# Patient Record
Sex: Female | Born: 1935 | Race: White | Hispanic: No | Smoking: Never smoker
Health system: Southern US, Community
[De-identification: ages and names within clinical notes are randomized; demographics above are authoritative.]

## PROBLEM LIST (undated history)

## (undated) DIAGNOSIS — R202 Paresthesia of skin: Secondary | ICD-10-CM

## (undated) DIAGNOSIS — I359 Nonrheumatic aortic valve disorder, unspecified: Secondary | ICD-10-CM

## (undated) DIAGNOSIS — S99929A Unspecified injury of unspecified foot, initial encounter: Secondary | ICD-10-CM

## (undated) DIAGNOSIS — Z7901 Long term (current) use of anticoagulants: Secondary | ICD-10-CM

## (undated) DIAGNOSIS — D721 Eosinophilia, unspecified: Secondary | ICD-10-CM

## (undated) DIAGNOSIS — R32 Unspecified urinary incontinence: Secondary | ICD-10-CM

## (undated) DIAGNOSIS — Z853 Personal history of malignant neoplasm of breast: Secondary | ICD-10-CM

## (undated) DIAGNOSIS — L03115 Cellulitis of right lower limb: Secondary | ICD-10-CM

## (undated) DIAGNOSIS — C50919 Malignant neoplasm of unspecified site of unspecified female breast: Secondary | ICD-10-CM

## (undated) DIAGNOSIS — M51369 Other intervertebral disc degeneration, lumbar region without mention of lumbar back pain or lower extremity pain: Secondary | ICD-10-CM

## (undated) DIAGNOSIS — K429 Umbilical hernia without obstruction or gangrene: Secondary | ICD-10-CM

## (undated) DIAGNOSIS — S72002A Fracture of unspecified part of neck of left femur, initial encounter for closed fracture: Secondary | ICD-10-CM

## (undated) DIAGNOSIS — I4891 Unspecified atrial fibrillation: Secondary | ICD-10-CM

## (undated) DIAGNOSIS — G8929 Other chronic pain: Secondary | ICD-10-CM

## (undated) DIAGNOSIS — M1710 Unilateral primary osteoarthritis, unspecified knee: Secondary | ICD-10-CM

## (undated) DIAGNOSIS — K219 Gastro-esophageal reflux disease without esophagitis: Secondary | ICD-10-CM

## (undated) DIAGNOSIS — M179 Osteoarthritis of knee, unspecified: Secondary | ICD-10-CM

## (undated) DIAGNOSIS — K8020 Calculus of gallbladder without cholecystitis without obstruction: Secondary | ICD-10-CM

## (undated) DIAGNOSIS — M5136 Other intervertebral disc degeneration, lumbar region: Secondary | ICD-10-CM

## (undated) DIAGNOSIS — E2740 Unspecified adrenocortical insufficiency: Secondary | ICD-10-CM

## (undated) DIAGNOSIS — M7120 Synovial cyst of popliteal space [Baker], unspecified knee: Secondary | ICD-10-CM

## (undated) DIAGNOSIS — I482 Chronic atrial fibrillation, unspecified: Secondary | ICD-10-CM

## (undated) DIAGNOSIS — I10 Essential (primary) hypertension: Secondary | ICD-10-CM

## (undated) DIAGNOSIS — M4850XA Collapsed vertebra, not elsewhere classified, site unspecified, initial encounter for fracture: Secondary | ICD-10-CM

## (undated) DIAGNOSIS — G4730 Sleep apnea, unspecified: Secondary | ICD-10-CM

## (undated) DIAGNOSIS — I639 Cerebral infarction, unspecified: Secondary | ICD-10-CM

## (undated) DIAGNOSIS — R55 Syncope and collapse: Secondary | ICD-10-CM

## (undated) DIAGNOSIS — K3184 Gastroparesis: Secondary | ICD-10-CM

## (undated) DIAGNOSIS — E785 Hyperlipidemia, unspecified: Secondary | ICD-10-CM

## (undated) DIAGNOSIS — K5790 Diverticulosis of intestine, part unspecified, without perforation or abscess without bleeding: Secondary | ICD-10-CM

## (undated) HISTORY — DX: Essential (primary) hypertension: I10

## (undated) HISTORY — DX: Malignant neoplasm of unspecified site of unspecified female breast: C50.919

## (undated) HISTORY — PX: BREAST SURGERY: SHX581

## (undated) HISTORY — DX: Unilateral primary osteoarthritis, unspecified knee: M17.10

## (undated) HISTORY — DX: Unspecified urinary incontinence: R32

## (undated) HISTORY — DX: Long term (current) use of anticoagulants: Z79.01

## (undated) HISTORY — PX: CARDIAC VALVE REPLACEMENT: SHX585

## (undated) HISTORY — PX: BREAST LUMPECTOMY: SHX2

## (undated) HISTORY — PX: EXPLORATION POST OPERATIVE OPEN HEART: SHX5061

## (undated) HISTORY — DX: Hyperlipidemia, unspecified: E78.5

## (undated) HISTORY — DX: Eosinophilia, unspecified: D72.10

## (undated) HISTORY — DX: Nonrheumatic aortic valve disorder, unspecified: I35.9

## (undated) HISTORY — PX: MASTECTOMY: SHX3

## (undated) HISTORY — PX: COLONOSCOPY: SHX174

## (undated) HISTORY — DX: Unspecified atrial fibrillation: I48.91

## (undated) HISTORY — DX: Sleep apnea, unspecified: G47.30

## (undated) HISTORY — DX: Gastro-esophageal reflux disease without esophagitis: K21.9

## (undated) HISTORY — DX: Osteoarthritis of knee, unspecified: M17.9

## (undated) HISTORY — DX: Eosinophilia: D72.1

## (undated) HISTORY — DX: Cerebral infarction, unspecified: I63.9

## (undated) SURGERY — EGD (ESOPHAGOGASTRODUODENOSCOPY)
Anesthesia: Moderate Sedation

---

## 1998-01-08 ENCOUNTER — Encounter: Admission: RE | Admit: 1998-01-08 | Discharge: 1998-01-08 | Payer: Self-pay | Admitting: Family Medicine

## 1998-01-11 ENCOUNTER — Encounter: Admission: RE | Admit: 1998-01-11 | Discharge: 1998-01-11 | Payer: Self-pay | Admitting: Family Medicine

## 1998-01-26 ENCOUNTER — Encounter: Admission: RE | Admit: 1998-01-26 | Discharge: 1998-01-26 | Payer: Self-pay | Admitting: Family Medicine

## 1998-01-29 ENCOUNTER — Encounter: Admission: RE | Admit: 1998-01-29 | Discharge: 1998-01-29 | Payer: Self-pay | Admitting: Family Medicine

## 1998-02-05 ENCOUNTER — Encounter: Admission: RE | Admit: 1998-02-05 | Discharge: 1998-02-05 | Payer: Self-pay | Admitting: Family Medicine

## 1998-03-01 ENCOUNTER — Encounter: Admission: RE | Admit: 1998-03-01 | Discharge: 1998-03-01 | Payer: Self-pay | Admitting: Family Medicine

## 1998-03-11 ENCOUNTER — Encounter: Admission: RE | Admit: 1998-03-11 | Discharge: 1998-03-11 | Payer: Self-pay | Admitting: Sports Medicine

## 1998-03-15 ENCOUNTER — Encounter: Admission: RE | Admit: 1998-03-15 | Discharge: 1998-03-15 | Payer: Self-pay | Admitting: Family Medicine

## 1998-04-01 ENCOUNTER — Encounter: Admission: RE | Admit: 1998-04-01 | Discharge: 1998-04-01 | Payer: Self-pay | Admitting: Family Medicine

## 1998-04-23 ENCOUNTER — Encounter: Admission: RE | Admit: 1998-04-23 | Discharge: 1998-04-23 | Payer: Self-pay | Admitting: Family Medicine

## 1998-05-07 ENCOUNTER — Encounter: Admission: RE | Admit: 1998-05-07 | Discharge: 1998-08-05 | Payer: Self-pay | Admitting: *Deleted

## 1998-05-14 ENCOUNTER — Encounter: Admission: RE | Admit: 1998-05-14 | Discharge: 1998-05-14 | Payer: Self-pay | Admitting: Family Medicine

## 1998-05-27 ENCOUNTER — Encounter: Admission: RE | Admit: 1998-05-27 | Discharge: 1998-05-27 | Payer: Self-pay | Admitting: Family Medicine

## 1998-05-28 ENCOUNTER — Encounter: Admission: RE | Admit: 1998-05-28 | Discharge: 1998-05-28 | Payer: Self-pay | Admitting: Family Medicine

## 1998-05-31 ENCOUNTER — Encounter: Admission: RE | Admit: 1998-05-31 | Discharge: 1998-05-31 | Payer: Self-pay | Admitting: Family Medicine

## 1998-06-04 ENCOUNTER — Encounter: Admission: RE | Admit: 1998-06-04 | Discharge: 1998-06-04 | Payer: Self-pay | Admitting: Family Medicine

## 1998-06-18 ENCOUNTER — Encounter: Admission: RE | Admit: 1998-06-18 | Discharge: 1998-06-18 | Payer: Self-pay | Admitting: Family Medicine

## 1998-06-29 ENCOUNTER — Encounter: Admission: RE | Admit: 1998-06-29 | Discharge: 1998-06-29 | Payer: Self-pay | Admitting: Family Medicine

## 1998-07-02 ENCOUNTER — Encounter: Admission: RE | Admit: 1998-07-02 | Discharge: 1998-07-02 | Payer: Self-pay | Admitting: Family Medicine

## 1998-07-09 ENCOUNTER — Encounter: Admission: RE | Admit: 1998-07-09 | Discharge: 1998-07-09 | Payer: Self-pay | Admitting: Family Medicine

## 1998-07-16 ENCOUNTER — Encounter: Admission: RE | Admit: 1998-07-16 | Discharge: 1998-07-16 | Payer: Self-pay | Admitting: Family Medicine

## 1998-07-19 ENCOUNTER — Encounter: Admission: RE | Admit: 1998-07-19 | Discharge: 1998-07-19 | Payer: Self-pay | Admitting: Family Medicine

## 1998-08-03 ENCOUNTER — Encounter: Admission: RE | Admit: 1998-08-03 | Discharge: 1998-08-03 | Payer: Self-pay | Admitting: Family Medicine

## 1998-08-06 ENCOUNTER — Encounter: Admission: RE | Admit: 1998-08-06 | Discharge: 1998-08-06 | Payer: Self-pay | Admitting: Family Medicine

## 1998-08-24 ENCOUNTER — Encounter: Admission: RE | Admit: 1998-08-24 | Discharge: 1998-08-24 | Payer: Self-pay | Admitting: Sports Medicine

## 1998-08-25 ENCOUNTER — Encounter: Admission: RE | Admit: 1998-08-25 | Discharge: 1998-08-25 | Payer: Self-pay | Admitting: Family Medicine

## 1998-09-27 ENCOUNTER — Encounter: Admission: RE | Admit: 1998-09-27 | Discharge: 1998-09-27 | Payer: Self-pay | Admitting: Specialist

## 1998-10-12 ENCOUNTER — Encounter: Admission: RE | Admit: 1998-10-12 | Discharge: 1998-10-12 | Payer: Self-pay | Admitting: Sports Medicine

## 1998-10-29 ENCOUNTER — Encounter: Admission: RE | Admit: 1998-10-29 | Discharge: 1998-10-29 | Payer: Self-pay | Admitting: Family Medicine

## 1998-11-15 ENCOUNTER — Encounter: Admission: RE | Admit: 1998-11-15 | Discharge: 1998-11-15 | Payer: Self-pay | Admitting: Sports Medicine

## 1998-11-29 ENCOUNTER — Encounter: Admission: RE | Admit: 1998-11-29 | Discharge: 1998-11-29 | Payer: Self-pay | Admitting: Family Medicine

## 1998-12-03 ENCOUNTER — Encounter: Admission: RE | Admit: 1998-12-03 | Discharge: 1998-12-03 | Payer: Self-pay | Admitting: Family Medicine

## 1998-12-29 ENCOUNTER — Encounter: Admission: RE | Admit: 1998-12-29 | Discharge: 1998-12-29 | Payer: Self-pay | Admitting: Family Medicine

## 1999-01-12 ENCOUNTER — Encounter: Admission: RE | Admit: 1999-01-12 | Discharge: 1999-01-12 | Payer: Self-pay | Admitting: Family Medicine

## 1999-01-17 ENCOUNTER — Encounter: Admission: RE | Admit: 1999-01-17 | Discharge: 1999-01-17 | Payer: Self-pay | Admitting: Sports Medicine

## 1999-01-24 ENCOUNTER — Encounter: Admission: RE | Admit: 1999-01-24 | Discharge: 1999-01-24 | Payer: Self-pay | Admitting: Sports Medicine

## 1999-02-02 ENCOUNTER — Encounter: Admission: RE | Admit: 1999-02-02 | Discharge: 1999-02-02 | Payer: Self-pay | Admitting: Family Medicine

## 1999-03-18 ENCOUNTER — Encounter: Admission: RE | Admit: 1999-03-18 | Discharge: 1999-03-18 | Payer: Self-pay | Admitting: Family Medicine

## 1999-04-01 ENCOUNTER — Encounter: Admission: RE | Admit: 1999-04-01 | Discharge: 1999-04-01 | Payer: Self-pay | Admitting: Family Medicine

## 1999-04-04 ENCOUNTER — Encounter: Admission: RE | Admit: 1999-04-04 | Discharge: 1999-04-04 | Payer: Self-pay | Admitting: Family Medicine

## 1999-04-11 ENCOUNTER — Encounter: Admission: RE | Admit: 1999-04-11 | Discharge: 1999-04-11 | Payer: Self-pay | Admitting: Family Medicine

## 1999-05-05 ENCOUNTER — Encounter: Admission: RE | Admit: 1999-05-05 | Discharge: 1999-05-05 | Payer: Self-pay | Admitting: Family Medicine

## 1999-05-30 ENCOUNTER — Encounter: Admission: RE | Admit: 1999-05-30 | Discharge: 1999-05-30 | Payer: Self-pay | Admitting: Family Medicine

## 1999-06-16 ENCOUNTER — Encounter: Admission: RE | Admit: 1999-06-16 | Discharge: 1999-06-16 | Payer: Self-pay | Admitting: Family Medicine

## 1999-07-01 ENCOUNTER — Encounter: Admission: RE | Admit: 1999-07-01 | Discharge: 1999-07-01 | Payer: Self-pay | Admitting: Family Medicine

## 1999-07-27 ENCOUNTER — Encounter: Admission: RE | Admit: 1999-07-27 | Discharge: 1999-07-27 | Payer: Self-pay | Admitting: Family Medicine

## 1999-08-01 ENCOUNTER — Encounter: Admission: RE | Admit: 1999-08-01 | Discharge: 1999-08-01 | Payer: Self-pay | Admitting: Family Medicine

## 2001-10-30 ENCOUNTER — Encounter: Admission: RE | Admit: 2001-10-30 | Discharge: 2001-10-30 | Payer: Self-pay | Admitting: Family Medicine

## 2001-11-13 ENCOUNTER — Encounter: Payer: Self-pay | Admitting: Sports Medicine

## 2001-11-13 ENCOUNTER — Encounter: Admission: RE | Admit: 2001-11-13 | Discharge: 2001-11-13 | Payer: Self-pay | Admitting: Family Medicine

## 2001-11-13 ENCOUNTER — Encounter: Admission: RE | Admit: 2001-11-13 | Discharge: 2001-11-13 | Payer: Self-pay | Admitting: Sports Medicine

## 2001-12-11 ENCOUNTER — Encounter: Admission: RE | Admit: 2001-12-11 | Discharge: 2001-12-11 | Payer: Self-pay | Admitting: Family Medicine

## 2001-12-17 ENCOUNTER — Encounter: Admission: RE | Admit: 2001-12-17 | Discharge: 2001-12-17 | Payer: Self-pay | Admitting: Family Medicine

## 2002-05-07 ENCOUNTER — Encounter: Admission: RE | Admit: 2002-05-07 | Discharge: 2002-05-07 | Payer: Self-pay | Admitting: Family Medicine

## 2002-11-12 ENCOUNTER — Encounter: Admission: RE | Admit: 2002-11-12 | Discharge: 2002-11-12 | Payer: Self-pay | Admitting: Family Medicine

## 2002-11-14 ENCOUNTER — Encounter: Admission: RE | Admit: 2002-11-14 | Discharge: 2002-11-14 | Payer: Self-pay | Admitting: Family Medicine

## 2002-11-21 ENCOUNTER — Encounter: Admission: RE | Admit: 2002-11-21 | Discharge: 2002-11-21 | Payer: Self-pay | Admitting: Family Medicine

## 2002-11-26 ENCOUNTER — Encounter: Admission: RE | Admit: 2002-11-26 | Discharge: 2002-11-26 | Payer: Self-pay | Admitting: Sports Medicine

## 2002-11-26 ENCOUNTER — Encounter: Admission: RE | Admit: 2002-11-26 | Discharge: 2002-11-26 | Payer: Self-pay | Admitting: Family Medicine

## 2002-11-26 ENCOUNTER — Encounter: Payer: Self-pay | Admitting: Sports Medicine

## 2002-12-03 ENCOUNTER — Encounter: Admission: RE | Admit: 2002-12-03 | Discharge: 2002-12-03 | Payer: Self-pay | Admitting: Family Medicine

## 2002-12-12 ENCOUNTER — Encounter: Admission: RE | Admit: 2002-12-12 | Discharge: 2002-12-12 | Payer: Self-pay | Admitting: Family Medicine

## 2003-01-01 ENCOUNTER — Encounter: Admission: RE | Admit: 2003-01-01 | Discharge: 2003-01-01 | Payer: Self-pay | Admitting: Family Medicine

## 2003-01-08 ENCOUNTER — Encounter: Payer: Self-pay | Admitting: Sports Medicine

## 2003-01-08 ENCOUNTER — Encounter: Admission: RE | Admit: 2003-01-08 | Discharge: 2003-01-08 | Payer: Self-pay | Admitting: *Deleted

## 2003-01-19 ENCOUNTER — Encounter: Admission: RE | Admit: 2003-01-19 | Discharge: 2003-01-19 | Payer: Self-pay | Admitting: Family Medicine

## 2003-02-13 ENCOUNTER — Encounter: Admission: RE | Admit: 2003-02-13 | Discharge: 2003-02-13 | Payer: Self-pay | Admitting: Family Medicine

## 2003-02-27 ENCOUNTER — Encounter: Admission: RE | Admit: 2003-02-27 | Discharge: 2003-02-27 | Payer: Self-pay | Admitting: Family Medicine

## 2003-03-11 ENCOUNTER — Encounter: Admission: RE | Admit: 2003-03-11 | Discharge: 2003-03-11 | Payer: Self-pay | Admitting: Family Medicine

## 2003-04-03 ENCOUNTER — Encounter: Admission: RE | Admit: 2003-04-03 | Discharge: 2003-04-03 | Payer: Self-pay | Admitting: Family Medicine

## 2003-05-19 ENCOUNTER — Encounter: Admission: RE | Admit: 2003-05-19 | Discharge: 2003-05-19 | Payer: Self-pay | Admitting: Sports Medicine

## 2003-06-02 ENCOUNTER — Encounter: Admission: RE | Admit: 2003-06-02 | Discharge: 2003-06-02 | Payer: Self-pay | Admitting: Family Medicine

## 2003-06-16 ENCOUNTER — Encounter: Admission: RE | Admit: 2003-06-16 | Discharge: 2003-06-16 | Payer: Self-pay | Admitting: Sports Medicine

## 2006-04-28 ENCOUNTER — Ambulatory Visit: Payer: Self-pay | Admitting: Family Medicine

## 2006-10-25 ENCOUNTER — Emergency Department (HOSPITAL_COMMUNITY): Admission: EM | Admit: 2006-10-25 | Discharge: 2006-10-25 | Payer: Self-pay | Admitting: Emergency Medicine

## 2006-11-19 ENCOUNTER — Ambulatory Visit (HOSPITAL_COMMUNITY): Admission: RE | Admit: 2006-11-19 | Discharge: 2006-11-19 | Payer: Self-pay | Admitting: Family Medicine

## 2006-12-03 ENCOUNTER — Encounter (INDEPENDENT_AMBULATORY_CARE_PROVIDER_SITE_OTHER): Payer: Self-pay | Admitting: *Deleted

## 2006-12-03 ENCOUNTER — Encounter: Admission: RE | Admit: 2006-12-03 | Discharge: 2006-12-03 | Payer: Self-pay | Admitting: Family Medicine

## 2006-12-03 ENCOUNTER — Encounter (INDEPENDENT_AMBULATORY_CARE_PROVIDER_SITE_OTHER): Payer: Self-pay | Admitting: Diagnostic Radiology

## 2006-12-11 ENCOUNTER — Encounter: Admission: RE | Admit: 2006-12-11 | Discharge: 2006-12-11 | Payer: Self-pay | Admitting: Family Medicine

## 2006-12-12 ENCOUNTER — Encounter (INDEPENDENT_AMBULATORY_CARE_PROVIDER_SITE_OTHER): Payer: Self-pay | Admitting: *Deleted

## 2006-12-13 ENCOUNTER — Inpatient Hospital Stay (HOSPITAL_COMMUNITY): Admission: RE | Admit: 2006-12-13 | Discharge: 2006-12-14 | Payer: Self-pay | Admitting: General Surgery

## 2007-01-09 ENCOUNTER — Ambulatory Visit (HOSPITAL_COMMUNITY): Payer: Self-pay | Admitting: Oncology

## 2007-01-10 ENCOUNTER — Ambulatory Visit: Payer: Self-pay | Admitting: Radiation Oncology

## 2007-01-23 ENCOUNTER — Ambulatory Visit: Payer: Self-pay | Admitting: Radiation Oncology

## 2007-01-29 ENCOUNTER — Ambulatory Visit: Admission: RE | Admit: 2007-01-29 | Discharge: 2007-03-11 | Payer: Self-pay | Admitting: Radiation Oncology

## 2007-01-30 ENCOUNTER — Ambulatory Visit: Payer: Self-pay | Admitting: Cardiology

## 2007-03-01 ENCOUNTER — Ambulatory Visit: Payer: Self-pay | Admitting: Cardiology

## 2007-03-05 ENCOUNTER — Emergency Department (HOSPITAL_COMMUNITY): Admission: EM | Admit: 2007-03-05 | Discharge: 2007-03-05 | Payer: Self-pay | Admitting: Emergency Medicine

## 2007-03-22 ENCOUNTER — Ambulatory Visit: Payer: Self-pay | Admitting: Cardiovascular Disease

## 2007-04-02 ENCOUNTER — Encounter (HOSPITAL_COMMUNITY): Admission: RE | Admit: 2007-04-02 | Discharge: 2007-05-02 | Payer: Self-pay | Admitting: Orthopaedic Surgery

## 2007-04-03 ENCOUNTER — Encounter (HOSPITAL_COMMUNITY): Admission: RE | Admit: 2007-04-03 | Discharge: 2007-05-03 | Payer: Self-pay | Admitting: Oncology

## 2007-04-03 ENCOUNTER — Ambulatory Visit (HOSPITAL_COMMUNITY): Payer: Self-pay | Admitting: Oncology

## 2007-04-03 LAB — CONVERTED CEMR LAB
ALT: 18 U/L
AST: 25 U/L
Albumin: 3.8 g/dL
Alkaline Phosphatase: 88 U/L
BUN: 7 mg/dL
Basophils Absolute: 0.1 K/uL
Basophils Relative: 1 %
CO2: 25 meq/L
Calcium: 9 mg/dL
Chloride: 92 meq/L
Creatinine, Ser: 0.53 mg/dL
Eosinophils Absolute: 1.4 K/uL
Eosinophils Relative: 11 %
Glucose, Bld: 117 mg/dL
HCT: 37.2 %
Hemoglobin: 12.9 g/dL
Lymphocytes Relative: 15 %
Lymphs Abs: 1.9 K/uL
MCHC: 34.7 g/dL
MCV: 83.4 fL
Monocytes Absolute: 0.7 K/uL
Monocytes Relative: 6 %
Neutro Abs: 8.7 K/uL
Neutrophils Relative %: 68 %
Platelets: 373 K/uL
Potassium: 3.4 meq/L
RBC: 4.46 M/uL
RDW: 13.5 %
Sodium: 128 meq/L
Total Bilirubin: 0.6 mg/dL
Total Protein: 6.9 g/dL
WBC: 12.7 10*3/microliter

## 2007-04-17 ENCOUNTER — Ambulatory Visit (HOSPITAL_COMMUNITY): Admission: RE | Admit: 2007-04-17 | Discharge: 2007-04-17 | Payer: Self-pay | Admitting: Oncology

## 2007-04-24 ENCOUNTER — Ambulatory Visit: Payer: Self-pay | Admitting: Cardiovascular Disease

## 2007-05-10 ENCOUNTER — Ambulatory Visit: Payer: Self-pay | Admitting: Cardiology

## 2007-05-13 ENCOUNTER — Ambulatory Visit: Payer: Self-pay | Admitting: Cardiology

## 2007-05-20 ENCOUNTER — Ambulatory Visit: Payer: Self-pay | Admitting: Cardiology

## 2007-06-05 ENCOUNTER — Ambulatory Visit: Payer: Self-pay | Admitting: Cardiology

## 2007-06-26 ENCOUNTER — Ambulatory Visit: Payer: Self-pay | Admitting: Cardiology

## 2007-07-17 ENCOUNTER — Other Ambulatory Visit: Admission: RE | Admit: 2007-07-17 | Discharge: 2007-07-17 | Payer: Self-pay | Admitting: Obstetrics & Gynecology

## 2007-08-15 ENCOUNTER — Ambulatory Visit: Payer: Self-pay | Admitting: Cardiology

## 2007-09-09 ENCOUNTER — Ambulatory Visit: Payer: Self-pay | Admitting: Cardiology

## 2007-10-09 ENCOUNTER — Ambulatory Visit: Payer: Self-pay | Admitting: Cardiology

## 2007-10-30 ENCOUNTER — Ambulatory Visit (HOSPITAL_COMMUNITY): Admission: RE | Admit: 2007-10-30 | Discharge: 2007-10-30 | Payer: Self-pay | Admitting: Cardiology

## 2007-10-30 ENCOUNTER — Encounter (INDEPENDENT_AMBULATORY_CARE_PROVIDER_SITE_OTHER): Payer: Self-pay | Admitting: Internal Medicine

## 2007-10-30 ENCOUNTER — Ambulatory Visit: Payer: Self-pay | Admitting: Cardiology

## 2007-10-31 ENCOUNTER — Ambulatory Visit: Payer: Self-pay | Admitting: Cardiology

## 2007-11-27 ENCOUNTER — Ambulatory Visit (HOSPITAL_COMMUNITY): Admission: RE | Admit: 2007-11-27 | Discharge: 2007-11-27 | Payer: Self-pay | Admitting: Family Medicine

## 2007-12-11 ENCOUNTER — Ambulatory Visit: Payer: Self-pay | Admitting: Cardiology

## 2007-12-12 ENCOUNTER — Encounter (INDEPENDENT_AMBULATORY_CARE_PROVIDER_SITE_OTHER): Payer: Self-pay | Admitting: Internal Medicine

## 2008-01-09 ENCOUNTER — Ambulatory Visit: Payer: Self-pay | Admitting: Cardiology

## 2008-01-22 ENCOUNTER — Ambulatory Visit: Payer: Self-pay | Admitting: Cardiovascular Disease

## 2008-01-29 ENCOUNTER — Ambulatory Visit: Payer: Self-pay | Admitting: Internal Medicine

## 2008-01-29 DIAGNOSIS — K219 Gastro-esophageal reflux disease without esophagitis: Secondary | ICD-10-CM | POA: Insufficient documentation

## 2008-01-29 DIAGNOSIS — E119 Type 2 diabetes mellitus without complications: Secondary | ICD-10-CM

## 2008-01-29 DIAGNOSIS — Z853 Personal history of malignant neoplasm of breast: Secondary | ICD-10-CM | POA: Insufficient documentation

## 2008-01-29 DIAGNOSIS — K279 Peptic ulcer, site unspecified, unspecified as acute or chronic, without hemorrhage or perforation: Secondary | ICD-10-CM | POA: Insufficient documentation

## 2008-01-29 DIAGNOSIS — H6120 Impacted cerumen, unspecified ear: Secondary | ICD-10-CM | POA: Insufficient documentation

## 2008-01-29 DIAGNOSIS — I10 Essential (primary) hypertension: Secondary | ICD-10-CM

## 2008-01-29 DIAGNOSIS — K5900 Constipation, unspecified: Secondary | ICD-10-CM | POA: Insufficient documentation

## 2008-01-29 DIAGNOSIS — L538 Other specified erythematous conditions: Secondary | ICD-10-CM | POA: Insufficient documentation

## 2008-01-29 DIAGNOSIS — H269 Unspecified cataract: Secondary | ICD-10-CM

## 2008-01-29 DIAGNOSIS — M179 Osteoarthritis of knee, unspecified: Secondary | ICD-10-CM

## 2008-01-29 DIAGNOSIS — I482 Chronic atrial fibrillation, unspecified: Secondary | ICD-10-CM | POA: Insufficient documentation

## 2008-01-29 DIAGNOSIS — M1710 Unilateral primary osteoarthritis, unspecified knee: Secondary | ICD-10-CM

## 2008-01-29 HISTORY — DX: Osteoarthritis of knee, unspecified: M17.9

## 2008-01-29 HISTORY — DX: Personal history of malignant neoplasm of breast: Z85.3

## 2008-01-29 HISTORY — DX: Unilateral primary osteoarthritis, unspecified knee: M17.10

## 2008-01-30 ENCOUNTER — Telehealth (INDEPENDENT_AMBULATORY_CARE_PROVIDER_SITE_OTHER): Payer: Self-pay | Admitting: *Deleted

## 2008-02-03 ENCOUNTER — Encounter (INDEPENDENT_AMBULATORY_CARE_PROVIDER_SITE_OTHER): Payer: Self-pay | Admitting: Internal Medicine

## 2008-02-19 ENCOUNTER — Ambulatory Visit: Payer: Self-pay | Admitting: Internal Medicine

## 2008-02-19 LAB — CONVERTED CEMR LAB: Prothrombin Time: 21.7 s

## 2008-02-26 ENCOUNTER — Encounter (INDEPENDENT_AMBULATORY_CARE_PROVIDER_SITE_OTHER): Payer: Self-pay | Admitting: *Deleted

## 2008-02-26 ENCOUNTER — Ambulatory Visit: Payer: Self-pay | Admitting: Internal Medicine

## 2008-02-26 DIAGNOSIS — E785 Hyperlipidemia, unspecified: Secondary | ICD-10-CM

## 2008-02-26 LAB — CONVERTED CEMR LAB
ALT: 15 units/L
ALT: 15 units/L (ref 0–35)
AST: 18 units/L
AST: 18 units/L (ref 0–37)
Albumin: 4.5 g/dL
Alkaline Phosphatase: 69 units/L
BUN: 10 mg/dL
BUN: 10 mg/dL (ref 6–23)
Basophils Relative: 0 % (ref 0–1)
Calcium: 9 mg/dL (ref 8.4–10.5)
Chloride: 96 meq/L
Cholesterol: 116 mg/dL (ref 0–200)
Creatinine, Ser: 0.59 mg/dL (ref 0.40–1.20)
Eosinophils Absolute: 1.8 10*3/uL — ABNORMAL HIGH (ref 0.0–0.7)
Glucose, Bld: 97 mg/dL (ref 70–99)
HCT: 37 % (ref 36.0–46.0)
HDL: 38 mg/dL
HDL: 38 mg/dL — ABNORMAL LOW (ref >39)
Hemoglobin: 12.5 g/dL
LDL Cholesterol: 40 mg/dL (ref 0–99)
Lymphocytes Relative: 23 % (ref 12–46)
MCHC: 33.8 g/dL
MCHC: 33.8 g/dL (ref 30.0–36.0)
Monocytes Absolute: 0.9 10*3/uL
Monocytes Relative: 8 %
Neutro Abs: 5.8 10*3/uL (ref 1.7–7.7)
Neutrophils Relative %: 52 % (ref 43–77)
Platelets: 390 10*3/uL (ref 150–400)
Potassium: 4.2 meq/L
RBC: 4.26 M/uL
RBC: 4.26 M/uL (ref 3.87–5.11)
RDW: 13.539 %
Sodium: 134 meq/L
Sodium: 134 meq/L — ABNORMAL LOW (ref 135–145)
Total Bilirubin: 0.3 mg/dL (ref 0.3–1.2)
Total Protein: 7.6 g/dL (ref 6.0–8.3)
Triglycerides: 192 mg/dL
Triglycerides: 192 mg/dL — ABNORMAL HIGH (ref <150)
VLDL: 38 mg/dL (ref 0–40)
WBC: 11.2 10*3/uL

## 2008-02-27 ENCOUNTER — Encounter (INDEPENDENT_AMBULATORY_CARE_PROVIDER_SITE_OTHER): Payer: Self-pay | Admitting: Internal Medicine

## 2008-02-27 ENCOUNTER — Telehealth (INDEPENDENT_AMBULATORY_CARE_PROVIDER_SITE_OTHER): Payer: Self-pay | Admitting: *Deleted

## 2008-03-03 DIAGNOSIS — D721 Eosinophilia: Secondary | ICD-10-CM

## 2008-03-04 ENCOUNTER — Ambulatory Visit (HOSPITAL_COMMUNITY): Admission: RE | Admit: 2008-03-04 | Discharge: 2008-03-04 | Payer: Self-pay | Admitting: Internal Medicine

## 2008-03-06 ENCOUNTER — Encounter (INDEPENDENT_AMBULATORY_CARE_PROVIDER_SITE_OTHER): Payer: Self-pay | Admitting: Internal Medicine

## 2008-03-18 ENCOUNTER — Ambulatory Visit: Payer: Self-pay | Admitting: Internal Medicine

## 2008-04-15 ENCOUNTER — Ambulatory Visit: Payer: Self-pay | Admitting: Family Medicine

## 2008-04-29 ENCOUNTER — Ambulatory Visit: Payer: Self-pay | Admitting: Internal Medicine

## 2008-04-29 ENCOUNTER — Telehealth (INDEPENDENT_AMBULATORY_CARE_PROVIDER_SITE_OTHER): Payer: Self-pay | Admitting: *Deleted

## 2008-04-30 ENCOUNTER — Encounter (INDEPENDENT_AMBULATORY_CARE_PROVIDER_SITE_OTHER): Payer: Self-pay | Admitting: Internal Medicine

## 2008-05-07 ENCOUNTER — Ambulatory Visit (HOSPITAL_COMMUNITY): Payer: Self-pay | Admitting: Oncology

## 2008-05-18 ENCOUNTER — Encounter (INDEPENDENT_AMBULATORY_CARE_PROVIDER_SITE_OTHER): Payer: Self-pay | Admitting: Internal Medicine

## 2008-05-27 ENCOUNTER — Ambulatory Visit: Payer: Self-pay | Admitting: Internal Medicine

## 2008-05-27 ENCOUNTER — Ambulatory Visit (HOSPITAL_COMMUNITY): Admission: RE | Admit: 2008-05-27 | Discharge: 2008-05-27 | Payer: Self-pay | Admitting: Internal Medicine

## 2008-05-27 DIAGNOSIS — M545 Low back pain, unspecified: Secondary | ICD-10-CM | POA: Insufficient documentation

## 2008-05-27 LAB — CONVERTED CEMR LAB: INR: 2.8

## 2008-06-01 ENCOUNTER — Emergency Department: Payer: Self-pay

## 2008-06-01 ENCOUNTER — Telehealth (INDEPENDENT_AMBULATORY_CARE_PROVIDER_SITE_OTHER): Payer: Self-pay | Admitting: *Deleted

## 2008-06-01 ENCOUNTER — Encounter (INDEPENDENT_AMBULATORY_CARE_PROVIDER_SITE_OTHER): Payer: Self-pay | Admitting: Internal Medicine

## 2008-06-02 ENCOUNTER — Telehealth (INDEPENDENT_AMBULATORY_CARE_PROVIDER_SITE_OTHER): Payer: Self-pay | Admitting: Internal Medicine

## 2008-06-03 ENCOUNTER — Ambulatory Visit: Payer: Self-pay | Admitting: Internal Medicine

## 2008-06-16 ENCOUNTER — Telehealth (INDEPENDENT_AMBULATORY_CARE_PROVIDER_SITE_OTHER): Payer: Self-pay | Admitting: *Deleted

## 2008-06-19 ENCOUNTER — Telehealth (INDEPENDENT_AMBULATORY_CARE_PROVIDER_SITE_OTHER): Payer: Self-pay | Admitting: *Deleted

## 2008-06-24 ENCOUNTER — Encounter (INDEPENDENT_AMBULATORY_CARE_PROVIDER_SITE_OTHER): Payer: Self-pay | Admitting: Internal Medicine

## 2008-07-02 ENCOUNTER — Encounter (INDEPENDENT_AMBULATORY_CARE_PROVIDER_SITE_OTHER): Payer: Self-pay | Admitting: Internal Medicine

## 2008-07-15 ENCOUNTER — Ambulatory Visit: Payer: Self-pay | Admitting: Internal Medicine

## 2008-07-15 LAB — CONVERTED CEMR LAB: Blood Glucose, Fingerstick: 84

## 2008-07-27 ENCOUNTER — Ambulatory Visit: Payer: Self-pay | Admitting: Internal Medicine

## 2008-07-27 DIAGNOSIS — J209 Acute bronchitis, unspecified: Secondary | ICD-10-CM

## 2008-07-31 ENCOUNTER — Ambulatory Visit: Payer: Self-pay | Admitting: Internal Medicine

## 2008-07-31 LAB — CONVERTED CEMR LAB: INR: 1.7

## 2008-08-08 ENCOUNTER — Inpatient Hospital Stay: Payer: Self-pay | Admitting: Internal Medicine

## 2008-08-19 ENCOUNTER — Encounter (INDEPENDENT_AMBULATORY_CARE_PROVIDER_SITE_OTHER): Payer: Self-pay | Admitting: Internal Medicine

## 2008-09-11 ENCOUNTER — Ambulatory Visit: Payer: Self-pay | Admitting: Internal Medicine

## 2008-10-29 ENCOUNTER — Encounter (INDEPENDENT_AMBULATORY_CARE_PROVIDER_SITE_OTHER): Payer: Self-pay | Admitting: Internal Medicine

## 2008-10-30 ENCOUNTER — Encounter (INDEPENDENT_AMBULATORY_CARE_PROVIDER_SITE_OTHER): Payer: Self-pay | Admitting: Internal Medicine

## 2008-11-19 ENCOUNTER — Inpatient Hospital Stay: Payer: Self-pay | Admitting: Internal Medicine

## 2008-11-20 ENCOUNTER — Ambulatory Visit: Payer: Self-pay | Admitting: Cardiology

## 2008-12-06 ENCOUNTER — Emergency Department: Payer: Self-pay | Admitting: Emergency Medicine

## 2008-12-29 ENCOUNTER — Emergency Department: Payer: Self-pay | Admitting: Emergency Medicine

## 2008-12-31 ENCOUNTER — Emergency Department: Payer: Self-pay

## 2009-01-08 ENCOUNTER — Emergency Department: Payer: Self-pay | Admitting: Emergency Medicine

## 2009-02-12 ENCOUNTER — Encounter (INDEPENDENT_AMBULATORY_CARE_PROVIDER_SITE_OTHER): Payer: Self-pay | Admitting: Internal Medicine

## 2009-05-12 ENCOUNTER — Ambulatory Visit: Payer: Self-pay | Admitting: Internal Medicine

## 2009-05-31 ENCOUNTER — Ambulatory Visit: Payer: Self-pay | Admitting: Internal Medicine

## 2009-12-22 ENCOUNTER — Ambulatory Visit (HOSPITAL_COMMUNITY): Admission: RE | Admit: 2009-12-22 | Discharge: 2009-12-22 | Payer: Self-pay | Admitting: Family Medicine

## 2010-04-05 ENCOUNTER — Ambulatory Visit (HOSPITAL_COMMUNITY): Admission: RE | Admit: 2010-04-05 | Discharge: 2010-04-05 | Payer: Self-pay | Admitting: Family Medicine

## 2010-04-06 ENCOUNTER — Emergency Department (HOSPITAL_COMMUNITY): Admission: EM | Admit: 2010-04-06 | Discharge: 2010-04-06 | Payer: Self-pay | Admitting: Emergency Medicine

## 2010-04-12 ENCOUNTER — Emergency Department (HOSPITAL_COMMUNITY): Admission: EM | Admit: 2010-04-12 | Discharge: 2010-04-12 | Payer: Self-pay | Admitting: Emergency Medicine

## 2010-04-12 ENCOUNTER — Encounter: Payer: Self-pay | Admitting: Orthopedic Surgery

## 2010-04-18 ENCOUNTER — Ambulatory Visit: Payer: Self-pay | Admitting: Orthopedic Surgery

## 2010-04-18 DIAGNOSIS — S93529A Sprain of metatarsophalangeal joint of unspecified toe(s), initial encounter: Secondary | ICD-10-CM | POA: Insufficient documentation

## 2010-04-20 ENCOUNTER — Encounter (INDEPENDENT_AMBULATORY_CARE_PROVIDER_SITE_OTHER): Payer: Self-pay | Admitting: *Deleted

## 2010-04-20 ENCOUNTER — Encounter: Payer: Self-pay | Admitting: Orthopedic Surgery

## 2010-04-20 LAB — CONVERTED CEMR LAB
Alkaline Phosphatase: 78 units/L
BUN: 7 mg/dL
Basophils Absolute: 0 10*3/uL
Basophils Relative: 1 %
CO2: 27 meq/L
Chloride: 102 meq/L
Creatinine, Ser: 0.54 mg/dL
Eosinophils Relative: 8 %
HCT: 36.6 %
HDL: 32 mg/dL
Hemoglobin: 11.2 g/dL
Hgb A1c MFr Bld: 5.7 %
Lymphocytes Relative: 25 %
MCV: 82.8 fL
Monocytes Absolute: 0.7 10*3/uL
Potassium: 4.2 meq/L
Sodium: 141 meq/L
TSH: 2.352 microintl units/mL
Triglycerides: 156 mg/dL
WBC: 7.7 10*3/uL

## 2010-07-14 ENCOUNTER — Encounter (INDEPENDENT_AMBULATORY_CARE_PROVIDER_SITE_OTHER): Payer: Self-pay | Admitting: *Deleted

## 2010-07-22 ENCOUNTER — Encounter (INDEPENDENT_AMBULATORY_CARE_PROVIDER_SITE_OTHER): Payer: Self-pay | Admitting: *Deleted

## 2010-09-05 ENCOUNTER — Inpatient Hospital Stay: Payer: Self-pay | Admitting: Internal Medicine

## 2010-10-23 ENCOUNTER — Encounter: Payer: Self-pay | Admitting: Cardiology

## 2010-10-23 ENCOUNTER — Encounter: Payer: Self-pay | Admitting: Internal Medicine

## 2010-10-23 ENCOUNTER — Encounter: Payer: Self-pay | Admitting: Orthopaedic Surgery

## 2010-11-01 NOTE — Letter (Signed)
Summary: History form  History form   Imported By: Jacklynn Ganong 04/21/2010 10:40:57  _____________________________________________________________________  External Attachment:    Type:   Image     Comment:   External Document

## 2010-11-01 NOTE — Letter (Signed)
Summary: Appointment - Missed  [facility]  [address] [place] [address]   Phone: [phone]  Fax: [phone]     July 22, 2010 MRN: [id number]   Kristin Logan [address] [place], [place]  [address]   Dear [name],  Our records indicate you missed your appointment on       07/22/10                 with Dr. Dietrich Pates      It is very important that we reach you to reschedule this appointment. We look forward to participating in your health care needs. Please contact us at the number listed above at your earliest convenience to reschedule this appointment.     Sincerely,    Glass blower/designer

## 2010-11-01 NOTE — Letter (Signed)
Summary: Office notes fax Dr Hilda Lias per patient request  Office notes fax Dr Hilda Lias per patient request   Imported By: Cammie Sickle 04/21/2010 08:44:44  _____________________________________________________________________  External Attachment:    Type:   Image     Comment:   External Document

## 2010-11-01 NOTE — Miscellaneous (Signed)
Summary: chest xray,04/06/2010,echo 10/31/2007  Clinical Lists Changes  Observations: Added new observation of CXR RESULTS:  Clinical Data: Bilateral feet swelling; history of heart disease.    PORTABLE CHEST - 1 VIEW    Comparison: Chest radiograph performed 03/04/2008    Findings: The lungs are well-aerated.  Mildly increased   interstitial markings are noted, with vascular congestion, possibly   reflecting mild interstitial edema.  There is no evidence of   pleural effusion or pneumothorax.    There is globular enlargement of the cardiomediastinal silhouette,   significantly increased in size from the prior study; sternotomy   wires are noted just to the right of midline, at the level of the   superior mediastinum.  No acute osseous abnormalities are seen.    IMPRESSION:    1.  Globular enlargement of the heart, new from the prior study.   2.  Mildly increased interstitial markings, with vascular   congestion, possibly reflecting mild interstitial edema.    Read By:  Tonia Ghent,  M.D.   Released By:  Tonia Ghent,  M.D.  (04/06/2010 9:45) Added new observation of ECHOINTERP:   ECHOCARDIOGRAM      CLINICAL DATA:  A 77 year old woman with aortic valve disease.      M-mode aorta 3.3, left atrium 5.9, septum 1.6, posterior wall 1.7, LV   diastole 5.1, LV systole 4.0.   1. Technically suboptimal but adequate echocardiographic study.   2. Moderate left atrial enlargement; mild to moderate right atrial       enlargement.   3. Normal right ventricular size and function; mild RVH.   4. Moderate to marked sclerosis of the aortic valve leaflets with       moderate to marked impairment in leaflet separation.  Doppler       suggests only mild stenosis with peak gradient of 30 - 35 mmHg.       There is also mild insufficiency.   5. Normal proximal ascending aorta.   6. Normal mitral valve; mild to moderate annular calcification.   7. Normal tricuspid and pulmonic valve; normal  proximal pulmonary       artery.   8. Left ventricular size at the upper limit of normal; mild to       moderate hypertrophy; no segmental wall motion abnormality       appreciated; overall LV systolic function is low normal.   9. Normal IVC.               Gerrit Friends. Dietrich Pates, MD, [facility]   Electronically Signed  (10/31/2007 9:45)      CXR  Procedure date:  04/06/2010  Findings:       Clinical Data: Bilateral feet swelling; history of heart disease.    PORTABLE CHEST - 1 VIEW    Comparison: Chest radiograph performed 03/04/2008    Findings: The lungs are well-aerated.  Mildly increased   interstitial markings are noted, with vascular congestion, possibly   reflecting mild interstitial edema.  There is no evidence of   pleural effusion or pneumothorax.    There is globular enlargement of the cardiomediastinal silhouette,   significantly increased in size from the prior study; sternotomy   wires are noted just to the right of midline, at the level of the   superior mediastinum.  No acute osseous abnormalities are seen.    IMPRESSION:    1.  Globular enlargement of the heart, new from the prior study.   2.  Mildly increased interstitial markings,  with vascular   congestion, possibly reflecting mild interstitial edema.    Read By:  Tonia Ghent,  M.D.   Released By:  Tonia Ghent,  M.D.   Echocardiogram  Procedure date:  10/31/2007  Findings:        ECHOCARDIOGRAM      CLINICAL DATA:  A 77 year old woman with aortic valve disease.      M-mode aorta 3.3, left atrium 5.9, septum 1.6, posterior wall 1.7, LV   diastole 5.1, LV systole 4.0.   1. Technically suboptimal but adequate echocardiographic study.   2. Moderate left atrial enlargement; mild to moderate right atrial       enlargement.   3. Normal right ventricular size and function; mild RVH.   4. Moderate to marked sclerosis of the aortic valve leaflets with       moderate to marked impairment in leaflet  separation.  Doppler       suggests only mild stenosis with peak gradient of 30 - 35 mmHg.       There is also mild insufficiency.   5. Normal proximal ascending aorta.   6. Normal mitral valve; mild to moderate annular calcification.   7. Normal tricuspid and pulmonic valve; normal proximal pulmonary       artery.   8. Left ventricular size at the upper limit of normal; mild to       moderate hypertrophy; no segmental wall motion abnormality       appreciated; overall LV systolic function is low normal.   9. Normal IVC.               Gerrit Friends. Dietrich Pates, MD, [facility]   Electronically Signed

## 2010-11-01 NOTE — Miscellaneous (Signed)
Summary: labs cbcd,cmp,lipids,tsh,t4,04/20/2010  Clinical Lists Changes  Observations: Added new observation of CALCIUM: 9.0 mg/dL (10/04/7251 66:44) Added new observation of ALBUMIN: 4.0 g/dL (03/47/4259 56:38) Added new observation of PROTEIN, TOT: 7.0 g/dL (75/64/3329 51:88) Added new observation of SGPT (ALT): 9 units/L (04/20/2010 10:38) Added new observation of SGOT (AST): 16 units/L (04/20/2010 10:38) Added new observation of ALK PHOS: 78 units/L (04/20/2010 10:38) Added new observation of CREATININE: 0.54 mg/dL (41/66/0630 16:01) Added new observation of BUN: 7 mg/dL (09/32/3557 32:20) Added new observation of BG RANDOM: 111 mg/dL (25/42/7062 37:62) Added new observation of CO2 PLSM/SER: 27 meq/L (04/20/2010 10:38) Added new observation of CL SERUM: 102 meq/L (04/20/2010 10:38) Added new observation of K SERUM: 4.2 meq/L (04/20/2010 10:38) Added new observation of NA: 141 meq/L (04/20/2010 10:38) Added new observation of LDL: 44 mg/dL (83/15/1761 60:73) Added new observation of HDL: 32 mg/dL (71/03/2693 85:46) Added new observation of TRIGLYC TOT: 156 mg/dL (27/12/5007 38:18) Added new observation of CHOLESTEROL: 107 mg/dL (29/93/7169 67:89) Added new observation of TSH: 2.352 microintl units/mL (04/20/2010 10:38) Added new observation of T4, FREE: 9.7 ng/dL (38/07/1750 02:58) Added new observation of HGBA1C: 5.7 % (04/20/2010 10:38) Added new observation of ABSOLUTE BAS: 0.0 K/uL (04/20/2010 10:38) Added new observation of BASOPHIL %: 1 % (04/20/2010 10:38) Added new observation of EOS ABSLT: 0.6 K/uL (04/20/2010 10:38) Added new observation of % EOS AUTO: 8 % (04/20/2010 10:38) Added new observation of ABSOLUTE MON: 0.7 K/uL (04/20/2010 10:38) Added new observation of MONOCYTE %: 9 % (04/20/2010 10:38) Added new observation of ABS LYMPHOCY: 1.9 K/uL (04/20/2010 10:38) Added new observation of LYMPHS %: 25 % (04/20/2010 10:38) Added new observation of PLATELETK/UL: 335 K/uL  (04/20/2010 10:38) Added new observation of RDW: 14.8 % (04/20/2010 10:38) Added new observation of MCHC RBC: 30.6 g/dL (52/77/8242 35:36) Added new observation of MCV: 82.8 fL (04/20/2010 10:38) Added new observation of HCT: 36.6 % (04/20/2010 10:38) Added new observation of HGB: 11.2 g/dL (14/43/1540 08:67) Added new observation of RBC M/UL: 4.42 M/uL (04/20/2010 10:38) Added new observation of WBC COUNT: 7.7 10*3/microliter (04/20/2010 10:38)

## 2010-11-01 NOTE — Miscellaneous (Signed)
Summary: labs cbcd,cmp,lipids,02/26/2008  Clinical Lists Changes  Observations: Added new observation of CALCIUM: 9.0 mg/dL (16/07/9603 5:40) Added new observation of ALBUMIN: 4.5 g/dL (98/08/9146 8:29) Added new observation of PROTEIN, TOT: 7.6 g/dL (56/21/3086 5:78) Added new observation of SGPT (ALT): 15 units/L (02/26/2008 9:39) Added new observation of SGOT (AST): 18 units/L (02/26/2008 9:39) Added new observation of ALK PHOS: 69 units/L (02/26/2008 9:39) Added new observation of CREATININE: 0.59 mg/dL (46/96/2952 8:41) Added new observation of BUN: 10 mg/dL (32/44/0102 7:25) Added new observation of BG RANDOM: 97 mg/dL (36/64/4034 7:42) Added new observation of CO2 PLSM/SER: 24 meq/L (02/26/2008 9:39) Added new observation of CL SERUM: 96 meq/L (02/26/2008 9:39) Added new observation of K SERUM: 4.2 meq/L (02/26/2008 9:39) Added new observation of NA: 134 meq/L (02/26/2008 9:39) Added new observation of LDL: 40 mg/dL (59/56/3875 6:43) Added new observation of HDL: 38 mg/dL (32/95/1884 1:66) Added new observation of TRIGLYC TOT: 192 mg/dL (03/31/1600 0:93) Added new observation of CHOLESTEROL: 116 mg/dL (23/55/7322 0:25) Added new observation of ABSOLUTE BAS: 0.1 K/uL (02/26/2008 9:39) Added new observation of BASOPHIL %: 0 % (02/26/2008 9:39) Added new observation of EOS ABSLT: 1.8 K/uL (02/26/2008 9:39) Added new observation of % EOS AUTO: 17 % (02/26/2008 9:39) Added new observation of ABSOLUTE MON: 0.9 K/uL (02/26/2008 9:39) Added new observation of MONOCYTE %: 8 % (02/26/2008 9:39) Added new observation of ABS LYMPHOCY: 2.6 K/uL (02/26/2008 9:39) Added new observation of LYMPHS %: 23 % (02/26/2008 9:39) Added new observation of RDW: 13.5390 % (02/26/2008 9:39) Added new observation of MCHC RBC: 33.8 g/dL (42/70/6237 6:28) Added new observation of MCV: 86.9 fL (02/26/2008 9:39) Added new observation of HCT: 37.0 % (02/26/2008 9:39) Added new observation of HGB: 12.5 g/dL  (31/51/7616 0:73) Added new observation of RBC M/UL: 4.26 M/uL (02/26/2008 9:39) Added new observation of WBC COUNT: 11.2 10*3/microliter (02/26/2008 9:39)

## 2010-11-01 NOTE — Assessment & Plan Note (Signed)
Summary: [facility] FOL/UP/FX LT FOOT/XR [facility] 04/13/10/MEDICARE,MCD/CAF   Visit Type:  new patient Referring Provider:  [facility]   History of Present Illness: I saw Kristin Logan in the office today for an initial visit.  She is a 77 years old woman with the complaint of:  left foot fracture.  [facility] ER on 04-12-10. Xrays taken. She was given Oxycodone.  DOI 04-12-10.  Patient states that she stumped her toe.  She complains of pain in the metatarsophalangeal joints of the second third and fourth digits of LEFT foot with throbbing and burning an 8/10 pain which is constant since the injury on July 12  She has tingling and swelling has a history of diabetic feet with bilateral foot pain.  She says her doctors denied her diabetic shoes    Allergies (verified): 1)  ! Codeine  Past History:  Past Surgical History: right breast lumpectomy and axillary dissection-12/12/06 Open heart  Review of Systems Constitutional:  Denies weight loss, weight gain, fever, chills, and fatigue. Cardiovascular:  Denies chest pain, palpitations, fainting, and murmurs. Respiratory:  Complains of short of breath and wheezing; denies couch, tightness, pain on inspiration, and snoring . Gastrointestinal:  Denies heartburn, nausea, vomiting, diarrhea, constipation, and blood in your stools. Genitourinary:  Denies frequency, urgency, difficulty urinating, painful urination, flank pain, and bleeding in urine. Neurologic:  Complains of tingling; denies numbness, unsteady gait, dizziness, tremors, and seizure. Musculoskeletal:  Complains of swelling; denies joint pain, instability, stiffness, redness, heat, and muscle pain. Endocrine:  Denies excessive thirst, exessive urination, and heat or cold intolerance. Psychiatric:  Denies nervousness, depression, anxiety, and hallucinations. Skin:  Denies changes in the skin, poor healing, rash, itching, and redness. HEENT:  Complains of watering; denies blurred or double vision, eye  pain, and redness. Immunology:  Denies seasonal allergies, sinus problems, and allergic to bee stings. Hemoatologic:  Complains of brusing; denies easy bleeding.  Physical Exam  Msk:  The patient is well developed and nourished, with normal grooming and hygiene. The body habitus is  Medium large Pulses:  normal perfusion in the LEFT foot Extremities:  no deformity in the LEFT foot ankle range of motion is normal she is tender in the metatarsophalangeal joints nontender at the presumed fracture sites noted on x-ray he seemed to be chronic changes to me and I do not see a fracture line  Ankle joint itself is stable Neurologic:  normal pain and position sense in the LEFT foot Skin:  slightly reddened skin from the proximal tibia down into the foot seems to be chronic stasis type disease there no ulcerations there is some ecchymosis in the second and third digits of the LEFT foot Psych:  alert and cooperative; normal mood and affect; normal attention span and concentration   Impression & Recommendations:  Problem # 1:  FOOT SPRAIN, METATARSOPHALANGEAL (ZOX-096.04) Assessment New  after reviewing the x-rays and the report I think she sprained her metatarsophalangeal joints recommend postop shoe 4 weeks follow up as needed  Orders: New Patient Level II (54098)  Patient Instructions: 1)  wear post op shoe x 4 weeks  2)  f/u as needed

## 2010-12-13 ENCOUNTER — Emergency Department (HOSPITAL_COMMUNITY): Payer: Medicare Other

## 2010-12-13 ENCOUNTER — Emergency Department (HOSPITAL_COMMUNITY)
Admission: EM | Admit: 2010-12-13 | Discharge: 2010-12-14 | Disposition: A | Discharge status: Home / Self Care | Payer: Medicare Other | Attending: Emergency Medicine | Admitting: Emergency Medicine

## 2010-12-13 DIAGNOSIS — Z853 Personal history of malignant neoplasm of breast: Secondary | ICD-10-CM | POA: Insufficient documentation

## 2010-12-13 DIAGNOSIS — I10 Essential (primary) hypertension: Secondary | ICD-10-CM | POA: Insufficient documentation

## 2010-12-13 DIAGNOSIS — R062 Wheezing: Secondary | ICD-10-CM | POA: Insufficient documentation

## 2010-12-13 DIAGNOSIS — E119 Type 2 diabetes mellitus without complications: Secondary | ICD-10-CM | POA: Insufficient documentation

## 2010-12-13 DIAGNOSIS — R079 Chest pain, unspecified: Secondary | ICD-10-CM | POA: Insufficient documentation

## 2010-12-13 DIAGNOSIS — I2510 Atherosclerotic heart disease of native coronary artery without angina pectoris: Secondary | ICD-10-CM | POA: Insufficient documentation

## 2010-12-13 DIAGNOSIS — I4891 Unspecified atrial fibrillation: Secondary | ICD-10-CM | POA: Insufficient documentation

## 2010-12-13 LAB — CBC
HCT: 34.1 % — ABNORMAL LOW (ref 36.0–46.0)
MCHC: 33.1 g/dL (ref 30.0–36.0)
MCV: 83.4 fL (ref 78.0–100.0)
Platelets: 285 10*3/uL (ref 150–400)
RDW: 14.3 % (ref 11.5–15.5)

## 2010-12-13 LAB — BASIC METABOLIC PANEL
BUN: 12 mg/dL (ref 6–23)
CO2: 26 mEq/L (ref 19–32)
Calcium: 8.9 mg/dL (ref 8.4–10.5)
Chloride: 102 mEq/L (ref 96–112)
Creatinine, Ser: 0.67 mg/dL (ref 0.4–1.2)

## 2010-12-13 LAB — DIFFERENTIAL
Lymphocytes Relative: 38 % (ref 12–46)
Lymphs Abs: 4.9 10*3/uL — ABNORMAL HIGH (ref 0.7–4.0)
Monocytes Relative: 9 % (ref 3–12)
Neutro Abs: 5.7 10*3/uL (ref 1.7–7.7)
Neutrophils Relative %: 45 % (ref 43–77)

## 2010-12-13 LAB — POCT CARDIAC MARKERS: Troponin i, poc: <0.05 ng/mL (ref 0.00–0.09)

## 2010-12-14 LAB — URINALYSIS, ROUTINE W REFLEX MICROSCOPIC
Bilirubin Urine: NEGATIVE
Glucose, UA: NEGATIVE mg/dL
Hgb urine dipstick: NEGATIVE
Nitrite: NEGATIVE
Specific Gravity, Urine: <1.005 — ABNORMAL LOW (ref 1.005–1.030)
pH: 7 (ref 5.0–8.0)

## 2010-12-14 LAB — URINE MICROSCOPIC-ADD ON

## 2010-12-18 LAB — POCT CARDIAC MARKERS
CKMB, poc: <1 ng/mL — ABNORMAL LOW (ref 1.0–8.0)
Myoglobin, poc: 53.1 ng/mL (ref 12–200)

## 2010-12-18 LAB — BASIC METABOLIC PANEL
BUN: 9 mg/dL (ref 6–23)
Creatinine, Ser: 0.51 mg/dL (ref 0.4–1.2)
GFR calc non Af Amer: >60 mL/min (ref >60)
Glucose, Bld: 156 mg/dL — ABNORMAL HIGH (ref 70–99)
Potassium: 3.5 mEq/L (ref 3.5–5.1)

## 2011-01-23 ENCOUNTER — Other Ambulatory Visit: Payer: Self-pay | Admitting: Oncology

## 2011-01-23 DIAGNOSIS — Z9889 Other specified postprocedural states: Secondary | ICD-10-CM

## 2011-02-01 ENCOUNTER — Ambulatory Visit
Admission: RE | Admit: 2011-02-01 | Discharge: 2011-02-01 | Disposition: A | Discharge status: Home / Self Care | Payer: Medicare Other | Source: Ambulatory Visit | Attending: Oncology | Admitting: Oncology

## 2011-02-01 DIAGNOSIS — Z9889 Other specified postprocedural states: Secondary | ICD-10-CM

## 2011-02-14 NOTE — Letter (Signed)
October 30, 2007    Kristin Logan. Sudie Bailey, M.D.  [address]  [place], [place] [address]   RE:  CHARLOT, GOUIN  MRN:  [id number]  /  DOB:  03-12-1932   Dear [name],   [name] returns to the office after multiple cancelled and missed  visits.  Fortunately, she has been doing quite well.  She reports no  lightheadedness, no syncope, no chest pain, and no dyspnea.  She has had  bilateral knee pain that is improved after injections performed by Dr.  Hilda Lias.  Control of hypertension, anticoagulation, and diabetes has  been good.  A recent lipid profile was good.  She appears quite  disorganized and forgetful, but says that she takes her medicine fairly  reliably.  Control of anticoagulation has been mildly suboptimal, but  would tend to bear her out.   Her other medications include:  1. Nexium 40 mg daily.  2. Lisinopril 20 mg b.i.d.  3. Simvastatin 20 mg daily.  4. Prozac 20 mg daily.  5. Diltiazem 240 mg daily.  6. Arimidex 1 mg daily.  7. Metformin 1000 mg b.i.d.  8. Metanx 1 daily.  9. HCTZ 12.5 mg daily.  10.Calcium 1 daily.  11.Multivitamin.   She never returned for her echocardiography and carotid ultrasound  appointments.  Follow-up chemistry profile was obtained in June 2008,  but was not reviewed until today.  Her serum sodium at that time was  127.  Other electrolytes and renal function were normal.  Her most  recent lipid profile shows a total cholesterol of 127, triglycerides of  220, HDL 36, and LDL 47.   IMPRESSION:  [name] is doing well, with good control of  cardiovascular risk factors and reasonable anticoagulation.  We will  attempt to obtain her echocardiogram and carotid ultrasound as  previously scheduled.  We will repeat a chemistry profile.  If she  continues to have significant hyponatremia, it will probably be  necessary to discontinue HCTZ.  [name] will continue to be  followed in anticoagulation clinic.  I will  see her again in 9 months.  Influenza vaccine was obtained this year.  Stool for Hemoccult testing  was negative.    Sincerely,      Gerrit Friends. Dietrich Pates, MD, [facility]  Electronically Signed    RMR/MedQ  DD: 10/30/2007  DT: 10/31/2007  Job #: [id number]

## 2011-02-17 NOTE — Group Therapy Note (Signed)
NAMEALIZON, Kristin Logan NO.:  [id number]   MEDICAL RECORD NO.:  [id number]          PATIENT TYPE:  INP   LOCATION:  A319                          FACILITY:  [facility]   PHYSICIAN:  Mila Homer. Sudie Bailey, M.D.DATE OF BIRTH:  [birth year]   DATE OF PROCEDURE:  DATE OF DISCHARGE:                                 PROGRESS NOTE   She says she is feeling a lot better than yesterday.  Her daughter is  with her and has great concerns the patient will not be able to ambulate  given her right axillary node dissection and her severe DJD of knees,  which she says is bone on bone.  Apparently the patient has been able  to ambulate at home using both arms for support.   OBJECTIVE:  Temperature is 98.2, pulse 79, respiratory rate  22, blood  pressure 112/60.  She is sitting up in bed, appears to be happy. She is eating some.  Color is good.  She is well-developed and obese.  She is no acute  distress and is oriented and alert.  The lungs are clear throughout.  The heart has an irregular irregularity rate about 70.  There is no edema of the ankles.  She had severe tenderness in the base of the right thumb on palpation.   Her white cell count today is 13,500, H&H 11.5/34.1,  eosinophil count  17%, up from 13% yesterday.  Her sodium is 130, chloride 95, with  yesterday's 129 and 92.  An INR 1.1  Glucose's today were 113, 128 and 127.   ASSESSMENT:  1. Malignant tumor of the right breast.  2. Chronic atrial fibrillation.  3. Anticoagulation.  4. Severe degenerative joint disease of the knees.  5. Type 2 diabetes.  6. Benign essential hypertension.  7. 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   SUBJECTIVE:  The patient complains of a lot of pain in the base of the  right thumb.  In fact, this is bothering her more than anything.   PLAN:  Add Tylenol 1000 mg p.o. q.i.d. for pain or fever.  Start on  Lovenox today, 40 mg sub Q. She is to have another dose of __________  10 mg  now.  We will ask PT to evaluate her for ambulation and social  service to arrange for short-term nursing home placement, given all of  the above.      Mila Homer. Sudie Bailey, M.D.  Electronically Signed     SDK/MEDQ  D:  12/13/2006  T:  12/14/2006  Job:  [id number]

## 2011-02-17 NOTE — Consult Note (Signed)
Kristin Logan, Kristin Logan             ACCOUNT NO.:  [id number]   MEDICAL RECORD NO.:  [id number]          PATIENT TYPE:  INP   LOCATION:  A319                          FACILITY:  [facility]   PHYSICIAN:  J. Darreld Mclean, M.D. DATE OF BIRTH:  [birth year]   DATE OF CONSULTATION:  DATE OF DISCHARGE:                                 CONSULTATION   CONSULTATION   Consultation is requested by Dr. Malvin Johns.   The patient has a history of DJD in both knees.  I have seen her in the  office and injected her knees.  It has been about a year since I have  seen her at least.  She has recently had breast surgery by Dr. Malvin Johns.  The patient and the family has requested I see the patient while she is  here in the hospital to inject her knees for her significant  degenerative joint disease which has been longstanding.  She had very  good results from the last time I injected the knees.   Both knees have slight effusion and crepitus.  She is in bed and prefers  not to stand or walk.  The knees appear to be stable.   IMPRESSION:  DJD both knees.   OBJECTIVE:  Both knees bilateral approach using 1% Xylocaine and 1 cc of  Depo-Medrol 40 to each knee.  She tolerated the procedure well.   Follow up with her in the office as needed.           ______________________________  [name]. Darreld Mclean, M.D.     JWK/MEDQ  D:  12/13/2006  T:  12/14/2006  Job:  [id number]

## 2011-02-17 NOTE — Discharge Summary (Signed)
NAMELYNDSEE, CASA NO.:  [id number]   MEDICAL RECORD NO.:  [id number]          PATIENT TYPE:  INP   LOCATION:  A319                          FACILITY:  [facility]   PHYSICIAN:  Mila Homer. Sudie Bailey, M.D.DATE OF BIRTH:  03-Jul-1932   DATE OF ADMISSION:  12/12/2006  DATE OF DISCHARGE:  03/14/2008LH                               DISCHARGE SUMMARY   A 77 year old who was admitted to hospital for surgical removal of a  right breast cancer.  She had a benign 3-day hospitalization extending  from December 12, 2006, to December 14, 2006.  Vital signs remained stable.   She was admitted to the hospital in the morning of March 12 and had a  lumpectomy performed of the right breast.  She had sentinel node biopsy  and then exploration of the right axilla, all done by Dr. Malvin Johns, her  surgeon.  In the hospital she was continued on Lexapro 10 mg daily,  Starlix 120 mg t.i.d. daily, lisinopril 40 mg b.i.d.,  hydrochlorothiazide 25 mg daily, diltiazem ER 240 mg daily, Nexium  (Protonix) 40 mg daily, metformin 1000 mg q.h.s., Ativan 0.5 mg q.h.s.  She was given Dilaudid 1 mg IV q.4h. p.r.n. pain and Zofran 4 mg IV  q.8h. p.r.n.  She was on a regular diet, initially on observation.   It was noted the second day because of the axillary dissection that she  had a lot of trouble moving her right arm and since she has severe DJD  of the knees and has to ambulate home with a wheeled walker with a  chair, that she really could not be discharged then.  However, Dr.  Malvin Johns arranged for to see her orthopedist, Dr. Hilda Lias, who injected  both knees so that by the third day the knees were much improved and she  was able to actually ambulate without a walker.  She was ready for  discharge home at that time with pretty good range of motion of the  right arm despite her axillary dissection and fairly good ambulation.   Prior to hospitalization she had a white cell count 11,300; H&H  13.4/39.3.   Her platelet count had been 449,000.  Recheck white cell  count was 13,000; H&H 11.5/34.1; platelet count 378,000.  Her BMET  showed a sodium 130, chloride 92, glucose 118.  Recheck sodium was 129,  chloride 92, glucose 124.  Third recheck showed sodium 130, chloride 95.  It was felt that these electrolyte abnormalities were secondary to her  hydrochlorothiazide.  INR 2 days prior to surgery was 1.1 and the day of  surgery was 1.0.  Her second hospital day it was 1.1 and third was 1.3.   The patient also received Lovenox 40 mg subcu daily starting the day  after her surgery, which had been March 13 and March 14.  She was also  given Coumadin 10 mg the day of surgery, 10 mg the following day, and  7.5 her third day.   FINAL DISCHARGE DIAGNOSES:  1. Malignancy of the right breast status post lumpectomy and right      axillary dissection.  2. Chronic atrial fibrillation.  3. Anticoagulation.  4. Severe incapacitating degenerative joint disease of both knees.  5. Depression.  6. Electrolyte abnormalities secondary to hydrochlorothiazide.  7. Benign essential hypertension.   She is being discharged home today.  She will be on Coumadin as directed  to her daughter, so I can tell her daughter directly about this in the  office over the weekend, and she will be rechecked in 3 days the office  for PT/INR.  Meanwhile, she will be on Lovenox 40 mg subcu which she  already has at home.  She will resume all of her other preoperative  medications which were dictated into the initial consult note but  include diltiazem ER 240 mg daily, Nexium 40 mg daily, metformin 1000 mg  b.i.d., hydrochlorothiazide 25 mg daily, lisinopril 20 mg b.i.d.,  Lexapro 10 mg daily, and clonazepam 0.5 mg q.h.s.  She is to bring all  medications with her to the office in 3 days.      Mila Homer. Sudie Bailey, M.D.  Electronically Signed     SDK/MEDQ  D:  12/14/2006  T:  12/15/2006  Job:  [id number]   cc:   Barbaraann Barthel, M.D.  Fax: [phone]

## 2011-02-17 NOTE — Op Note (Signed)
NAMELIZZETE, GOUGH NO.:  [id number]   MEDICAL RECORD NO.:  [id number]          PATIENT TYPE:  INP   LOCATION:  A319                          FACILITY:  [facility]   PHYSICIAN:  Barbaraann Barthel, M.D. DATE OF BIRTH:  14-Oct-1931   DATE OF PROCEDURE:  12/12/2006  DATE OF DISCHARGE:  12/14/2006                               OPERATIVE REPORT   The dictation has been delayed because there were problems with the  transcription service and the machine dictating.   Diagnosis: Adeno carcinoma of right breast  Procedure:  Wide excision and right axillary lymphadenectomy   NOTE:  This is a 77 year old white female who was sent to me by Dr.  Sudie Bailey who was noted to have, by needle biopsy, an invasive carcinoma  of the right breast.  We discussed surgery with this patient in detail  discussing what type of surgery she would like to have and she opted for  a sentinel node biopsy and sentinel node biopsy.  We also discussed the  need for possible lymphadenectomy if the sentinel node were positive.  All questions were answered and the patient was taken to surgery.  We  prepped her right hemithorax with Betadine solution and draped in the  usual manner, allowing draping and prepping, as well, the right upper  extremity.  We used the [employer] device, as the patient had been  injected with radioactive material two hours prior to this, and we  injected the area around where the mass was noted and we found counts  elevated in the 700 to 1200 just above the nipple, whereas going towards  the axilla, these counts diminished into the low 100s.  The area that  was marked as the tumor was, I believe, incorrectly marked and the tumor  was actually more lateral to this. I had biopsied this and sent this as  a specimen.  This, in fact, was the tumor and there was a close margin  on the lateral aspect and we did a wider margin on this to make sure  that the margins were free.  These  were reported as free margins by the  pathologist, I had marked on the second wide excision the superior  medial aspect of this lateral margin that was questionable for closeness  to the tumor. This was removed. This was fairly close to the axilla.  I  was not happy with the findings of the sentinel node area and this was  not anywhere close enough to the axilla. So, I made an incision in the  axilla extending the lateral excision up further vertically on the  lateral aspect in order to do a lymphadenectomy in this area.  We did  find a blue lymph node amongst the axillary lymph tissue, these were all  sent as a separate specimen in formalin.  We discussed this with the  pathologist intraoperatively.  We then closed the first sentinel node  biopsy site subcuticularly above the nipple with subcuticular 4-0  Polysorb suture.  I irrigated the axilla with sterile water and closed  the subcutaneous layer with 3-0 Polysorb.  I sutured a Jackson-Pratt  drain that had been placed in the  axilla and exited through a separate stab wound with 3-0 nylon suture  and closed this incision with a stapling device.  Prior to closure, all  sponge, needle and instrument counts were found to be correct.  Estimated blood loss was minimal.  The patient was replaced with  crystalloids.  There were no complications.      Barbaraann Barthel, M.D.  Electronically Signed     WB/MEDQ  D:  12/13/2006  T:  12/14/2006  Job:  [id number]   cc:   Mila Homer. Sudie Bailey, M.D.  Fax: [phone]

## 2011-02-17 NOTE — Procedures (Signed)
NAMEJASMYNE, LODATO NO.:  [id number]   MEDICAL RECORD NO.:  [id number]          PATIENT TYPE:  OUT   LOCATION:  RAD                           FACILITY:  [facility]   PHYSICIAN:  Gerrit Friends. Dietrich Pates, MD, FACCDATE OF BIRTH:  06/11/1932   DATE OF PROCEDURE:  DATE OF DISCHARGE:                                ECHOCARDIOGRAM   CLINICAL DATA:  A 77 year old woman with aortic valve disease.   M-mode aorta 3.3, left atrium 5.9, septum 1.6, posterior wall 1.7, LV  diastole 5.1, LV systole 4.0.  1. Technically suboptimal but adequate echocardiographic study.  2. Moderate left atrial enlargement; mild to moderate right atrial      enlargement.  3. Normal right ventricular size and function; mild RVH.  4. Moderate to marked sclerosis of the aortic valve leaflets with      moderate to marked impairment in leaflet separation.  Doppler      suggests only mild stenosis with peak gradient of 30 - 35 mmHg.      There is also mild insufficiency.  5. Normal proximal ascending aorta.  6. Normal mitral valve; mild to moderate annular calcification.  7. Normal tricuspid and pulmonic valve; normal proximal pulmonary      artery.  8. Left ventricular size at the upper limit of normal; mild to      moderate hypertrophy; no segmental wall motion abnormality      appreciated; overall LV systolic function is low normal.  9. Normal IVC.      Gerrit Friends. Dietrich Pates, MD, [facility]  Electronically Signed     RMR/MEDQ  D:  10/31/2007  T:  10/31/2007  Job:  [id number]

## 2011-02-17 NOTE — H&P (Signed)
[name], Logan NO.:  [id number]   MEDICAL RECORD NO.:  [id number]          PATIENT TYPE:  INP   LOCATION:  A319                          FACILITY:  [facility]   PHYSICIAN:  Mila Homer. Sudie Bailey, M.D.DATE OF BIRTH:  1931/10/23   DATE OF ADMISSION:  12/12/2006  DATE OF DISCHARGE:  LH                              HISTORY & PHYSICAL   REFERRING PHYSICIAN:  Barbaraann Barthel, M.D.   HISTORY OF PRESENT ILLNESS:  This 77 year old woman seen in the first  time 1 month ago in the office, November 13, 2006, for review of medical  problems and medications.  Medical problems include atrial fibrillation,  Coumadin anticoagulation, essential hypertension, diabetes, reflux  esophagitis, morbid obesity, and depression.  We continued her on her  medications of Lexapro 10 mg daily, lisinopril 40 mg half a tablet  b.i.d., hydrochlorothiazide 25 mg daily, diltiazem ER 240 mg daily,  calcium 600 b.i.d., multivitamins daily, Nexium 40 mg daily, metformin  500 mg b.i.d., clonazepam 0.5 mg nightly and Coumadin 5 mg daily with  Coumadin 1 mg two daily for total 7 mg daily.  Of note, she did receive  1 dose of Lovenox 40 mg subcu 2 days prior to her surgery, but none the  day prior to her surgery.  As part of her workup, a mammogram was  arranged, which showed a breast cancer of the right breast, later  confirmed on manual examination in the right lateral breast.  Today,  this was removed by a Dr. Aggie Moats, general surgeon in town; at the  same time, due to the size of the tumor (maximum length was 3 cm) he did  a sentinel node biopsy, but also a limited right axillary dissection.   The patient's Coumadin was stopped 6 days ago.  Her INR was confirmed as  being down to 1.1 two days ago and 1 today.   Today, she is recovering up on the floor.   PHYSICAL EXAMINATION:  VITAL SIGNS:  Temperature is 99.2, pulse 75,  respiratory rate 20, blood pressure 135/65.  HEART:  Irregular  irregularity, rate of about 70.  LUNGS:  Clear throughout.  NEUROLOGIC:  The patient herself appears to be oriented and alert, but a  little less alert than usual after having some IV pain medications.  Sentence structure is intact and her words are not slurred.  HEENT:  Mucous membranes appear normal.  NECK:  There is no anterior cervical adenopathy.  There is no  supraclavicular adenopathy.  ABDOMEN:  Soft without hepatosplenomegaly or mass, or tenderness.  EXTREMITIES:  There is no edema of the distal legs, but there is a  brilliant discoloration of the distal legs.   LABORATORY AND ACCESSORY CLINICAL DATA:  Blood work today showed a white  cell count of 11,300, H&H 13.4 and 39.3 and the sodium was 129, chloride  92, glucose 124.   The biopsy done in [place] last week showed invasive mammary  carcinoma.  The size of the lesion measured by mammogram was 2.5 x 1.8 x  1.5 cm.   ADMISSION DIAGNOSES:  1. Malignancy of the right  breast.  2. Benign essential hypertension.  3. Chronic atrial fibrillation.  4. Warfarin anticoagulation.  5. Diabetes mellitus, controlled on oral medications.  6. Reflux esophagitis.  7. Morbid obesity.  8. Depression.   PLAN:  In the hospital, we will continue her on her antihypertensives,  namely lisinopril 20 mg b.i.d., hydrochlorothiazide 25 mg daily, and  diltiazem ER 240 mg daily.  We will also use Lexapro 10 mg daily,  clonazepam 0.5 mg nightly and start her on Lovenox 40 mg subcu daily in  the morning.  She will receive Coumadin 10 mg now and have daily PT/INRs  and she will be on was sensitive sliding-scale insulin based on p.c. and  h.s. Accu-Cheks.   Before the surgery, I discussed everything in detail with the patient  and her daughter.  There is a small window when she is not  anticoagulated, but it was felt by the general surgeon and myself that  it would be safer to wait at least a day after surgery before starting  back on  anticoagulation, which we have planned.  She may be able to go  home tomorrow, depending on how she fells, and then we will be treating  her with Lovenox daily at home and following her INR and discontinuing  her Lovenox when her INR is between 2 and 3.      Mila Homer. Sudie Bailey, M.D.  Electronically Signed     SDK/MEDQ  D:  12/12/2006  T:  12/14/2006  Job:  [id number]

## 2011-02-17 NOTE — Letter (Signed)
January 30, 2007    Kristin Logan. Kristin Logan, M.D.  [address]  [place], [place] [address]   RE:  Kristin Logan, Kristin Logan  MRN:  [id number]  /  DOB:  [birth year]   Dear Kristin Logan:   It was my pleasure evaluating Kristin Logan in consultation in the office  today at your request.  As you know, this woman has had longstanding  atrial fibrillation, previously managed by Dr. Myrtis Ser.  She subsequently  left this area for [place], but has now returned, and requires  continuing Cardiology care.  She has been maintained on warfarin therapy  with dosage adjustment recently from your office.  She has had  experience with fingerstick INRs, and asked whether we perform that  test, and whether we can take over the management of Coumadin dosing.   She recently was hospitalized for excision of a right breast tumor that  proved to be carcinoma.  Radiation therapy is planned, but has not yet  been initiated.  She is to receive hormonal therapy, but not  chemotherapy.   The patient has done well from a cardiovascular standpoint.  She is  chronic class II-III dyspnea on exertion.  She notes occasional atypical  chest discomfort.  She had a negative stress test quite a few years ago.  She has never undergone coronary angiography.   She has been told of a murmur in the past.  Her last echocardiogram was  performed in 2003.  It did show some mild aortic sclerosis and mild  aortic insufficiency.  There was no LVH with normal left ventricular  systolic function.   PAST MEDICAL HISTORY:  Notable for:  1. Hypertension.  2. Diabetes.  3. Depression.  4. DJD of the knees.  5. GERD.   RECENT MEDICATIONS:  Include:  1. Hydrochlorothiazide 25 mg daily.  2. Starlix 120 mg t.i.d.  3. Lexapro 10 mg b.i.d.  4. Metformin 1000 mg b.i.d.  5. Lisinopril 20 mg daily.  6. Calcium supplement.  7. Multivitamin.  8. She uses clonazepam p.r.n.   THERE ARE NO KNOWN DRUG ALLERGIES.   SOCIAL HISTORY:  Retired.  Sedentary  lifestyle.  Divorced with 6  children.   FAMILY HISTORY:  Markedly positive for multiple siblings with myocardial  infarction.  Mother also suffered MI at an early age.  Father had  hypertension and cerebral vascular disease.   REVIEW OF SYSTEMS:  Notable for occasional headaches, rare episodes of  syncope, dyspnea mild to moderate exertion, some longstanding heart rate  irregularity.  She had 6 pregnancies with normal vaginal deliveries.  She is postmenopausal.  She has had some questionable rhinitis and  asthma.  All other systems are reviewed, and are negative.   EXAMINATION:  Pleasant, overweight woman, in no acute distress.  The weight is 258.  Blood pressure 140/75.  Heart rate 75 and regular.  Respirations 16.  HEENT:  Anicteric sclerae.  Normal lids and conjunctivae.  Normal oral  mucosa.  NECK:  No jugular venous distention.  Normal carotid upstrokes with soft  bilateral bruits versus transmitted murmur.  THORAX:  Lateral incision over the right breast is well healed.  There  is erythema over the most of the anterior surface with mild warmth.  No  fluctuance.  No masses.  No tenderness, and no discharge.  LUNGS:  Clear.  CARDIAC:  Normal 1st heart sound.  Slightly decreased aortic component  of the 2nd heart sound.  Grade 2/6 to 3/6 basilar systolic ejection  murmur.  Normal  PMI.  ABDOMEN:  Soft and non-tender.  Normal bowel sounds.  No masses.  No  organomegaly.  EXTREMITIES:  One half plus ankle edema.  Distal pulses intact.  NEUROMUSCULAR:  Symmetric strength and tone.  Normal cranial nerves.  MUSCULOSKELETAL:  No joint deformities.  ENDOCRINE:  No thyromegaly.  Hematopoietic.  No adenopathy.   EKG:  Atrial fibrillation with controlled ventricular response.  Prominent Q wave.  Left axis.  Lateral ST segment depression consistent  with ischemia or LVH.  Delayed R wave progression, cannot exclude  previous anterior myocardial infarction.   IMPRESSION:  Kristin Logan is  doing generally well.  Due to the presence  of a significant murmur, her echocardiogram will be repeated.  She  probably has very mild aortic stenosis at present.  She has bilateral  carotid bruits versus transmitted murmurs.  Carotid ultrasound will be  performed.  She has diabetes with significant cardiovascular risk.  Simvastatin will be added to her medical regime.  At the request of the  patient and her family, we will assume responsibility for warfarin  dosing with your permission.  Stool for Hemoccult testing has been  requested, as well as a metabolic profile in 2 months.  If results of  testing are acceptable, I will plan to see this nice woman again in 6  months.  Thanks for sending her to me.    Sincerely,      Gerrit Friends. Dietrich Pates, MD, [facility]  Electronically Signed    RMR/MedQ  DD: 01/30/2007  DT: 01/30/2007  Job #: [id number]

## 2011-05-10 ENCOUNTER — Encounter: Payer: Self-pay | Admitting: Family Medicine

## 2011-05-11 ENCOUNTER — Ambulatory Visit (INDEPENDENT_AMBULATORY_CARE_PROVIDER_SITE_OTHER): Payer: Medicare Other | Admitting: Family Medicine

## 2011-05-11 ENCOUNTER — Encounter: Payer: Self-pay | Admitting: Family Medicine

## 2011-05-11 VITALS — BP 126/60 | HR 74 | Ht 65.5 in | Wt 262.1 lb

## 2011-05-11 DIAGNOSIS — I10 Essential (primary) hypertension: Secondary | ICD-10-CM

## 2011-05-11 DIAGNOSIS — E119 Type 2 diabetes mellitus without complications: Secondary | ICD-10-CM

## 2011-05-11 DIAGNOSIS — K5900 Constipation, unspecified: Secondary | ICD-10-CM

## 2011-05-11 DIAGNOSIS — I4891 Unspecified atrial fibrillation: Secondary | ICD-10-CM

## 2011-05-11 LAB — PROTIME-INR: INR: 2.02 — ABNORMAL HIGH (ref <1.50)

## 2011-05-11 MED ORDER — METOPROLOL SUCCINATE ER 50 MG PO TB24: ORAL_TABLET | ORAL | Status: DC

## 2011-05-11 MED ORDER — ANASTROZOLE 1 MG PO TABS: 1.0000 mg | ORAL_TABLET | Freq: Every day | ORAL | Status: DC

## 2011-05-11 MED ORDER — FUROSEMIDE 40 MG PO TABS: 40.0000 mg | ORAL_TABLET | Freq: Every day | ORAL | Status: DC

## 2011-05-11 MED ORDER — LISINOPRIL 40 MG PO TABS: 40.0000 mg | ORAL_TABLET | Freq: Every day | ORAL | Status: DC

## 2011-05-11 MED ORDER — OMEPRAZOLE 20 MG PO CPDR: 20.0000 mg | DELAYED_RELEASE_CAPSULE | Freq: Every day | ORAL | Status: DC

## 2011-05-11 MED ORDER — POTASSIUM CHLORIDE CRYS ER 20 MEQ PO TBCR: 20.0000 meq | EXTENDED_RELEASE_TABLET | Freq: Every day | ORAL | Status: DC

## 2011-05-11 MED ORDER — GLIMEPIRIDE 1 MG PO TABS: 1.0000 mg | ORAL_TABLET | Freq: Every day | ORAL | Status: DC

## 2011-05-11 MED ORDER — WARFARIN SODIUM 5 MG PO TABS: 5.0000 mg | ORAL_TABLET | Freq: Every day | ORAL | Status: DC

## 2011-05-11 MED ORDER — AMLODIPINE BESYLATE 5 MG PO TABS: ORAL_TABLET | ORAL | Status: DC

## 2011-05-11 MED ORDER — SIMVASTATIN 40 MG PO TABS: 40.0000 mg | ORAL_TABLET | Freq: Every day | ORAL | Status: DC

## 2011-05-11 MED ORDER — POLYETHYLENE GLYCOL 3350 17 GM/SCOOP PO POWD: ORAL | Status: DC

## 2011-05-11 MED ORDER — CITALOPRAM HYDROBROMIDE 40 MG PO TABS: 40.0000 mg | ORAL_TABLET | Freq: Every day | ORAL | Status: DC

## 2011-05-11 NOTE — Patient Instructions (Signed)
I will get records from your previous doctor as well as labs Use the Mirlax for your constipation Continue your current medications Check your blood sugar- Fasting twice a week [facility] [place]- Aug 15th at 10am  I will call with your lab results  Next appt in four weeks

## 2011-05-11 NOTE — Progress Notes (Signed)
  Subjective:    Patient ID: Kristin Logan, female    DOB: 04/28/1932, 77 y.o.   MRN: [id number]  HPI patient here to establish care. Note the history and medications were reviewed. Patient concern today are her INR level, tingling in feet headaches, and her constipation. Needs medications refilled today Dr. Valente David ([facility] -[place], [place]) -- Internist- previous PCP Previously had a cardiologist -none recently  DM- history of diabetes for greater than 15 years. Patient's blood sugars run from low as 59-110's. She is maintained on oral medications.  A fib - diagnosed a few years ago, coumadin level has not been checked. She takes 5 mg Monday through Saturday and 10 mg on Sunday.  DJD, needs knee replacements- gets steroid injections, Dr. Hilda Lias   Constipation- chronic constipation. She was previously on MiraLAX but was unable to afford this. She would like something for her bowels today if possible MiraLAX again now she has insurance. She will return to address her other issues.  Review of Systems     Objective:   Physical Exam GEN- NAD, alert and oriented x3, Rolling Walker with chair, obese HEENT- PERRL, EOMI, non injected sclera, pink conjunctiva, MMM, oropharynx clear Neck- Supple, no thryomegaly, no carotid bruit CVS- RRR, 2/6 SEM RESP-CTAB EXT- Trace edema bilat Pulses- Radial, DP- 2+        Assessment & Plan:

## 2011-05-12 ENCOUNTER — Encounter: Payer: Self-pay | Admitting: Family Medicine

## 2011-05-12 NOTE — Assessment & Plan Note (Signed)
Patient has good control no changes to medications.

## 2011-05-12 NOTE — Assessment & Plan Note (Signed)
-   MiraLAX - daily

## 2011-05-12 NOTE — Assessment & Plan Note (Signed)
Patient will continue current medications. I will obtain her records from her previous PCP she states that blood work has been done recently.

## 2011-05-12 NOTE — Assessment & Plan Note (Signed)
She is currently in sinus rhythm. We'll obtain protime.

## 2011-05-17 ENCOUNTER — Encounter: Payer: Medicare Other | Admitting: *Deleted

## 2011-05-17 ENCOUNTER — Ambulatory Visit (INDEPENDENT_AMBULATORY_CARE_PROVIDER_SITE_OTHER): Payer: Medicare Other | Admitting: *Deleted

## 2011-05-17 DIAGNOSIS — I4891 Unspecified atrial fibrillation: Secondary | ICD-10-CM

## 2011-05-17 DIAGNOSIS — Z7901 Long term (current) use of anticoagulants: Secondary | ICD-10-CM

## 2011-06-01 ENCOUNTER — Telehealth: Payer: Self-pay | Admitting: Family Medicine

## 2011-06-01 MED ORDER — WARFARIN SODIUM 5 MG PO TABS: 5.0000 mg | ORAL_TABLET | Freq: Every day | ORAL | Status: DC

## 2011-06-01 NOTE — Telephone Encounter (Signed)
Refilled for 30 days to [facility] in [place], [place] Please let pt know

## 2011-06-01 NOTE — Telephone Encounter (Signed)
Returned call, patient unavailable.

## 2011-06-06 ENCOUNTER — Encounter: Payer: Self-pay | Admitting: Cardiology

## 2011-06-07 ENCOUNTER — Ambulatory Visit: Payer: Medicare Other | Admitting: Cardiology

## 2011-06-07 ENCOUNTER — Encounter: Payer: Medicare Other | Admitting: *Deleted

## 2011-06-08 ENCOUNTER — Encounter: Payer: Self-pay | Admitting: Family Medicine

## 2011-06-08 ENCOUNTER — Ambulatory Visit (INDEPENDENT_AMBULATORY_CARE_PROVIDER_SITE_OTHER): Payer: Medicare Other | Admitting: Family Medicine

## 2011-06-08 VITALS — BP 130/60 | HR 86 | Resp 16 | Ht 65.5 in | Wt 258.1 lb

## 2011-06-08 DIAGNOSIS — J069 Acute upper respiratory infection, unspecified: Secondary | ICD-10-CM

## 2011-06-08 DIAGNOSIS — Z7901 Long term (current) use of anticoagulants: Secondary | ICD-10-CM

## 2011-06-08 DIAGNOSIS — E119 Type 2 diabetes mellitus without complications: Secondary | ICD-10-CM

## 2011-06-08 DIAGNOSIS — I10 Essential (primary) hypertension: Secondary | ICD-10-CM

## 2011-06-08 DIAGNOSIS — E785 Hyperlipidemia, unspecified: Secondary | ICD-10-CM

## 2011-06-08 MED ORDER — BENZONATATE 100 MG PO CAPS: 100.0000 mg | ORAL_CAPSULE | Freq: Three times a day (TID) | ORAL | Status: DC | PRN

## 2011-06-08 NOTE — Assessment & Plan Note (Signed)
Check Protime, continue current dose of Coumadin

## 2011-06-08 NOTE — Assessment & Plan Note (Signed)
Obtain A1C, continue meds, pt to take CBG more regulary

## 2011-06-08 NOTE — Patient Instructions (Addendum)
I will draw your Coumadin level Call the heart doctor about your next appt will be Continue your current coumadin dosing Take the cough medication three times a day as needed Plenty of rest I will check your diabetes A1C, bad cholesterol, and electrolytes.  You can schedule the with the doctor to have the skin tag removed, if you need a referral let me know Follow-up in  4 weeks

## 2011-06-08 NOTE — Assessment & Plan Note (Signed)
Good control, goal < 130/80 as long as she tolerates it

## 2011-06-08 NOTE — Progress Notes (Signed)
  Subjective:    Patient ID: Kristin Logan, female    DOB: [birth year], 77 y.o.   MRN: [id number]  HPI  Productive cough x 2 weeks, not feeling well while away for sons funeral  ROS- +sick contacts, used cough drops, +ear pain, +sore throat , no fever, +chills- denies CP, feels better now, has cough but improved. No history of lung disease   Son passed from Lung cancer- she is using family and religion as a support system, does not feel very depressed.   A fib- missed coumadin appt secondary to funeral, needs Protime checked   DM- has not taken CBG recently, taking diabetes medication as prescribed  Arthritis - sees Dr.Keeling, will make a f/u to have knees injected Skin tag- has lesion growing on eyelid- she knows a Careers adviser in town that will remove it, she wants to call and schedule an appt   Review of Systems per above        Objective:   Physical Exam  GEN- NAD, alert and oriented x3, Rolling Walker with chair, obese HEENT- PERRL, EOMI, non injected sclera, pink conjunctiva, MMM, oropharynx - mild erythema, TM clear bilat Neck- Supple, no thryomegaly,  CVS- RRR, 2/6 SEM RESP-CTAB, no wheeze EXT- 1+ edema bilat Skin-- skin tag over the left eyelid       Assessment & Plan:

## 2011-06-08 NOTE — Assessment & Plan Note (Signed)
No fever, no lung disease, she is already improving, will give Tessalon, if she deteriorates, treat with antibiotics

## 2011-06-09 LAB — HEMOGLOBIN A1C
Hgb A1c MFr Bld: 5.9 % — ABNORMAL HIGH (ref <5.7)
Mean Plasma Glucose: 123 mg/dL — ABNORMAL HIGH (ref <117)

## 2011-06-09 LAB — LDL CHOLESTEROL, DIRECT: Direct LDL: 58 mg/dL

## 2011-06-09 LAB — COMPREHENSIVE METABOLIC PANEL
ALT: 12 U/L (ref 0–35)
AST: 23 U/L (ref 0–37)
Alkaline Phosphatase: 79 U/L (ref 39–117)
Calcium: 9.4 mg/dL (ref 8.4–10.5)
Chloride: 99 mEq/L (ref 96–112)
Creat: 0.65 mg/dL (ref 0.50–1.10)
Total Bilirubin: 0.3 mg/dL (ref 0.3–1.2)

## 2011-06-09 LAB — CBC WITH DIFFERENTIAL/PLATELET
Basophils Absolute: 0.1 10*3/uL (ref 0.0–0.1)
Basophils Relative: 1 % (ref 0–1)
Eosinophils Absolute: 1.7 10*3/uL — ABNORMAL HIGH (ref 0.0–0.7)
Eosinophils Relative: 15 % — ABNORMAL HIGH (ref 0–5)
HCT: 38.7 % (ref 36.0–46.0)
MCH: 28.5 pg (ref 26.0–34.0)
MCHC: 33.3 g/dL (ref 30.0–36.0)
MCV: 85.6 fL (ref 78.0–100.0)
Monocytes Absolute: 1 10*3/uL (ref 0.1–1.0)
Monocytes Relative: 8 % (ref 3–12)
RDW: 14 % (ref 11.5–15.5)

## 2011-06-26 ENCOUNTER — Ambulatory Visit (INDEPENDENT_AMBULATORY_CARE_PROVIDER_SITE_OTHER): Payer: Medicare Other | Admitting: *Deleted

## 2011-06-26 ENCOUNTER — Ambulatory Visit (INDEPENDENT_AMBULATORY_CARE_PROVIDER_SITE_OTHER): Payer: Medicare Other | Admitting: Cardiology

## 2011-06-26 ENCOUNTER — Encounter: Payer: Self-pay | Admitting: Cardiology

## 2011-06-26 DIAGNOSIS — Z853 Personal history of malignant neoplasm of breast: Secondary | ICD-10-CM

## 2011-06-26 DIAGNOSIS — Z7901 Long term (current) use of anticoagulants: Secondary | ICD-10-CM

## 2011-06-26 DIAGNOSIS — K219 Gastro-esophageal reflux disease without esophagitis: Secondary | ICD-10-CM

## 2011-06-26 DIAGNOSIS — D721 Eosinophilia, unspecified: Secondary | ICD-10-CM

## 2011-06-26 DIAGNOSIS — I4891 Unspecified atrial fibrillation: Secondary | ICD-10-CM

## 2011-06-26 DIAGNOSIS — E119 Type 2 diabetes mellitus without complications: Secondary | ICD-10-CM

## 2011-06-26 DIAGNOSIS — E785 Hyperlipidemia, unspecified: Secondary | ICD-10-CM

## 2011-06-26 DIAGNOSIS — I10 Essential (primary) hypertension: Secondary | ICD-10-CM

## 2011-06-26 DIAGNOSIS — G4730 Sleep apnea, unspecified: Secondary | ICD-10-CM | POA: Insufficient documentation

## 2011-06-26 DIAGNOSIS — I359 Nonrheumatic aortic valve disorder, unspecified: Secondary | ICD-10-CM

## 2011-06-26 NOTE — Assessment & Plan Note (Signed)
The patient reports CBGs frequently above 200 in recent months whereas values were typically 150 in the past.  Despite this, a recent hemoglobin A1c level was extremely low.  [name] is prescribing appropriate diabetic therapy and monitoring results.

## 2011-06-26 NOTE — Assessment & Plan Note (Signed)
Simvastatin is currently being administered at a dose considered too high by the FDA to be coadministered with amlodipine.  In light of her very low cholesterol values and absence of known vascular disease, dose will be decreased to 20 mg per day.

## 2011-06-26 NOTE — Assessment & Plan Note (Signed)
CPAP utilized with excellent relief of symptoms.

## 2011-06-26 NOTE — Patient Instructions (Addendum)
Your physician recommends that you return for lab work in: 6 months  Your physician has recommended you make the following change in your medication: decrease Simvastatin to 20 mg at bedtime  Your physician recommends that you complete 3 hemoccult cards, please follow instruction included in envelope  Your physician recommends that you schedule a follow-up appointment in: 9 months

## 2011-06-26 NOTE — Assessment & Plan Note (Signed)
Heart rate control is excellent with current medication, which will be continued.  Anticoagulation will likely be permanent unless patient develops a contraindication.  Consideration can be given to switching to a thrombin or factor X antagonist

## 2011-06-26 NOTE — Assessment & Plan Note (Signed)
INR has been stable and therapeutic.  CBC and stool Hemoccult testing will be monitored to exclude occult GI blood loss.

## 2011-06-26 NOTE — Progress Notes (Signed)
HPI: Ms. Bernardini was evaluated in consultation in the office today at the kind request of [name] for a history of aortic valve disease requiring aortic valve replacement surgery and chronic atrial fibrillation requiring anticoagulation.  I previously followed this nice woman, but have not seen her for the past 3-1/2 years since she moved to [place].  She is now returned to this area and is reestablishing contact with appropriate medical professionals.  An echocardiogram performed 3 years ago indicated the presence of mild aortic stenosis, but she subsequently required aortic valve replacement with a bioprosthetic valve performed at [facility] approximately one year ago.  Her course was apparently uncomplicated, and she has done well since.  Unfortunately, she is disabled related to orthopaedic problems with her back and knees, and lifestyle is sedentary.  She apparently did not have coronary disease and required no bypass grafts.  Medical records have been requested.  She has had known atrial fibrillation for a number of years for which she has been anticoagulated.  She has some bruising, but no significant bleeding complications.  Risk factors for thromboembolism include her gender, age and history of hypertension and diabetes.  Current Outpatient Prescriptions on File Prior to Visit  Medication Sig Dispense Refill  . Acetaminophen (TYLENOL ARTHRITIS PAIN PO) PRN       . amLODipine (NORVASC) 5 MG tablet One tab by mouth daily  30 tablet  3  . anastrozole (ARIMIDEX) 1 MG tablet Take 1 tablet (1 mg total) by mouth daily.  30 tablet  3  . benzonatate (TESSALON PERLES) 100 MG capsule Take 1 capsule (100 mg total) by mouth 3 (three) times daily as needed for cough.  20 capsule  0  . citalopram (CELEXA) 40 MG tablet Take 1 tablet (40 mg total) by mouth daily. Between 5 and 7 pm  30 tablet  3  . furosemide (LASIX) 40 MG tablet Take 1 tablet (40 mg total) by mouth daily.  30 tablet  3   . glimepiride (AMARYL) 1 MG tablet Take 1 tablet (1 mg total) by mouth daily before breakfast.  30 tablet  3  . lisinopril (PRINIVIL,ZESTRIL) 40 MG tablet Take 1 tablet (40 mg total) by mouth daily.  30 tablet  3  . metoprolol (TOPROL-XL) 50 MG 24 hr tablet One half tab by mouth daily  30 tablet  3  . omeprazole (PRILOSEC) 20 MG capsule Take 1 capsule (20 mg total) by mouth daily.  30 capsule  3  . polyethylene glycol powder (MIRALAX) powder 1 cap full in clear liquid daily, hold for loose stools  255 g  6  . potassium chloride SA (K-DUR,KLOR-CON) 20 MEQ tablet Take 1 tablet (20 mEq total) by mouth daily.  30 tablet  3  . simvastatin (ZOCOR) 40 MG tablet Take 1 tablet (40 mg total) by mouth at bedtime.  30 tablet  3  . warfarin (COUMADIN) 5 MG tablet Take 1 tablet (5 mg total) by mouth daily. At 5pm as directed  40 tablet  0    Allergies  Allergen Reactions  . Codeine       Past Medical History  Diagnosis Date  . Diabetes mellitus   . Hyperlipidemia   . Eosinophilia   . Cataract   . Hypertension   . Atrial fibrillation prior to 2008    moderate biatrial enlargement in 2009; mild to moderate LVH with a low-normal EF  . GERD (gastroesophageal reflux disease)     Hemorrhoids; history of  peptic ulcer disease  . DJD (degenerative joint disease) of knee   . Carcinoma of breast     Right mastectomy; radiation and hormonal therapy  . Sleep apnea     CPAP  . Chronic anticoagulation   . Aortic valve disease     mild stenosis and regurgitation; peak gradient of 30-35 mmHg in 2009     Past Surgical History  Procedure Date  . Mastectomy partial / lumpectomy w/ axillary lymphadenectomy 2008    Right for carcinoma;  . Exploration post operative open heart   . Cardiac valve replacement     [facility]     Family History  Problem Relation Age of Onset  . Heart disease Mother   . Stroke Father   . Cancer Sister   . Heart disease Sister   . Stroke Sister   . Stroke Brother      History    Social History  . Marital Status: Divorced    Spouse Name: N/A    Number of Children: N/A  . Years of Education: N/A   Occupational History  . Not on file.   Social History Main Topics  . Smoking status: Never Smoker   . Smokeless tobacco: Not on file  . Alcohol Use: No  . Drug Use: No  . Sexually Active: Not on file   Other Topics Concern  . Not on file   Social History Narrative  . No narrative on file     ROS: Intermittent pedal edema; upper and lower dentures; corrective lenses for near vision; class 1-2 dyspnea on exertion.   All other systems reviewed and are negative.  PHYSICAL EXAM: BP 134/76  Pulse 73  Resp 18  Ht 5\' 6"  (1.676 m)  SpO2 97%  General-Well-developed; no acute distress; seated on a rolling walker, which she utilizes for all ambulation Body Habitus-overweight HEENT-[place]/AT; PERRL; EOM intact; conjunctiva and lids nl Neck-No JVD; minimal systolic sounds over the carotids likely represent transmitted murmur Endocrine-No thyromegaly Lungs-Clear lung fields; resonant percussion; normal I-to-E ratio Cardiovascular- normal PMI; normal S1 and S2; grade 1-2/6 buzzing systolic ejection murmur at the cardiac base with minimal radiation to the carotids Abdomen-BS normal; soft and non-tender without masses or organomegaly Musculoskeletal-No deformities, cyanosis or clubbing Neurologic-Nl cranial nerves; symmetric strength and tone Skin- Warm, no significant lesions Extremities-Nl distal pulses; no edema  EKG- Atrial fibrillation with a controlled ventricular response and a heart rate of 71 bpm; PVCs; nonspecific ST segment abnormality; no previous tracing.  ASSESSMENT AND PLAN:

## 2011-06-26 NOTE — Assessment & Plan Note (Signed)
Blood pressure control has been good; current medications will be continued. 

## 2011-06-27 MED ORDER — SIMVASTATIN 20 MG PO TABS: 20.0000 mg | ORAL_TABLET | Freq: Every day | ORAL | Status: DC

## 2011-07-03 ENCOUNTER — Encounter (INDEPENDENT_AMBULATORY_CARE_PROVIDER_SITE_OTHER): Payer: Medicare Other

## 2011-07-03 DIAGNOSIS — Z7901 Long term (current) use of anticoagulants: Secondary | ICD-10-CM

## 2011-07-18 LAB — DIFFERENTIAL
Basophils Absolute: 0.1
Eosinophils Relative: 11 — ABNORMAL HIGH
Lymphocytes Relative: 15
Monocytes Absolute: 0.7
Monocytes Relative: 6
Neutro Abs: 8.7 — ABNORMAL HIGH

## 2011-07-18 LAB — CBC
MCV: 83.4
Platelets: 373
RDW: 13.5
WBC: 12.7 — ABNORMAL HIGH

## 2011-07-18 LAB — COMPREHENSIVE METABOLIC PANEL
AST: 25
Albumin: 3.8
Chloride: 92 — ABNORMAL LOW
Creatinine, Ser: 0.53
GFR calc Af Amer: >60
Potassium: 3.4 — ABNORMAL LOW
Total Bilirubin: 0.6
Total Protein: 6.9

## 2011-07-21 ENCOUNTER — Encounter: Payer: Self-pay | Admitting: Family Medicine

## 2011-07-21 ENCOUNTER — Ambulatory Visit (INDEPENDENT_AMBULATORY_CARE_PROVIDER_SITE_OTHER): Payer: Medicare Other | Admitting: Family Medicine

## 2011-07-21 VITALS — BP 140/72 | HR 77 | Resp 16 | Ht 65.5 in | Wt 255.4 lb

## 2011-07-21 DIAGNOSIS — K219 Gastro-esophageal reflux disease without esophagitis: Secondary | ICD-10-CM

## 2011-07-21 DIAGNOSIS — I739 Peripheral vascular disease, unspecified: Secondary | ICD-10-CM

## 2011-07-21 DIAGNOSIS — M62838 Other muscle spasm: Secondary | ICD-10-CM

## 2011-07-21 DIAGNOSIS — M1710 Unilateral primary osteoarthritis, unspecified knee: Secondary | ICD-10-CM

## 2011-07-21 DIAGNOSIS — I10 Essential (primary) hypertension: Secondary | ICD-10-CM

## 2011-07-21 DIAGNOSIS — Z23 Encounter for immunization: Secondary | ICD-10-CM

## 2011-07-21 DIAGNOSIS — IMO0002 Reserved for concepts with insufficient information to code with codable children: Secondary | ICD-10-CM

## 2011-07-21 MED ORDER — ANASTROZOLE 1 MG PO TABS: 1.0000 mg | ORAL_TABLET | Freq: Every day | ORAL | Status: DC

## 2011-07-21 MED ORDER — OMEPRAZOLE 20 MG PO CPDR: 20.0000 mg | DELAYED_RELEASE_CAPSULE | Freq: Two times a day (BID) | ORAL | Status: DC

## 2011-07-21 MED ORDER — GLUCOSE BLOOD VI STRP: ORAL_STRIP | Status: DC

## 2011-07-21 MED ORDER — CITALOPRAM HYDROBROMIDE 40 MG PO TABS: 40.0000 mg | ORAL_TABLET | Freq: Every day | ORAL | Status: DC

## 2011-07-21 MED ORDER — ACCU-CHEK FASTCLIX LANCETS MISC: 1.0000 | Freq: Every day | Status: DC

## 2011-07-21 MED ORDER — DICLOFENAC SODIUM 1 % TD GEL: 1.0000 "application " | TRANSDERMAL | Status: DC | PRN

## 2011-07-21 MED ORDER — FUROSEMIDE 40 MG PO TABS: 40.0000 mg | ORAL_TABLET | Freq: Every day | ORAL | Status: DC

## 2011-07-21 MED ORDER — NYSTATIN 100000 UNIT/GM EX CREA: 1.0000 "application " | TOPICAL_CREAM | CUTANEOUS | Status: DC | PRN

## 2011-07-21 MED ORDER — LISINOPRIL 40 MG PO TABS: 40.0000 mg | ORAL_TABLET | Freq: Every day | ORAL | Status: DC

## 2011-07-21 MED ORDER — GLIMEPIRIDE 1 MG PO TABS: 1.0000 mg | ORAL_TABLET | Freq: Every day | ORAL | Status: DC

## 2011-07-21 MED ORDER — POTASSIUM CHLORIDE CRYS ER 20 MEQ PO TBCR: 20.0000 meq | EXTENDED_RELEASE_TABLET | Freq: Every day | ORAL | Status: DC

## 2011-07-21 NOTE — Patient Instructions (Addendum)
I will set you up to get a test for the blood flow in your legs I will check your potassium levels If your cramps get worse please cause  Continue your current medications I will call with lab results F/U in 3 months

## 2011-07-21 NOTE — Progress Notes (Signed)
  Subjective:    Patient ID: Kristin Logan, female    DOB: 28-Jul-1932, 77 y.o.   MRN: [id number]  HPI  Knee Pain-  both knees have been hurting her.s/p injections by ortho. Would like voltaern gel refilled which helps with pain, also takes tylenol arthritis which helps a lot  Spasms has been getting bad cramps in her legs and recently she started having them in her stomach for the past week. Does not feel like stomach pain, no diarrhea, no large evaucation of stools, +nausea, feels like her acid reflux has worsened has a burning sensation into throat with bitter taste. Denies dysuria, vaginal discharge or bleeding, appetite good. Spasms in stomach occur at bedtime mostly, she feels like they move from her legs to her stomach.   Also describes- pain in legs with minimal walking, feet feel ice cold even though she has socks on.  Review of Systems - per above  CVS- no CP, no SOB     Objective:   Physical Exam GEN- NAD, obese, in rolling walker, alert and oriented x 3  CVS- RRR, 2/6 SEM RESP-CTAB, no wheeze ABD- NABS, soft, NT, ND, no suprapubic tenderness, large pannus Skin- no rash near areas where pain felt, small echcymosis at site of previous right knee injection Ext- no edema      Assessment & Plan:

## 2011-07-23 DIAGNOSIS — M62838 Other muscle spasm: Secondary | ICD-10-CM | POA: Insufficient documentation

## 2011-07-23 NOTE — Assessment & Plan Note (Signed)
Her abdominal spasm do not appear to be GI in etiology, she describes more musculoskeletal pain. Will check BMET

## 2011-07-23 NOTE — Assessment & Plan Note (Signed)
Increase PPI, to BID dosing

## 2011-07-23 NOTE — Assessment & Plan Note (Signed)
S/p injections- will refill voltaren

## 2011-07-23 NOTE — Assessment & Plan Note (Signed)
Pt describes some claudication, she has risk factors for disease. Will discuss with her cardiology office if the work-up could be done there- ABI/Arterial dopplers

## 2011-07-24 ENCOUNTER — Encounter: Payer: Medicare Other | Admitting: *Deleted

## 2011-07-26 ENCOUNTER — Other Ambulatory Visit: Payer: Self-pay | Admitting: Family Medicine

## 2011-07-26 DIAGNOSIS — I739 Peripheral vascular disease, unspecified: Secondary | ICD-10-CM

## 2011-07-28 NOTE — Progress Notes (Signed)
Pt has appt with dr. Dietrich Pates for 08/01/2011 2:00. Pt was called and notified

## 2011-08-01 ENCOUNTER — Ambulatory Visit (INDEPENDENT_AMBULATORY_CARE_PROVIDER_SITE_OTHER): Payer: Medicare Other | Admitting: Adult Health

## 2011-08-01 ENCOUNTER — Encounter: Payer: Self-pay | Admitting: Adult Health

## 2011-08-01 DIAGNOSIS — M62838 Other muscle spasm: Secondary | ICD-10-CM

## 2011-08-01 DIAGNOSIS — I739 Peripheral vascular disease, unspecified: Secondary | ICD-10-CM

## 2011-08-01 NOTE — Progress Notes (Signed)
HPI:  [name] is a 77 y/o patient of Dr. Dietrich Pates we are following for ongoing assessment and treatment of aortic valve disease, s/p aortic valve replacement (bioprosthetic at [facility] in 2011). She did not have CAD.  She remains on coumadin for atrial fibrillation.  She was seen by Dr. Lodema Hong in her office last month for complaints of leg cramps.  Dr. Lodema Hong was concerned that she may have symptoms of intermittent claudication and asked for our recommendations. She denies any further pain since being seen by Dr. Lodema Hong.  She describes pain as cramping going up her leg, relieved with walking, worse with rest. No shortness of breath, no dizziness. No chest pain or LEE.  Allergies  Allergen Reactions  . Codeine     Current Outpatient Prescriptions  Medication Sig Dispense Refill  . ACCU-CHEK FASTCLIX LANCETS MISC 1 each by Does not apply route daily. Use as directed with Accuchek meter once daily  102 each  6  . Acetaminophen (TYLENOL ARTHRITIS PAIN PO) PRN       . amLODipine (NORVASC) 5 MG tablet One tab by mouth daily  30 tablet  3  . anastrozole (ARIMIDEX) 1 MG tablet Take 1 tablet (1 mg total) by mouth daily. For breast cancer  30 tablet  6  . benzonatate (TESSALON PERLES) 100 MG capsule Take 1 capsule (100 mg total) by mouth 3 (three) times daily as needed for cough.  20 capsule  0  . diclofenac sodium (VOLTAREN) 1 % GEL Apply 1 application topically as needed (for knee and joint pain).  100 g  3  . furosemide (LASIX) 40 MG tablet Take 1 tablet (40 mg total) by mouth daily. For fluid  30 tablet  6  . glimepiride (AMARYL) 1 MG tablet Take 1 tablet (1 mg total) by mouth daily before breakfast. For diabetes  30 tablet  6  . glucose blood (ACCU-CHEK AVIVA) test strip Use as instructed x once daily  Dx 250.00  50 each  6  . lisinopril (PRINIVIL,ZESTRIL) 40 MG tablet Take 1 tablet (40 mg total) by mouth daily. For blood pressure  30 tablet  6  . metoprolol (TOPROL-XL) 50 MG 24 hr tablet One  half tab by mouth daily  30 tablet  3  . nystatin (MYCOSTATIN) cream Apply 1 application topically as needed for dry skin. For yeast rash  30 g  3  . omeprazole (PRILOSEC) 20 MG capsule Take 1 capsule (20 mg total) by mouth 2 (two) times daily. For stomach acid  60 capsule  6  . polyethylene glycol powder (MIRALAX) powder 1 cap full in clear liquid daily, hold for loose stools  255 g  6  . potassium chloride SA (K-DUR,KLOR-CON) 20 MEQ tablet Take 1 tablet (20 mEq total) by mouth daily. For potassium and fluid pill  30 tablet  6  . simvastatin (ZOCOR) 20 MG tablet Take 1 tablet (20 mg total) by mouth at bedtime.  30 tablet  3  . warfarin (COUMADIN) 5 MG tablet Take 1 tablet (5 mg total) by mouth daily. At 5pm as directed  40 tablet  0  . citalopram (CELEXA) 40 MG tablet Take 1 tablet (40 mg total) by mouth daily. Between 5 and 7 pm, for Depression  30 tablet  6    Past Medical History  Diagnosis Date  . Diabetes mellitus   . Hyperlipidemia   . Eosinophilia   . Cataract   . Hypertension   . Atrial fibrillation prior to 2008  moderate biatrial enlargement in 2009; mild to moderate LVH with a low-normal EF  . GERD (gastroesophageal reflux disease)     Hemorrhoids; history of peptic ulcer disease  . DJD (degenerative joint disease) of knee   . Carcinoma of breast     Right mastectomy; radiation and hormonal therapy  . Sleep apnea     CPAP  . Chronic anticoagulation   . Aortic valve disease     mild stenosis and regurgitation; peak gradient of 30-35 mmHg in 2009    Past Surgical History  Procedure Date  . Mastectomy partial / lumpectomy w/ axillary lymphadenectomy 2008    Right for carcinoma;  . Exploration post operative open heart   . Cardiac valve replacement     [facility]    AVW:UJWJXB of systems complete and found to be negative unless listed above PHYSICAL EXAM BP 122/76  Pulse 68  Ht 5\' 6"  (1.676 m)  Wt 257 lb (116.574 kg)  BMI 41.48 kg/m2  SpO2 97%  General: Well  developed, well nourished, in no acute distress Head: Eyes PERRLA, No xanthomas.   Normal cephalic and atramatic  Lungs: Clear bilaterally to auscultation and percussion. Heart: HRRR S1 S2, with prosthetic valve murmur in the LSB.  Pulses are 2+ & equal.            No carotid bruit. No JVD.  No abdominal bruits. No femoral bruits. Abdomen: Bowel sounds are positive, abdomen soft and non-tender without masses or  Hernia's noted. Msk:  Back normal, normal gait. Diminished strength and tone for age in lower extremities. Extremities: No clubbing, cyanosis or edema.  DP +1. Portable ultrasound of DP is completed in exam room, and found to be well auscultated. Neuro: Alert and oriented X 3. Psych:  Good affect, responds appropriately EKG:  ASSESSMENT AND PLAN

## 2011-08-01 NOTE — Patient Instructions (Signed)
Your physician recommends that you schedule a follow-up appointment in: Keep appointment with Dr Dietrich Pates next year

## 2011-08-01 NOTE — Assessment & Plan Note (Signed)
I do not think she is experiencing intermittent claudication symptoms with pain improving with walking and worse with rest. The pain travels to her thighs and is cramping. Doppler ultrasound has normal sounds without bruit. No further testing is necessary at this time. Continue current medications.

## 2011-08-01 NOTE — Assessment & Plan Note (Signed)
I do not think she is experiencing intermittent claudication symptoms with pain improving with walking and worse with rest. The pain travels to her thighs and is cramping. Doppler ultrasound has normal sounds without bruit.

## 2011-08-02 ENCOUNTER — Ambulatory Visit (INDEPENDENT_AMBULATORY_CARE_PROVIDER_SITE_OTHER): Payer: Medicare Other | Admitting: *Deleted

## 2011-08-02 ENCOUNTER — Telehealth: Payer: Self-pay | Admitting: Family Medicine

## 2011-08-02 DIAGNOSIS — Z7901 Long term (current) use of anticoagulants: Secondary | ICD-10-CM

## 2011-08-02 DIAGNOSIS — I4891 Unspecified atrial fibrillation: Secondary | ICD-10-CM

## 2011-08-02 LAB — POCT INR: INR: 2.1

## 2011-08-02 NOTE — Telephone Encounter (Signed)
I received a call at home about this pt and needed a signature for her CPAP machine supplies I spoke with the company, they had my residency information and old practice fax info. The machine was initially ordered by her previous PCP. I will have them fax me the paperwork

## 2011-08-05 ENCOUNTER — Other Ambulatory Visit: Payer: Self-pay | Admitting: Family Medicine

## 2011-08-16 ENCOUNTER — Telehealth: Payer: Self-pay | Admitting: Family Medicine

## 2011-08-16 NOTE — Telephone Encounter (Signed)
pls contact pt and let her know request is here for equipment for CPAP, she needs to ask the supplier to send the request to the pulmonologist or neurologist who is treating her for this

## 2011-08-22 ENCOUNTER — Other Ambulatory Visit: Payer: Self-pay

## 2011-08-22 ENCOUNTER — Telehealth: Payer: Self-pay | Admitting: Family Medicine

## 2011-08-22 MED ORDER — POLYETHYLENE GLYCOL 3350 17 GM/SCOOP PO POWD: ORAL | Status: DC

## 2011-08-23 NOTE — Telephone Encounter (Signed)
Does DR Jeanice Lim do cpap supplies? Since she is not here what do I tell them?

## 2011-08-23 NOTE — Telephone Encounter (Signed)
She needs to have the respiratory doc /pulmonolgist/sleep doc who is treating the sleep apnea, knows the pressure her machine is set at etc, send in supplies

## 2011-08-28 DIAGNOSIS — I4891 Unspecified atrial fibrillation: Secondary | ICD-10-CM

## 2011-08-28 DIAGNOSIS — E119 Type 2 diabetes mellitus without complications: Secondary | ICD-10-CM

## 2011-08-28 DIAGNOSIS — F329 Major depressive disorder, single episode, unspecified: Secondary | ICD-10-CM

## 2011-08-28 NOTE — Telephone Encounter (Signed)
The company called back and told her what she needed to do

## 2011-08-30 ENCOUNTER — Encounter: Payer: Medicare Other | Admitting: *Deleted

## 2011-09-13 DIAGNOSIS — E119 Type 2 diabetes mellitus without complications: Secondary | ICD-10-CM

## 2011-09-13 DIAGNOSIS — I4891 Unspecified atrial fibrillation: Secondary | ICD-10-CM

## 2011-09-13 DIAGNOSIS — F3289 Other specified depressive episodes: Secondary | ICD-10-CM

## 2011-09-13 DIAGNOSIS — F329 Major depressive disorder, single episode, unspecified: Secondary | ICD-10-CM

## 2011-09-15 ENCOUNTER — Other Ambulatory Visit: Payer: Self-pay

## 2011-09-15 MED ORDER — AMLODIPINE BESYLATE 5 MG PO TABS: ORAL_TABLET | ORAL | Status: DC

## 2011-10-17 ENCOUNTER — Other Ambulatory Visit: Payer: Self-pay

## 2011-10-17 MED ORDER — METOPROLOL SUCCINATE ER 50 MG PO TB24: ORAL_TABLET | ORAL | Status: DC

## 2011-10-24 ENCOUNTER — Other Ambulatory Visit: Payer: Self-pay | Admitting: Family Medicine

## 2011-10-26 ENCOUNTER — Encounter: Payer: Self-pay | Admitting: Family Medicine

## 2011-10-26 ENCOUNTER — Ambulatory Visit (INDEPENDENT_AMBULATORY_CARE_PROVIDER_SITE_OTHER): Payer: Medicare Other | Admitting: Family Medicine

## 2011-10-26 VITALS — BP 150/70 | HR 81 | Resp 16 | Ht 65.5 in | Wt 262.4 lb

## 2011-10-26 DIAGNOSIS — I4891 Unspecified atrial fibrillation: Secondary | ICD-10-CM

## 2011-10-26 DIAGNOSIS — E669 Obesity, unspecified: Secondary | ICD-10-CM

## 2011-10-26 DIAGNOSIS — E785 Hyperlipidemia, unspecified: Secondary | ICD-10-CM

## 2011-10-26 DIAGNOSIS — E119 Type 2 diabetes mellitus without complications: Secondary | ICD-10-CM

## 2011-10-26 DIAGNOSIS — G4730 Sleep apnea, unspecified: Secondary | ICD-10-CM

## 2011-10-26 DIAGNOSIS — I10 Essential (primary) hypertension: Secondary | ICD-10-CM

## 2011-10-26 NOTE — Patient Instructions (Signed)
Pick up some Calcium and Vitamin D We will call with the lab results Do not eat after midnight for your labs We will set you up for the nutritionist We will mail your [facility] form for your CAP services F/U  In 3 months

## 2011-10-26 NOTE — Progress Notes (Signed)
  Subjective:    Patient ID: Kristin Logan, female    DOB: [birth year], 78 y.o.   MRN: [id number]  HPI  Weight gain- cut out bread and snacks , concerned she keeps gaining weight, unable to exercise secondary to OA, DJD and endurance   Diabetes Mellitus- CBG fasting 130-140, denies hypoglycemic episodes, denies polyuria, polydipsia   A fib-pt seen by coumadin clinic, INR have been stable,has f/u with cardiology in the next few months  Constipation- needs larger bottle of Miralax, which is helping  HTN- took BP meds this AM, has no difficulty with medication          OSA- using CPAP at night, which helps her breathing and fatigue  Review of Systems   GEN- denies fatigue, fever, weight loss,weakness, recent illness HEENT- denies eye drainage, change in vision, nasal discharge, CVS- denies chest pain, palpitations RESP- denies SOB, cough, wheeze ABD- denies N/V, change in stools, abd pain GU- denies dysuria, hematuria, dribbling,+ incontinence MSK- + joint pain, muscle aches, injury Neuro- denies headache, dizziness, syncope, seizure activity       Objective:   Physical Exam GEN- NAD, obese, in rolling walker, alert and oriented x 3  HEENT- MMM, oropharynx clear Neck- no carotid bruit CVS- RRR, 2/6 SEM RESP-CTAB, no wheeze ABD- NABS, soft, NT, ND, no suprapubic tenderness, large pannus Ext- no edema       Assessment & Plan:

## 2011-10-27 ENCOUNTER — Encounter: Payer: Self-pay | Admitting: Family Medicine

## 2011-10-27 DIAGNOSIS — E669 Obesity, unspecified: Secondary | ICD-10-CM | POA: Insufficient documentation

## 2011-10-27 MED ORDER — BENZONATATE 100 MG PO CAPS: 100.0000 mg | ORAL_CAPSULE | Freq: Three times a day (TID) | ORAL | Status: DC | PRN

## 2011-10-27 MED ORDER — AMLODIPINE BESYLATE 5 MG PO TABS: ORAL_TABLET | ORAL | Status: DC

## 2011-10-27 MED ORDER — NYSTATIN 100000 UNIT/GM EX CREA: 1.0000 "application " | TOPICAL_CREAM | CUTANEOUS | Status: DC | PRN

## 2011-10-27 MED ORDER — DICLOFENAC SODIUM 1 % TD GEL: 1.0000 "application " | TRANSDERMAL | Status: DC | PRN

## 2011-10-27 MED ORDER — POLYETHYLENE GLYCOL 3350 17 GM/SCOOP PO POWD: ORAL | Status: DC

## 2011-10-27 MED ORDER — SIMVASTATIN 20 MG PO TABS: 20.0000 mg | ORAL_TABLET | Freq: Every day | ORAL | Status: DC

## 2011-10-27 MED ORDER — GLIMEPIRIDE 1 MG PO TABS: 1.0000 mg | ORAL_TABLET | Freq: Every day | ORAL | Status: DC

## 2011-10-27 NOTE — Assessment & Plan Note (Signed)
Elevated BP today, previous BP have been good,no change to meds

## 2011-10-27 NOTE — Assessment & Plan Note (Signed)
CPAP used on regular basis, her symptoms have improved a lot

## 2011-10-27 NOTE — Assessment & Plan Note (Signed)
Last A1C optimal, obtain labs, CBG have been higher than what I would prefer in the AM, send to nutritionist

## 2011-10-27 NOTE — Assessment & Plan Note (Signed)
Chronic anti-coagulation, f/u with cardiology as directed

## 2011-10-27 NOTE — Assessment & Plan Note (Signed)
Refer to nutrition, encourage any activity she can manage

## 2011-10-27 NOTE — Assessment & Plan Note (Signed)
Check FLP, continue Zocor, dose change upon results

## 2011-10-30 ENCOUNTER — Telehealth (HOSPITAL_COMMUNITY): Payer: Self-pay | Admitting: Dietician

## 2011-10-30 ENCOUNTER — Other Ambulatory Visit: Payer: Self-pay | Admitting: Family Medicine

## 2011-10-30 NOTE — Telephone Encounter (Signed)
Received referral from Dr. Deirdre Peer office for dx: diabetes, obesity. Appointment scheduled fr 11/02/11 at 2:00 PM.

## 2011-10-31 LAB — CBC
HCT: 40.3 % (ref 36.0–46.0)
Hemoglobin: 12.6 g/dL (ref 12.0–15.0)
MCHC: 31.3 g/dL (ref 30.0–36.0)
WBC: 8.7 10*3/uL (ref 4.0–10.5)

## 2011-10-31 LAB — LIPID PANEL
LDL Cholesterol: 65 mg/dL (ref 0–99)
Triglycerides: 140 mg/dL (ref <150)
VLDL: 28 mg/dL (ref 0–40)

## 2011-10-31 LAB — COMPREHENSIVE METABOLIC PANEL
ALT: 15 U/L (ref 0–35)
AST: 20 U/L (ref 0–37)
Albumin: 4.4 g/dL (ref 3.5–5.2)
Alkaline Phosphatase: 70 U/L (ref 39–117)
Glucose, Bld: 115 mg/dL — ABNORMAL HIGH (ref 70–99)
Potassium: 3.9 mEq/L (ref 3.5–5.3)
Sodium: 137 mEq/L (ref 135–145)
Total Bilirubin: 0.4 mg/dL (ref 0.3–1.2)
Total Protein: 6.6 g/dL (ref 6.0–8.3)

## 2011-10-31 LAB — HEMOGLOBIN A1C
Hgb A1c MFr Bld: 6.4 % — ABNORMAL HIGH (ref <5.7)
Mean Plasma Glucose: 137 mg/dL — ABNORMAL HIGH (ref <117)

## 2011-10-31 LAB — TSH: TSH: 5.231 u[IU]/mL — ABNORMAL HIGH (ref 0.350–4.500)

## 2011-11-01 LAB — T4, FREE: Free T4: 0.76 ng/dL — ABNORMAL LOW (ref 0.80–1.80)

## 2011-11-02 ENCOUNTER — Telehealth (HOSPITAL_COMMUNITY): Payer: Self-pay | Admitting: Dietician

## 2011-11-02 NOTE — Telephone Encounter (Signed)
Pt was a now-show for appointment scheduled for 11/02/2011 at 2:00 PM. Sent letter to pt home via [place] [employer] informing pt of no-show and requesting rescheduling appointment.

## 2011-11-06 ENCOUNTER — Telehealth: Payer: Self-pay | Admitting: Family Medicine

## 2011-11-06 DIAGNOSIS — K219 Gastro-esophageal reflux disease without esophagitis: Secondary | ICD-10-CM

## 2011-11-06 MED ORDER — LEVOTHYROXINE SODIUM 50 MCG PO TABS: 50.0000 ug | ORAL_TABLET | Freq: Every day | ORAL | Status: DC

## 2011-11-06 NOTE — Telephone Encounter (Signed)
I spoke with patient's daughter. Lab results were given. She will start low dose Synthroid. This may be contributing to her weight gain even with her change in diet. Her daughter also noted that mother had been complaining about a lot of acid reflux and pain after eating. She has history of ulcers greater than 30 years ago per report. She's not had any recent workup. She's currently on a PPI. Refer her to GI for endoscopy. I completed FL2 form last week, this was mailed to pt, if they do not receive then we will have to fill out another one

## 2011-11-20 ENCOUNTER — Encounter: Payer: Medicare Other | Admitting: *Deleted

## 2011-11-22 ENCOUNTER — Other Ambulatory Visit: Payer: Self-pay | Admitting: *Deleted

## 2011-11-22 DIAGNOSIS — D649 Anemia, unspecified: Secondary | ICD-10-CM

## 2011-12-01 ENCOUNTER — Telehealth: Payer: Self-pay | Admitting: Family Medicine

## 2011-12-01 NOTE — Telephone Encounter (Signed)
Spoke with pt and informed her that she should have her daughter to call Dr. Luvenia Starch office and reschedule her appt also provided pt with number.

## 2011-12-01 NOTE — Telephone Encounter (Signed)
Please call pt or daughter and have them reschedule with Dr. Jeanella Flattery office, see message below

## 2011-12-04 ENCOUNTER — Ambulatory Visit (INDEPENDENT_AMBULATORY_CARE_PROVIDER_SITE_OTHER): Payer: Medicare Other | Admitting: Family Medicine

## 2011-12-04 ENCOUNTER — Telehealth: Payer: Self-pay | Admitting: Family Medicine

## 2011-12-04 ENCOUNTER — Encounter: Payer: Self-pay | Admitting: Family Medicine

## 2011-12-04 VITALS — BP 120/62 | HR 77 | Resp 16 | Ht 65.5 in | Wt 265.0 lb

## 2011-12-04 DIAGNOSIS — F32A Depression, unspecified: Secondary | ICD-10-CM

## 2011-12-04 DIAGNOSIS — F3289 Other specified depressive episodes: Secondary | ICD-10-CM

## 2011-12-04 DIAGNOSIS — L600 Ingrowing nail: Secondary | ICD-10-CM

## 2011-12-04 DIAGNOSIS — E039 Hypothyroidism, unspecified: Secondary | ICD-10-CM

## 2011-12-04 DIAGNOSIS — IMO0002 Reserved for concepts with insufficient information to code with codable children: Secondary | ICD-10-CM

## 2011-12-04 DIAGNOSIS — F329 Major depressive disorder, single episode, unspecified: Secondary | ICD-10-CM

## 2011-12-04 DIAGNOSIS — J069 Acute upper respiratory infection, unspecified: Secondary | ICD-10-CM

## 2011-12-04 DIAGNOSIS — E669 Obesity, unspecified: Secondary | ICD-10-CM

## 2011-12-04 MED ORDER — DICLOFENAC SODIUM 1 % TD GEL: 1.0000 "application " | TRANSDERMAL | Status: DC | PRN

## 2011-12-04 MED ORDER — LORAZEPAM 0.5 MG PO TABS: 0.5000 mg | ORAL_TABLET | Freq: Two times a day (BID) | ORAL | Status: AC

## 2011-12-04 MED ORDER — NYSTATIN 100000 UNIT/GM EX CREA: 1.0000 "application " | TOPICAL_CREAM | CUTANEOUS | Status: DC | PRN

## 2011-12-04 MED ORDER — CEPHALEXIN 500 MG PO CAPS: 500.0000 mg | ORAL_CAPSULE | Freq: Two times a day (BID) | ORAL | Status: AC

## 2011-12-04 NOTE — Progress Notes (Signed)
  Subjective:    Patient ID: Kristin Logan, female    DOB: 09/23/1932, 78 y.o.   MRN: [id number]  HPI Ingrown toenail- patient had 2 ingrown toenails on bilateral great toes. She had pus coming out of both nails. Her daughter has been soaking her feet in expressing the pus. The swelling has gone down and she has minimal pus drainage at this time. She is now able to walk without severe pain. She has not seen a podiatrist recently  Sore throat- for the past 2 weeks she's had a sore throat associated with mild cough which has been productive with the use of Mucinex. She's been using her Tessalon cough pills which helped  Hypothyroidism- started on Synthroid she would need repeat thyroid studies in the next 4-6 weeks  Depression/anxiety- her daughter informed me that she has been suffering with sadness and anxiety. She has a history of both and is on Celexa. She's had recent deaths in the family and this occasionally causes her to get a stress and upset. They're asking for something to calm her down during these times. She is mostly alone with the exception of her nurse Aide. She does plan to reestablish a relationship with her husband. She denies SI. Sleep is okay, appetite is good.  Weight gain- she is concerned she continues to gain weight even though she is been changing her diet. She has not been exercising secondary to difficulty with ambulation  Carlyle Basques- her doctor who is present today is asking for a note so that she may stay with her mother and her residential complex when needed  Reflux- patient states her acid reflux as well as the abdominal pain and leg cramping improved after she started her Synthroid. They no longer want to go see GI  Review of Systems    GEN- denies fatigue, fever, weight loss,weakness, recent illness HEENT- denies eye drainage, change in vision, nasal discharge, CVS- denies chest pain, palpitations RESP- denies SOB, +cough, wheeze ABD- denies N/V, change  in stools, abd pain GU- denies dysuria, hematuria, dribbling,+ incontinence MSK- + joint pain, muscle aches, injury Neuro- denies headache, dizziness, syncope, seizure activity    Objective:   Physical Exam GEN- NAD, obese, in rolling walker, alert and oriented x 3  HEENT- MMM, oropharynx mild erythema, non icteric, EOMI, PERRL Neck- no carotid bruit CVS- RRR, 2/6 SEM RESP-CTAB, no wheeze ABD- NABS, soft, NT, ND, no suprapubic tenderness, large pannus Ext- no edema Feet- swelling around both great toes, erythema noted, no pus expressed from nailbed, thickened nail +ingrown nails bilat great toes Psych- not depressed or overly anxious appearing, normal mentation        Assessment & Plan:

## 2011-12-04 NOTE — Patient Instructions (Addendum)
Take the antibiotics  I will refer you to podiatry Take the ativan as needed for your nerves  I have refilled your creams Get the thyroid studies 1-2 days before your next appt Keep the April appt

## 2011-12-04 NOTE — Telephone Encounter (Signed)
Pt seen today

## 2011-12-05 DIAGNOSIS — F32A Depression, unspecified: Secondary | ICD-10-CM | POA: Insufficient documentation

## 2011-12-05 DIAGNOSIS — L600 Ingrowing nail: Secondary | ICD-10-CM | POA: Insufficient documentation

## 2011-12-05 DIAGNOSIS — E039 Hypothyroidism, unspecified: Secondary | ICD-10-CM | POA: Insufficient documentation

## 2011-12-05 DIAGNOSIS — IMO0002 Reserved for concepts with insufficient information to code with codable children: Secondary | ICD-10-CM | POA: Insufficient documentation

## 2011-12-05 DIAGNOSIS — F329 Major depressive disorder, single episode, unspecified: Secondary | ICD-10-CM | POA: Insufficient documentation

## 2011-12-05 MED ORDER — METOPROLOL SUCCINATE ER 25 MG PO TB24: ORAL_TABLET | ORAL | Status: DC

## 2011-12-05 NOTE — Assessment & Plan Note (Signed)
Podiatry referral

## 2011-12-05 NOTE — Assessment & Plan Note (Signed)
Pt has now declined nutrition referral, we discussed in detail foods to avoid, and any upper body exercise she can tolerate

## 2011-12-05 NOTE — Assessment & Plan Note (Signed)
Unable to express any pus at the bedside. Was started on Keflex and send to podiatry for of ingrown toenail

## 2011-12-05 NOTE — Assessment & Plan Note (Signed)
Supportive care, continue tessalon, keflex for foot infection

## 2011-12-05 NOTE — Assessment & Plan Note (Signed)
Continue celexa, will add prn lorazepam

## 2011-12-05 NOTE — Assessment & Plan Note (Signed)
Recheck labs in the next 4-6 weeks

## 2011-12-13 ENCOUNTER — Other Ambulatory Visit: Payer: Self-pay | Admitting: Family Medicine

## 2012-01-18 ENCOUNTER — Other Ambulatory Visit: Payer: Self-pay | Admitting: Family Medicine

## 2012-01-18 DIAGNOSIS — F3289 Other specified depressive episodes: Secondary | ICD-10-CM

## 2012-01-18 DIAGNOSIS — F329 Major depressive disorder, single episode, unspecified: Secondary | ICD-10-CM

## 2012-01-18 DIAGNOSIS — E119 Type 2 diabetes mellitus without complications: Secondary | ICD-10-CM

## 2012-01-18 DIAGNOSIS — I4891 Unspecified atrial fibrillation: Secondary | ICD-10-CM

## 2012-01-18 MED ORDER — ALBUTEROL SULFATE HFA 108 (90 BASE) MCG/ACT IN AERS: 2.0000 | INHALATION_SPRAY | RESPIRATORY_TRACT | Status: DC | PRN

## 2012-01-25 ENCOUNTER — Ambulatory Visit: Payer: Medicare Other | Admitting: Family Medicine

## 2012-02-05 ENCOUNTER — Ambulatory Visit (INDEPENDENT_AMBULATORY_CARE_PROVIDER_SITE_OTHER): Payer: Medicare Other | Admitting: Family Medicine

## 2012-02-05 ENCOUNTER — Encounter: Payer: Self-pay | Admitting: Family Medicine

## 2012-02-05 VITALS — BP 142/78 | HR 103 | Resp 18 | Ht 65.5 in | Wt 263.1 lb

## 2012-02-05 DIAGNOSIS — I10 Essential (primary) hypertension: Secondary | ICD-10-CM

## 2012-02-05 DIAGNOSIS — IMO0002 Reserved for concepts with insufficient information to code with codable children: Secondary | ICD-10-CM

## 2012-02-05 DIAGNOSIS — Z853 Personal history of malignant neoplasm of breast: Secondary | ICD-10-CM

## 2012-02-05 DIAGNOSIS — R269 Unspecified abnormalities of gait and mobility: Secondary | ICD-10-CM

## 2012-02-05 DIAGNOSIS — F329 Major depressive disorder, single episode, unspecified: Secondary | ICD-10-CM

## 2012-02-05 DIAGNOSIS — E119 Type 2 diabetes mellitus without complications: Secondary | ICD-10-CM

## 2012-02-05 DIAGNOSIS — R2681 Unsteadiness on feet: Secondary | ICD-10-CM

## 2012-02-05 DIAGNOSIS — I4891 Unspecified atrial fibrillation: Secondary | ICD-10-CM

## 2012-02-05 DIAGNOSIS — F3289 Other specified depressive episodes: Secondary | ICD-10-CM

## 2012-02-05 DIAGNOSIS — F32A Depression, unspecified: Secondary | ICD-10-CM

## 2012-02-05 DIAGNOSIS — J40 Bronchitis, not specified as acute or chronic: Secondary | ICD-10-CM

## 2012-02-05 DIAGNOSIS — E039 Hypothyroidism, unspecified: Secondary | ICD-10-CM

## 2012-02-05 DIAGNOSIS — M1710 Unilateral primary osteoarthritis, unspecified knee: Secondary | ICD-10-CM

## 2012-02-05 DIAGNOSIS — H9190 Unspecified hearing loss, unspecified ear: Secondary | ICD-10-CM

## 2012-02-05 MED ORDER — POLYETHYLENE GLYCOL 3350 17 GM/SCOOP PO POWD: ORAL | Status: DC

## 2012-02-05 MED ORDER — DICLOFENAC SODIUM 1 % TD GEL: 1.0000 "application " | TRANSDERMAL | Status: DC | PRN

## 2012-02-05 MED ORDER — AZITHROMYCIN 250 MG PO TABS: ORAL_TABLET | ORAL | Status: AC

## 2012-02-05 MED ORDER — NYSTATIN 100000 UNIT/GM EX CREA: 1.0000 "application " | TOPICAL_CREAM | CUTANEOUS | Status: DC | PRN

## 2012-02-05 MED ORDER — LEVOTHYROXINE SODIUM 50 MCG PO TABS: 50.0000 ug | ORAL_TABLET | Freq: Every day | ORAL | Status: DC

## 2012-02-05 MED ORDER — FLUOXETINE HCL 10 MG PO CAPS: 10.0000 mg | ORAL_CAPSULE | Freq: Every day | ORAL | Status: DC

## 2012-02-05 MED ORDER — ALBUTEROL SULFATE HFA 108 (90 BASE) MCG/ACT IN AERS: 2.0000 | INHALATION_SPRAY | RESPIRATORY_TRACT | Status: DC | PRN

## 2012-02-05 NOTE — Patient Instructions (Addendum)
Take the zpak for your bronchitis Schedule Mammogram Continue all other medications Follow-up with podiatry I will refer to audiologist Make a follow-up appt with cancer center Chair lift to be ordered F/U 6 weeks for recheck

## 2012-02-06 ENCOUNTER — Encounter: Payer: Self-pay | Admitting: Family Medicine

## 2012-02-06 DIAGNOSIS — J40 Bronchitis, not specified as acute or chronic: Secondary | ICD-10-CM | POA: Insufficient documentation

## 2012-02-06 DIAGNOSIS — R2681 Unsteadiness on feet: Secondary | ICD-10-CM | POA: Insufficient documentation

## 2012-02-06 DIAGNOSIS — H9190 Unspecified hearing loss, unspecified ear: Secondary | ICD-10-CM | POA: Insufficient documentation

## 2012-02-06 LAB — TSH: TSH: 1.218 u[IU]/mL (ref 0.350–4.500)

## 2012-02-06 LAB — T3, FREE: T3, Free: 2.9 pg/mL (ref 2.3–4.2)

## 2012-02-06 NOTE — Assessment & Plan Note (Signed)
Will be sent to audiology, pt willing to wear aides

## 2012-02-06 NOTE — Assessment & Plan Note (Addendum)
No change to meds, above goal, if this persist consider increasing to 10mg  norvasc

## 2012-02-06 NOTE — Assessment & Plan Note (Signed)
Improved some with addition of ativan, she is lonely as she lives along with exception of aide during the day, her daughter has now decided to come back and stay with her on and off.  No change to meds

## 2012-02-06 NOTE — Assessment & Plan Note (Signed)
Zpak, robitussin OTC

## 2012-02-06 NOTE — Assessment & Plan Note (Signed)
Well controlled no change to meds 

## 2012-02-06 NOTE — Assessment & Plan Note (Signed)
Unchanged, tylenol arthritis to be continued

## 2012-02-06 NOTE — Assessment & Plan Note (Signed)
Pt was contacted for her follow-up at cancer center, will set up Mammogram.  They will need to discuss stopping the arimidex

## 2012-02-06 NOTE — Progress Notes (Signed)
  Subjective:    Patient ID: Kristin Logan, female    DOB: 1932/02/22, 78 y.o.   MRN: [id number]  HPI Patient here to follow chronic medical problems. She is here with her daughter Darel Hong. Diabetes mellitus- she does not bring her blood sugar log today but her fastings have been good. Denies hypoglycemia Cough-she has had productive cough for the past 3 weeks associated with chills but no fever. Occasional wheeze. She feels a little bit better today OSA-she has been using CPAP machine when states this helps a lot. Hypothyroidism-she had labs drawn this morning started on 50 mcg at last visit Medications need to be refilled She is asking for a lift chair to help her in the home get up from a seated position a little bit easy and also to help elevate her legs while seated She would like referral to audiology for hearing  Review of Systems  GEN- denies fatigue, fever, weight loss,weakness, recent illness HEENT- denies eye drainage, change in vision, nasal discharge, CVS- denies chest pain, palpitations RESP- denies SOB, +cough, wheeze ABD- denies N/V, change in stools, abd pain GU- denies dysuria, hematuria, dribbling,+ incontinence MSK- + joint pain, muscle aches, injury Neuro- denies headache, dizziness, syncope, seizure activity       Objective:   Physical Exam GEN- NAD, obese, in rolling walker, alert and oriented x 3 ,decreased hearing HEENT- MMM, oropharynx mild erythema, non icteric, EOMI, PERRL, Right TM with impaction, Left TM clear CVS- RRR, 2/6 SEM RESP-CTAB, no wheeze, upper airway congestion ABD- NABS, soft, NT, ND, no suprapubic tenderness, large pannus Ext- no edema Psych- not depressed or overly anxious appearing, normal mentation, normal affect          Assessment & Plan:

## 2012-02-06 NOTE — Assessment & Plan Note (Signed)
Followed by coumadin clinic, sinus rhythm today

## 2012-02-06 NOTE — Assessment & Plan Note (Signed)
TSH now within normal limits, continue synthroid

## 2012-02-16 ENCOUNTER — Ambulatory Visit: Payer: Self-pay | Admitting: *Deleted

## 2012-02-16 ENCOUNTER — Telehealth: Payer: Self-pay | Admitting: Family Medicine

## 2012-02-16 DIAGNOSIS — Z7901 Long term (current) use of anticoagulants: Secondary | ICD-10-CM

## 2012-02-16 DIAGNOSIS — I4891 Unspecified atrial fibrillation: Secondary | ICD-10-CM

## 2012-02-16 NOTE — Telephone Encounter (Signed)
Returned phone call no answer.  Left message to return call.

## 2012-02-20 ENCOUNTER — Ambulatory Visit (INDEPENDENT_AMBULATORY_CARE_PROVIDER_SITE_OTHER): Payer: Medicare Other | Admitting: Family Medicine

## 2012-02-20 ENCOUNTER — Encounter: Payer: Self-pay | Admitting: Family Medicine

## 2012-02-20 VITALS — BP 134/72 | HR 86 | Resp 18 | Ht 65.5 in | Wt 265.0 lb

## 2012-02-20 DIAGNOSIS — N950 Postmenopausal bleeding: Secondary | ICD-10-CM

## 2012-02-20 DIAGNOSIS — N898 Other specified noninflammatory disorders of vagina: Secondary | ICD-10-CM

## 2012-02-20 DIAGNOSIS — B3731 Acute candidiasis of vulva and vagina: Secondary | ICD-10-CM

## 2012-02-20 DIAGNOSIS — Z7901 Long term (current) use of anticoagulants: Secondary | ICD-10-CM

## 2012-02-20 DIAGNOSIS — J209 Acute bronchitis, unspecified: Secondary | ICD-10-CM

## 2012-02-20 DIAGNOSIS — J40 Bronchitis, not specified as acute or chronic: Secondary | ICD-10-CM

## 2012-02-20 DIAGNOSIS — N939 Abnormal uterine and vaginal bleeding, unspecified: Secondary | ICD-10-CM

## 2012-02-20 DIAGNOSIS — B373 Candidiasis of vulva and vagina: Secondary | ICD-10-CM

## 2012-02-20 LAB — POC HEMOCCULT BLD/STL (OFFICE/1-CARD/DIAGNOSTIC): Fecal Occult Blood, POC: NEGATIVE

## 2012-02-20 MED ORDER — CLOTRIMAZOLE-BETAMETHASONE 1-0.05 % EX CREA: TOPICAL_CREAM | CUTANEOUS | Status: DC

## 2012-02-20 MED ORDER — ALBUTEROL SULFATE (5 MG/ML) 0.5% IN NEBU
2.5000 mg | INHALATION_SOLUTION | Freq: Once | RESPIRATORY_TRACT | Status: AC
  Administered 2012-02-20: 2.5 mg via RESPIRATORY_TRACT

## 2012-02-20 MED ORDER — ALBUTEROL SULFATE HFA 108 (90 BASE) MCG/ACT IN AERS: 2.0000 | INHALATION_SPRAY | RESPIRATORY_TRACT | Status: DC | PRN

## 2012-02-20 MED ORDER — FLUCONAZOLE 150 MG PO TABS: ORAL_TABLET | ORAL | Status: DC

## 2012-02-20 MED ORDER — PREDNISONE 10 MG PO TABS: ORAL_TABLET | ORAL | Status: DC

## 2012-02-20 MED ORDER — AMOXICILLIN-POT CLAVULANATE 200-28.5 MG PO CHEW: 1.0000 | CHEWABLE_TABLET | Freq: Two times a day (BID) | ORAL | Status: AC

## 2012-02-20 NOTE — Progress Notes (Signed)
  Subjective:    Patient ID: Kristin Logan, female    DOB: 03/15/1932, 78 y.o.   MRN: [id number]  HPI Cough continues to be productive, worse at night, has had some SOB and occasional wheezing, felt better a little bit but then it returned, no fever. Was seen by [facility], INR was drawn and found to be elevated at 4. She has then noticed some vaginal bleeding, small amounts for the past few days. She also has some irritation in the vaginal area that she has been using nystatin cream on.    Review of Systems - per above   GEN- + fatigue, fever, weight loss,weakness, recent illness HEENT- denies eye drainage, change in vision, nasal discharge, CVS- denies chest pain, palpitations RESP-+ SOB, +cough, +wheeze ABD- denies N/V, change in stools, abd pain GU- denies dysuria, hematuria, dribbling,+ incontinence MSK- + joint pain, muscle aches, injury Neuro- denies headache, dizziness, syncope, seizure activity     Objective:   Physical Exam GEN- NAD, obese, in rolling walker, alert and oriented x 3 ,decreased hearing CVS- RRR, 2/6 SEM RESP-normal WOB, scattered wheeze, no rhonchi, harsh cough ABD- NABS, soft, NT, ND, no suprapubic tenderness, large pannus GU- external genitalia- maceration extending to anal region with hypopigmentation to skin, thinned vaginal mucosa, white discharge, very difficult to see cervix, pt extremely uncomfortable with exam FOBT neg, no large external hemorroids, friable tissue in vaginal canal, no blood seen in vault Ext- no edema      Assessment & Plan:

## 2012-02-20 NOTE — Patient Instructions (Addendum)
Start the prednisone for your breathing Use your inhaler every 4 hours as needed  Take the new antibiotics Try the mucinex  Take the yeast pill as directed I will call to see what is going on with your coumadin level New cream for your bottom Get the chest x-ray done  I will call about the lift chair- [facility]  F/U Friday

## 2012-02-20 NOTE — Assessment & Plan Note (Addendum)
Pt to try mucinex, will given another course of antibiotics and add prednisone for wheeze, use albuterol  Improved with neb in office CXR to be done

## 2012-02-21 ENCOUNTER — Encounter: Payer: Self-pay | Admitting: Family Medicine

## 2012-02-21 ENCOUNTER — Ambulatory Visit (HOSPITAL_COMMUNITY)
Admission: RE | Admit: 2012-02-21 | Discharge: 2012-02-21 | Disposition: A | Discharge status: Home / Self Care | Payer: Medicare Other | Source: Ambulatory Visit | Attending: Family Medicine | Admitting: Family Medicine

## 2012-02-21 ENCOUNTER — Telehealth: Payer: Self-pay | Admitting: Family Medicine

## 2012-02-21 DIAGNOSIS — B373 Candidiasis of vulva and vagina: Secondary | ICD-10-CM | POA: Insufficient documentation

## 2012-02-21 DIAGNOSIS — J40 Bronchitis, not specified as acute or chronic: Secondary | ICD-10-CM | POA: Insufficient documentation

## 2012-02-21 DIAGNOSIS — B3731 Acute candidiasis of vulva and vagina: Secondary | ICD-10-CM | POA: Insufficient documentation

## 2012-02-21 DIAGNOSIS — N939 Abnormal uterine and vaginal bleeding, unspecified: Secondary | ICD-10-CM | POA: Insufficient documentation

## 2012-02-21 MED ORDER — ALBUTEROL SULFATE HFA 108 (90 BASE) MCG/ACT IN AERS: 2.0000 | INHALATION_SPRAY | RESPIRATORY_TRACT | Status: DC | PRN

## 2012-02-21 NOTE — Assessment & Plan Note (Signed)
She has chronic irritation with maceration of skin she states it has been that way for many years, will treat with diflucan oral and change to lotrisone cream

## 2012-02-21 NOTE — Assessment & Plan Note (Signed)
I discussed in detail with pt, we were unable to do a complete exam because of her discomfort, it appears the spotting is probably from her inflammed vaginal tissue, however I can not visualize her cervix. Discussed without U/S I am unable to evaluate for uterine cancer, fibroids, she wants to hold off and not proceed at this time

## 2012-02-21 NOTE — Assessment & Plan Note (Signed)
Her INR had been stable, though I failed to see who was monitoring her coumadin levels, supratherapuetic at recent check, This is to be rechecked in a few days, will call [facility] to see if I or cardiology will be following, previously cardiology following but she had not been to coumadin clinic since the end of 2012.

## 2012-02-21 NOTE — Telephone Encounter (Signed)
Sent in

## 2012-02-22 ENCOUNTER — Telehealth: Payer: Self-pay | Admitting: Cardiology

## 2012-02-22 ENCOUNTER — Ambulatory Visit: Payer: Self-pay | Admitting: *Deleted

## 2012-02-22 DIAGNOSIS — I4891 Unspecified atrial fibrillation: Secondary | ICD-10-CM

## 2012-02-22 DIAGNOSIS — Z7901 Long term (current) use of anticoagulants: Secondary | ICD-10-CM

## 2012-02-22 NOTE — Telephone Encounter (Signed)
TODAY INR 3.2 PT 38.1 PT ON 1MG  DAILY

## 2012-02-22 NOTE — Telephone Encounter (Signed)
See coumadin note. 

## 2012-02-23 ENCOUNTER — Encounter: Payer: Self-pay | Admitting: Family Medicine

## 2012-02-23 ENCOUNTER — Ambulatory Visit (INDEPENDENT_AMBULATORY_CARE_PROVIDER_SITE_OTHER): Payer: Medicare Other | Admitting: Family Medicine

## 2012-02-23 VITALS — BP 130/74 | HR 97 | Resp 18 | Ht 65.5 in | Wt 258.1 lb

## 2012-02-23 DIAGNOSIS — J40 Bronchitis, not specified as acute or chronic: Secondary | ICD-10-CM

## 2012-02-23 DIAGNOSIS — N898 Other specified noninflammatory disorders of vagina: Secondary | ICD-10-CM

## 2012-02-23 DIAGNOSIS — N939 Abnormal uterine and vaginal bleeding, unspecified: Secondary | ICD-10-CM

## 2012-02-23 NOTE — Assessment & Plan Note (Signed)
Doing very well, complete course of steroids and antibiotics

## 2012-02-23 NOTE — Patient Instructions (Signed)
No pneumonia on the chest xray or fluid Complete all of the prednisone/ and use the inhaler  Complete antibiotics F/U 3 months for next visit

## 2012-02-23 NOTE — Progress Notes (Signed)
  Subjective:    Patient ID: Kristin Logan, female    DOB: May 09, 1932, 78 y.o.   MRN: [id number]  HPI Patient presents to followup bronchitis. She was seen 2 days ago. She was started on prednisone, albuterol and antibiotics. She states that she slept better well last night and had minimal cough. She feels a lot better today.  Note she does have her Coumadin being managed by cardiology she has another followup appointment in the office next week for her Coumadin therapy .No further vaginal bleeding CXR-neg for PNA  Review of Systems - per above   GEN-  fatigue, fever, weight loss,weakness, recent illness HEENT- denies eye drainage, change in vision, nasal discharge, CVS- denies chest pain, palpitations RESP-denies SOB, +cough, wheeze ABD- denies N/V, change in stools, abd pain     Objective:   Physical Exam GEN- NAD, obese, in rolling walker, alert and oriented x 3 ,decreased hearing CVS- RRR, 2/6 SEM RESP-normal WOB, no wheeze, no rhonchi, harsh cough, good air movement  EXT- no edema       Assessment & Plan:

## 2012-02-23 NOTE — Assessment & Plan Note (Signed)
No further episodes

## 2012-02-28 ENCOUNTER — Ambulatory Visit (INDEPENDENT_AMBULATORY_CARE_PROVIDER_SITE_OTHER): Payer: Medicare Other | Admitting: *Deleted

## 2012-02-28 DIAGNOSIS — I4891 Unspecified atrial fibrillation: Secondary | ICD-10-CM

## 2012-02-28 DIAGNOSIS — Z7901 Long term (current) use of anticoagulants: Secondary | ICD-10-CM

## 2012-03-13 ENCOUNTER — Ambulatory Visit (INDEPENDENT_AMBULATORY_CARE_PROVIDER_SITE_OTHER): Payer: Medicare Other | Admitting: *Deleted

## 2012-03-13 DIAGNOSIS — E119 Type 2 diabetes mellitus without complications: Secondary | ICD-10-CM

## 2012-03-13 DIAGNOSIS — Z7901 Long term (current) use of anticoagulants: Secondary | ICD-10-CM

## 2012-03-13 DIAGNOSIS — F329 Major depressive disorder, single episode, unspecified: Secondary | ICD-10-CM

## 2012-03-13 DIAGNOSIS — I4891 Unspecified atrial fibrillation: Secondary | ICD-10-CM

## 2012-03-13 DIAGNOSIS — F3289 Other specified depressive episodes: Secondary | ICD-10-CM

## 2012-03-13 LAB — POCT INR: INR: 2.8

## 2012-03-14 ENCOUNTER — Ambulatory Visit (INDEPENDENT_AMBULATORY_CARE_PROVIDER_SITE_OTHER): Payer: Medicare Other | Admitting: Otolaryngology

## 2012-03-15 ENCOUNTER — Other Ambulatory Visit: Payer: Self-pay

## 2012-03-15 MED ORDER — CITALOPRAM HYDROBROMIDE 40 MG PO TABS: 40.0000 mg | ORAL_TABLET | Freq: Every day | ORAL | Status: DC

## 2012-03-15 MED ORDER — ANASTROZOLE 1 MG PO TABS: 1.0000 mg | ORAL_TABLET | Freq: Every day | ORAL | Status: DC

## 2012-03-15 MED ORDER — LISINOPRIL 40 MG PO TABS: 40.0000 mg | ORAL_TABLET | Freq: Every day | ORAL | Status: DC

## 2012-03-18 ENCOUNTER — Ambulatory Visit: Payer: Medicare Other | Admitting: Family Medicine

## 2012-03-21 ENCOUNTER — Ambulatory Visit: Payer: Medicare Other | Admitting: Family Medicine

## 2012-03-26 ENCOUNTER — Encounter: Payer: Self-pay | Admitting: Family Medicine

## 2012-03-26 ENCOUNTER — Ambulatory Visit (INDEPENDENT_AMBULATORY_CARE_PROVIDER_SITE_OTHER): Payer: Medicare Other | Admitting: Family Medicine

## 2012-03-26 VITALS — BP 132/60 | HR 98 | Resp 16 | Ht 65.5 in | Wt 258.0 lb

## 2012-03-26 DIAGNOSIS — N390 Urinary tract infection, site not specified: Secondary | ICD-10-CM

## 2012-03-26 LAB — POCT URINALYSIS DIPSTICK
Bilirubin, UA: NEGATIVE
Ketones, UA: NEGATIVE
Protein, UA: NEGATIVE
Spec Grav, UA: 1.01
pH, UA: 7

## 2012-03-26 MED ORDER — FLUCONAZOLE 150 MG PO TABS: ORAL_TABLET | ORAL | Status: DC

## 2012-03-26 MED ORDER — CEPHALEXIN 500 MG PO CAPS: 500.0000 mg | ORAL_CAPSULE | Freq: Two times a day (BID) | ORAL | Status: AC

## 2012-03-26 NOTE — Patient Instructions (Addendum)
Take the antibiotics as prescribed Continue current meds I will call about the cancer medication F/U 3 months

## 2012-03-26 NOTE — Progress Notes (Signed)
  Subjective:    Patient ID: Kristin Logan, female    DOB: 04-04-1932, 78 y.o.   MRN: [id number]  HPI  Urinary frequency over the weekend, now improving, had pain in lower stomach and felt it in her hands .     She denies pressure currently. She denies change in her bowels. Denies fever has very little irritation or burning when she urinates currently    Review of Systems - per above    GEN- denies fatigue, fever, weight loss,weakness, recent illness CVS- denies chest pain, palpitations RESP- denies SOB, cough, wheeze ABD- denies N/V, change in stools, abd pain GU- denies dysuria, hematuria, dribbling, +incontinence, +frequency       Objective:   Physical Exam GEN- NAD, obese, in rolling walker, alert and oriented x 3 ,decreased hearing CVS- RRR, 2/6 SEM ABD-NABS,soft,NT,ND, no cva tenderness  RESP-normal WOB, no wheeze, no rhonchi, harsh cough, good air movement  EXT- no edema , no swelling in hands Pulse - radial 2+       Assessment & Plan:    Acute cystitis- will start Keflex and culture urine, pt now back at her baseline

## 2012-03-28 LAB — URINE CULTURE

## 2012-04-02 ENCOUNTER — Ambulatory Visit (HOSPITAL_COMMUNITY)
Admission: RE | Admit: 2012-04-02 | Discharge: 2012-04-02 | Disposition: A | Discharge status: Home / Self Care | Payer: Medicare Other | Source: Ambulatory Visit | Attending: Family Medicine | Admitting: Family Medicine

## 2012-04-02 ENCOUNTER — Encounter: Payer: Self-pay | Admitting: Family Medicine

## 2012-04-02 ENCOUNTER — Ambulatory Visit (INDEPENDENT_AMBULATORY_CARE_PROVIDER_SITE_OTHER): Payer: Medicare Other | Admitting: Family Medicine

## 2012-04-02 VITALS — BP 118/62 | HR 64 | Resp 16 | Ht 65.5 in | Wt 256.1 lb

## 2012-04-02 DIAGNOSIS — I671 Cerebral aneurysm, nonruptured: Secondary | ICD-10-CM

## 2012-04-02 DIAGNOSIS — S32010A Wedge compression fracture of first lumbar vertebra, initial encounter for closed fracture: Secondary | ICD-10-CM | POA: Insufficient documentation

## 2012-04-02 DIAGNOSIS — M25529 Pain in unspecified elbow: Secondary | ICD-10-CM

## 2012-04-02 DIAGNOSIS — M549 Dorsalgia, unspecified: Secondary | ICD-10-CM

## 2012-04-02 DIAGNOSIS — W1809XA Striking against other object with subsequent fall, initial encounter: Secondary | ICD-10-CM | POA: Insufficient documentation

## 2012-04-02 DIAGNOSIS — R42 Dizziness and giddiness: Secondary | ICD-10-CM | POA: Insufficient documentation

## 2012-04-02 DIAGNOSIS — M25559 Pain in unspecified hip: Secondary | ICD-10-CM

## 2012-04-02 DIAGNOSIS — S060XAA Concussion with loss of consciousness status unknown, initial encounter: Secondary | ICD-10-CM

## 2012-04-02 DIAGNOSIS — S060X9A Concussion with loss of consciousness of unspecified duration, initial encounter: Secondary | ICD-10-CM

## 2012-04-02 DIAGNOSIS — M545 Low back pain, unspecified: Secondary | ICD-10-CM | POA: Insufficient documentation

## 2012-04-02 DIAGNOSIS — S32009A Unspecified fracture of unspecified lumbar vertebra, initial encounter for closed fracture: Secondary | ICD-10-CM

## 2012-04-02 DIAGNOSIS — R51 Headache: Secondary | ICD-10-CM | POA: Insufficient documentation

## 2012-04-02 DIAGNOSIS — Z7901 Long term (current) use of anticoagulants: Secondary | ICD-10-CM | POA: Insufficient documentation

## 2012-04-02 MED ORDER — OXYCODONE-ACETAMINOPHEN 7.5-325 MG PO TABS: 1.0000 | ORAL_TABLET | Freq: Four times a day (QID) | ORAL | Status: DC | PRN

## 2012-04-02 NOTE — Progress Notes (Signed)
  Subjective:    Patient ID: Kristin Logan, female    DOB: Feb 24, 1932, 78 y.o.   MRN: [id number]  HPI Pt fell backward and hit the back of her head and tailbone in the    Review of Systems     Objective:   Physical Exam        Assessment & Plan:

## 2012-04-02 NOTE — Patient Instructions (Addendum)
Get the x-rays done Percocet 7.5mg  take 1 tablet every 4-6 hours as needed Stop Arimedex  F/U Next Monday for recheck   Concussion and Brain Injury A blow to the head can stop the brain from working normally (concussion). It is usually not life-threatening. However, the results of the injury can be serious. Problems caused by the injury might show up right away or days or weeks later. Getting better might take some time. HOME CARE  Rest your body. Ways to rest your body include:   Getting plenty of sleep at night.   Going to sleep early.   Taking naps during the day when you feel tired.   Limit activities that require a lot of thought. This includes:   Time spent with homework.   Time spent with work related to a job.   TV watching.   Computer use.   Return to normal activities (driving, work, school) only when your doctor says it is okay.   Avoid high impact activity and sports until your doctor says it is okay.   Take medicines only as told by your doctor.   Do not drink alcohol until your doctor says it is okay.   Do not make important decisions without help until you feel better.   Follow up with your doctor as told.  GET HELP RIGHT AWAY IF:   You, your family, or your friends notice that:  You have bad headaches, or they get worse.   You have weakness, loss of feeling (numbness), or you feel off balance.   You keep throwing up (vomiting).   You feel tired or pass out (faint).   One black center of your eye (pupil) is larger than the other.   You twitch or shake (seize).   Your speech is not clear (slurred).   You are confused, restless, easily angered (agitated), or annoyed (irritable).   You cannot recognize or respond to people or activities.   You have neck pain.   You have trouble being woken up.   Your behavior changes.  MAKE SURE YOU:    Understand these instructions.   Will watch your condition.   Will get help right away if you are  not doing well or get worse.  Document Released: 09/06/2009 Document Revised: 09/07/2011 Document Reviewed: 09/06/2009 [facility] Patient Information 2012 [place], [place].

## 2012-04-02 NOTE — Assessment & Plan Note (Signed)
She is not at her baseline , discussed concussive symptoms with family, repeat CT of head done today which shows no bleed but concern for Anuersym. MRA to be done

## 2012-04-02 NOTE — Assessment & Plan Note (Signed)
Xray negative, likely soft tissue bruising

## 2012-04-02 NOTE — Assessment & Plan Note (Signed)
Compression fracture seen on lumbar spine new at L1 and progressive at L2, will send for treatments with Dr.Bethea Refill on percocet

## 2012-04-02 NOTE — Progress Notes (Signed)
  Subjective:    Patient ID: Kristin Logan, female    DOB: 06-11-1932, 78 y.o.   MRN: [id number]  HPI Pt presents for ER f/u s/p fall in [facility] while visiting her daughter, her rolling walker/chair caught on a rug and she fell backwards hitting her head and her back. Evaluated in ER, CT head neg, diagnosed with concussion, back x-ray negative per family, given short supply of percocet. She continues to have mild HA but much improved, she is still confused at times, but much improved, no hallucinations, normal speech, eating well now. She complains of severe pain in lower back and tailbone region.Left elbow pain INR was also checked and was normal at hospital.   Review of Systems   GEN- denies fatigue, fever, weight loss,weakness, recent illness HEENT- denies eye drainage, change in vision, nasal discharge, CVS- denies chest pain, palpitations RESP- denies SOB, cough, wheeze ABD- denies N/V, change in stools, abd pain MSK- + joint pain, +muscle aches, injury Neuro- + headache, dizziness, syncope, seizure activity      Objective:   Physical Exam GEN- NAD, obese, in rolling walker, alert and oriented x 3 ,decreased hearing HEENT-PEERL, EOMI, fundus small and hard to visualize, oropharynx clear, TM clear bilat CVS- RRR, 2/6 SEM RESP-normal WOB, CTAB ABD- NABS, soft, NT, ND,  Back- TTP lumbar spine and over sacrum, small amount of swelling and yellow bruising Elbow- mild TTP left side, no bruising, no swelling, good extension and flexion Neuro-CNII-XII in tact, slow to respond to questions from baseline Ext- no edema       Assessment & Plan:

## 2012-04-03 ENCOUNTER — Telehealth: Payer: Self-pay | Admitting: Family Medicine

## 2012-04-05 ENCOUNTER — Ambulatory Visit (HOSPITAL_COMMUNITY)
Admission: RE | Admit: 2012-04-05 | Discharge: 2012-04-05 | Disposition: A | Discharge status: Home / Self Care | Payer: Medicare Other | Source: Ambulatory Visit | Attending: Family Medicine | Admitting: Family Medicine

## 2012-04-05 DIAGNOSIS — G9389 Other specified disorders of brain: Secondary | ICD-10-CM | POA: Insufficient documentation

## 2012-04-05 DIAGNOSIS — I671 Cerebral aneurysm, nonruptured: Secondary | ICD-10-CM | POA: Insufficient documentation

## 2012-04-05 NOTE — Telephone Encounter (Signed)
patient is aware

## 2012-04-08 ENCOUNTER — Encounter: Payer: Self-pay | Admitting: Family Medicine

## 2012-04-08 ENCOUNTER — Telehealth: Payer: Self-pay | Admitting: Family Medicine

## 2012-04-08 ENCOUNTER — Ambulatory Visit (INDEPENDENT_AMBULATORY_CARE_PROVIDER_SITE_OTHER): Payer: Medicare Other | Admitting: *Deleted

## 2012-04-08 ENCOUNTER — Ambulatory Visit (INDEPENDENT_AMBULATORY_CARE_PROVIDER_SITE_OTHER): Payer: Medicare Other | Admitting: Family Medicine

## 2012-04-08 VITALS — BP 130/70 | HR 74 | Resp 16 | Ht 65.5 in | Wt 257.4 lb

## 2012-04-08 DIAGNOSIS — S060XAA Concussion with loss of consciousness status unknown, initial encounter: Secondary | ICD-10-CM

## 2012-04-08 DIAGNOSIS — Z5181 Encounter for therapeutic drug level monitoring: Secondary | ICD-10-CM

## 2012-04-08 DIAGNOSIS — I4891 Unspecified atrial fibrillation: Secondary | ICD-10-CM

## 2012-04-08 DIAGNOSIS — Z7901 Long term (current) use of anticoagulants: Secondary | ICD-10-CM

## 2012-04-08 DIAGNOSIS — S32010A Wedge compression fracture of first lumbar vertebra, initial encounter for closed fracture: Secondary | ICD-10-CM

## 2012-04-08 DIAGNOSIS — I671 Cerebral aneurysm, nonruptured: Secondary | ICD-10-CM

## 2012-04-08 DIAGNOSIS — S060X9A Concussion with loss of consciousness of unspecified duration, initial encounter: Secondary | ICD-10-CM

## 2012-04-08 DIAGNOSIS — S32009A Unspecified fracture of unspecified lumbar vertebra, initial encounter for closed fracture: Secondary | ICD-10-CM

## 2012-04-08 LAB — POCT INR: INR: 3.4

## 2012-04-08 NOTE — Assessment & Plan Note (Signed)
Mentation improving.

## 2012-04-08 NOTE — Assessment & Plan Note (Signed)
Pt to see Dr.Bethea tomorrow for evaluation and treatment , acetaminophen and narcotic medications given

## 2012-04-08 NOTE — Patient Instructions (Addendum)
You can take 1 pain medicine then 2-3 hours later you can take tylenol ( 1 tablet)  You can take 4 total pain pills a day  I will refer you to neurosurgeon in [facility]  Call Dr. Sharin Grave your back  Get the coumadin level checked  Keep appt for end of July

## 2012-04-08 NOTE — Progress Notes (Signed)
  Subjective:    Patient ID: Kristin Logan, female    DOB: 05/18/1932, 78 y.o.   MRN: [id number]  HPI Pt here to f/u concussion and fall, CT head concerning for aneurysm, MRI confirmed anterior aneurysm, she continues to have occasional headache but much improved, her confusion has cleared, eating well no emesis. Taking pain med as prescribed which help, also noted to have L1 compression fracture.  Still has tender spot on head , no change in bowel or bladder, able to ambulate, more pain with sitting long periods  Review of Systems   GEN- denies fatigue, fever, weight loss,weakness, recent illness HEENT- denies eye drainage, change in vision, nasal discharge, CVS- denies chest pain, palpitations RESP- denies SOB, cough, wheeze ABD- denies N/V, change in stools, abd pain GU- denies dysuria, hematuria, dribbling, incontinence MSK- + joint pain, +muscle aches, injury Neuro- denies headache, dizziness, syncope, seizure activity      Objective:   Physical Exam GEN- NAD, obese, in rolling walker, alert and oriented x 3 , HEENT-PEERL, EOMI , oropharynx clear, TM clear bilat CVS- RRR, 2/6 SEM RESP-normal WOB, CTAB Neuro-CNII-XII in tact, responds appropriately, moving all 4 ext  Ext- no edema Scalp- no open lesions, no bruising noted       Assessment & Plan:

## 2012-04-08 NOTE — Assessment & Plan Note (Signed)
No evidence of acute bleed, headaches resolving s/p concussion. Will send to neurosurgery for evaluation, I think this was probably an incidental finding

## 2012-04-10 ENCOUNTER — Ambulatory Visit (HOSPITAL_COMMUNITY)
Admission: RE | Admit: 2012-04-10 | Discharge: 2012-04-10 | Disposition: A | Discharge status: Home / Self Care | Payer: Medicare Other | Source: Ambulatory Visit | Attending: Cardiology | Admitting: Cardiology

## 2012-04-10 ENCOUNTER — Telehealth: Payer: Self-pay | Admitting: Family Medicine

## 2012-04-10 ENCOUNTER — Ambulatory Visit (INDEPENDENT_AMBULATORY_CARE_PROVIDER_SITE_OTHER): Payer: Medicare Other | Admitting: Cardiology

## 2012-04-10 ENCOUNTER — Encounter: Payer: Self-pay | Admitting: Cardiology

## 2012-04-10 VITALS — BP 143/86 | HR 71 | Ht 67.0 in | Wt 250.0 lb

## 2012-04-10 DIAGNOSIS — M1710 Unilateral primary osteoarthritis, unspecified knee: Secondary | ICD-10-CM

## 2012-04-10 DIAGNOSIS — R0989 Other specified symptoms and signs involving the circulatory and respiratory systems: Secondary | ICD-10-CM

## 2012-04-10 DIAGNOSIS — I10 Essential (primary) hypertension: Secondary | ICD-10-CM | POA: Insufficient documentation

## 2012-04-10 DIAGNOSIS — E119 Type 2 diabetes mellitus without complications: Secondary | ICD-10-CM | POA: Insufficient documentation

## 2012-04-10 DIAGNOSIS — Z7901 Long term (current) use of anticoagulants: Secondary | ICD-10-CM

## 2012-04-10 DIAGNOSIS — I482 Chronic atrial fibrillation, unspecified: Secondary | ICD-10-CM

## 2012-04-10 DIAGNOSIS — R0602 Shortness of breath: Secondary | ICD-10-CM

## 2012-04-10 DIAGNOSIS — I671 Cerebral aneurysm, nonruptured: Secondary | ICD-10-CM

## 2012-04-10 DIAGNOSIS — I359 Nonrheumatic aortic valve disorder, unspecified: Secondary | ICD-10-CM

## 2012-04-10 DIAGNOSIS — I4891 Unspecified atrial fibrillation: Secondary | ICD-10-CM

## 2012-04-10 NOTE — Patient Instructions (Addendum)
Stop taking Coumadin (Warfarin) 5 days before injection and resume the the evening of procedure.  Your physician recommends that you continue on your current medications as directed. Please refer to the Current Medication list given to you today.  Your physician recommends that you complete 3 hemoccult cards and return  them to office in envelope   A chest x-ray takes a picture of the organs and structures inside the chest, including the heart, lungs, and blood vessels. This test can show several things, including, whether the heart is enlarges; whether fluid is building up in the lungs; and whether pacemaker / defibrillator leads are still in place.  Your physician recommends that you return for lab work in: today  Your physician recommends that you schedule a follow-up appointment in: 1 year

## 2012-04-10 NOTE — Progress Notes (Signed)
Patient ID: Kristin Logan, female   DOB: 06-09-1932, 78 y.o.   MRN: [id number]  HPI: Scheduled return visit for this very nice woman with prior aortic valve replacement for aortic stenosis and chronic atrial fibrillation requiring anticoagulation.  Since she was last seen, she has done very well from a cardiac standpoint.  She is inactive with chronic class II dyspnea on exertion.   She suffered a fall a few weeks ago resulting in a spinal fracture that is being treated with analgesics with plans for epidural injections.  She has noted some chest congestion and excess sputum production, but no signs or symptoms of congestive heart failure.  Prior to Admission medications   Medication Sig Start Date End Date Taking? Authorizing Provider  ACCU-CHEK FASTCLIX LANCETS MISC 1 each by Does not apply route daily. Use as directed with Accuchek meter once daily 07/21/11  Yes Kerri Perches, MD  Acetaminophen (TYLENOL ARTHRITIS PAIN PO) PRN    Yes Historical Provider, MD  albuterol (PROVENTIL HFA;VENTOLIN HFA) 108 (90 BASE) MCG/ACT inhaler Inhale 2 puffs into the lungs every 4 (four) hours as needed for wheezing. 02/21/12 02/20/13 Yes Salley Scarlet, MD  amLODipine (NORVASC) 5 MG tablet TAKE ONE TABLET DAILY FOR BLOOD PRESSURE. 10/27/11  Yes Salley Scarlet, MD  anastrozole (ARIMIDEX) 1 MG tablet Take 1 tablet (1 mg total) by mouth daily. For breast cancer 03/15/12  Yes Salley Scarlet, MD  calcium carbonate (OS-CAL) 600 MG TABS Take 600 mg by mouth daily.   Yes Historical Provider, MD  citalopram (CELEXA) 40 MG tablet Take 1 tablet (40 mg total) by mouth daily. Between 5 and 7 pm, for Depression 03/15/12  Yes Salley Scarlet, MD  clotrimazole-betamethasone (LOTRISONE) cream Apply to affected area 2 times daily 02/20/12 02/19/13 Yes Salley Scarlet, MD  COUMADIN 5 MG tablet TAKE 1 TABLET BY MOUTH DAILY AT 5PM AS DIRECTED. 12/13/11  Yes Salley Scarlet, MD  diclofenac sodium (VOLTAREN) 1 % GEL Apply 1  application topically as needed (for knee and joint pain). 02/05/12  Yes Salley Scarlet, MD  fluconazole (DIFLUCAN) 150 MG tablet Take 1 dose then repeat in 3 days (after antibiotics 03/26/12  Yes Salley Scarlet, MD  furosemide (LASIX) 40 MG tablet Take 1 tablet (40 mg total) by mouth daily. For fluid 07/21/11  Yes Salley Scarlet, MD  glimepiride (AMARYL) 1 MG tablet Take 1 tablet (1 mg total) by mouth daily before breakfast. For diabetes 10/27/11  Yes Salley Scarlet, MD  Krill Oil 500 MG CAPS Take 1 capsule by mouth daily.   Yes Historical Provider, MD  levothyroxine (SYNTHROID) 50 MCG tablet Take 1 tablet (50 mcg total) by mouth daily. For thyroid. Take 30 minutes before other AM medications 02/05/12 02/04/13 Yes Salley Scarlet, MD  lisinopril (PRINIVIL,ZESTRIL) 40 MG tablet Take 1 tablet (40 mg total) by mouth daily. For blood pressure 03/15/12  Yes Salley Scarlet, MD  LORazepam (ATIVAN) 0.5 MG tablet Take 1 tablet (0.5 mg total) by mouth 2 (two) times daily. 12/04/11 12/03/12 Yes Salley Scarlet, MD  metoprolol succinate (TOPROL-XL) 25 MG 24 hr tablet One tab by mouth daily for blood pressure 12/05/11  Yes Salley Scarlet, MD  Multiple Vitamin (MULTIVITAMIN) tablet Take 1 tablet by mouth daily.   Yes Historical Provider, MD  omeprazole (PRILOSEC) 20 MG capsule Take 1 capsule (20 mg total) by mouth 2 (two) times daily. For stomach acid 07/21/11  Yes Velna Hatchet [place],  MD  oxyCODONE-acetaminophen (PERCOCET) 7.5-325 MG per tablet Take 1 tablet by mouth every 6 (six) hours as needed for pain. 04/02/12 04/02/13 Yes Salley Scarlet, MD  polyethylene glycol powder [facility]) powder 1 cap full in clear liquid daily, hold for loose stools 02/05/12  Yes Salley Scarlet, MD  potassium chloride SA (K-DUR,KLOR-CON) 20 MEQ tablet Take 1 tablet (20 mEq total) by mouth daily. For potassium and fluid pill 07/21/11  Yes Salley Scarlet, MD  simvastatin (ZOCOR) 20 MG tablet Take 1 tablet (20 mg total) by mouth at bedtime.  10/27/11  Yes Salley Scarlet, MD  glucose blood (ACCU-CHEK AVIVA) test strip Use as instructed x once daily  Dx 250.00 07/21/11   Kerri Perches, MD   Allergies  Allergen Reactions  . Nsaids     Bleeding ulcer  . Codeine      Past medical history, social history, and family history reviewed and updated.  ROS: She has experienced no lightheadedness, syncope, chest discomfort, orthopnea nor PND.   All other systems reviewed and are negative.  PHYSICAL EXAM: BP 143/86  Pulse 71  Ht 5\' 7"  (1.702 m)  Wt 113.399 kg (250 lb)  BMI 39.16 kg/m2  General-Well developed; no acute distress Body habitus-moderately overweight Neck-No JVD; Bilateral carotid bruits vs transmitted murmur, more prominent on the right Lungs-clear lung fields; resonant to percussion Cardiovascular-normal PMI; normal S1 and S2; grade 1-2/6 systolic ejection murmur at the cardiac base; mildly irregular Abdomen-normal bowel sounds; soft and non-tender without masses or organomegaly Musculoskeletal-No deformities, no cyanosis or clubbing Neurologic-Normal cranial nerves; symmetric strength and tone Skin-Warm, no significant lesions Extremities-distal pulses intact; 1/2+ edema  MRI/MRA of cerebral circulation performed 04/2012: Mild atherosclerosis at the carotid siphon w/o any significant extracranial or intracranial obstruction.  ASSESSMENT AND PLAN:  Shoreham Bing, MD 04/10/2012 3:32 PM

## 2012-04-10 NOTE — Assessment & Plan Note (Addendum)
Blood pressure control appears adequate.  Current medications will be continued.  Appropriate laboratory studies have been ordered.  A chest x-ray and BNP level will also be obtained to evaluate questionable symptoms of chest congestion.

## 2012-04-10 NOTE — Assessment & Plan Note (Signed)
Expected lifetime of valve is at least 10-20 years.  In the absence of infection, I do not anticipate patient experiencing problems related to valvular disease in the foreseeable future.

## 2012-04-10 NOTE — Assessment & Plan Note (Addendum)
Small ACA aneurysm.

## 2012-04-10 NOTE — Progress Notes (Deleted)
Name: Kristin Logan    DOB: 1932-03-19  Age: 78 y.o.  MR#: [id number]       PCP:  [name], MD      Insurance: @PAYORNAME @   CC:    Chief Complaint  Patient presents with  . 9 mt follow up    + med list    VS BP 143/86  Pulse 71  Ht 5\' 7"  (1.702 m)  Wt 250 lb (113.399 kg)  BMI 39.16 kg/m2  Weights Current Weight  04/10/12 250 lb (113.399 kg)  04/08/12 257 lb 6.4 oz (116.756 kg)  04/02/12 256 lb 1.9 oz (116.175 kg)    Blood Pressure  BP Readings from Last 3 Encounters:  04/10/12 143/86  04/08/12 130/70  04/02/12 118/62     Admit date:  (Not on file) Last encounter with RMR:  02/22/2012   Allergy Allergies  Allergen Reactions  . Nsaids     Bleeding ulcer  . Codeine     Current Outpatient Prescriptions  Medication Sig Dispense Refill  . ACCU-CHEK FASTCLIX LANCETS MISC 1 each by Does not apply route daily. Use as directed with Accuchek meter once daily  102 each  6  . Acetaminophen (TYLENOL ARTHRITIS PAIN PO) PRN       . albuterol (PROVENTIL HFA;VENTOLIN HFA) 108 (90 BASE) MCG/ACT inhaler Inhale 2 puffs into the lungs every 4 (four) hours as needed for wheezing.  1 Inhaler  2  . amLODipine (NORVASC) 5 MG tablet TAKE ONE TABLET DAILY FOR BLOOD PRESSURE.  30 tablet  6  . anastrozole (ARIMIDEX) 1 MG tablet Take 1 tablet (1 mg total) by mouth daily. For breast cancer  30 tablet  3  . calcium carbonate (OS-CAL) 600 MG TABS Take 600 mg by mouth daily.      . citalopram (CELEXA) 40 MG tablet Take 1 tablet (40 mg total) by mouth daily. Between 5 and 7 pm, for Depression  30 tablet  3  . clotrimazole-betamethasone (LOTRISONE) cream Apply to affected area 2 times daily  15 g  1  . COUMADIN 5 MG tablet TAKE 1 TABLET BY MOUTH DAILY AT 5PM AS DIRECTED.  40 each  6  . diclofenac sodium (VOLTAREN) 1 % GEL Apply 1 application topically as needed (for knee and joint pain).  100 g  3  . fluconazole (DIFLUCAN) 150 MG tablet Take 1 dose then repeat in 3 days (after antibiotics  2  tablet  1  . furosemide (LASIX) 40 MG tablet Take 1 tablet (40 mg total) by mouth daily. For fluid  30 tablet  6  . glimepiride (AMARYL) 1 MG tablet Take 1 tablet (1 mg total) by mouth daily before breakfast. For diabetes  30 tablet  6  . Krill Oil 500 MG CAPS Take 1 capsule by mouth daily.      Marland Kitchen levothyroxine (SYNTHROID) 50 MCG tablet Take 1 tablet (50 mcg total) by mouth daily. For thyroid. Take 30 minutes before other AM medications  30 tablet  2  . lisinopril (PRINIVIL,ZESTRIL) 40 MG tablet Take 1 tablet (40 mg total) by mouth daily. For blood pressure  30 tablet  3  . LORazepam (ATIVAN) 0.5 MG tablet Take 1 tablet (0.5 mg total) by mouth 2 (two) times daily.  60 tablet  1  . metoprolol succinate (TOPROL-XL) 25 MG 24 hr tablet One tab by mouth daily for blood pressure  30 tablet  3  . Multiple Vitamin (MULTIVITAMIN) tablet Take 1 tablet by mouth daily.      Marland Kitchen  omeprazole (PRILOSEC) 20 MG capsule Take 1 capsule (20 mg total) by mouth 2 (two) times daily. For stomach acid  60 capsule  6  . oxyCODONE-acetaminophen (PERCOCET) 7.5-325 MG per tablet Take 1 tablet by mouth every 6 (six) hours as needed for pain.  45 tablet  0  . polyethylene glycol powder (MIRALAX) powder 1 cap full in clear liquid daily, hold for loose stools  850 g  6  . potassium chloride SA (K-DUR,KLOR-CON) 20 MEQ tablet Take 1 tablet (20 mEq total) by mouth daily. For potassium and fluid pill  30 tablet  6  . simvastatin (ZOCOR) 20 MG tablet Take 1 tablet (20 mg total) by mouth at bedtime.  30 tablet  6  . glucose blood (ACCU-CHEK AVIVA) test strip Use as instructed x once daily  Dx 250.00  50 each  6    Discontinued Meds:   There are no discontinued medications.  Patient Active Problem List  Diagnosis  . DIABETES MELLITUS, TYPE II  . HYPERLIPIDEMIA  . Eosinophilia  . HYPERTENSION  . Atrial fibrillation  . GERD  . DEGENERATIVE JOINT DISEASE, KNEES, BILATERAL  . BREAST CANCER, HX OF  . Chronic anticoagulation  .  Sleep apnea  . Aortic valve disease  . Claudication  . Muscle spasms of lower extremity  . Obesity  . Depression  . Hypothyroidism  . Bronchitis  . Gait instability  . Difficulty hearing  . Post-menopausal bleeding  . Concussion  . Compression fracture of L1 lumbar vertebra  . Elbow pain  . Brain aneurysm    LABS Anti-coag visit on 04/08/2012  Component Date Value  . INR 04/08/2012 3.4   Office Visit on 03/26/2012  Component Date Value  . Culture 03/26/2012 KLEBSIELLA PNEUMONIAE   . Colony Count 03/26/2012 >=100,000 COLONIES/ML   . Organism ID, Bacteria 03/26/2012 KLEBSIELLA PNEUMONIAE   . Color, UA 03/26/2012 yellow   . Clarity, UA 03/26/2012 clear   . Glucose, UA 03/26/2012 neg   . Bilirubin, UA 03/26/2012 neg   . Ketones, UA 03/26/2012 neg   . Spec Grav, UA 03/26/2012 1.010   . Blood, UA 03/26/2012 small   . pH, UA 03/26/2012 7.0   . Protein, UA 03/26/2012 neg   . Urobilinogen, UA 03/26/2012 0.2   . Nitrite, UA 03/26/2012 neg   . Leukocytes, UA 03/26/2012 Trace   Anti-coag visit on 03/13/2012  Component Date Value  . INR 03/13/2012 2.8   Anti-coag visit on 02/28/2012  Component Date Value  . INR 02/28/2012 1.7   Anti-coag visit on 02/22/2012  Component Date Value  . INR 02/22/2012 3.2   Office Visit on 02/20/2012  Component Date Value  . Fecal Occult Blood, POC 02/20/2012 Negative   Anti-coag visit on 02/16/2012  Component Date Value  . INR 02/16/2012 4.3      Results for this Opt Visit:     Results for orders placed in visit on 04/08/12  POCT INR      Component Value Range   INR 3.4      EKG Orders placed in visit on 06/26/11  . EKG 12-LEAD     Prior Assessment and Plan Problem List as of 04/10/2012            Cardiology Problems   HYPERLIPIDEMIA   Last Assessment & Plan Note   10/26/2011 Office Visit Signed 10/27/2011  8:17 AM by Salley Scarlet, MD    Check FLP, continue Zocor, dose change upon results    HYPERTENSION  Last  Assessment & Plan Note   02/05/2012 Office Visit Addendum 02/06/2012  8:55 AM by Salley Scarlet, MD    No change to meds, above goal, if this persist consider increasing to 10mg  norvasc    Atrial fibrillation   Last Assessment & Plan Note   02/05/2012 Office Visit Signed 02/06/2012  8:55 AM by Salley Scarlet, MD    Followed by coumadin clinic, sinus rhythm today    Aortic valve disease   Brain aneurysm   Last Assessment & Plan Note   04/08/2012 Office Visit Signed 04/08/2012 11:05 AM by Salley Scarlet, MD    No evidence of acute bleed, headaches resolving s/p concussion. Will send to neurosurgery for evaluation, I think this was probably an incidental finding      Other   DIABETES MELLITUS, TYPE II   Last Assessment & Plan Note   02/05/2012 Office Visit Signed 02/06/2012  8:52 AM by Salley Scarlet, MD    Well controlled no change to meds    Eosinophilia   GERD   Last Assessment & Plan Note   07/21/2011 Office Visit Signed 07/23/2011 12:17 PM by Salley Scarlet, MD    Increase PPI, to BID dosing    DEGENERATIVE JOINT DISEASE, KNEES, BILATERAL   Last Assessment & Plan Note   02/05/2012 Office Visit Signed 02/06/2012  8:56 AM by Salley Scarlet, MD    Unchanged, tylenol arthritis to be continued     BREAST CANCER, HX OF   Last Assessment & Plan Note   02/05/2012 Office Visit Signed 02/06/2012  9:01 AM by Salley Scarlet, MD    Pt was contacted for her follow-up at cancer center, will set up Mammogram.  They will need to discuss stopping the arimidex    Chronic anticoagulation   Last Assessment & Plan Note   02/20/2012 Office Visit Signed 02/21/2012  6:43 PM by Salley Scarlet, MD    Her INR had been stable, though I failed to see who was monitoring her coumadin levels, supratherapuetic at recent check, This is to be rechecked in a few days, will call [facility] to see if I or cardiology will be following, previously cardiology following but she had not been to coumadin clinic since the end of 2012.      Sleep apnea   Last Assessment & Plan Note   10/26/2011 Office Visit Signed 10/27/2011  8:19 AM by Salley Scarlet, MD    CPAP used on regular basis, her symptoms have improved a lot    Claudication   Last Assessment & Plan Note   08/01/2011 Office Visit Signed 08/01/2011  5:34 PM by Jodelle Gross, NP    I do not think she is experiencing intermittent claudication symptoms with pain improving with walking and worse with rest. The pain travels to her thighs and is cramping. Doppler ultrasound has normal sounds without bruit. No further testing is necessary at this time. Continue current medications.     Muscle spasms of lower extremity   Last Assessment & Plan Note   08/01/2011 Office Visit Signed 08/01/2011  5:33 PM by Jodelle Gross, NP    I do not think she is experiencing intermittent claudication symptoms with pain improving with walking and worse with rest. The pain travels to her thighs and is cramping. Doppler ultrasound has normal sounds without bruit.    Obesity   Last Assessment & Plan Note   12/04/2011 Office Visit Signed 12/05/2011  5:19 PM  by Salley Scarlet, MD    Pt has now declined nutrition referral, we discussed in detail foods to avoid, and any upper body exercise she can tolerate    Depression   Last Assessment & Plan Note   02/05/2012 Office Visit Signed 02/06/2012  8:57 AM by Salley Scarlet, MD    Improved some with addition of ativan, she is lonely as she lives along with exception of aide during the day, her daughter has now decided to come back and stay with her on and off.  No change to meds    Hypothyroidism   Last Assessment & Plan Note   02/05/2012 Office Visit Signed 02/06/2012  8:57 AM by Salley Scarlet, MD    TSH now within normal limits, continue synthroid    Bronchitis   Last Assessment & Plan Note   02/23/2012 Office Visit Signed 02/23/2012  3:40 PM by Salley Scarlet, MD    Doing very well, complete course of steroids and antibiotics     Gait instability   Difficulty hearing   Last Assessment & Plan Note   02/05/2012 Office Visit Signed 02/06/2012  8:59 AM by Salley Scarlet, MD    Will be sent to audiology, pt willing to wear aides    Post-menopausal bleeding   Concussion   Last Assessment & Plan Note   04/08/2012 Office Visit Signed 04/08/2012 11:01 AM by Salley Scarlet, MD    Mentation improving     Compression fracture of L1 lumbar vertebra   Last Assessment & Plan Note   04/08/2012 Office Visit Signed 04/08/2012 11:02 AM by Salley Scarlet, MD    Pt to see Dr.Bethea tomorrow for evaluation and treatment , acetaminophen and narcotic medications given     Elbow pain   Last Assessment & Plan Note   04/02/2012 Office Visit Signed 04/02/2012 10:27 PM by Salley Scarlet, MD    Xray negative, likely soft tissue bruising        Imaging: Dg Lumbar Spine 2-3 Views  04/02/2012  *RADIOLOGY REPORT*  Clinical Data:  Low back pain, tail bone pain, fall 5 days ago  LUMBAR SPINE - 2-3 VIEW  Comparison: 05/27/2008  Findings: Diffuse osseous demineralization. Five non-rib bearing lumbar vertebrae. New superior endplate compression fracture of L1 with mild anterior height loss. Progressive anterior compression fracture of L2 increased since previous exam. Subtle old superior endplate compression deformity anteriorly at T12. Disc space narrowing with vacuum phenomenon endplate spur formation L5-S1. Scattered end plate spurs throughout remaining lumbar spine. Question minimal retrolisthesis at L2-L3. Facet degenerative changes lower lumbar spine. Atherosclerotic calcifications aorta and iliac arteries. SI joints symmetric.  IMPRESSION: New L1 and progressive L2 compression fractures since prior exam. Osseous demineralization. Mild old superior endplate compression deformity T12. Multilevel degenerative disc and facet disease changes.  Original Report Authenticated By: Lollie Marrow, M.D.   Dg Elbow Complete Left  04/02/2012  *RADIOLOGY REPORT*   Clinical Data: Left elbow pain post fall  LEFT ELBOW - COMPLETE 3+ VIEW  Comparison: None  Findings: Osseous demineralization. Joint space narrowing particularly at radiocarpal joint. Significant spur formation at radial head. Multiple anterior spurs at elbow joint. Small elbow joint effusion present. No definite acute fracture, dislocation, or bone destruction. Arterial calcifications in the forearm.  IMPRESSION: Advanced elbow joint degenerative changes with elbow joint effusion. No definite acute bony abnormalities.  Original Report Authenticated By: Lollie Marrow, M.D.   Dg Hip Bilateral Vito Berger  04/02/2012  *RADIOLOGY REPORT*  Clinical Data: Hip pain post fall  BILATERAL HIP WITH PELVIS - 4+ VIEW  Comparison: None  Findings: Osseous demineralization. Symmetric hip and SI joints. Scattered vascular calcifications. Mild degenerative facet disease changes lower lumbar spine. No acute fracture, dislocation, or bone destruction.  IMPRESSION: Osseous demineralization. No acute abnormalities.  Original Report Authenticated By: Lollie Marrow, M.D.   Ct Head Wo Contrast  04/02/2012  *RADIOLOGY REPORT*  Clinical Data: Larey Seat 5 days ago, headache, dizziness, on Coumadin  CT HEAD WITHOUT CONTRAST  Technique:  Contiguous axial images were obtained from the base of the skull through the vertex without contrast.  Comparison: None  Findings: Generalized atrophy. Normal ventricular morphology. No midline shift or mass effect. Small vessel chronic ischemic changes of deep cerebral white matter. Minimal basal ganglia calcification bilaterally. No intracranial hemorrhage or evidence of acute infarction. A small rounded intermediate attenuation focus 4.5 mm diameter is seen at the base of the interhemispheric fissure anteriorly at the expected position of the anterior cerebral arteries. Small aneurysm is questioned. Atherosclerotic calcification of internal carotid arteries at skull base. Bones appear demineralized. Visualized  paranasal sinuses and mastoid air cells clear.  IMPRESSION: Atrophy with small vessel chronic ischemic changes of deep cerebral white matter. No acute intracranial abnormalities. 4.5 mm diameter hyperdense focus at base of interhemispheric fissure at/near the anterior cerebral arteries question aneurysm. Recommend MRA head without contrast for further evaluation.  Findings called to [name] on 04/02/2012 at 1243 hours.  Original Report Authenticated By: Lollie Marrow, M.D.   Mr Angiogram Head Wo Contrast  04/05/2012  *RADIOLOGY REPORT*  Clinical Data: Abnormal head CT.  Possible anterior communicating aneurysm.  Acute but ill-defined vascular disease.  MRA HEAD WITHOUT CONTRAST  Technique: Angiographic images of the Circle of Willis were obtained using MRA technique without intravenous contrast.  Comparison: Head CT 04/02/2012  Findings:  Both internal carotid arteries are widely patent into the brain.  There is mild atherosclerotic irregularity in the siphon regions.  The anterior and middle cerebral vessels are patent proximally without stenosis or occlusion.  There is an anterior communicating artery aneurysm measuring 6 mm in diameter. No other intracranial aneurysm.  Both vertebral arteries are patent to the basilar.  No basilar stenosis.  Posterior cerebral arteries are patent bilaterally with mild atherosclerotic change distally.  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  Original Report Authenticated By: Thomasenia Sales, M.D.     [facility] Calculation: Score not calculated. Missing: Total Cholesterol

## 2012-04-10 NOTE — Assessment & Plan Note (Signed)
Stable and therapeutic anticoagulation.  We will continue to monitor INRs and to exclude GI blood loss with serial CBCs and stool for Hemoccult testing.  Primary care physician may wish to verify the date of previous colonoscopy and determine whether another screening study would be appropriate.

## 2012-04-10 NOTE — Assessment & Plan Note (Signed)
Atrial fibrillation is chronic and does not appear to be causing any symptoms or other problems at present.  We will persist in our strategy of control of heart rate and anticoagulation.

## 2012-04-12 LAB — COMPREHENSIVE METABOLIC PANEL

## 2012-04-15 ENCOUNTER — Ambulatory Visit (INDEPENDENT_AMBULATORY_CARE_PROVIDER_SITE_OTHER): Payer: Medicare Other | Admitting: *Deleted

## 2012-04-15 ENCOUNTER — Other Ambulatory Visit: Payer: Self-pay | Admitting: *Deleted

## 2012-04-15 ENCOUNTER — Other Ambulatory Visit: Payer: Self-pay

## 2012-04-15 DIAGNOSIS — I10 Essential (primary) hypertension: Secondary | ICD-10-CM

## 2012-04-15 DIAGNOSIS — I482 Chronic atrial fibrillation, unspecified: Secondary | ICD-10-CM

## 2012-04-15 DIAGNOSIS — Z7901 Long term (current) use of anticoagulants: Secondary | ICD-10-CM

## 2012-04-15 DIAGNOSIS — I4891 Unspecified atrial fibrillation: Secondary | ICD-10-CM

## 2012-04-15 MED ORDER — CLOTRIMAZOLE-BETAMETHASONE 1-0.05 % EX CREA: TOPICAL_CREAM | CUTANEOUS | Status: DC

## 2012-04-16 ENCOUNTER — Telehealth: Payer: Self-pay | Admitting: *Deleted

## 2012-04-16 NOTE — Telephone Encounter (Signed)
Pts daughter was notified on 7/15 that CMET was not drawn by the lab and she needed to have drawn asap.  Lab slip faxed to [facility].  Verbalized understanding.

## 2012-04-17 ENCOUNTER — Telehealth: Payer: Self-pay

## 2012-04-17 MED ORDER — AZITHROMYCIN 250 MG PO TABS: ORAL_TABLET | ORAL | Status: AC

## 2012-04-17 NOTE — Telephone Encounter (Signed)
Her chest xray showed mild bronchitis, use the mucinex, Z pak sent

## 2012-04-17 NOTE — Telephone Encounter (Signed)
Daughter aware.

## 2012-04-23 ENCOUNTER — Other Ambulatory Visit: Payer: Self-pay

## 2012-04-23 MED ORDER — METOPROLOL SUCCINATE ER 25 MG PO TB24: ORAL_TABLET | ORAL | Status: DC

## 2012-04-23 NOTE — Telephone Encounter (Signed)
Patient is aware 

## 2012-04-25 ENCOUNTER — Encounter: Payer: Self-pay | Admitting: Family Medicine

## 2012-04-25 ENCOUNTER — Ambulatory Visit (INDEPENDENT_AMBULATORY_CARE_PROVIDER_SITE_OTHER): Payer: Medicare Other | Admitting: Otolaryngology

## 2012-04-25 ENCOUNTER — Ambulatory Visit (INDEPENDENT_AMBULATORY_CARE_PROVIDER_SITE_OTHER): Payer: Medicare Other | Admitting: Family Medicine

## 2012-04-25 ENCOUNTER — Ambulatory Visit (INDEPENDENT_AMBULATORY_CARE_PROVIDER_SITE_OTHER): Payer: Medicare Other | Admitting: *Deleted

## 2012-04-25 VITALS — BP 130/64 | HR 71 | Resp 16 | Ht 65.5 in | Wt 250.0 lb

## 2012-04-25 DIAGNOSIS — I10 Essential (primary) hypertension: Secondary | ICD-10-CM

## 2012-04-25 DIAGNOSIS — E119 Type 2 diabetes mellitus without complications: Secondary | ICD-10-CM

## 2012-04-25 DIAGNOSIS — T148XXA Other injury of unspecified body region, initial encounter: Secondary | ICD-10-CM

## 2012-04-25 DIAGNOSIS — I671 Cerebral aneurysm, nonruptured: Secondary | ICD-10-CM

## 2012-04-25 DIAGNOSIS — Z853 Personal history of malignant neoplasm of breast: Secondary | ICD-10-CM

## 2012-04-25 DIAGNOSIS — I482 Chronic atrial fibrillation, unspecified: Secondary | ICD-10-CM

## 2012-04-25 DIAGNOSIS — IMO0002 Reserved for concepts with insufficient information to code with codable children: Secondary | ICD-10-CM

## 2012-04-25 DIAGNOSIS — I4891 Unspecified atrial fibrillation: Secondary | ICD-10-CM

## 2012-04-25 DIAGNOSIS — H903 Sensorineural hearing loss, bilateral: Secondary | ICD-10-CM

## 2012-04-25 DIAGNOSIS — Z7901 Long term (current) use of anticoagulants: Secondary | ICD-10-CM

## 2012-04-25 LAB — POCT INR: INR: 1.4

## 2012-04-25 MED ORDER — OXYCODONE-ACETAMINOPHEN 7.5-325 MG PO TABS: 1.0000 | ORAL_TABLET | Freq: Four times a day (QID) | ORAL | Status: DC | PRN

## 2012-04-25 NOTE — Assessment & Plan Note (Signed)
Seen by Dr. Eduard Clos, no intervention. Pain control, given refill on pain meds today

## 2012-04-25 NOTE — Assessment & Plan Note (Signed)
Well controlled 

## 2012-04-25 NOTE — Progress Notes (Signed)
  Subjective:    Patient ID: Kristin Logan, female    DOB: 1931-10-12, 78 y.o.   MRN: [id number]  HPI Pt here to f/u concussion, anuserym and compression fracture Patient seen by neurosurgery for a brain aneurysm was advised that he would not surgically intervene secondary to her age and comorbidities. She's had some mild headaches but they're much improved since her initial concussion. She takes 2 Tylenol and it is relieved. She's not taking both the Tylenol and Percocet the same time Compression fracture she was seen by Dr. Eduard Clos  No kyphoplasty done, decision was made to   use epidural injections for her chronic back pain. DM- has not been taking CBG as previous, taking glimepiride daily, would like diabetic shoes She has hearing exam today with Dr. Suszanne Conners  Review of Systems    GEN- denies fatigue, fever, weight loss,weakness, recent illness HEENT- denies eye drainage, change in vision, nasal discharge, CVS- denies chest pain, palpitations RESP- denies SOB, cough, wheeze ABD- denies N/V, change in stools, abd pain GU- denies dysuria, hematuria, dribbling, incontinence MSK- + joint pain, +muscle aches, injury Neuro- denies headache, dizziness, syncope, seizure activity     Objective:   Physical Exam GEN- NAD, obese, in rolling walker, alert and oriented x 3 , HEENT-PEERL, EOMI , oropharynx clear, fundoscopic exam benign CVS- RRR, 2/6 SEM RESP-normal WOB, CTAB ABD-NABS,soft,NT,ND Neuro-CNII-XII in tact, responds appropriately, moving all 4 ext , no focal deficits Ext- no edema Diabetic foot exam- done- decreased monofilament,no open lesions, thick nails        Assessment & Plan:

## 2012-04-25 NOTE — Patient Instructions (Signed)
Continue pain medication as needed Get A1C drawn with your coumadin check Diabetic shoes to be sent to [employer]  F/U 2 months

## 2012-04-25 NOTE — Assessment & Plan Note (Signed)
No intervention, seen by neurosurgery, note will be obtained

## 2012-04-25 NOTE — Assessment & Plan Note (Signed)
She has some neuropathy in feet Check A1C and titrate medicaitons Diabetic shoe request sent

## 2012-04-30 ENCOUNTER — Telehealth: Payer: Self-pay | Admitting: Family Medicine

## 2012-04-30 NOTE — Telephone Encounter (Signed)
Is her pain meds due? Looks like she was just given 45 on the 25th of July

## 2012-04-30 NOTE — Telephone Encounter (Signed)
I just gave her a prescription on the 25th, they were suppose to get that filled.

## 2012-05-01 ENCOUNTER — Telehealth: Payer: Self-pay | Admitting: Family Medicine

## 2012-05-01 MED ORDER — HYDROCODONE-ACETAMINOPHEN 5-500 MG PO TABS: 1.0000 | ORAL_TABLET | Freq: Four times a day (QID) | ORAL | Status: DC | PRN

## 2012-05-01 NOTE — Telephone Encounter (Signed)
Called in vicodin 5-500mg  , please let pt know

## 2012-05-01 NOTE — Telephone Encounter (Signed)
Her daughter states they do not have the rx. She was reading off the papers that they received at the office and she states they did not get the rx. She is going to come by in the morning with exactly what they were given but Jarae has been in so much pain the past couple days and she doesn't know how she is going to rest tonight. Wants to know if something can be called in for her for tonight so she can get some relief. Please advise and send me back a message if you get this.

## 2012-05-01 NOTE — Telephone Encounter (Signed)
Called and left message for daughter notifying her of med.

## 2012-05-02 NOTE — Telephone Encounter (Signed)
Have you ordered diabetic shoes for her?

## 2012-05-02 NOTE — Telephone Encounter (Signed)
Her shoes were sent to apothecary, this takes time for them to review.  Vicodin was sent yesterday

## 2012-05-06 ENCOUNTER — Encounter (INDEPENDENT_AMBULATORY_CARE_PROVIDER_SITE_OTHER): Payer: Medicare Other

## 2012-05-06 ENCOUNTER — Other Ambulatory Visit (INDEPENDENT_AMBULATORY_CARE_PROVIDER_SITE_OTHER): Payer: Medicare Other | Admitting: *Deleted

## 2012-05-06 DIAGNOSIS — R0989 Other specified symptoms and signs involving the circulatory and respiratory systems: Secondary | ICD-10-CM

## 2012-05-06 DIAGNOSIS — Z7901 Long term (current) use of anticoagulants: Secondary | ICD-10-CM

## 2012-05-06 LAB — POC HEMOCCULT BLD/STL (HOME/3-CARD/SCREEN): Card #2 Fecal Occult Blod, POC: NEGATIVE

## 2012-05-15 ENCOUNTER — Ambulatory Visit (INDEPENDENT_AMBULATORY_CARE_PROVIDER_SITE_OTHER): Payer: Medicare Other | Admitting: *Deleted

## 2012-05-15 DIAGNOSIS — I482 Chronic atrial fibrillation, unspecified: Secondary | ICD-10-CM

## 2012-05-15 DIAGNOSIS — I4891 Unspecified atrial fibrillation: Secondary | ICD-10-CM

## 2012-05-15 DIAGNOSIS — Z7901 Long term (current) use of anticoagulants: Secondary | ICD-10-CM

## 2012-05-15 LAB — POCT INR: INR: 1.8

## 2012-05-21 ENCOUNTER — Other Ambulatory Visit: Payer: Self-pay

## 2012-05-21 MED ORDER — FUROSEMIDE 40 MG PO TABS: 40.0000 mg | ORAL_TABLET | Freq: Every day | ORAL | Status: DC

## 2012-05-21 MED ORDER — LEVOTHYROXINE SODIUM 50 MCG PO TABS: 50.0000 ug | ORAL_TABLET | Freq: Every day | ORAL | Status: DC

## 2012-05-21 MED ORDER — OMEPRAZOLE 20 MG PO CPDR: 20.0000 mg | DELAYED_RELEASE_CAPSULE | Freq: Two times a day (BID) | ORAL | Status: DC

## 2012-05-21 MED ORDER — POTASSIUM CHLORIDE CRYS ER 20 MEQ PO TBCR: 20.0000 meq | EXTENDED_RELEASE_TABLET | Freq: Every day | ORAL | Status: DC

## 2012-05-23 ENCOUNTER — Ambulatory Visit: Payer: Medicare Other | Admitting: Family Medicine

## 2012-05-30 ENCOUNTER — Ambulatory Visit (INDEPENDENT_AMBULATORY_CARE_PROVIDER_SITE_OTHER): Payer: Medicare Other | Admitting: *Deleted

## 2012-05-30 DIAGNOSIS — Z7901 Long term (current) use of anticoagulants: Secondary | ICD-10-CM

## 2012-05-30 DIAGNOSIS — I482 Chronic atrial fibrillation, unspecified: Secondary | ICD-10-CM

## 2012-05-30 DIAGNOSIS — I4891 Unspecified atrial fibrillation: Secondary | ICD-10-CM

## 2012-05-30 MED ORDER — WARFARIN SODIUM 5 MG PO TABS: 5.0000 mg | ORAL_TABLET | ORAL | Status: DC

## 2012-06-14 ENCOUNTER — Other Ambulatory Visit: Payer: Self-pay | Admitting: Family Medicine

## 2012-06-17 ENCOUNTER — Encounter (HOSPITAL_COMMUNITY): Payer: Self-pay | Admitting: *Deleted

## 2012-06-17 ENCOUNTER — Emergency Department (HOSPITAL_COMMUNITY)
Admission: EM | Admit: 2012-06-17 | Discharge: 2012-06-18 | Disposition: A | Discharge status: Home / Self Care | Payer: Medicare Other | Attending: Emergency Medicine | Admitting: Emergency Medicine

## 2012-06-17 ENCOUNTER — Emergency Department (HOSPITAL_COMMUNITY): Payer: Medicare Other

## 2012-06-17 DIAGNOSIS — W208XXA Other cause of strike by thrown, projected or falling object, initial encounter: Secondary | ICD-10-CM | POA: Insufficient documentation

## 2012-06-17 DIAGNOSIS — Z853 Personal history of malignant neoplasm of breast: Secondary | ICD-10-CM | POA: Insufficient documentation

## 2012-06-17 DIAGNOSIS — K219 Gastro-esophageal reflux disease without esophagitis: Secondary | ICD-10-CM | POA: Insufficient documentation

## 2012-06-17 DIAGNOSIS — S9030XA Contusion of unspecified foot, initial encounter: Secondary | ICD-10-CM | POA: Insufficient documentation

## 2012-06-17 DIAGNOSIS — G4730 Sleep apnea, unspecified: Secondary | ICD-10-CM | POA: Insufficient documentation

## 2012-06-17 DIAGNOSIS — E785 Hyperlipidemia, unspecified: Secondary | ICD-10-CM | POA: Insufficient documentation

## 2012-06-17 DIAGNOSIS — S9031XA Contusion of right foot, initial encounter: Secondary | ICD-10-CM

## 2012-06-17 DIAGNOSIS — I4891 Unspecified atrial fibrillation: Secondary | ICD-10-CM | POA: Insufficient documentation

## 2012-06-17 DIAGNOSIS — Z7901 Long term (current) use of anticoagulants: Secondary | ICD-10-CM | POA: Insufficient documentation

## 2012-06-17 DIAGNOSIS — Z79899 Other long term (current) drug therapy: Secondary | ICD-10-CM | POA: Insufficient documentation

## 2012-06-17 DIAGNOSIS — I10 Essential (primary) hypertension: Secondary | ICD-10-CM | POA: Insufficient documentation

## 2012-06-17 DIAGNOSIS — E119 Type 2 diabetes mellitus without complications: Secondary | ICD-10-CM | POA: Insufficient documentation

## 2012-06-17 NOTE — ED Notes (Signed)
Dropped a bottle of water on rt foot .  Pain and swelling

## 2012-06-18 MED ORDER — HYDROCODONE-ACETAMINOPHEN 5-325 MG PO TABS
1.0000 | ORAL_TABLET | Freq: Once | ORAL | Status: AC
  Administered 2012-06-18: 1 via ORAL
  Filled 2012-06-18: qty 1

## 2012-06-18 MED ORDER — HYDROCODONE-ACETAMINOPHEN 5-325 MG PO TABS: 1.0000 | ORAL_TABLET | Freq: Four times a day (QID) | ORAL | Status: DC | PRN

## 2012-06-18 NOTE — ED Provider Notes (Signed)
Medical screening examination/treatment/procedure(s) were performed by non-physician practitioner and as supervising physician I was immediately available for consultation/collaboration.    Vida Roller, MD 06/18/12 [phone]

## 2012-06-18 NOTE — ED Provider Notes (Signed)
History     CSN: [id number]  Arrival date & time 06/17/12  2140   First MD Initiated Contact with Patient 06/17/12 2303      Chief Complaint  Patient presents with  . Foot Pain    (Consider location/radiation/quality/duration/timing/severity/associated sxs/prior treatment) HPI Comments: Pt dropped a large plastic water bottle on her R foot today.  It has since swollen and become painful.  She is taking coumadin and has an appt in the mornig for an INR check.  She took a narcotic pain pill earlier with good relief.  Patient is a 78 y.o. female presenting with lower extremity pain. The history is provided by the patient. No language interpreter was used.  Foot Pain This is a new problem. The problem occurs constantly. The problem has been unchanged. Pertinent negatives include no chills, fever, numbness or weakness. The symptoms are aggravated by walking and standing. Treatments tried: narcotic pain med. The treatment provided significant relief.    Past Medical History  Diagnosis Date  . Diabetes mellitus   . Hyperlipidemia   . Eosinophilia   . Cataract   . Hypertension   . Atrial fibrillation prior to 2008    moderate biatrial enlargement in 2009; mild to moderate LVH with a low-normal EF  . GERD (gastroesophageal reflux disease)     Hemorrhoids; history of peptic ulcer disease  . DJD (degenerative joint disease) of knee   . Sleep apnea     CPAP  . Chronic anticoagulation   . Aortic valve disease     2009: mild stenosis and regurgitation; peak gradient of 30-35 mmHg , subsequent AVR at [facility]  . Incontinence     Mild  . Carcinoma of breast     Right mastectomy; radiation and hormonal therapy    Past Surgical History  Procedure Date  . Mastectomy partial / lumpectomy w/ axillary lymphadenectomy 2008    Right for carcinoma;  . Exploration post operative open heart   . Colonoscopy     Approximately 2000  . Cardiac valve replacement     [facility]    Family History    Problem Relation Age of Onset  . Heart disease Mother   . Stroke Father   . Cancer Sister   . Heart disease Sister   . Stroke Sister   . Stroke Brother     History  Substance Use Topics  . Smoking status: Never Smoker   . Smokeless tobacco: Not on file  . Alcohol Use: No    OB History    Grav Para Term Preterm Abortions TAB SAB Ect Mult Living                  Review of Systems  Constitutional: Negative for fever and chills.  Musculoskeletal: Negative for back pain.  Skin: Negative for wound.  Neurological: Negative for weakness and numbness.  All other systems reviewed and are negative.    Allergies  Nsaids and Codeine  Home Medications   Current Outpatient Rx  Name Route Sig Dispense Refill  . ACCU-CHEK FASTCLIX LANCETS MISC Does not apply 1 each by Does not apply route daily. Use as directed with Accuchek meter once daily 102 each 6  . TYLENOL ARTHRITIS PAIN PO  PRN     . ALBUTEROL SULFATE HFA 108 (90 BASE) MCG/ACT IN AERS Inhalation Inhale 2 puffs into the lungs every 4 (four) hours as needed for wheezing. 1 Inhaler 2  . AMLODIPINE BESYLATE 5 MG PO TABS  TAKE ONE TABLET  DAILY FOR BLOOD PRESSURE. 30 tablet 6  . CALCIUM CARBONATE 600 MG PO TABS Oral Take 600 mg by mouth daily.    Marland Kitchen CITALOPRAM HYDROBROMIDE 40 MG PO TABS Oral Take 1 tablet (40 mg total) by mouth daily. Between 5 and 7 pm, for Depression 30 tablet 3  . FUROSEMIDE 40 MG PO TABS Oral Take 1 tablet (40 mg total) by mouth daily. For fluid 30 tablet 6  . GLIMEPIRIDE 1 MG PO TABS Oral Take 1 tablet (1 mg total) by mouth daily before breakfast. For diabetes 30 tablet 6  . GLUCOSE BLOOD VI STRP  Use as instructed x once daily  Dx 250.00 50 each 6  . HYDROCODONE-ACETAMINOPHEN 5-325 MG PO TABS Oral Take 1 tablet by mouth every 6 (six) hours as needed for pain. 20 tablet 0  . HYDROCODONE-ACETAMINOPHEN 5-500 MG PO TABS Oral Take 1 tablet by mouth every 6 (six) hours as needed for pain. 45 tablet 0  . KRILL OIL  500 MG PO CAPS Oral Take 1 capsule by mouth daily.    Marland Kitchen LEVOTHYROXINE SODIUM 50 MCG PO TABS Oral Take 1 tablet (50 mcg total) by mouth daily. For thyroid. Take 30 minutes before other AM medications 30 tablet 2  . LISINOPRIL 40 MG PO TABS Oral Take 1 tablet (40 mg total) by mouth daily. For blood pressure 30 tablet 3  . LORAZEPAM 0.5 MG PO TABS Oral Take 1 tablet (0.5 mg total) by mouth 2 (two) times daily. 60 tablet 1  . LOTRISONE 1-0.05 % EX CREA  APPLY TO THE AFEFCTED AREAS TWICE DAILY. 15 g 3  . METOPROLOL SUCCINATE ER 25 MG PO TB24  One tab by mouth daily for blood pressure 30 tablet 3  . ONE-DAILY MULTI VITAMINS PO TABS Oral Take 1 tablet by mouth daily.    Marland Kitchen OMEPRAZOLE 20 MG PO CPDR Oral Take 1 capsule (20 mg total) by mouth 2 (two) times daily. For stomach acid 60 capsule 6  . OXYCODONE-ACETAMINOPHEN 7.5-325 MG PO TABS Oral Take 1 tablet by mouth every 6 (six) hours as needed for pain. 45 tablet 0  . POLYETHYLENE GLYCOL 3350 PO POWD  1 cap full in clear liquid daily, hold for loose stools 850 g 6  . POTASSIUM CHLORIDE CRYS ER 20 MEQ PO TBCR Oral Take 1 tablet (20 mEq total) by mouth daily. For potassium and fluid pill 30 tablet 6  . SIMVASTATIN 20 MG PO TABS Oral Take 1 tablet (20 mg total) by mouth at bedtime. 30 tablet 6  . VOLTAREN 1 % TD GEL  USE 1 APPLICATION TOPICALLY AS NEEDED FOR KNEE AND JOINT PAIN 100 g 3  . WARFARIN SODIUM 5 MG PO TABS Oral Take 1 tablet (5 mg total) by mouth as directed. 50 tablet 3    BP 146/64  Pulse 66  Temp 97.6 F (36.4 C) (Oral)  Resp 18  Ht 5\' 6"  (1.676 m)  Wt 240 lb (108.863 kg)  BMI 38.74 kg/m2  SpO2 97%  Physical Exam  Nursing note and vitals reviewed. Constitutional: She is oriented to person, place, and time. She appears well-developed and well-nourished. No distress.  HENT:  Head: Normocephalic and atraumatic.  Eyes: EOM are normal.  Neck: Normal range of motion.  Cardiovascular: Normal rate, regular rhythm and normal heart sounds.     Pulmonary/Chest: Effort normal and breath sounds normal.  Abdominal: Soft. She exhibits no distension. There is no tenderness.  Musculoskeletal: She exhibits tenderness.  Feet:  Neurological: She is alert and oriented to person, place, and time.  Skin: Skin is warm and dry.  Psychiatric: She has a normal mood and affect. Judgment normal.    ED Course  Procedures (including critical care time)  Labs Reviewed - No data to display Dg Foot Complete Right  06/17/2012  *RADIOLOGY REPORT*  Clinical Data: Blunt injury, pain  RIGHT FOOT COMPLETE - 3+ VIEW  Comparison: None.  Findings: The bones are demineralized.  There is no visible fracture or dislocation however.  IMPRESSION: As above.   Original Report Authenticated By: Elsie Stain, M.D.      1. Contusion of right foot       MDM  No fx Ice, elevation.  Use your walker and bear weight as tolerated.  F/u with dr. Hilda Lias. rx-hydrocodone, 20        Evalina Field, [place] 06/18/12 0021

## 2012-06-19 ENCOUNTER — Ambulatory Visit (INDEPENDENT_AMBULATORY_CARE_PROVIDER_SITE_OTHER): Payer: Medicare Other | Admitting: *Deleted

## 2012-06-19 DIAGNOSIS — Z7901 Long term (current) use of anticoagulants: Secondary | ICD-10-CM

## 2012-06-19 DIAGNOSIS — I4891 Unspecified atrial fibrillation: Secondary | ICD-10-CM

## 2012-06-19 DIAGNOSIS — I482 Chronic atrial fibrillation, unspecified: Secondary | ICD-10-CM

## 2012-06-19 LAB — POCT INR: INR: 2.2

## 2012-06-25 ENCOUNTER — Encounter: Payer: Self-pay | Admitting: Family Medicine

## 2012-06-25 ENCOUNTER — Ambulatory Visit (INDEPENDENT_AMBULATORY_CARE_PROVIDER_SITE_OTHER): Payer: Medicare Other | Admitting: Family Medicine

## 2012-06-25 ENCOUNTER — Ambulatory Visit (HOSPITAL_COMMUNITY)
Admission: RE | Admit: 2012-06-25 | Discharge: 2012-06-25 | Disposition: A | Discharge status: Home / Self Care | Payer: Medicare Other | Source: Ambulatory Visit | Attending: Family Medicine | Admitting: Family Medicine

## 2012-06-25 ENCOUNTER — Ambulatory Visit: Payer: Medicare Other | Admitting: Family Medicine

## 2012-06-25 VITALS — BP 122/70 | HR 72 | Resp 18 | Ht 65.5 in | Wt 241.1 lb

## 2012-06-25 DIAGNOSIS — F3289 Other specified depressive episodes: Secondary | ICD-10-CM

## 2012-06-25 DIAGNOSIS — S8990XA Unspecified injury of unspecified lower leg, initial encounter: Secondary | ICD-10-CM

## 2012-06-25 DIAGNOSIS — F32A Depression, unspecified: Secondary | ICD-10-CM

## 2012-06-25 DIAGNOSIS — S9030XA Contusion of unspecified foot, initial encounter: Secondary | ICD-10-CM

## 2012-06-25 DIAGNOSIS — E119 Type 2 diabetes mellitus without complications: Secondary | ICD-10-CM

## 2012-06-25 DIAGNOSIS — S99929A Unspecified injury of unspecified foot, initial encounter: Secondary | ICD-10-CM

## 2012-06-25 DIAGNOSIS — F329 Major depressive disorder, single episode, unspecified: Secondary | ICD-10-CM

## 2012-06-25 DIAGNOSIS — J40 Bronchitis, not specified as acute or chronic: Secondary | ICD-10-CM

## 2012-06-25 DIAGNOSIS — M25579 Pain in unspecified ankle and joints of unspecified foot: Secondary | ICD-10-CM | POA: Insufficient documentation

## 2012-06-25 DIAGNOSIS — I10 Essential (primary) hypertension: Secondary | ICD-10-CM

## 2012-06-25 DIAGNOSIS — E039 Hypothyroidism, unspecified: Secondary | ICD-10-CM

## 2012-06-25 HISTORY — DX: Unspecified injury of unspecified foot, initial encounter: S99.929A

## 2012-06-25 LAB — CBC
HCT: 38.6 % (ref 36.0–46.0)
Hemoglobin: 12.8 g/dL (ref 12.0–15.0)
MCH: 26.8 pg (ref 26.0–34.0)
MCHC: 33.2 g/dL (ref 30.0–36.0)
MCV: 80.9 fL (ref 78.0–100.0)
RBC: 4.77 MIL/uL (ref 3.87–5.11)

## 2012-06-25 MED ORDER — HYDROCODONE-ACETAMINOPHEN 5-325 MG PO TABS: 1.0000 | ORAL_TABLET | Freq: Four times a day (QID) | ORAL | Status: AC | PRN

## 2012-06-25 MED ORDER — ALBUTEROL SULFATE HFA 108 (90 BASE) MCG/ACT IN AERS: 2.0000 | INHALATION_SPRAY | RESPIRATORY_TRACT | Status: DC | PRN

## 2012-06-25 MED ORDER — DOXYCYCLINE HYCLATE 100 MG PO TABS: 100.0000 mg | ORAL_TABLET | Freq: Two times a day (BID) | ORAL | Status: DC

## 2012-06-25 MED ORDER — BENZONATATE 100 MG PO CAPS: 100.0000 mg | ORAL_CAPSULE | Freq: Three times a day (TID) | ORAL | Status: DC | PRN

## 2012-06-25 NOTE — Progress Notes (Signed)
  Subjective:    Patient ID: Kristin Logan, female    DOB: 1932/05/16, 78 y.o.   MRN: [id number]  HPI Pt here with recurrent cough with production, soreness in ribs after coughing, has been sick x 1 week, initially started with sore throat. Right foot injury, dropped water bottle on foot 1 week ago, seen in ED diagnosed with contusion, Xray negative but difficult to see because of signigicant swelling, continues to have pain with ambulation.   Review of Systems    GEN- denies fatigue, fever, weight loss,weakness, recent illness HEENT- denies eye drainage, change in vision, nasal discharge, CVS- denies chest pain, palpitations RESP- + SOB, +cough, +wheeze ABD- denies N/V, change in stools, abd pain GU- denies dysuria, hematuria, dribbling, incontinence MSK- + joint pain, +muscle aches, injury Neuro- denies headache, dizziness, syncope, seizure activity    Objective:   Physical Exam EN- NAD, obese, in rolling walker, alert and oriented x 3 , HEENT-PEERL, EOMI , oropharynx clear, fundoscopic exam benign CVS- RRR, 2/6 SEM RESP-scattered expiratory wheeze, course rhonchi, some clear with cough, normal WOB , no retractions ABD-NABS,soft,NT,ND Ext- Right foot- swelling over dorsum right foot, significant bruising noted extending onto lateral surface of foot, TTP over area of swelling, good ROM at ankle, +movement of all 5 digits        Assessment & Plan:

## 2012-06-25 NOTE — Patient Instructions (Addendum)
Get the xray done of your foot Boot to be sent  Get labs before next visit F/U 2 weeks for labs, breathing

## 2012-06-26 DIAGNOSIS — J40 Bronchitis, not specified as acute or chronic: Secondary | ICD-10-CM | POA: Insufficient documentation

## 2012-06-26 LAB — COMPREHENSIVE METABOLIC PANEL
ALT: 13 U/L (ref 0–35)
AST: 21 U/L (ref 0–37)
Alkaline Phosphatase: 65 U/L (ref 39–117)
Calcium: 9.4 mg/dL (ref 8.4–10.5)
Chloride: 95 mEq/L — ABNORMAL LOW (ref 96–112)
Creat: 0.6 mg/dL (ref 0.50–1.10)
Total Bilirubin: 0.5 mg/dL (ref 0.3–1.2)

## 2012-06-26 NOTE — Assessment & Plan Note (Signed)
Well controlled 

## 2012-06-26 NOTE — Assessment & Plan Note (Signed)
She still has significant swelling and bruising but her ROM is okay. She is able to bear weight. Will repeat xray since swelling has improved, will place in post-op shoe for support and comfort, return in 2 weeks,  Xray negative

## 2012-06-26 NOTE — Assessment & Plan Note (Signed)
Currently stable.

## 2012-06-26 NOTE — Assessment & Plan Note (Addendum)
Will give doxy as she has had Zpak last 2 infections, albuterol inhaler for wheeze, tessalon perrles

## 2012-07-10 ENCOUNTER — Other Ambulatory Visit: Payer: Self-pay

## 2012-07-10 MED ORDER — AMLODIPINE BESYLATE 5 MG PO TABS: ORAL_TABLET | ORAL | Status: DC

## 2012-07-12 ENCOUNTER — Other Ambulatory Visit: Payer: Self-pay | Admitting: Family Medicine

## 2012-07-16 ENCOUNTER — Encounter: Payer: Self-pay | Admitting: Family Medicine

## 2012-07-16 ENCOUNTER — Ambulatory Visit (INDEPENDENT_AMBULATORY_CARE_PROVIDER_SITE_OTHER): Payer: Medicare Other | Admitting: Family Medicine

## 2012-07-16 VITALS — BP 130/70 | HR 80 | Resp 15 | Ht 65.5 in | Wt 240.0 lb

## 2012-07-16 DIAGNOSIS — I10 Essential (primary) hypertension: Secondary | ICD-10-CM

## 2012-07-16 DIAGNOSIS — E039 Hypothyroidism, unspecified: Secondary | ICD-10-CM

## 2012-07-16 DIAGNOSIS — J40 Bronchitis, not specified as acute or chronic: Secondary | ICD-10-CM

## 2012-07-16 DIAGNOSIS — E119 Type 2 diabetes mellitus without complications: Secondary | ICD-10-CM

## 2012-07-16 NOTE — Patient Instructions (Signed)
Flu shot given  Continue your other medications Labs look good Coumadin check needs to be rescheduled F/U 3 months

## 2012-07-17 ENCOUNTER — Ambulatory Visit (INDEPENDENT_AMBULATORY_CARE_PROVIDER_SITE_OTHER): Payer: Medicare Other | Admitting: *Deleted

## 2012-07-17 DIAGNOSIS — I482 Chronic atrial fibrillation, unspecified: Secondary | ICD-10-CM

## 2012-07-17 DIAGNOSIS — Z7901 Long term (current) use of anticoagulants: Secondary | ICD-10-CM

## 2012-07-17 DIAGNOSIS — I4891 Unspecified atrial fibrillation: Secondary | ICD-10-CM

## 2012-07-17 NOTE — Assessment & Plan Note (Signed)
Now resolved.  

## 2012-07-17 NOTE — Progress Notes (Signed)
  Subjective:    Patient ID: Kristin Logan, female    DOB: Nov 27, 1931, 78 y.o.   MRN: [id number]  HPI Pt here to f/u labs and breathing, treated for bronchitis 2 weeks ago, doing well, no concerns Breathing is much better. No new concerns Pain meds are helping knees, foot pain now resolved    Review of Systems - per above  GEN- denies fatigue, fever, weight loss,weakness, recent illness HEENT- denies eye drainage, change in vision, nasal discharge, CVS- denies chest pain, palpitations RESP- denies SOB, cough, wheeze MSK- +joint pain, muscle aches, injury        Objective:   Physical Exam GEN- NAD, obese, in rolling walker, alert and oriented x 3 , HEENT-PEERL, EOMI , oropharynx clear,  CVS- irregular rhythem, normal rate  2/6 SEM RESP-CTAB ABD-NABS,soft,NT,ND Ext- no swelling, good ROM bilat ankles      Assessment & Plan:

## 2012-07-17 NOTE — Assessment & Plan Note (Signed)
Well controlled 

## 2012-07-17 NOTE — Assessment & Plan Note (Signed)
Well controlled, needs A1C at next lab draw

## 2012-07-17 NOTE — Assessment & Plan Note (Signed)
Labs look good, no change to meds

## 2012-07-26 ENCOUNTER — Other Ambulatory Visit: Payer: Self-pay | Admitting: Family Medicine

## 2012-07-31 ENCOUNTER — Other Ambulatory Visit: Payer: Self-pay | Admitting: Family Medicine

## 2012-08-05 ENCOUNTER — Telehealth: Payer: Self-pay | Admitting: Family Medicine

## 2012-08-05 MED ORDER — BENZONATATE 100 MG PO CAPS: 100.0000 mg | ORAL_CAPSULE | Freq: Three times a day (TID) | ORAL | Status: DC | PRN

## 2012-08-05 NOTE — Telephone Encounter (Signed)
Please advise 

## 2012-08-05 NOTE — Telephone Encounter (Signed)
Tessalon refilled °

## 2012-08-12 ENCOUNTER — Other Ambulatory Visit: Payer: Self-pay | Admitting: Family Medicine

## 2012-08-20 DIAGNOSIS — F329 Major depressive disorder, single episode, unspecified: Secondary | ICD-10-CM

## 2012-08-20 DIAGNOSIS — E119 Type 2 diabetes mellitus without complications: Secondary | ICD-10-CM

## 2012-08-20 DIAGNOSIS — F3289 Other specified depressive episodes: Secondary | ICD-10-CM

## 2012-08-20 DIAGNOSIS — I4891 Unspecified atrial fibrillation: Secondary | ICD-10-CM

## 2012-08-28 ENCOUNTER — Encounter (INDEPENDENT_AMBULATORY_CARE_PROVIDER_SITE_OTHER): Payer: Medicare Other

## 2012-08-28 DIAGNOSIS — I4891 Unspecified atrial fibrillation: Secondary | ICD-10-CM

## 2012-08-28 DIAGNOSIS — Z7901 Long term (current) use of anticoagulants: Secondary | ICD-10-CM

## 2012-09-12 ENCOUNTER — Ambulatory Visit (INDEPENDENT_AMBULATORY_CARE_PROVIDER_SITE_OTHER): Payer: Medicare Other | Admitting: *Deleted

## 2012-09-12 DIAGNOSIS — I482 Chronic atrial fibrillation, unspecified: Secondary | ICD-10-CM

## 2012-09-12 DIAGNOSIS — Z7901 Long term (current) use of anticoagulants: Secondary | ICD-10-CM

## 2012-09-12 DIAGNOSIS — I4891 Unspecified atrial fibrillation: Secondary | ICD-10-CM

## 2012-10-07 ENCOUNTER — Other Ambulatory Visit: Payer: Self-pay | Admitting: Family Medicine

## 2012-10-11 ENCOUNTER — Telehealth: Payer: Self-pay | Admitting: Cardiology

## 2012-10-11 NOTE — Telephone Encounter (Signed)
Patient no showed appointment.  Left message for patient to call office to reschedule.  Letter mailed. / tgs

## 2012-10-15 ENCOUNTER — Ambulatory Visit (INDEPENDENT_AMBULATORY_CARE_PROVIDER_SITE_OTHER): Payer: Medicare Other | Admitting: Family Medicine

## 2012-10-15 ENCOUNTER — Encounter: Payer: Self-pay | Admitting: Family Medicine

## 2012-10-15 ENCOUNTER — Ambulatory Visit (HOSPITAL_COMMUNITY)
Admission: RE | Admit: 2012-10-15 | Discharge: 2012-10-15 | Disposition: A | Discharge status: Home / Self Care | Payer: Medicare Other | Source: Ambulatory Visit | Attending: Family Medicine | Admitting: Family Medicine

## 2012-10-15 VITALS — BP 130/72 | HR 73 | Resp 18 | Ht 65.5 in | Wt 227.1 lb

## 2012-10-15 DIAGNOSIS — Z951 Presence of aortocoronary bypass graft: Secondary | ICD-10-CM | POA: Insufficient documentation

## 2012-10-15 DIAGNOSIS — J40 Bronchitis, not specified as acute or chronic: Secondary | ICD-10-CM

## 2012-10-15 DIAGNOSIS — Z7901 Long term (current) use of anticoagulants: Secondary | ICD-10-CM

## 2012-10-15 DIAGNOSIS — E119 Type 2 diabetes mellitus without complications: Secondary | ICD-10-CM

## 2012-10-15 DIAGNOSIS — I10 Essential (primary) hypertension: Secondary | ICD-10-CM

## 2012-10-15 DIAGNOSIS — E785 Hyperlipidemia, unspecified: Secondary | ICD-10-CM

## 2012-10-15 DIAGNOSIS — E669 Obesity, unspecified: Secondary | ICD-10-CM

## 2012-10-15 DIAGNOSIS — R062 Wheezing: Secondary | ICD-10-CM

## 2012-10-15 DIAGNOSIS — G4730 Sleep apnea, unspecified: Secondary | ICD-10-CM

## 2012-10-15 DIAGNOSIS — E039 Hypothyroidism, unspecified: Secondary | ICD-10-CM

## 2012-10-15 DIAGNOSIS — R05 Cough: Secondary | ICD-10-CM

## 2012-10-15 DIAGNOSIS — R059 Cough, unspecified: Secondary | ICD-10-CM

## 2012-10-15 MED ORDER — BENZONATATE 100 MG PO CAPS: 100.0000 mg | ORAL_CAPSULE | Freq: Three times a day (TID) | ORAL | Status: DC | PRN

## 2012-10-15 MED ORDER — ALBUTEROL SULFATE HFA 108 (90 BASE) MCG/ACT IN AERS: 2.0000 | INHALATION_SPRAY | RESPIRATORY_TRACT | Status: DC | PRN

## 2012-10-15 MED ORDER — LEVOFLOXACIN 750 MG PO TABS: 750.0000 mg | ORAL_TABLET | Freq: Every day | ORAL | Status: AC

## 2012-10-15 NOTE — Progress Notes (Signed)
  Subjective:    Patient ID: Kristin Logan, female    DOB: 1932/01/15, 79 y.o.   MRN: [id number]  HPI  Patient here to follow chronic medical problems. She's had a cough for the past 3 weeks she does not feel like she completely got over the last bout of bronchitis. She's also been wheezing worse at nighttime. She ran out of her inhaler. Denies any recent fever. She did have a bout of the flu she states about a month ago where she had fatigue nausea diarrhea abdominal pain and a dry cough.  She needs help getting her sleep apnea machine set back up since she moved. She missed her Coumadin clinic appointment today appear Diabetes mellitus she does not take her blood sugars on a regular basis she is taking Amaryl as prescribed.  Review of Systems   GEN- denies fatigue, fever, weight loss,weakness, recent illness HEENT- denies eye drainage, change in vision, nasal discharge, CVS- denies chest pain, palpitations RESP- denies SOB, cough, wheeze ABD- denies N/V, change in stools, abd pain GU- denies dysuria, hematuria, dribbling, incontinence MSK- denies joint pain, muscle aches, injury Neuro- denies headache, dizziness, syncope, seizure activity      Objective:   Physical Exam GEN- NAD, obese, in rolling walker, alert and oriented x 3 , HEENT-PEERL, EOMI , oropharynx clear,  CVS- irregular rhythem, normal rate  2/6 SEM RESP-No wheeze, + rhonchi right base, normal WOB  ABD-NABS,soft,NT,ND Ext- no swelling, good ROM bilat ankles Diabetic Foot exam- decreased monofilament, no open lesions, thickened nails       Assessment & Plan:

## 2012-10-15 NOTE — Patient Instructions (Addendum)
Get the labs done today Get Chest xray  Levaquin antibiotic once a day for 10 days Tessalon for cough  ALbuterol PFT lung test to be done  Continue current medications  Get Coumadin appointment for next week  Call me with the information about your CPAP Machine F/U 3 months

## 2012-10-16 LAB — CBC
HCT: 38.9 % (ref 36.0–46.0)
Hemoglobin: 13.3 g/dL (ref 12.0–15.0)
MCH: 28.7 pg (ref 26.0–34.0)
MCHC: 34.2 g/dL (ref 30.0–36.0)
RBC: 4.63 MIL/uL (ref 3.87–5.11)

## 2012-10-16 LAB — LIPID PANEL
HDL: 34 mg/dL — ABNORMAL LOW (ref >39)
LDL Cholesterol: 50 mg/dL (ref 0–99)
Total CHOL/HDL Ratio: 3.6 Ratio
Triglycerides: 189 mg/dL — ABNORMAL HIGH (ref <150)

## 2012-10-16 LAB — T4: T4, Total: 7 ug/dL (ref 5.0–12.5)

## 2012-10-16 LAB — BASIC METABOLIC PANEL
CO2: 25 mEq/L (ref 19–32)
Chloride: 95 mEq/L — ABNORMAL LOW (ref 96–112)
Potassium: 4.2 mEq/L (ref 3.5–5.3)
Sodium: 132 mEq/L — ABNORMAL LOW (ref 135–145)

## 2012-10-16 LAB — T3, FREE: T3, Free: 1.8 pg/mL — ABNORMAL LOW (ref 2.3–4.2)

## 2012-10-16 LAB — HEMOGLOBIN A1C: Hgb A1c MFr Bld: 5.8 % — ABNORMAL HIGH (ref <5.7)

## 2012-10-16 NOTE — Assessment & Plan Note (Signed)
Congratulated on intentional weight loss

## 2012-10-16 NOTE — Assessment & Plan Note (Signed)
She will call me with provider for her CPAP we need to get this back working she benefits greatly from its use

## 2012-10-16 NOTE — Assessment & Plan Note (Signed)
Check FLP on statin drug 

## 2012-10-16 NOTE — Assessment & Plan Note (Signed)
Well controlled 

## 2012-10-16 NOTE — Assessment & Plan Note (Signed)
Missed coumadin appt, I will obtain INR for this month

## 2012-10-16 NOTE — Assessment & Plan Note (Signed)
Check A1C today, continue amaryl

## 2012-10-16 NOTE — Assessment & Plan Note (Signed)
Recurrent bronchitis, treat with antibiotics, cxr done today which shows no infiltrate, will obtain a PFT on  her

## 2012-10-18 ENCOUNTER — Telehealth: Payer: Self-pay | Admitting: Family Medicine

## 2012-10-21 ENCOUNTER — Telehealth: Payer: Self-pay | Admitting: Family Medicine

## 2012-10-21 MED ORDER — HYDROCODONE-ACETAMINOPHEN 5-325 MG PO TABS: 1.0000 | ORAL_TABLET | Freq: Four times a day (QID) | ORAL | Status: DC | PRN

## 2012-10-21 NOTE — Telephone Encounter (Signed)
Called and left message for patient to return call.  

## 2012-10-21 NOTE — Telephone Encounter (Signed)
Also-- The name of the Dr that did her CPAP/sleep study was [facility] [phone] Dr Welton Flakes

## 2012-10-21 NOTE — Telephone Encounter (Signed)
No INR seen, pt daughter Stanton Kidney will call and get this set up again with coumadin clinic Reviewed labs with daughter, no change to meds Watch diet as triglycerides have increased  She will call back in AM with the name of the supplier for the CPAP

## 2012-10-21 NOTE — Telephone Encounter (Signed)
Wanted results of xray and labs. No comment has been made on them and they were abn.

## 2012-10-28 ENCOUNTER — Ambulatory Visit (INDEPENDENT_AMBULATORY_CARE_PROVIDER_SITE_OTHER): Payer: Medicare Other | Admitting: *Deleted

## 2012-10-28 DIAGNOSIS — Z7901 Long term (current) use of anticoagulants: Secondary | ICD-10-CM

## 2012-10-28 DIAGNOSIS — I482 Chronic atrial fibrillation, unspecified: Secondary | ICD-10-CM

## 2012-10-28 DIAGNOSIS — I4891 Unspecified atrial fibrillation: Secondary | ICD-10-CM

## 2012-10-28 LAB — POCT INR: INR: 2.2

## 2012-10-30 ENCOUNTER — Other Ambulatory Visit: Payer: Self-pay

## 2012-10-30 ENCOUNTER — Telehealth: Payer: Self-pay | Admitting: Family Medicine

## 2012-10-30 MED ORDER — BENZONATATE 100 MG PO CAPS: 100.0000 mg | ORAL_CAPSULE | Freq: Three times a day (TID) | ORAL | Status: DC | PRN

## 2012-10-30 NOTE — Telephone Encounter (Signed)
Is it ok to refill tessalon perles?  

## 2012-10-30 NOTE — Telephone Encounter (Signed)
Med refilled.

## 2012-10-30 NOTE — Telephone Encounter (Signed)
Okay to refill? 

## 2012-11-05 ENCOUNTER — Ambulatory Visit (HOSPITAL_COMMUNITY): Admission: RE | Admit: 2012-11-05 | Payer: Medicare Other | Source: Ambulatory Visit

## 2012-11-06 ENCOUNTER — Telehealth: Payer: Self-pay | Admitting: Family Medicine

## 2012-11-08 MED ORDER — PREDNISONE 10 MG PO TABS: ORAL_TABLET | ORAL | Status: DC

## 2012-11-08 MED ORDER — ALBUTEROL SULFATE (2.5 MG/3ML) 0.083% IN NEBU: 2.5000 mg | INHALATION_SOLUTION | Freq: Four times a day (QID) | RESPIRATORY_TRACT | Status: DC | PRN

## 2012-11-08 NOTE — Telephone Encounter (Signed)
I can send neb machine but to [place], [place] not [place] as she does not have a diagnosis of COPD or Asthma,  We can try She needs to start the prednisone 10mg , use mucinex and continue the inhaler Please ask if they rescheduled her lung study that was to be done at [facility] this is very important , so I can figure out why she keeps getting bronchitis

## 2012-11-08 NOTE — Telephone Encounter (Signed)
Please reschedule the appointment for PFT, pt was confused

## 2012-11-08 NOTE — Telephone Encounter (Signed)
Daughter aware of orders and understands that nebulizer may not be covered by insurance. Daughter states that they may have accidentally cancelled appointment thinking that it was something that they had already done.  She is asking that order be put in again.

## 2012-11-08 NOTE — Telephone Encounter (Signed)
Daughter states that patient is wheezing after use of inhaler and is asking about the possibility of neb tx.  Would like to get opinon of doctor.  Patient also has chest congestion and cough.

## 2012-11-10 ENCOUNTER — Encounter (HOSPITAL_COMMUNITY): Payer: Self-pay

## 2012-11-10 ENCOUNTER — Inpatient Hospital Stay (HOSPITAL_COMMUNITY)
Admission: EM | Admit: 2012-11-10 | Discharge: 2012-11-12 | DRG: 645 | Disposition: A | Discharge status: Home Health | Payer: Medicare Other | Attending: Internal Medicine | Admitting: Internal Medicine

## 2012-11-10 ENCOUNTER — Emergency Department (HOSPITAL_COMMUNITY): Payer: Medicare Other

## 2012-11-10 DIAGNOSIS — I10 Essential (primary) hypertension: Secondary | ICD-10-CM | POA: Diagnosis present

## 2012-11-10 DIAGNOSIS — D721 Eosinophilia, unspecified: Secondary | ICD-10-CM | POA: Diagnosis present

## 2012-11-10 DIAGNOSIS — E669 Obesity, unspecified: Secondary | ICD-10-CM | POA: Diagnosis present

## 2012-11-10 DIAGNOSIS — H9190 Unspecified hearing loss, unspecified ear: Secondary | ICD-10-CM | POA: Diagnosis present

## 2012-11-10 DIAGNOSIS — G4730 Sleep apnea, unspecified: Secondary | ICD-10-CM | POA: Diagnosis present

## 2012-11-10 DIAGNOSIS — E119 Type 2 diabetes mellitus without complications: Secondary | ICD-10-CM

## 2012-11-10 DIAGNOSIS — R2681 Unsteadiness on feet: Secondary | ICD-10-CM | POA: Diagnosis present

## 2012-11-10 DIAGNOSIS — E039 Hypothyroidism, unspecified: Secondary | ICD-10-CM | POA: Diagnosis present

## 2012-11-10 DIAGNOSIS — F329 Major depressive disorder, single episode, unspecified: Secondary | ICD-10-CM | POA: Diagnosis present

## 2012-11-10 DIAGNOSIS — R0902 Hypoxemia: Secondary | ICD-10-CM | POA: Diagnosis present

## 2012-11-10 DIAGNOSIS — F3289 Other specified depressive episodes: Secondary | ICD-10-CM | POA: Diagnosis present

## 2012-11-10 DIAGNOSIS — F411 Generalized anxiety disorder: Secondary | ICD-10-CM | POA: Diagnosis present

## 2012-11-10 DIAGNOSIS — R55 Syncope and collapse: Secondary | ICD-10-CM

## 2012-11-10 DIAGNOSIS — Z853 Personal history of malignant neoplasm of breast: Secondary | ICD-10-CM

## 2012-11-10 DIAGNOSIS — IMO0002 Reserved for concepts with insufficient information to code with codable children: Secondary | ICD-10-CM | POA: Diagnosis present

## 2012-11-10 DIAGNOSIS — Z9010 Acquired absence of unspecified breast and nipple: Secondary | ICD-10-CM

## 2012-11-10 DIAGNOSIS — Z79899 Other long term (current) drug therapy: Secondary | ICD-10-CM

## 2012-11-10 DIAGNOSIS — I4891 Unspecified atrial fibrillation: Secondary | ICD-10-CM | POA: Diagnosis present

## 2012-11-10 DIAGNOSIS — E236 Other disorders of pituitary gland: Principal | ICD-10-CM | POA: Diagnosis present

## 2012-11-10 DIAGNOSIS — J40 Bronchitis, not specified as acute or chronic: Secondary | ICD-10-CM

## 2012-11-10 DIAGNOSIS — I482 Chronic atrial fibrillation, unspecified: Secondary | ICD-10-CM | POA: Diagnosis present

## 2012-11-10 DIAGNOSIS — E871 Hypo-osmolality and hyponatremia: Secondary | ICD-10-CM

## 2012-11-10 DIAGNOSIS — R269 Unspecified abnormalities of gait and mobility: Secondary | ICD-10-CM | POA: Diagnosis present

## 2012-11-10 DIAGNOSIS — Z923 Personal history of irradiation: Secondary | ICD-10-CM

## 2012-11-10 DIAGNOSIS — Z7901 Long term (current) use of anticoagulants: Secondary | ICD-10-CM

## 2012-11-10 DIAGNOSIS — J9801 Acute bronchospasm: Secondary | ICD-10-CM | POA: Diagnosis present

## 2012-11-10 DIAGNOSIS — I359 Nonrheumatic aortic valve disorder, unspecified: Secondary | ICD-10-CM | POA: Diagnosis present

## 2012-11-10 DIAGNOSIS — K219 Gastro-esophageal reflux disease without esophagitis: Secondary | ICD-10-CM | POA: Diagnosis present

## 2012-11-10 DIAGNOSIS — M1710 Unilateral primary osteoarthritis, unspecified knee: Secondary | ICD-10-CM | POA: Diagnosis present

## 2012-11-10 DIAGNOSIS — Z6837 Body mass index (BMI) 37.0-37.9, adult: Secondary | ICD-10-CM

## 2012-11-10 LAB — BASIC METABOLIC PANEL
BUN: 7 mg/dL (ref 6–23)
Calcium: 8.9 mg/dL (ref 8.4–10.5)
Creatinine, Ser: 0.48 mg/dL — ABNORMAL LOW (ref 0.50–1.10)
GFR calc Af Amer: >90 mL/min (ref >90)
GFR calc non Af Amer: >90 mL/min (ref >90)

## 2012-11-10 LAB — CBC WITH DIFFERENTIAL/PLATELET
Basophils Absolute: 0 10*3/uL (ref 0.0–0.1)
Basophils Relative: 0 % (ref 0–1)
Eosinophils Absolute: 1.5 10*3/uL — ABNORMAL HIGH (ref 0.0–0.7)
HCT: 35 % — ABNORMAL LOW (ref 36.0–46.0)
Hemoglobin: 12.6 g/dL (ref 12.0–15.0)
MCH: 29 pg (ref 26.0–34.0)
MCHC: 36 g/dL (ref 30.0–36.0)
Monocytes Absolute: 0.9 10*3/uL (ref 0.1–1.0)
Monocytes Relative: 10 % (ref 3–12)
Neutro Abs: 4.6 10*3/uL (ref 1.7–7.7)
RDW: 13.5 % (ref 11.5–15.5)

## 2012-11-10 MED ORDER — HYDROMORPHONE HCL PF 1 MG/ML IJ SOLN
0.5000 mg | Freq: Once | INTRAMUSCULAR | Status: AC
  Administered 2012-11-10: 0.5 mg via INTRAVENOUS
  Filled 2012-11-10: qty 1

## 2012-11-10 MED ORDER — SODIUM CHLORIDE 0.9 % IV BOLUS (SEPSIS)
1000.0000 mL | Freq: Once | INTRAVENOUS | Status: AC
  Administered 2012-11-10: 1000 mL via INTRAVENOUS

## 2012-11-10 NOTE — ED Notes (Signed)
Ems called out for pt that fell tonight, states she slipped while trying to sit down on chair, fell onto left shoulder.   Pt also reports that she has been vomiting.

## 2012-11-10 NOTE — ED Provider Notes (Signed)
History  This chart was scribed for Kristin Lennert, MD by Erskine Emery, ED Scribe. This patient was seen in room APA16A/APA16A and the patient's care was started at 19:56.   CSN: [id number]  Arrival date & time 11/10/12  1951   First MD Initiated Contact with Patient 11/10/12 1956      Chief Complaint  Patient presents with  . Fall  . Shoulder Pain  . Emesis    (Consider location/radiation/quality/duration/timing/severity/associated sxs/prior Treatment) SCARLETTROSE Logan is a 79 y.o. female brought in by ambulance, who presents to the Emergency Department complaining of sudden onset left jaw pain and left shoulder pain after slipping and falling onto a table tonight, while trying to sit down in her chair. Pt reports some associated emesis and some bronchitis for the last month. Pt has been on 3 rounds of antibiotics in the last 6 months but has been on no steroids. Pt is on blood thinners. Patient is a 79 y.o. female presenting with fall. The history is provided by the patient, the EMS personnel and a relative. No language interpreter was used.  Fall The accident occurred 1 to 2 hours ago. Fall occurred: while trying to sit. She fell from a height of 3 to 5 ft. Impact surface: a table. There was no blood loss. The point of impact was the left shoulder (jaw). The pain is present in the left shoulder. The pain is mild. There was no entrapment after the fall. There was no drug use involved in the accident. There was no alcohol use involved in the accident. Associated symptoms include vomiting. Pertinent negatives include no fever, no abdominal pain and no hematuria. The symptoms are aggravated by activity. She has tried nothing for the symptoms. The treatment provided no relief.    Past Medical History  Diagnosis Date  . Diabetes mellitus   . Hyperlipidemia   . Eosinophilia   . Cataract   . Hypertension   . Atrial fibrillation prior to 2008    moderate biatrial enlargement in 2009; mild  to moderate LVH with a low-normal EF  . GERD (gastroesophageal reflux disease)     Hemorrhoids; history of peptic ulcer disease  . DJD (degenerative joint disease) of knee   . Sleep apnea     CPAP  . Chronic anticoagulation   . Aortic valve disease     2009: mild stenosis and regurgitation; peak gradient of 30-35 mmHg , subsequent AVR at [facility]  . Incontinence     Mild  . Carcinoma of breast     Right mastectomy; radiation and hormonal therapy    Past Surgical History  Procedure Laterality Date  . Mastectomy partial / lumpectomy w/ axillary lymphadenectomy  2008    Right for carcinoma;  . Exploration post operative open heart    . Colonoscopy      Approximately 2000  . Cardiac valve replacement      [facility]    Family History  Problem Relation Age of Onset  . Heart disease Mother   . Stroke Father   . Cancer Sister   . Heart disease Sister   . Stroke Sister   . Stroke Brother     History  Substance Use Topics  . Smoking status: Never Smoker   . Smokeless tobacco: Not on file  . Alcohol Use: No    OB History   Grav Para Term Preterm Abortions TAB SAB Ect Mult Living  Review of Systems  Constitutional: Negative for fever and fatigue.  HENT: Negative for congestion, sinus pressure and ear discharge.        Left jaw pain  Eyes: Negative for discharge.  Respiratory: Negative for cough.   Gastrointestinal: Positive for vomiting. Negative for abdominal pain.  Genitourinary: Negative for frequency and hematuria.  Musculoskeletal: Negative for back pain.       Left shoulder pain  Skin: Negative for rash.  Neurological: Negative for seizures.  Psychiatric/Behavioral: Negative for hallucinations.  All other systems reviewed and are negative.    Allergies  Nsaids and Codeine  Home Medications   Current Outpatient Rx  Name  Route  Sig  Dispense  Refill  . ACCU-CHEK FASTCLIX LANCETS MISC   Does not apply   1 each by Does not apply route daily.  Use as directed with Accuchek meter once daily   102 each   6   . Acetaminophen (TYLENOL ARTHRITIS PAIN PO)      PRN          . albuterol (PROVENTIL HFA;VENTOLIN HFA) 108 (90 BASE) MCG/ACT inhaler   Inhalation   Inhale 2 puffs into the lungs every 4 (four) hours as needed for wheezing.   1 Inhaler   2   . albuterol (PROVENTIL) (2.5 MG/3ML) 0.083% nebulizer solution   Nebulization   Take 3 mLs (2.5 mg total) by nebulization every 6 (six) hours as needed for wheezing.   75 mL   3   . AMARYL 1 MG tablet      TAKE 1 TABLET DAILY BEFORE BREAKFAST.   30 tablet   3   . amLODipine (NORVASC) 5 MG tablet      TAKE ONE TABLET DAILY FOR BLOOD PRESSURE.   30 tablet   6   . benzonatate (TESSALON PERLES) 100 MG capsule   Oral   Take 1 capsule (100 mg total) by mouth 3 (three) times daily as needed for cough.   20 capsule   0   . calcium carbonate (OS-CAL) 600 MG TABS   Oral   Take 600 mg by mouth daily.         . CELEXA 40 MG tablet      TAKE ONE TABLET DAILY AROUND 5-7PM FOR MOOD.   30 tablet   2   . furosemide (LASIX) 40 MG tablet   Oral   Take 1 tablet (40 mg total) by mouth daily. For fluid   30 tablet   6   . glucose blood (ACCU-CHEK AVIVA) test strip      Use as instructed x once daily  Dx 250.00   50 each   6   . HYDROcodone-acetaminophen (NORCO) 5-325 MG per tablet   Oral   Take 1 tablet by mouth every 6 (six) hours as needed for pain.   45 tablet   1   . Krill Oil 500 MG CAPS   Oral   Take 1 capsule by mouth daily.         Marland Kitchen lisinopril (PRINIVIL,ZESTRIL) 40 MG tablet      TAKE ONE TABLET DAILY FOR BLOOD PRESSURE.   30 tablet   3   . LORazepam (ATIVAN) 0.5 MG tablet   Oral   Take 1 tablet (0.5 mg total) by mouth 2 (two) times daily.   60 tablet   1   . LOTRISONE cream      APPLY TO THE AFEFCTED AREAS TWICE DAILY.   15 g   3   .  metoprolol succinate (TOPROL-XL) 25 MG 24 hr tablet      TAKE ONE TABLET BY MOUTH DAILY FOR BLOOD  PRESSURE.   30 tablet   3   . Multiple Vitamin (MULTIVITAMIN) tablet   Oral   Take 1 tablet by mouth daily.         Marland Kitchen omeprazole (PRILOSEC) 20 MG capsule   Oral   Take 1 capsule (20 mg total) by mouth 2 (two) times daily. For stomach acid   60 capsule   6   . polyethylene glycol powder (MIRALAX) powder      1 cap full in clear liquid daily, hold for loose stools   850 g   6   . potassium chloride SA (K-DUR,KLOR-CON) 20 MEQ tablet   Oral   Take 1 tablet (20 mEq total) by mouth daily. For potassium and fluid pill   30 tablet   6   . predniSONE (DELTASONE) 10 MG tablet      1 tab BID x 5 days   10 tablet   0   . simvastatin (ZOCOR) 20 MG tablet   Oral   Take 1 tablet (20 mg total) by mouth at bedtime.   30 tablet   6   . SYNTHROID 50 MCG tablet      TAKE 1 TABLET BY MOUTH ONCE A DAY FOR THYROID.TAKE 30 MINUTES BEFORE OTHER MORNING MEDICATIONS.   30 tablet   3   . VOLTAREN 1 % GEL      USE 1 APPLICATION TOPICALLY AS NEEDED FOR KNEE AND JOINT PAIN   100 g   3   . warfarin (COUMADIN) 5 MG tablet   Oral   Take 1 tablet (5 mg total) by mouth as directed.   50 tablet   3     Triage Vitals: BP 150/76  Pulse 66  Temp(Src) 96.8 F (36 C) (Oral)  Resp 18  Ht 5\' 6"  (1.676 m)  Wt 220 lb (99.791 kg)  BMI 35.53 kg/m2  SpO2 95%  Physical Exam  Nursing note and vitals reviewed. Constitutional: She is oriented to person, place, and time. She appears well-developed.  HENT:  Head: Normocephalic and atraumatic.  Eyes: Conjunctivae and EOM are normal. No scleral icterus.  Neck: Neck supple. No thyromegaly present.  Cardiovascular: Normal rate and regular rhythm.  Exam reveals no gallop and no friction rub.   No murmur heard. Pulmonary/Chest: No stridor. She has wheezes. She has no rales. She exhibits no tenderness.  Minimal wheezing.  Abdominal: She exhibits no distension. There is no tenderness. There is no rebound.  Musculoskeletal: Normal range of motion.  She exhibits tenderness. She exhibits no edema.  Minor tenderness to left shoulder.  Lymphadenopathy:    She has no cervical adenopathy.  Neurological: She is oriented to person, place, and time. Coordination normal.  Skin: No rash noted. No erythema.  Psychiatric: She has a normal mood and affect. Her behavior is normal.    ED Course  Procedures (including critical care time) DIAGNOSTIC STUDIES: Oxygen Saturation is 95% on room air, adequate by my interpretation.    COORDINATION OF CARE: 20:15--I evaluated the patient and we discussed a treatment plan including shoulder and chest x-rays and blood work to which the pt agreed.   21:13--I notified the pt that her blood tests show her salt levels are so low she needs to be admitted. She reports she has been nauseous and vomiting since this morning, before the fall.   Results for orders  placed during the hospital encounter of 11/10/12  CBC WITH DIFFERENTIAL      Result Value Range   WBC 9.3  4.0 - 10.5 K/uL   RBC 4.35  3.87 - 5.11 MIL/uL   Hemoglobin 12.6  12.0 - 15.0 g/dL   HCT 16.1 (*) 09.6 - 04.5 %   MCV 80.5  78.0 - 100.0 fL   MCH 29.0  26.0 - 34.0 pg   MCHC 36.0  30.0 - 36.0 g/dL   RDW 40.9  81.1 - 91.4 %   Platelets 335  150 - 400 K/uL   Neutrophils Relative 49  43 - 77 %   Neutro Abs 4.6  1.7 - 7.7 K/uL   Lymphocytes Relative 25  12 - 46 %   Lymphs Abs 2.3  0.7 - 4.0 K/uL   Monocytes Relative 10  3 - 12 %   Monocytes Absolute 0.9  0.1 - 1.0 K/uL   Eosinophils Relative 16 (*) 0 - 5 %   Eosinophils Absolute 1.5 (*) 0.0 - 0.7 K/uL   Basophils Relative 0  0 - 1 %   Basophils Absolute 0.0  0.0 - 0.1 K/uL  BASIC METABOLIC PANEL      Result Value Range   Sodium 117 (*) 135 - 145 mEq/L   Potassium 3.9  3.5 - 5.1 mEq/L   Chloride 82 (*) 96 - 112 mEq/L   CO2 23  19 - 32 mEq/L   Glucose, Bld 108 (*) 70 - 99 mg/dL   BUN 7  6 - 23 mg/dL   Creatinine, Ser 7.82 (*) 0.50 - 1.10 mg/dL   Calcium 8.9  8.4 - 95.6 mg/dL   GFR calc  non Af Amer >90  >90 mL/min   GFR calc Af Amer >90  >90 mL/min      No diagnosis found.    MDM        The chart was scribed for me under my direct supervision.  I personally performed the history, physical, and medical decision making and all procedures in the evaluation of this patient.Kristin Lennert, MD 11/13/12 [phone]

## 2012-11-10 NOTE — ED Notes (Signed)
CRITICAL VALUE ALERT  Critical value received:  NA 117  Date of notification:  11/10/12  Time of notification:  2100  Critical value read back:yes  Nurse who received alert:  Eustace Quail RN  MD notified (1st page):  Dr. Estell Harpin  Time of first page:    MD notified (2nd page):  Time of second page:  Responding MD:    Time MD responded:

## 2012-11-11 ENCOUNTER — Encounter (HOSPITAL_COMMUNITY): Payer: Self-pay | Admitting: Urology

## 2012-11-11 ENCOUNTER — Encounter (HOSPITAL_COMMUNITY): Payer: Medicare Other

## 2012-11-11 ENCOUNTER — Inpatient Hospital Stay (HOSPITAL_COMMUNITY): Payer: Medicare Other

## 2012-11-11 DIAGNOSIS — Z7901 Long term (current) use of anticoagulants: Secondary | ICD-10-CM

## 2012-11-11 DIAGNOSIS — E871 Hypo-osmolality and hyponatremia: Secondary | ICD-10-CM | POA: Diagnosis present

## 2012-11-11 DIAGNOSIS — J9801 Acute bronchospasm: Secondary | ICD-10-CM

## 2012-11-11 DIAGNOSIS — R55 Syncope and collapse: Secondary | ICD-10-CM

## 2012-11-11 DIAGNOSIS — E119 Type 2 diabetes mellitus without complications: Secondary | ICD-10-CM

## 2012-11-11 DIAGNOSIS — I4891 Unspecified atrial fibrillation: Secondary | ICD-10-CM

## 2012-11-11 DIAGNOSIS — E039 Hypothyroidism, unspecified: Secondary | ICD-10-CM

## 2012-11-11 HISTORY — DX: Syncope and collapse: R55

## 2012-11-11 LAB — BASIC METABOLIC PANEL
BUN: 6 mg/dL (ref 6–23)
BUN: 6 mg/dL (ref 6–23)
BUN: 5 mg/dL — ABNORMAL LOW (ref 6–23)
BUN: 5 mg/dL — ABNORMAL LOW (ref 6–23)
BUN: 4 mg/dL — ABNORMAL LOW (ref 6–23)
BUN: 4 mg/dL — ABNORMAL LOW (ref 6–23)
CO2: 24 mEq/L (ref 19–32)
CO2: 24 mEq/L (ref 19–32)
CO2: 24 mEq/L (ref 19–32)
Calcium: 8.4 mg/dL (ref 8.4–10.5)
Calcium: 8.4 mg/dL (ref 8.4–10.5)
Calcium: 8.5 mg/dL (ref 8.4–10.5)
Calcium: 8.4 mg/dL (ref 8.4–10.5)
Calcium: 8.1 mg/dL — ABNORMAL LOW (ref 8.4–10.5)
Chloride: 87 mEq/L — ABNORMAL LOW (ref 96–112)
Chloride: 85 mEq/L — ABNORMAL LOW (ref 96–112)
Creatinine, Ser: 0.44 mg/dL — ABNORMAL LOW (ref 0.50–1.10)
Creatinine, Ser: 0.49 mg/dL — ABNORMAL LOW (ref 0.50–1.10)
Creatinine, Ser: 0.49 mg/dL — ABNORMAL LOW (ref 0.50–1.10)
Creatinine, Ser: 0.45 mg/dL — ABNORMAL LOW (ref 0.50–1.10)
Creatinine, Ser: 0.51 mg/dL (ref 0.50–1.10)
GFR calc Af Amer: >90 mL/min (ref >90)
GFR calc Af Amer: >90 mL/min (ref >90)
GFR calc non Af Amer: >90 mL/min (ref >90)
GFR calc non Af Amer: >90 mL/min (ref >90)
GFR calc non Af Amer: >90 mL/min (ref >90)
Glucose, Bld: 134 mg/dL — ABNORMAL HIGH (ref 70–99)
Glucose, Bld: 118 mg/dL — ABNORMAL HIGH (ref 70–99)
Glucose, Bld: 138 mg/dL — ABNORMAL HIGH (ref 70–99)
Glucose, Bld: 88 mg/dL (ref 70–99)
Glucose, Bld: 101 mg/dL — ABNORMAL HIGH (ref 70–99)
Glucose, Bld: 120 mg/dL — ABNORMAL HIGH (ref 70–99)
Potassium: 3.9 mEq/L (ref 3.5–5.1)
Sodium: 122 mEq/L — ABNORMAL LOW (ref 135–145)

## 2012-11-11 LAB — CBC
HCT: 31 % — ABNORMAL LOW (ref 36.0–46.0)
Hemoglobin: 11 g/dL — ABNORMAL LOW (ref 12.0–15.0)
MCH: 28.9 pg (ref 26.0–34.0)
MCHC: 35.5 g/dL (ref 30.0–36.0)
MCV: 81.4 fL (ref 78.0–100.0)
RDW: 13.6 % (ref 11.5–15.5)

## 2012-11-11 LAB — GLUCOSE, CAPILLARY
Glucose-Capillary: 111 mg/dL — ABNORMAL HIGH (ref 70–99)
Glucose-Capillary: 97 mg/dL (ref 70–99)
Glucose-Capillary: 82 mg/dL (ref 70–99)

## 2012-11-11 LAB — URINE MICROSCOPIC-ADD ON

## 2012-11-11 LAB — MAGNESIUM: Magnesium: 2 mg/dL (ref 1.5–2.5)

## 2012-11-11 LAB — HEPATIC FUNCTION PANEL
Albumin: 3.7 g/dL (ref 3.5–5.2)
Alkaline Phosphatase: 65 U/L (ref 39–117)
Bilirubin, Direct: 0.1 mg/dL (ref 0.0–0.3)
Indirect Bilirubin: 0.3 mg/dL (ref 0.3–0.9)
Total Bilirubin: 0.4 mg/dL (ref 0.3–1.2)

## 2012-11-11 LAB — SODIUM, URINE, RANDOM: Sodium, Ur: 38 mEq/L

## 2012-11-11 LAB — URINALYSIS, ROUTINE W REFLEX MICROSCOPIC
Bilirubin Urine: NEGATIVE
Ketones, ur: 15 mg/dL — AB
Urobilinogen, UA: 0.2 mg/dL (ref 0.0–1.0)

## 2012-11-11 LAB — APTT: aPTT: 58 seconds — ABNORMAL HIGH (ref 24–37)

## 2012-11-11 MED ORDER — ALBUTEROL SULFATE (5 MG/ML) 0.5% IN NEBU: 2.5000 mg | INHALATION_SOLUTION | RESPIRATORY_TRACT | Status: DC | PRN

## 2012-11-11 MED ORDER — ALBUTEROL SULFATE HFA 108 (90 BASE) MCG/ACT IN AERS: 2.0000 | INHALATION_SPRAY | RESPIRATORY_TRACT | Status: DC | PRN

## 2012-11-11 MED ORDER — LORATADINE 10 MG PO TABS
10.0000 mg | ORAL_TABLET | Freq: Every day | ORAL | Status: DC
  Administered 2012-11-11 – 2012-11-12 (×2): 10 mg via ORAL
  Filled 2012-11-11 (×2): qty 1

## 2012-11-11 MED ORDER — LORAZEPAM 0.5 MG PO TABS: 0.5000 mg | ORAL_TABLET | Freq: Two times a day (BID) | ORAL | Status: DC | PRN

## 2012-11-11 MED ORDER — ACETAMINOPHEN 325 MG PO TABS
650.0000 mg | ORAL_TABLET | Freq: Four times a day (QID) | ORAL | Status: DC | PRN
  Administered 2012-11-11: 650 mg via ORAL
  Filled 2012-11-11: qty 2

## 2012-11-11 MED ORDER — TRAZODONE HCL 50 MG PO TABS: 50.0000 mg | ORAL_TABLET | Freq: Every evening | ORAL | Status: DC | PRN

## 2012-11-11 MED ORDER — GUAIFENESIN ER 600 MG PO TB12
1200.0000 mg | ORAL_TABLET | Freq: Two times a day (BID) | ORAL | Status: DC
  Administered 2012-11-11 – 2012-11-12 (×4): 1200 mg via ORAL
  Filled 2012-11-11 (×4): qty 2
  Filled 2012-11-11: qty 1

## 2012-11-11 MED ORDER — DICLOFENAC SODIUM 1 % TD GEL
2.0000 g | Freq: Every day | TRANSDERMAL | Status: DC | PRN
  Administered 2012-11-11: 2 g via TOPICAL
  Filled 2012-11-11: qty 100

## 2012-11-11 MED ORDER — ONDANSETRON HCL 4 MG/2ML IJ SOLN
INTRAMUSCULAR | Status: AC
  Filled 2012-11-11: qty 2

## 2012-11-11 MED ORDER — LORATADINE 10 MG PO TABS: 10.0000 mg | ORAL_TABLET | Freq: Every day | ORAL | Status: DC

## 2012-11-11 MED ORDER — CITALOPRAM HYDROBROMIDE 20 MG PO TABS
40.0000 mg | ORAL_TABLET | Freq: Every day | ORAL | Status: DC
  Administered 2012-11-11 – 2012-11-12 (×2): 40 mg via ORAL
  Filled 2012-11-11 (×2): qty 2

## 2012-11-11 MED ORDER — ONDANSETRON HCL 4 MG/2ML IJ SOLN: 4.0000 mg | Freq: Three times a day (TID) | INTRAMUSCULAR | Status: DC | PRN

## 2012-11-11 MED ORDER — INSULIN ASPART 100 UNIT/ML ~~LOC~~ SOLN: 0.0000 [IU] | Freq: Every day | SUBCUTANEOUS | Status: DC

## 2012-11-11 MED ORDER — ONDANSETRON HCL 4 MG/2ML IJ SOLN
4.0000 mg | INTRAMUSCULAR | Status: DC | PRN
  Administered 2012-11-11: 4 mg via INTRAVENOUS

## 2012-11-11 MED ORDER — LISINOPRIL 10 MG PO TABS
40.0000 mg | ORAL_TABLET | Freq: Every day | ORAL | Status: DC
  Administered 2012-11-11 – 2012-11-12 (×2): 40 mg via ORAL
  Filled 2012-11-11 (×2): qty 4

## 2012-11-11 MED ORDER — METOPROLOL SUCCINATE ER 25 MG PO TB24
25.0000 mg | ORAL_TABLET | Freq: Every day | ORAL | Status: DC
  Administered 2012-11-11 – 2012-11-12 (×2): 25 mg via ORAL
  Filled 2012-11-11 (×2): qty 1

## 2012-11-11 MED ORDER — IPRATROPIUM BROMIDE 0.02 % IN SOLN
0.5000 mg | RESPIRATORY_TRACT | Status: DC
  Administered 2012-11-11 – 2012-11-12 (×8): 0.5 mg via RESPIRATORY_TRACT
  Filled 2012-11-11 (×9): qty 2.5

## 2012-11-11 MED ORDER — WARFARIN - PHYSICIAN DOSING INPATIENT: Freq: Every day | Status: DC

## 2012-11-11 MED ORDER — WARFARIN - PHARMACIST DOSING INPATIENT
Freq: Every day | Status: DC
  Administered 2012-11-12: 18:00:00

## 2012-11-11 MED ORDER — FUROSEMIDE 10 MG/ML IJ SOLN
20.0000 mg | Freq: Two times a day (BID) | INTRAMUSCULAR | Status: DC
  Administered 2012-11-11 – 2012-11-12 (×2): 20 mg via INTRAVENOUS
  Filled 2012-11-11 (×2): qty 2

## 2012-11-11 MED ORDER — GLIMEPIRIDE 2 MG PO TABS
1.0000 mg | ORAL_TABLET | Freq: Every day | ORAL | Status: DC
  Administered 2012-11-11 – 2012-11-12 (×2): 1 mg via ORAL
  Filled 2012-11-11 (×2): qty 1

## 2012-11-11 MED ORDER — LEVOTHYROXINE SODIUM 50 MCG PO TABS
50.0000 ug | ORAL_TABLET | Freq: Every day | ORAL | Status: DC
  Administered 2012-11-11 – 2012-11-12 (×2): 50 ug via ORAL
  Filled 2012-11-11 (×2): qty 1

## 2012-11-11 MED ORDER — HYDROCODONE-ACETAMINOPHEN 5-325 MG PO TABS: 1.0000 | ORAL_TABLET | Freq: Four times a day (QID) | ORAL | Status: DC | PRN

## 2012-11-11 MED ORDER — ACETAMINOPHEN 650 MG RE SUPP: 650.0000 mg | Freq: Four times a day (QID) | RECTAL | Status: DC | PRN

## 2012-11-11 MED ORDER — WARFARIN SODIUM 5 MG PO TABS
5.0000 mg | ORAL_TABLET | Freq: Once | ORAL | Status: AC
  Administered 2012-11-11: 5 mg via ORAL
  Filled 2012-11-11: qty 1

## 2012-11-11 MED ORDER — SIMVASTATIN 20 MG PO TABS
20.0000 mg | ORAL_TABLET | Freq: Every day | ORAL | Status: DC
  Administered 2012-11-11: 20 mg via ORAL
  Filled 2012-11-11: qty 1

## 2012-11-11 MED ORDER — ALBUTEROL SULFATE (5 MG/ML) 0.5% IN NEBU
2.5000 mg | INHALATION_SOLUTION | RESPIRATORY_TRACT | Status: DC
  Administered 2012-11-11 – 2012-11-12 (×8): 2.5 mg via RESPIRATORY_TRACT
  Filled 2012-11-11 (×9): qty 0.5

## 2012-11-11 MED ORDER — PANTOPRAZOLE SODIUM 40 MG PO TBEC
40.0000 mg | DELAYED_RELEASE_TABLET | Freq: Two times a day (BID) | ORAL | Status: DC
  Administered 2012-11-11 – 2012-11-12 (×4): 40 mg via ORAL
  Filled 2012-11-11 (×4): qty 1

## 2012-11-11 MED ORDER — AMLODIPINE BESYLATE 5 MG PO TABS
5.0000 mg | ORAL_TABLET | Freq: Every day | ORAL | Status: DC
Start: 2012-11-11 — End: 2012-11-12
  Administered 2012-11-11 – 2012-11-12 (×2): 5 mg via ORAL
  Filled 2012-11-11 (×2): qty 1

## 2012-11-11 MED ORDER — INSULIN ASPART 100 UNIT/ML ~~LOC~~ SOLN: 0.0000 [IU] | Freq: Three times a day (TID) | SUBCUTANEOUS | Status: DC

## 2012-11-11 MED ORDER — SODIUM CHLORIDE 0.9 % IV SOLN
INTRAVENOUS | Status: DC
  Administered 2012-11-11 (×3): via INTRAVENOUS

## 2012-11-11 NOTE — Progress Notes (Signed)
CRITICAL VALUE ALERT  Critical value received: Serum Sodium  Date of notification: 02/0/2014   Time of notification: 1620  Critical value read back: yes  Nurse who received alert: Deno Etienne, RN  MD notified (1st page): Dr. Kerry Hough  Time of first page: 1625  MD notified (2nd page):  Time of second page:  Responding MD:    Time MD responded:

## 2012-11-11 NOTE — Progress Notes (Signed)
ANTICOAGULATION CONSULT NOTE - Initial Consult  Pharmacy Consult for Coumadin Indication: atrial fibrillation  Allergies  Allergen Reactions  . Nsaids     Bleeding ulcer  . Codeine     Patient Measurements: Height: 5\' 6"  (167.6 cm) Weight: 240 lb (108.863 kg) IBW/kg (Calculated) : 59.3  Vital Signs: Temp: 97.6 F (36.4 C) (02/10 0645) Temp src: Oral (02/10 0645) BP: 120/81 mmHg (02/10 0656) Pulse Rate: 89 (02/10 0656)  Labs:  Recent Labs  11/10/12 2021 11/11/12 0108  11/11/12 0438 11/11/12 0735 11/11/12 0856  HGB 12.6  --   --  11.0*  --   --   HCT 35.0*  --   --  31.0*  --   --   PLT 335  --   --  315  --   --   APTT  --  58*  --   --   --   --   LABPROT  --  23.4*  --   --   --   --   INR  --  2.19*  --   --   --   --   CREATININE 0.48* 0.44*  < > 0.49* 0.49* 0.45*  < > = values in this interval not displayed.  Estimated Creatinine Clearance: 72.4 ml/min (by C-G formula based on Cr of 0.45).   Medical History: Past Medical History  Diagnosis Date  . Diabetes mellitus   . Hyperlipidemia   . Eosinophilia   . Cataract   . Hypertension   . Atrial fibrillation prior to 2008    moderate biatrial enlargement in 2009; mild to moderate LVH with a low-normal EF  . GERD (gastroesophageal reflux disease)     Hemorrhoids; history of peptic ulcer disease  . DJD (degenerative joint disease) of knee   . Sleep apnea     CPAP  . Chronic anticoagulation   . Aortic valve disease     2009: mild stenosis and regurgitation; peak gradient of 30-35 mmHg , subsequent AVR at [facility]  . Incontinence     Mild  . Carcinoma of breast     Right mastectomy; radiation and hormonal therapy    Medications:  Scheduled:  . albuterol  2.5 mg Nebulization Q4H WA  . amLODipine  5 mg Oral Daily  . citalopram  40 mg Oral Daily  . glimepiride  1 mg Oral QAC breakfast  . guaiFENesin  1,200 mg Oral BID  . [COMPLETED]  HYDROmorphone (DILAUDID) injection  0.5 mg Intravenous Once  .  insulin aspart  0-5 Units Subcutaneous QHS  . insulin aspart  0-9 Units Subcutaneous TID WC  . ipratropium  0.5 mg Nebulization Q4H WA  . levothyroxine  50 mcg Oral QAC breakfast  . lisinopril  40 mg Oral Daily  . loratadine  10 mg Oral Daily  . metoprolol succinate  25 mg Oral Daily  . [COMPLETED] ondansetron      . pantoprazole  40 mg Oral BID AC  . simvastatin  20 mg Oral QHS  . [COMPLETED] sodium chloride  1,000 mL Intravenous Once  . warfarin  5 mg Oral ONCE-1800  . [START ON 11/12/2012] Warfarin - Pharmacist Dosing Inpatient   Does not apply q1800  . [DISCONTINUED] loratadine  10 mg Oral Daily  . [DISCONTINUED] Warfarin - Physician Dosing Inpatient   Does not apply q1800    Assessment: 79 yo F on chronic warfarin 5mg  daily for Afib. INR therapeutic on admission. No bleeding noted.  Currently on Diflucan which  can increase INR.   Goal of Therapy:  INR 2-3   Plan:  Coumadin 5mg  po x1 today Daily INR  Rosaly Labarbera, [facility] 11/11/2012,11:29 AM

## 2012-11-11 NOTE — Care Management Note (Unsigned)
    Page 1 of 1   11/11/2012     4:34:52 PM   CARE MANAGEMENT NOTE 11/11/2012  Patient:  Kristin Logan, Kristin Logan   Account Number:  [id number]  Date Initiated:  11/11/2012  Documentation initiated by:  Rosemary Holms  Subjective/Objective Assessment:   Pt admitted from home where she lives with her daughter Stanton Kidney. Currently has a Manufacturing systems engineer. Agreeable to [facility] and HH PT. Will follow     Action/Plan:   Anticipated DC Date:  11/13/2012   Anticipated DC Plan:  HOME W HOME HEALTH SERVICES      DC Planning Services  CM consult      Choice offered to / List presented to:          [facility] arranged  HH-1 RN  HH-2 PT  HH-10 DISEASE MANAGEMENT      HH agency  [facility]   Status of service:  In process, will continue to follow Medicare Important Message given?   (If response is "NO", the following Medicare IM given date fields will be blank) Date Medicare IM given:   Date Additional Medicare IM given:    Discharge Disposition:    Per UR Regulation:    If discussed at Long Length of Stay Meetings, dates discussed:    Comments:  11/11/12 Rosemary Holms RN BSN CM

## 2012-11-11 NOTE — Evaluation (Signed)
Physical Therapy Evaluation Patient Details Name: Kristin Logan MRN: [id number] DOB: 18-Dec-1931 Today's Date: 11/11/2012 Time: 4098-1191 PT Time Calculation (min): 47 min  PT Assessment / Plan / Recommendation Clinical Impression  Pt was found to be moderately deconditioned on eval, but is not far from baseline. She has severe DJD of knees which causes difficulty with gait, but gait is stable with a walker.  She has a CAP aide 5 days/week and pt appears to be managing fairly well at home.  Will recommend HHPT to try to improve overall physical conditioniong.    PT Assessment  Patient needs continued PT services    Follow Up Recommendations  Home health PT    Does the patient have the potential to tolerate intense rehabilitation      Barriers to Discharge None      Equipment Recommendations  None recommended by PT    Recommendations for Other Services     Frequency Min 3X/week    Precautions / Restrictions Precautions Precautions: Fall Restrictions Weight Bearing Restrictions: No   Pertinent Vitals/Pain       Mobility  Bed Mobility Bed Mobility: Sit to Supine;Supine to Sit Supine to Sit: 5: Supervision;HOB elevated Sit to Supine: 5: Supervision;HOB flat Transfers Transfers: Sit to Stand;Stand to Sit Sit to Stand: 5: Supervision;With upper extremity assist;From bed Stand to Sit: 6: Modified independent (Device/Increase time) Ambulation/Gait Ambulation/Gait Assistance: 5: Supervision Ambulation Distance (Feet): 40 Feet Assistive device: Rolling walker Gait Pattern: Within Functional Limits Stairs: No Wheelchair Mobility Wheelchair Mobility: No    Exercises     PT Diagnosis: Difficulty walking;Generalized weakness  PT Problem List: Decreased strength;Decreased activity tolerance;Decreased mobility;Obesity PT Treatment Interventions: Gait training;Functional mobility training;Therapeutic activities;Therapeutic exercise   PT Goals Acute Rehab PT Goals PT  Goal Formulation: With patient/family Time For Goal Achievement: 11/25/12 Potential to Achieve Goals: Good Pt will Ambulate: 51 - 150 feet;with supervision PT Goal: Ambulate - Progress: Goal set today  Visit Information  Last PT Received On: 11/11/12    Subjective Data  Subjective: I've gotten very weak Patient Stated Goal: return home   Prior Functioning  Home Living Lives With: Daughter Available Help at Discharge: Family;Available 24 hours/day Type of Home: Mobile home Home Access: Ramped entrance Home Layout: One level Bathroom Shower/Tub: Engineer, manufacturing systems: Standard Bathroom Accessibility: Yes How Accessible: Accessible via walker Home Adaptive Equipment: Straight cane;Walker - four wheeled Prior Function Level of Independence: Independent with assistive device(s) Able to Take Stairs?: No Driving: No Vocation: Retired Musician: No difficulties    Copywriter, advertising Overall Cognitive Status: Appears within functional limits for tasks assessed/performed Arousal/Alertness: Awake/alert Orientation Level: Appears intact for tasks assessed Behavior During Session: [facility] for tasks performed    Extremity/Trunk Assessment Right Lower Extremity Assessment RLE ROM/Strength/Tone: Deficits RLE ROM/Strength/Tone Deficits: strength of quad=3-/5, hip = 3/5 RLE Sensation: WFL - Light Touch RLE Coordination: WFL - gross motor Left Lower Extremity Assessment LLE ROM/Strength/Tone: WFL for tasks assessed LLE Sensation: WFL - Light Touch LLE Coordination: WFL - gross motor   Balance Balance Balance Assessed: No  End of Session PT - End of Session Equipment Utilized During Treatment: Gait belt Activity Tolerance: Patient tolerated treatment well Patient left: in bed  GP     Myrlene Broker L 11/11/2012, 9:37 AM

## 2012-11-11 NOTE — Progress Notes (Addendum)
Patient admitted earlier this morning by Dr. Orvan Falconer  Patient seen and examined, database reveiwed  She has been admitted with generalized weakness and hyponatremia.  She reports drinking roughly 3L of water a day.  She is also on oral lasix.  Denies any beer intake. She has been started on saline infusion and labs are improving. Will continue current treatment and follow serial sodium levels.  Physical therapy is evaluating.  Addendum:  Repeat sodium is trending down. Serum osmolarity was low. Urine osmolarity is elevated. 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 We will discontinue IV fluids. Trial of Lasix. Restrict water intake. followup sodium on next blood draw. If her sodium fails to improve, may benefit from a nephrology consult.

## 2012-11-11 NOTE — Progress Notes (Signed)
Pt asleep at 0400 and had been sick,nausea, vomiting earlier, no neb or cpap started

## 2012-11-11 NOTE — Progress Notes (Signed)
OT Cancellation Note  Patient Details Name: Kristin Logan MRN: [id number] DOB: 07/14/1932   Cancelled Treatment:    Reason Eval/Treat Not Completed: Fatigue/lethargy limiting ability to participate (Patient sleeping at this time.) Will re-attempt OT eval when appropriate.  Limmie Patricia, OTR/L  11/11/2012, 1:23 PM

## 2012-11-11 NOTE — Progress Notes (Signed)
CRITICAL VALUE ALERT  Critical value received:  Serum osmolality  Date of notification:  11/11/12  Time of notification:  1455  Critical value read back:yes  Nurse who received alert:  A. Aundria Rud, RN  MD notified (1st page):  Dr Kerry Hough  Time of first page:  1458  MD notified (2nd page):  Time of second page:  Responding MD:    Time MD responded:

## 2012-11-11 NOTE — Progress Notes (Signed)
CRITICAL VALUE ALERT  Critical value received:  Sodium 118  Date of notification: 11/11/2012    Time of notification:  0212  Critical value read back:yes  Nurse who received alert:  Alfonse Flavors  MD notified (1st page):  Dr. Orvan Falconer   Time of first page:  0218  MD notified (2nd page):  Time of second page:  Responding MD:    Time MD responded:

## 2012-11-11 NOTE — H&P (Signed)
[name] History and Physical  Kristin Logan  ZHY:[id number]  DOB: 1931/12/24   DOA: 11/11/2012   PCP:   [name], MD   Chief Complaint:  Syncopal episode this evening  HPI: Kristin Logan is an 79 y.o. female.   Elderly Caucasian lady who lives with her daughter, Kristin Logan, who is assisting with the history. She has multiple medical problems including gait instability related to arthritis of the knees, diabetes hypertension, chronic atrophic fibrillation, chronic eosinophilia; has been having wheezing and shortness of breath for the past few months much worse for the past 2 weeks, has also been having increasing cough for the past 2, lightheadedness, dizziness, nausea and headaches. Wheezing and shortness of breath have not really been helped by hand-held bronchodilators the past 2 weeks, and yesterday she was prescribed a nebulizer but has not yet filled her prescription. This evening returning from an outing just before she got into a house she became dizzy and collapsed. Did not lose consciousness but on arrival to the emergency room evaluation found her to the markedly hyponatremic.  Since a syncopal episode patient has vomited 3 times -previously ingested food no blood or bile.  She has been taking Lasix long term for peripheral edema, and her usage has not increased  She has been feeling very thirsty recently and has been drinking excessive amounts of water; however while in the emergency room on on the ward she's been having great difficulty passing urine, and has only passed a scant amount, despite having received a liter of normal saline.  No fever or acute upper respiratory symptoms. She has obstructive sleep apnea but her CPAP machine needs to repairs Ambulates with the assistance of a rolling walker   Rewiew of Systems:   All systems negative except as marked bold or noted in the HPI;  Constitutional:    malaise, fever and chills. ;  Eyes:   eye pain, redness  and discharge. ;  ENMT:   ear pain, hoarseness, nasal congestion, sinus pressure and sore throat. ;  Cardiovascular:    chest pain, palpitations, diaphoresis, dyspnea and peripheral edema.  Respiratory:   cough, hemoptysis, wheezing and stridor. ;  Gastrointestinal:  nausea, vomiting, diarrhea, constipation, abdominal pain, melena, blood in stool, hematemesis, jaundice and rectal bleeding. unusual weight loss..   Genitourinary:    frequency, dysuria, incontinence,flank pain and hematuria; Musculoskeletal:   back pain and neck pain.  swelling and trauma.; Chronic knee pains Skin: .  pruritus, rash, abrasions, bruising and skin lesion.; ulcerations Neuro:    headache, lightheadedness and neck stiffness.  weakness, altered level of consciousness, altered mental status, extremity weakness, burning feet, involuntary movement, seizure and syncope.  Psych:    anxiety, depression, insomnia, tearfulness, panic attacks, hallucinations, paranoia, suicidal or homicidal ideation    Past Medical History  Diagnosis Date  . Diabetes mellitus   . Hyperlipidemia   . Eosinophilia   . Cataract   . Hypertension   . Atrial fibrillation prior to 2008    moderate biatrial enlargement in 2009; mild to moderate LVH with a low-normal EF  . GERD (gastroesophageal reflux disease)     Hemorrhoids; history of peptic ulcer disease  . DJD (degenerative joint disease) of knee   . Sleep apnea     CPAP  . Chronic anticoagulation   . Aortic valve disease     2009: mild stenosis and regurgitation; peak gradient of 30-35 mmHg , subsequent AVR at [facility]  . Incontinence     Mild  .  Carcinoma of breast     Right mastectomy; radiation and hormonal therapy    Past Surgical History  Procedure Laterality Date  . Mastectomy partial / lumpectomy w/ axillary lymphadenectomy  2008    Right for carcinoma;  . Exploration post operative open heart    . Colonoscopy      Approximately 2000  . Cardiac valve replacement      [facility]     Medications:  HOME MEDS: Prior to Admission medications   Medication Sig Start Date End Date Taking? Authorizing Provider  ACCU-CHEK FASTCLIX LANCETS MISC 1 each by Does not apply route daily. Use as directed with Accuchek meter once daily 07/21/11  Yes Kerri Perches, MD  albuterol (PROVENTIL) (2.5 MG/3ML) 0.083% nebulizer solution Take 3 mLs (2.5 mg total) by nebulization every 6 (six) hours as needed for wheezing. 11/08/12  Yes Salley Scarlet, MD  amLODipine (NORVASC) 5 MG tablet Take 5 mg by mouth daily.   Yes Historical Provider, MD  calcium carbonate (OS-CAL) 600 MG TABS Take 600 mg by mouth daily.   Yes Historical Provider, MD  citalopram (CELEXA) 40 MG tablet Take 40 mg by mouth daily. Patient take around 5pm-7pm   Yes Historical Provider, MD  clotrimazole-betamethasone (LOTRISONE) cream Apply 1 application topically 2 (two) times daily.   Yes Historical Provider, MD  diclofenac sodium (VOLTAREN) 1 % GEL Apply 2 g topically daily as needed (for knee and joint pain).   Yes Historical Provider, MD  furosemide (LASIX) 40 MG tablet Take 1 tablet (40 mg total) by mouth daily. For fluid 05/21/12  Yes Salley Scarlet, MD  glimepiride (AMARYL) 1 MG tablet Take 1 mg by mouth daily before breakfast.   Yes Historical Provider, MD  glucose blood (ACCU-CHEK AVIVA) test strip Use as instructed x once daily  Dx 250.00 07/21/11  Yes Kerri Perches, MD  HYDROcodone-acetaminophen (NORCO) 5-325 MG per tablet Take 1 tablet by mouth every 6 (six) hours as needed for pain. 10/21/12 10/21/13 Yes Salley Scarlet, MD  Krill Oil 500 MG CAPS Take 1 capsule by mouth daily.   Yes Historical Provider, MD  levothyroxine (SYNTHROID, LEVOTHROID) 50 MCG tablet Take 50 mcg by mouth daily.   Yes Historical Provider, MD  lisinopril (PRINIVIL,ZESTRIL) 40 MG tablet Take 40 mg by mouth daily.   Yes Historical Provider, MD  LORazepam (ATIVAN) 0.5 MG tablet Take 1 tablet (0.5 mg total) by mouth 2 (two) times daily.  12/04/11 12/03/12 Yes Salley Scarlet, MD  metoprolol succinate (TOPROL-XL) 25 MG 24 hr tablet Take 25 mg by mouth daily.   Yes Historical Provider, MD  Multiple Vitamin (MULTIVITAMIN) tablet Take 1 tablet by mouth daily.   Yes Historical Provider, MD  omeprazole (PRILOSEC) 20 MG capsule Take 1 capsule (20 mg total) by mouth 2 (two) times daily. For stomach acid 05/21/12  Yes Salley Scarlet, MD  polyethylene glycol powder [facility]) powder 1 cap full in clear liquid daily, hold for loose stools 02/05/12  Yes Salley Scarlet, MD  potassium chloride SA (K-DUR,KLOR-CON) 20 MEQ tablet Take 1 tablet (20 mEq total) by mouth daily. For potassium and fluid pill 05/21/12  Yes Salley Scarlet, MD  simvastatin (ZOCOR) 20 MG tablet Take 1 tablet (20 mg total) by mouth at bedtime. 10/27/11  Yes Salley Scarlet, MD  warfarin (COUMADIN) 5 MG tablet Take 1 tablet (5 mg total) by mouth as directed. 05/30/12  Yes Kathlen Brunswick, MD  albuterol (PROVENTIL HFA;VENTOLIN HFA) 108 (  90 BASE) MCG/ACT inhaler Inhale 2 puffs into the lungs every 4 (four) hours as needed for wheezing. 10/15/12 10/15/13  Salley Scarlet, MD     Allergies:  Allergies  Allergen Reactions  . Nsaids     Bleeding ulcer  . Codeine     Social History:   reports that she has never smoked. She does not have any smokeless tobacco history on file. She reports that she does not drink alcohol or use illicit drugs.  Family History: Family History  Problem Relation Age of Onset  . Heart disease Mother   . Stroke Father   . Cancer Sister   . Heart disease Sister   . Stroke Sister   . Stroke Brother      Physical Exam: Filed Vitals:   11/10/12 1953  BP: 150/76  Pulse: 66  Temp: 96.8 F (36 C)  TempSrc: Oral  Resp: 18  Height: 5\' 6"  (1.676 m)  Weight: 99.791 kg (220 lb)  SpO2: 95%   Blood pressure 150/76, pulse 66, temperature 96.8 F (36 C), temperature source Oral, resp. rate 18, height 5\' 6"  (1.676 m), weight 99.791 kg (220 lb),  SpO2 95.00%.  GEN:  Pleasant elderly Caucasian lady reclining in the bed in no acute distress; cooperative with exam PSYCH:  alert and oriented x4; appears a little anxious; affect is appropriate. HEENT: Mucous membranes pink, dry and anicteric; PERRLA; EOM intact; no cervical lymphadenopathy nor thyromegaly or carotid bruit;  Breasts:: Not examined CHEST WALL: No tenderness CHEST: Prolonged expiration and diffuse bilateral wheezing HEART: Irregular rhythm no murmurs rubs or gallops BACK: Mild kyphosis  no CVA tenderness ABDOMEN: Obese, soft non-tender; no masses, no organomegaly, normal abdominal bowel sounds;  Rectal Exam: Not done EXTREMITIES:  age-appropriate arthropathy of the hands and knees; no edema; no ulcerations. Genitalia: not examined PULSES: 2+ and symmetric SKIN: Normal hydration no rash or ulceration CNS: Cranial nerves 2-12 grossly intact no focal lateralizing neurologic deficit   Labs on Admission:  Basic Metabolic Panel:  Recent Labs Lab 11/10/12 2021  NA 117*  K 3.9  CL 82*  CO2 23  GLUCOSE 108*  BUN 7  CREATININE 0.48*  CALCIUM 8.9   Liver Function Tests: No results found for this basename: AST, ALT, ALKPHOS, BILITOT, PROT, ALBUMIN,  in the last 168 hours No results found for this basename: LIPASE, AMYLASE,  in the last 168 hours No results found for this basename: AMMONIA,  in the last 168 hours CBC:  Recent Labs Lab 11/10/12 2021  WBC 9.3  NEUTROABS 4.6  HGB 12.6  HCT 35.0*  MCV 80.5  PLT 335   Cardiac Enzymes: No results found for this basename: CKTOTAL, CKMB, CKMBINDEX, TROPONINI,  in the last 168 hours BNP: No components found with this basename: POCBNP,  D-dimer: No components found with this basename: D-DIMER,  CBG: No results found for this basename: GLUCAP,  in the last 168 hours  Radiological Exams on Admission: Dg Chest 2 View  11/10/2012  *RADIOLOGY REPORT*  Clinical Data: Headache.  Fall.  CHEST - 2 VIEW  Comparison:  10/15/2012  Findings: Moderate to prominent cardiomegaly noted.  Sternotomy wires are present. The patient is rotated to the right on today's exam, resulting in reduced diagnostic sensitivity and specificity. Tortuous thoracic aorta noted.  The lungs appear clear.  Right axillary clips are present.  IMPRESSION:  1.  Cardiomegaly, without overt edema. 2.  Tortuous and atherosclerotic thoracic aorta.   Original Report Authenticated By: Gaylyn Rong, M.D.  Dg Shoulder Left  11/10/2012  *RADIOLOGY REPORT*  Clinical Data: Fall.  Bruising of the upper arm.  LEFT SHOULDER - 2+ VIEW  Comparison: 10/15/2012  Findings: Technical factors related to patient body habitus reduce diagnostic sensitivity and specificity.  Degenerative AC joint arthropathy noted without malalignment.  No shoulder dislocation is observed.  No proximal humeral fracture noted.  IMPRESSION:  1.  Degenerative AC joint arthropathy.   Otherwise, no significant abnormality identified.   Original Report Authenticated By: Gaylyn Rong, M.D.      Assessment/Plan Present on Admission:  Syncope and clinical dehydration . symptomatic Hyponatremia,  . Atrial fibrillation, chronic . Degenerative joint disease of knee . Depression . DIABETES MELLITUS, TYPE II . Difficulty hearing . Gait instability . HYPERTENSION . Hypothyroidism . Obesity . Sleep apnea . Bronchospasm, and eosinophilia    PLAN: A complicated lady who seems to have a multiple active pathologies; she seems dehydrated, and hypo-osmolar; however with a history of excess water intake cannot rule out water intoxication. Will check her serum and urine osmolality, and urine sodium; We'll repeat her current serum sodium, and because of access issues, we will continue with normal saline hydration, slow increase her serum sodium by no more 6 mmol in 24 hours. She'll probably benefit from a PICC line.  Will give serial nebulizations for her bronchospasm, but withhold  steroids for the time being until we have a better understanding of her hyponatremia. Her eosinophilia suggests that she may benefit from steroids. Consider a pulmonary consult.  We'll add Claritin an H2 blocker   pharmacy to continue management of her warfarin  Continue home antidiabetic medications and add sensitive sliding scale  Check thyroid function  Provide CPAP while in hospital  Oral Diflucan for her history of vaginal Candida  Other plans as per orders.  Code Status: FULL CODE  Family Communication:  Dr. Irven Coe telephone [phone] present for interview examination and discussion of plans  Disposition Plan:  depending on response to therapy    Sidhant Helderman Nocturnist [name] Pager [phone]   11/11/2012, 12:57 AM

## 2012-11-12 DIAGNOSIS — J40 Bronchitis, not specified as acute or chronic: Secondary | ICD-10-CM

## 2012-11-12 LAB — PROTIME-INR
INR: 2.03 — ABNORMAL HIGH (ref 0.00–1.49)
Prothrombin Time: 22.1 seconds — ABNORMAL HIGH (ref 11.6–15.2)

## 2012-11-12 LAB — GLUCOSE, CAPILLARY
Glucose-Capillary: 56 mg/dL — ABNORMAL LOW (ref 70–99)
Glucose-Capillary: 82 mg/dL (ref 70–99)

## 2012-11-12 LAB — BASIC METABOLIC PANEL
CO2: 26 mEq/L (ref 19–32)
Calcium: 8.5 mg/dL (ref 8.4–10.5)
Calcium: 8.9 mg/dL (ref 8.4–10.5)
GFR calc non Af Amer: 89 mL/min — ABNORMAL LOW (ref >90)
Glucose, Bld: 95 mg/dL (ref 70–99)
Glucose, Bld: 96 mg/dL (ref 70–99)
Sodium: 123 mEq/L — ABNORMAL LOW (ref 135–145)
Sodium: 130 mEq/L — ABNORMAL LOW (ref 135–145)

## 2012-11-12 MED ORDER — TIOTROPIUM BROMIDE MONOHYDRATE 18 MCG IN CAPS: 18.0000 ug | ORAL_CAPSULE | Freq: Every day | RESPIRATORY_TRACT | Status: DC

## 2012-11-12 MED ORDER — IPRATROPIUM BROMIDE 0.02 % IN SOLN: 0.5000 mg | RESPIRATORY_TRACT | Status: DC

## 2012-11-12 MED ORDER — FLUCONAZOLE 100 MG PO TABS
200.0000 mg | ORAL_TABLET | Freq: Every day | ORAL | Status: DC
  Administered 2012-11-12: 200 mg via ORAL
  Filled 2012-11-12: qty 2

## 2012-11-12 MED ORDER — WARFARIN SODIUM 5 MG PO TABS
5.0000 mg | ORAL_TABLET | Freq: Once | ORAL | Status: AC
  Administered 2012-11-12: 5 mg via ORAL
  Filled 2012-11-12: qty 1

## 2012-11-12 MED ORDER — SODIUM CHLORIDE 0.9 % IJ SOLN: 10.0000 mL | INTRAMUSCULAR | Status: DC | PRN

## 2012-11-12 MED ORDER — CITALOPRAM HYDROBROMIDE 40 MG PO TABS: 20.0000 mg | ORAL_TABLET | Freq: Every day | ORAL | Status: DC

## 2012-11-12 MED ORDER — SODIUM CHLORIDE 0.9 % IJ SOLN: 10.0000 mL | Freq: Two times a day (BID) | INTRAMUSCULAR | Status: DC

## 2012-11-12 NOTE — Progress Notes (Signed)
ANTICOAGULATION CONSULT NOTE   Pharmacy Consult for Coumadin Indication: atrial fibrillation  Allergies  Allergen Reactions  . Nsaids     Bleeding ulcer  . Codeine    Patient Measurements: Height: 5\' 6"  (167.6 cm) Weight: 234 lb 12.6 oz (106.5 kg) IBW/kg (Calculated) : 59.3  Vital Signs: Temp: 98.3 F (36.8 C) (02/11 0632) Temp src: Oral (02/11 0632) BP: 142/74 mmHg (02/11 0632) Pulse Rate: 83 (02/11 0632)  Labs:  Recent Labs  11/10/12 2021 11/11/12 0108  11/11/12 0438  11/11/12 2051 11/12/12 0245 11/12/12 0247 11/12/12 0852  HGB 12.6  --   --  11.0*  --   --   --   --   --   HCT 35.0*  --   --  31.0*  --   --   --   --   --   PLT 335  --   --  315  --   --   --   --   --   APTT  --  58*  --   --   --   --   --   --   --   LABPROT  --  23.4*  --   --   --   --   --  22.1*  --   INR  --  2.19*  --   --   --   --   --  2.03*  --   CREATININE 0.48* 0.44*  < > 0.49*  < > 0.51 0.52  --  0.53  < > = values in this interval not displayed.  Estimated Creatinine Clearance: 71.5 ml/min (by C-G formula based on Cr of 0.53).   Medical History: Past Medical History  Diagnosis Date  . Diabetes mellitus   . Hyperlipidemia   . Eosinophilia   . Cataract   . Hypertension   . Atrial fibrillation prior to 2008    moderate biatrial enlargement in 2009; mild to moderate LVH with a low-normal EF  . GERD (gastroesophageal reflux disease)     Hemorrhoids; history of peptic ulcer disease  . DJD (degenerative joint disease) of knee   . Sleep apnea     CPAP  . Chronic anticoagulation   . Aortic valve disease     2009: mild stenosis and regurgitation; peak gradient of 30-35 mmHg , subsequent AVR at [facility]  . Incontinence     Mild  . Carcinoma of breast     Right mastectomy; radiation and hormonal therapy    Medications:  Scheduled:  . albuterol  2.5 mg Nebulization Q4H WA  . amLODipine  5 mg Oral Daily  . citalopram  40 mg Oral Daily  . fluconazole  200 mg Oral Daily  .  furosemide  20 mg Intravenous BID  . glimepiride  1 mg Oral QAC breakfast  . guaiFENesin  1,200 mg Oral BID  . ipratropium  0.5 mg Nebulization Q4H WA  . levothyroxine  50 mcg Oral QAC breakfast  . lisinopril  40 mg Oral Daily  . loratadine  10 mg Oral Daily  . metoprolol succinate  25 mg Oral Daily  . pantoprazole  40 mg Oral BID AC  . simvastatin  20 mg Oral QHS  . sodium chloride  10-40 mL Intracatheter Q12H  . [COMPLETED] warfarin  5 mg Oral ONCE-1800  . Warfarin - Pharmacist Dosing Inpatient   Does not apply q1800  . [DISCONTINUED] insulin aspart  0-5 Units Subcutaneous QHS  . [DISCONTINUED] insulin  aspart  0-9 Units Subcutaneous TID WC    Assessment: 79 yo F on chronic warfarin 5mg  daily for Afib. INR therapeutic on admission. No bleeding noted.  Currently on Diflucan which can increase INR.   Goal of Therapy:  INR 2-3   Plan:  Coumadin 5mg  po x1 today Daily INR  Valrie Hart A 11/12/2012,12:45 PM

## 2012-11-12 NOTE — Progress Notes (Signed)
UR Chart Review Completed  

## 2012-11-12 NOTE — Discharge Summary (Signed)
Kristin Espy, MD Physician Signed Internal Medicine Discharge Planning Service date: 11/12/2012 1:49 PM               Physician Discharge Summary    Kristin Logan:[id number] DOB: August 29, 1932 DOA: 11/10/2012   PCP: [name], MD   Admit date: 11/10/2012 Discharge date: 11/12/2012   Time spent: 30 minutes   Recommendations for Outpatient Follow-up:   Patient will follow up with her primary care physician in the next few weeks. Her physician can determine whether the decrease in Celexa is improving her hyponatremia and give her a trial of decreasing her fluid restriction   Discharge Diagnoses:   Active Problems:   DIABETES MELLITUS, TYPE II   HYPERTENSION   Atrial fibrillation, chronic   Degenerative joint disease of knee   BREAST CANCER, HX OF   Chronic anticoagulation   Sleep apnea   Obesity   Depression   Hypothyroidism   Gait instability   Difficulty hearing   Hyponatremia   Bronchospasm   Syncope and collapse   Discharge Condition: Improved, being discharged home   Diet recommendation: Heart healthy with a 1500 cc fluid restriction    Filed Weights     11/11/12 0645  11/12/12 0500  11/12/12 0629   Weight:  108.863 kg (240 lb)  106.5 kg (234 lb 12.6 oz)  106.5 kg (234 lb 12.6 oz)      History of present illness:   Kristin Logan is an 79 y.o. female. Elderly Caucasian lady who  has multiple medical problems including gait instability related to arthritis of the knees, diabetes hypertension, chronic atrophic fibrillation, chronic eosinophilia; has been having wheezing and shortness of breath for the past few months much worse for the past 2 weeks as well as a cough. Yesterday evening, returning from an outing just before she got into a house she became dizzy and collapsed. Did not lose consciousness but on arrival to the emergency room evaluation found her to the markedly hyponatremic.   She has been taking Lasix long term for peripheral edema, and  her usage has not increased   She has been feeling very thirsty recently and has been drinking excessive amounts of water; however while in the emergency room on on the ward she's been having great difficulty passing urine, and has only passed a scant amount, despite having received a liter of normal saline.     Hospital Course:  Active Problems:   DIABETES MELLITUS, TYPE II: A1c only 5.8. Patient while continued on home dose of glipizide plus sliding-scale restricted diet accident episodes of hypoglycemia. Would recommend PCP to consider decreasing her glipizide dose, but will defer to her.     HYPERTENSION: Stable.     Atrial fibrillation, chronic: Stable. INR therapeutic.     Degenerative joint disease of knee: Stable medical issue. Embolus with walker.     BREAST CANCER, HX OF: Stable.     Chronic anticoagulation INR therapeutic.     Sleep apnea: Patient is nightly CPAP machine as it is broken. He is to follow up and have it replaced.     Obesity: Stable     Depression: Stable. Celexa dose decreased. See below.     Hypothyroidism: TSH normal.     Gait instability: Stable. Embolus with walker.     Difficulty hearing: Stable.     Hyponatremia: In discussion with the family, the patient's increased fluid intake has actually been going on for a lot longer. Suspect cause may be  due to 2 Celexa which is recommended given patient's age at maximum dose of 20 mg and patient is currently on 40 mg daily. In literature, Celexa reported to cause hyponatremia. His the only one of her medication she is on that might account for this. Serum and urine osmolarity normal. 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 Patient immediately put on fluid restriction and started on Lasix and IV fluids which were started on [place] were discontinued. Sodium improved nicely to 130 by hospital day 2. Discussed with family and the plan will be to continue fluid restriction at home. PCP will followup.    Bronchospasm: Patient treated with albuterol nebulizers plus Atrovent. Breathing easier. Did not require Solu-Medrol. Other than the emergency room. Will discharge with prescription for Spiriva and when necessary Atrovent nebs. Pulmonary consulted during hospitalization. Recommended continue management as she clearly doing better. He will see her as outpatient.     Syncope and collapse: Felt to be secondary to hyponatremia plus some mild hypoxia from chronic lung issues     Procedures: None   Consultations: Kari Baars, pulmonary medicine   Discharge Exam: Filed Vitals:     11/12/12 1610  11/12/12 0648  11/12/12 1155  11/12/12 1325   BP:  142/74      108/41   Pulse:  83      97   Temp:  98.3 F (36.8 C)      97.9 F (36.6 C)   TempSrc:  Oral         Resp:  19      18   Height:           Weight:           SpO2:  95%  96%  96%  97%      General: Alert and oriented x3, mildly jittery secondary to nebulizers and hypoglycemia Cardiovascular: Regular rhythm, borderline tachycardia Lungs: Decreased breath sounds throughout Abdomen: Soft, obese, nontender, positive bowel sounds Extremities: Trace pitting edema   Discharge Instructions    Discharge Orders     Future Appointments  Provider  Department  Dept Phone     12/03/2012 10:45 AM  Salley Scarlet, MD  [facility]  [phone]     12/09/2012 3:10 PM  [facility]  [facility] [place]  [phone]     Future Orders  Complete By  Expires        Diet - low sodium heart healthy   As directed         Diet - low sodium heart healthy   As directed         Comments:         No more than 1500 cc of fluid daily (1.5 L)       Increase activity slowly   As directed              Medication List      TAKE these medications           ACCU-CHEK FASTCLIX LANCETS Misc   1 each by Does not apply route daily. Use as directed with Accuchek meter once daily         albuterol 108 (90 BASE)  MCG/ACT inhaler   Commonly known as:  PROVENTIL HFA;VENTOLIN HFA   Inhale 2 puffs into the lungs every 4 (four) hours as needed for wheezing.         albuterol (2.5 MG/3ML) 0.083% nebulizer solution   Commonly known as:  PROVENTIL  Take 3 mLs (2.5 mg total) by nebulization every 6 (six) hours as needed for wheezing.         amLODipine 5 MG tablet   Commonly known as:  NORVASC   Take 5 mg by mouth daily.         calcium carbonate 600 MG Tabs   Commonly known as:  OS-CAL   Take 600 mg by mouth daily.         citalopram 40 MG tablet   Commonly known as:  CELEXA   Take 0.5 tablets (20 mg total) by mouth daily.         clotrimazole-betamethasone cream   Commonly known as:  LOTRISONE   Apply 1 application topically 2 (two) times daily.         diclofenac sodium 1 % Gel   Commonly known as:  VOLTAREN   Apply 2 g topically daily as needed (for knee and joint pain).         furosemide 40 MG tablet   Commonly known as:  LASIX   Take 1 tablet (40 mg total) by mouth daily. For fluid         glimepiride 1 MG tablet   Commonly known as:  AMARYL   Take 1 mg by mouth daily before breakfast.         glucose blood test strip   Commonly known as:  ACCU-CHEK AVIVA   Use as instructed x once daily    Dx 250.00         HYDROcodone-acetaminophen 5-325 MG per tablet   Commonly known as:  NORCO   Take 1 tablet by mouth every 6 (six) hours as needed for pain.         Krill Oil 500 MG Caps   Take 1 capsule by mouth daily.         levothyroxine 50 MCG tablet   Commonly known as:  SYNTHROID, LEVOTHROID   Take 50 mcg by mouth daily.         lisinopril 40 MG tablet   Commonly known as:  PRINIVIL,ZESTRIL   Take 40 mg by mouth daily.         LORazepam 0.5 MG tablet   Commonly known as:  ATIVAN   Take 1 tablet (0.5 mg total) by mouth 2 (two) times daily.         metoprolol succinate 25 MG 24 hr tablet   Commonly known as:  TOPROL-XL   Take 25 mg by mouth daily.          multivitamin tablet   Take 1 tablet by mouth daily.         omeprazole 20 MG capsule   Commonly known as:  PRILOSEC   Take 1 capsule (20 mg total) by mouth 2 (two) times daily. For stomach acid         polyethylene glycol powder powder   Commonly known as:  MIRALAX   1 cap full in clear liquid daily, hold for loose stools         potassium chloride SA 20 MEQ tablet   Commonly known as:  K-DUR,KLOR-CON   Take 1 tablet (20 mEq total) by mouth daily. For potassium and fluid pill         simvastatin 20 MG tablet   Commonly known as:  ZOCOR   Take 1 tablet (20 mg total) by mouth at bedtime.         tiotropium 18 MCG inhalation capsule  Commonly known as:  SPIRIVA HANDIHALER   Place 1 capsule (18 mcg total) into inhaler and inhale daily.         warfarin 5 MG tablet   Commonly known as:  COUMADIN   Take 5 mg by mouth daily. Takes at 1700.                 Follow-up Information     Follow up with [name], MD. Schedule an appointment as soon as possible for a visit in 3 weeks.     Contact information:     [address] [place] [address] [phone]           Follow up with HAWKINS,EDWARD L, MD. Schedule an appointment as soon as possible for a visit in 1 month.     Contact information:     [address] [place] [address] [phone]           --------------------------------------------------------------------------------   The results of significant diagnostics from this hospitalization (including imaging, microbiology, ancillary and laboratory) are listed below for reference.       Significant Diagnostic Studies: Dg Chest 2 View   11/10/2012 IMPRESSION:  1.  Cardiomegaly, without overt edema. 2.  Tortuous and atherosclerotic thoracic aorta.   Original Report Authenticated By: Gaylyn Rong, M.D.     Dg Chest 2 View   10/15/2012    IMPRESSION:  1.  Stable cardiomegaly without failure. 2.  Status post  CABG. 3.  No acute cardiopulmonary disease.   Original Report Authenticated By: Marin Roberts, M.D.     Dg Chest Port 1 View   11/11/2012 IMPRESSION: PICC line tip mid superior vena cava level.  Please see above.  This is a call report.   Original Report Authenticated By: Lacy Duverney, M.D.     Dg Shoulder Left   11/10/2012 IMPRESSION:  1.  Degenerative AC joint arthropathy.   Otherwise, no significant abnormality identified.   Original Report Authenticated By: Gaylyn Rong, M.D.       Microbiology: No results found for this or any previous visit (from the past 240 hour(s)).    Labs: Basic Metabolic Panel: Recent Labs Lab  11/10/12 2021  11/11/12 0108    11/11/12 0856  11/11/12 1440  11/11/12 2051  11/12/12 0245  11/12/12 0852   NA  117*  118*   < >  122*  118*  118*  123*  130*   K  3.9  4.1   < >  4.7  4.2  3.9  3.9  3.9   CL  82*  84*   < >  89*  86*  85*  90*  94*   CO2  23  24   < >  24  24  24  25  26    GLUCOSE  108*  134*   < >  88  101*  120*  95  96   BUN  7  6   < >  5*  4*  4*  4*  3*   CREATININE  0.48*  0.44*   < >  0.45*  0.50  0.51  0.52  0.53   CALCIUM  8.9  8.4   < >  8.4  8.1*  8.4  8.5  8.9   MG   --   2.0   --    --    --    --    --    --     < > =  values in this interval not displayed. Liver Function Tests: Recent Labs Lab  11/11/12 0108   AST  23   ALT  10   ALKPHOS  65   BILITOT  0.4   PROT  7.0   ALBUMIN  3.7    CBC: Recent Labs Lab  11/10/12 2021  11/11/12 0438   WBC  9.3  9.1   NEUTROABS  4.6   --    HGB  12.6  11.0*   HCT  35.0*  31.0*   MCV  80.5  81.4   PLT  335  315    BNP: BNP (last 3 results) Recent Labs   11/11/12 0108   PROBNP  354.6    CBG: Recent Labs Lab  11/11/12 0721  11/11/12 1141  11/11/12 1649  11/11/12 2121  11/12/12 0831   GLUCAP  97  82  89  145*  88            Signed:   Lorrin Bodner K             [name] 11/12/2012, 1:49 PM

## 2012-11-12 NOTE — Discharge Planning (Deleted)
Physician Discharge Summary  Kristin Logan ZOX:[id number] DOB: 14-Jan-1932 DOA: 11/10/2012  PCP: [name], MD  Admit date: 11/10/2012 Discharge date: 11/12/2012  Time spent: 30 minutes  Recommendations for Outpatient Follow-up:  1. Patient will follow up with her primary care physician in the next few weeks. Her physician can determine whether the decrease in Celexa is improving her hyponatremia and give her a trial of decreasing her fluid restriction  Discharge Diagnoses:  Active Problems:   DIABETES MELLITUS, TYPE II   HYPERTENSION   Atrial fibrillation, chronic   Degenerative joint disease of knee   BREAST CANCER, HX OF   Chronic anticoagulation   Sleep apnea   Obesity   Depression   Hypothyroidism   Gait instability   Difficulty hearing   Hyponatremia   Bronchospasm   Syncope and collapse   Discharge Condition: Improved, being discharged home  Diet recommendation: Heart healthy with a 1500 cc fluid restriction  Filed Weights   11/11/12 0645 11/12/12 0500 11/12/12 0629  Weight: 108.863 kg (240 lb) 106.5 kg (234 lb 12.6 oz) 106.5 kg (234 lb 12.6 oz)    History of present illness:  Kristin Logan is an 79 y.o. female. Elderly Caucasian lady who  has multiple medical problems including gait instability related to arthritis of the knees, diabetes hypertension, chronic atrophic fibrillation, chronic eosinophilia; has been having wheezing and shortness of breath for the past few months much worse for the past 2 weeks as well as a cough. Yesterday evening, returning from an outing just before she got into a house she became dizzy and collapsed. Did not lose consciousness but on arrival to the emergency room evaluation found her to the markedly hyponatremic.  She has been taking Lasix long term for peripheral edema, and her usage has not increased  She has been feeling very thirsty recently and has been drinking excessive amounts of water; however while in the  emergency room on on the ward she's been having great difficulty passing urine, and has only passed a scant amount, despite having received a liter of normal saline.   Hospital Course:  Active Problems:   DIABETES MELLITUS, TYPE II: A1c only 5.8. Patient while continued on home dose of glipizide plus sliding-scale restricted diet accident episodes of hypoglycemia. Would recommend PCP to consider decreasing her glipizide dose, but will defer to her.    HYPERTENSION: Stable.    Atrial fibrillation, chronic: Stable. INR therapeutic.    Degenerative joint disease of knee: Stable medical issue. Embolus with walker.    BREAST CANCER, HX OF: Stable.    Chronic anticoagulation INR therapeutic.    Sleep apnea: Patient is nightly CPAP machine as it is broken. He is to follow up and have it replaced.    Obesity: Stable    Depression: Stable. Celexa dose decreased. See below.    Hypothyroidism: TSH normal.    Gait instability: Stable. Embolus with walker.    Difficulty hearing: Stable.    Hyponatremia: In discussion with the family, the patient's increased fluid intake has actually been going on for a lot longer. Suspect cause may be due to 2 Celexa which is recommended given patient's age at maximum dose of 20 mg and patient is currently on 40 mg daily. In literature, Celexa reported to cause hyponatremia. His the only one of her medication she is on that might account for this. Serum and urine osmolarity normal. 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 Patient immediately put on fluid restriction and started on Lasix  and IV fluids which were started on [place] were discontinued. Sodium improved nicely to 130 by hospital day 2. Discussed with family and the plan will be to continue fluid restriction at home. PCP will followup.   Bronchospasm: Patient treated with albuterol nebulizers plus Atrovent. Breathing easier. Did not require Solu-Medrol. Other than the emergency room. Will discharge with prescription  for Spiriva and when necessary Atrovent nebs. Pulmonary consulted during hospitalization. Recommended continue management as she clearly doing better. He will see her as outpatient.    Syncope and collapse: Felt to be secondary to hyponatremia plus some mild hypoxia from chronic lung issues   Procedures:  None  Consultations: Kari Baars, pulmonary medicine  Discharge Exam: Filed Vitals:   11/12/12 1610 11/12/12 0648 11/12/12 1155 11/12/12 1325  BP: 142/74   108/41  Pulse: 83   97  Temp: 98.3 F (36.8 C)   97.9 F (36.6 C)  TempSrc: Oral     Resp: 19   18  Height:      Weight:      SpO2: 95% 96% 96% 97%    General: Alert and oriented x3, mildly jittery secondary to nebulizers and hypoglycemia Cardiovascular: Regular rhythm, borderline tachycardia Lungs: Decreased breath sounds throughout Abdomen: Soft, obese, nontender, positive bowel sounds Extremities: Trace pitting edema  Discharge Instructions  Discharge Orders   Future Appointments Provider Department Dept Phone   12/03/2012 10:45 AM Salley Scarlet, MD [facility] [phone]   12/09/2012 3:10 PM [facility] [place] [phone]   Future Orders Complete By Expires     Diet - low sodium heart healthy  As directed     Diet - low sodium heart healthy  As directed     Comments:      No more than 1500 cc of fluid daily (1.5 L)    Increase activity slowly  As directed         Medication List    TAKE these medications       ACCU-CHEK FASTCLIX LANCETS Misc  1 each by Does not apply route daily. Use as directed with Accuchek meter once daily     albuterol 108 (90 BASE) MCG/ACT inhaler  Commonly known as:  PROVENTIL HFA;VENTOLIN HFA  Inhale 2 puffs into the lungs every 4 (four) hours as needed for wheezing.     albuterol (2.5 MG/3ML) 0.083% nebulizer solution  Commonly known as:  PROVENTIL  Take 3 mLs (2.5 mg total) by nebulization every 6 (six) hours as needed  for wheezing.     amLODipine 5 MG tablet  Commonly known as:  NORVASC  Take 5 mg by mouth daily.     calcium carbonate 600 MG Tabs  Commonly known as:  OS-CAL  Take 600 mg by mouth daily.     citalopram 40 MG tablet  Commonly known as:  CELEXA  Take 0.5 tablets (20 mg total) by mouth daily.     clotrimazole-betamethasone cream  Commonly known as:  LOTRISONE  Apply 1 application topically 2 (two) times daily.     diclofenac sodium 1 % Gel  Commonly known as:  VOLTAREN  Apply 2 g topically daily as needed (for knee and joint pain).     furosemide 40 MG tablet  Commonly known as:  LASIX  Take 1 tablet (40 mg total) by mouth daily. For fluid     glimepiride 1 MG tablet  Commonly known as:  AMARYL  Take 1 mg by mouth daily before breakfast.  glucose blood test strip  Commonly known as:  ACCU-CHEK AVIVA  Use as instructed x once daily   Dx 250.00     HYDROcodone-acetaminophen 5-325 MG per tablet  Commonly known as:  NORCO  Take 1 tablet by mouth every 6 (six) hours as needed for pain.     Krill Oil 500 MG Caps  Take 1 capsule by mouth daily.     levothyroxine 50 MCG tablet  Commonly known as:  SYNTHROID, LEVOTHROID  Take 50 mcg by mouth daily.     lisinopril 40 MG tablet  Commonly known as:  PRINIVIL,ZESTRIL  Take 40 mg by mouth daily.     LORazepam 0.5 MG tablet  Commonly known as:  ATIVAN  Take 1 tablet (0.5 mg total) by mouth 2 (two) times daily.     metoprolol succinate 25 MG 24 hr tablet  Commonly known as:  TOPROL-XL  Take 25 mg by mouth daily.     multivitamin tablet  Take 1 tablet by mouth daily.     omeprazole 20 MG capsule  Commonly known as:  PRILOSEC  Take 1 capsule (20 mg total) by mouth 2 (two) times daily. For stomach acid     polyethylene glycol powder powder  Commonly known as:  MIRALAX  1 cap full in clear liquid daily, hold for loose stools     potassium chloride SA 20 MEQ tablet  Commonly known as:  K-DUR,KLOR-CON  Take 1 tablet  (20 mEq total) by mouth daily. For potassium and fluid pill     simvastatin 20 MG tablet  Commonly known as:  ZOCOR  Take 1 tablet (20 mg total) by mouth at bedtime.     tiotropium 18 MCG inhalation capsule  Commonly known as:  SPIRIVA HANDIHALER  Place 1 capsule (18 mcg total) into inhaler and inhale daily.     warfarin 5 MG tablet  Commonly known as:  COUMADIN  Take 5 mg by mouth daily. Takes at 1700.           Follow-up Information   Follow up with [name], MD. Schedule an appointment as soon as possible for a visit in 3 weeks.   Contact information:   [address] [place] [address] [phone]       Follow up with HAWKINS,EDWARD L, MD. Schedule an appointment as soon as possible for a visit in 1 month.   Contact information:   [address] [place] [address] [phone]        The results of significant diagnostics from this hospitalization (including imaging, microbiology, ancillary and laboratory) are listed below for reference.    Significant Diagnostic Studies: Dg Chest 2 View  11/10/2012 IMPRESSION:  1.  Cardiomegaly, without overt edema. 2.  Tortuous and atherosclerotic thoracic aorta.   Original Report Authenticated By: Gaylyn Rong, M.D.    Dg Chest 2 View  10/15/2012    IMPRESSION:  1.  Stable cardiomegaly without failure. 2.  Status post CABG. 3.  No acute cardiopulmonary disease.   Original Report Authenticated By: Marin Roberts, M.D.    Dg Chest Port 1 View  11/11/2012 IMPRESSION: PICC line tip mid superior vena cava level.  Please see above.  This is a call report.   Original Report Authenticated By: Lacy Duverney, M.D.    Dg Shoulder Left  11/10/2012 IMPRESSION:  1.  Degenerative AC joint arthropathy.   Otherwise, no significant abnormality identified.   Original Report Authenticated By: Gaylyn Rong,  M.D.     Microbiology: No results found for this or any previous visit (from the past  240 hour(s)).   Labs: Basic Metabolic Panel:  Recent Labs Lab 11/10/12 2021 11/11/12 0108  11/11/12 0856 11/11/12 1440 11/11/12 2051 11/12/12 0245 11/12/12 0852  NA 117* 118*  < > 122* 118* 118* 123* 130*  K 3.9 4.1  < > 4.7 4.2 3.9 3.9 3.9  CL 82* 84*  < > 89* 86* 85* 90* 94*  CO2 23 24  < > 24 24 24 25 26   GLUCOSE 108* 134*  < > 88 101* 120* 95 96  BUN 7 6  < > 5* 4* 4* 4* 3*  CREATININE 0.48* 0.44*  < > 0.45* 0.50 0.51 0.52 0.53  CALCIUM 8.9 8.4  < > 8.4 8.1* 8.4 8.5 8.9  MG  --  2.0  --   --   --   --   --   --   < > = values in this interval not displayed. Liver Function Tests:  Recent Labs Lab 11/11/12 0108  AST 23  ALT 10  ALKPHOS 65  BILITOT 0.4  PROT 7.0  ALBUMIN 3.7   CBC:  Recent Labs Lab 11/10/12 2021 11/11/12 0438  WBC 9.3 9.1  NEUTROABS 4.6  --   HGB 12.6 11.0*  HCT 35.0* 31.0*  MCV 80.5 81.4  PLT 335 315   BNP: BNP (last 3 results)  Recent Labs  11/11/12 0108  PROBNP 354.6   CBG:  Recent Labs Lab 11/11/12 0721 11/11/12 1141 11/11/12 1649 11/11/12 2121 11/12/12 0831  GLUCAP 97 82 89 145* 88       Signed:  Mehreen Azizi K  [name] 11/12/2012, 1:49 PM

## 2012-11-12 NOTE — Progress Notes (Signed)
I attempted and worked with pt to wear CPAP machine and first patient stated straps were to tight, then stated that the machine pushing on her cheeks where she feel and it was sore so I put padding to ease the pressure, then she said the machine was smothering her that she just can't wear it. Machine taken off and RN notified.

## 2012-11-12 NOTE — Consult Note (Signed)
Kristin Logan, Kristin Logan NO.:  [id number]  MEDICAL RECORD NO.:  [id number]  LOCATION:  A301                          FACILITY:  [facility]  PHYSICIAN:  Reilynn Lauro L. Juanetta Gosling, M.D.DATE OF BIRTH:  02-May-1932  DATE OF CONSULTATION: DATE OF DISCHARGE:                                CONSULTATION   The patient has a [facility].  Consultation requested for COPD exacerbation.  HISTORY:  This is a 79 year old, who came to the hospital because of increasing shortness of breath particularly over the last 2 weeks.  She has had cough and congestion.  She has had some wheezing.  She has been using inhalers, but it has not helped.  She had been an outside.  She became dizzy and collapsed, and was found to be hyponatremic.  She has had some vomiting as well.  PAST MEDICAL HISTORY:  Positive for: 1. Diabetes. 2. Chronic atrial fibrillation. 3. COPD. 4. Sleep apnea. 5. Aortic valve disease. 6. Hypertension. 7. Atrial fibrillation. 8. GERD. 9. Aortic stenosis. 10.History of carcinoma of the breast.  PAST SURGICAL HISTORY:  Positive for a mastectomy, colonoscopy, cardiac valve replacement.  MEDICATIONS:  Include albuterol and amlodipine, these were at home.  Os- Cal, citalopram, Lotrisone, Voltaren gel, Lasix, Amaryl, Norco, Krill oil, Synthroid, Prinivil, Ativan, metoprolol, multiple vitamins, Prilosec, MiraLax, potassium, simvastatin, Coumadin, and albuterol.  ALLERGIES:  She is allergic to NONSTEROIDALS, which caused her to have pulses and to CODEINE.  SOCIAL HISTORY:  She is a nonsmoker.  She does not use any alcohol.  FAMILY HISTORY:  Her mother had heart disease.  Her father died of a stroke.  She has cancer in her family history as well.  PHYSICAL EXAMINATION:  GENERAL:  She is a thin female, who is in no acute distress. VITAL SIGNS:  Her blood pressure about 150/76, pulse 66, respirations 18. HEENT:  Pupils are reactive.  Nose and throat are clear.   Mucous membranes are moist. NECK:  Supple without masses. CHEST:  Shows some wheezing. HEART:  Irregular without gallop. ABDOMEN:  Soft.  No masses are felt. EXTREMITIES:  No edema. CENTRAL NERVOUS SYSTEM:  Grossly intact, but she seems slightly confused.  Her sodium was 117, BUN of 7, creatinine 0.48.  White count 9.3, hematocrit 35, hemoglobin 12.6.  Chest x-ray shows cardiomegaly without edema.  ASSESSMENT:  She has what appears to be syncope.  I think she has chronic obstructive pulmonary disease exacerbation.  She is markedly hyponatremic.  PLAN:  I would continue with her previous treatments including inhaled bronchodilators, IV fluids.  I would not change any of her treatments right now as I think she is improving.     Arkie Tagliaferro L. Juanetta Gosling, M.D.     ELH/MEDQ  D:  11/11/2012  T:  11/12/2012  Job:  [id number]

## 2012-11-12 NOTE — Progress Notes (Signed)
Physical Therapy Treatment Patient Details Name: MENNIE SPILLER MRN: [id number] DOB: 11-Sep-1932 Today's Date: 11/12/2012 Time: 4098-1191 PT Time Calculation (min): 40 min  PT Assessment / Plan / Recommendation Comments on Treatment Session  Pt is progressing extremely well.  Now, her major c/o is that of degenerated knees for which she needs TKRs.  She transfers with no assist and was able to ambulate at least 300' with walker and no instability. At this point, she would actually be appropriate for outpatient PT for strengthening as she is beyond the need for HHPT.    Follow Up Recommendations  Outpatient PT     Does the patient have the potential to tolerate intense rehabilitation     Barriers to Discharge        Equipment Recommendations       Recommendations for Other Services    Frequency     Plan Discharge plan needs to be updated    Precautions / Restrictions Restrictions Weight Bearing Restrictions: No   Pertinent Vitals/Pain     Mobility  Bed Mobility Supine to Sit: 6: Modified independent (Device/Increase time);HOB elevated Transfers Sit to Stand: 6: Modified independent (Device/Increase time);With upper extremity assist;From bed;From chair/3-in-1 Stand to Sit: 6: Modified independent (Device/Increase time);With upper extremity assist;To chair/3-in-1;To bed Ambulation/Gait Ambulation/Gait Assistance: 5: Supervision Ambulation Distance (Feet): 300 Feet Assistive device: 4-wheeled walker Ambulation/Gait Assistance Details: no gait instability Gait Pattern: Within Functional Limits Gait velocity: WNL, but several rest stops needed during gait Stairs: No Wheelchair Mobility Wheelchair Mobility: No    Exercises     PT Diagnosis:    PT Problem List:   PT Treatment Interventions:     PT Goals Acute Rehab PT Goals Pt will Ambulate: with modified independence;>150 feet PT Goal: Ambulate - Progress: Updated due to goal met  Visit Information  Last PT  Received On: 11/12/12    Subjective Data  Subjective: I'm feeling better but my knees give way   Cognition       Balance     End of Session PT - End of Session Equipment Utilized During Treatment: Gait belt Activity Tolerance: Patient tolerated treatment well Patient left: in chair;with call bell/phone within reach   GP     Myrlene Broker L 11/12/2012, 11:12 AM

## 2012-11-13 ENCOUNTER — Emergency Department (HOSPITAL_COMMUNITY): Payer: Medicare Other

## 2012-11-13 ENCOUNTER — Emergency Department (HOSPITAL_COMMUNITY)
Admission: EM | Admit: 2012-11-13 | Discharge: 2012-11-13 | Disposition: A | Discharge status: Home / Self Care | Payer: Medicare Other | Attending: Emergency Medicine | Admitting: Emergency Medicine

## 2012-11-13 ENCOUNTER — Encounter (HOSPITAL_COMMUNITY): Payer: Self-pay

## 2012-11-13 DIAGNOSIS — Y939 Activity, unspecified: Secondary | ICD-10-CM | POA: Insufficient documentation

## 2012-11-13 DIAGNOSIS — Z8679 Personal history of other diseases of the circulatory system: Secondary | ICD-10-CM | POA: Insufficient documentation

## 2012-11-13 DIAGNOSIS — G4730 Sleep apnea, unspecified: Secondary | ICD-10-CM | POA: Insufficient documentation

## 2012-11-13 DIAGNOSIS — Z7901 Long term (current) use of anticoagulants: Secondary | ICD-10-CM | POA: Insufficient documentation

## 2012-11-13 DIAGNOSIS — Z79899 Other long term (current) drug therapy: Secondary | ICD-10-CM | POA: Insufficient documentation

## 2012-11-13 DIAGNOSIS — E119 Type 2 diabetes mellitus without complications: Secondary | ICD-10-CM | POA: Insufficient documentation

## 2012-11-13 DIAGNOSIS — Z8739 Personal history of other diseases of the musculoskeletal system and connective tissue: Secondary | ICD-10-CM | POA: Insufficient documentation

## 2012-11-13 DIAGNOSIS — S72109A Unspecified trochanteric fracture of unspecified femur, initial encounter for closed fracture: Secondary | ICD-10-CM | POA: Insufficient documentation

## 2012-11-13 DIAGNOSIS — I10 Essential (primary) hypertension: Secondary | ICD-10-CM | POA: Insufficient documentation

## 2012-11-13 DIAGNOSIS — R296 Repeated falls: Secondary | ICD-10-CM | POA: Insufficient documentation

## 2012-11-13 DIAGNOSIS — Y92009 Unspecified place in unspecified non-institutional (private) residence as the place of occurrence of the external cause: Secondary | ICD-10-CM | POA: Insufficient documentation

## 2012-11-13 DIAGNOSIS — Z8711 Personal history of peptic ulcer disease: Secondary | ICD-10-CM | POA: Insufficient documentation

## 2012-11-13 DIAGNOSIS — Z862 Personal history of diseases of the blood and blood-forming organs and certain disorders involving the immune mechanism: Secondary | ICD-10-CM | POA: Insufficient documentation

## 2012-11-13 DIAGNOSIS — S72112A Displaced fracture of greater trochanter of left femur, initial encounter for closed fracture: Secondary | ICD-10-CM

## 2012-11-13 DIAGNOSIS — Z853 Personal history of malignant neoplasm of breast: Secondary | ICD-10-CM | POA: Insufficient documentation

## 2012-11-13 DIAGNOSIS — Z8719 Personal history of other diseases of the digestive system: Secondary | ICD-10-CM | POA: Insufficient documentation

## 2012-11-13 DIAGNOSIS — Z9981 Dependence on supplemental oxygen: Secondary | ICD-10-CM | POA: Insufficient documentation

## 2012-11-13 DIAGNOSIS — E785 Hyperlipidemia, unspecified: Secondary | ICD-10-CM | POA: Insufficient documentation

## 2012-11-13 MED ORDER — TRAMADOL HCL 50 MG PO TABS: 50.0000 mg | ORAL_TABLET | Freq: Four times a day (QID) | ORAL | Status: DC | PRN

## 2012-11-13 NOTE — ED Notes (Signed)
Pt states she fell two days ago and was admitted here. States she did not have any x-rays. Now complain of pain in left hip. Bruising to left arm and hip

## 2012-11-13 NOTE — ED Provider Notes (Signed)
History    This chart was scribed for Donnetta Hutching, MD by Charolett Bumpers, ED Scribe. The patient was seen in room APA01/APA01. Patient's care was started at 1001.   CSN: [id number]  Arrival date & time 11/13/12  4098   First MD Initiated Contact with Patient 11/13/12 1001      Chief Complaint  Patient presents with  . Hip Pain    The history is provided by the patient and a relative. No language interpreter was used.   Kristin Logan is a 79 y.o. female who presents to the Emergency Department complaining of constant, moderate lateral left hip pain. She states that she fell at home 2 days ago after losing her balance. Daughter states the pt landing directly on her left hip. She was seen here in ED at [facility] where labs showed that her sodium was low and she was admitted. She was discharged home last night. She state the hip pain has persisted and is aggravated with bearing weight. She ambulates with a walker. She also has bruising to the left inner bicep and hip.    Past Medical History  Diagnosis Date  . Diabetes mellitus   . Hyperlipidemia   . Eosinophilia   . Cataract   . Hypertension   . Atrial fibrillation prior to 2008    moderate biatrial enlargement in 2009; mild to moderate LVH with a low-normal EF  . GERD (gastroesophageal reflux disease)     Hemorrhoids; history of peptic ulcer disease  . DJD (degenerative joint disease) of knee   . Sleep apnea     CPAP  . Chronic anticoagulation   . Aortic valve disease     2009: mild stenosis and regurgitation; peak gradient of 30-35 mmHg , subsequent AVR at [facility]  . Incontinence     Mild  . Carcinoma of breast     Right mastectomy; radiation and hormonal therapy    Past Surgical History  Procedure Laterality Date  . Mastectomy partial / lumpectomy w/ axillary lymphadenectomy  2008    Right for carcinoma;  . Exploration post operative open heart    . Colonoscopy      Approximately 2000  . Cardiac valve  replacement      [facility]    Family History  Problem Relation Age of Onset  . Heart disease Mother   . Stroke Father   . Cancer Sister   . Heart disease Sister   . Stroke Sister   . Stroke Brother     History  Substance Use Topics  . Smoking status: Never Smoker   . Smokeless tobacco: Not on file  . Alcohol Use: No    OB History   Grav Para Term Preterm Abortions TAB SAB Ect Mult Living                  Review of Systems A complete 10 system review of systems was obtained and all systems are negative except as noted in the HPI and PMH.   Allergies  Nsaids and Codeine  Home Medications   Current Outpatient Rx  Name  Route  Sig  Dispense  Refill  . ACCU-CHEK FASTCLIX LANCETS MISC   Does not apply   1 each by Does not apply route daily. Use as directed with Accuchek meter once daily   102 each   6   . albuterol (PROVENTIL HFA;VENTOLIN HFA) 108 (90 BASE) MCG/ACT inhaler   Inhalation   Inhale 2 puffs into the lungs  every 4 (four) hours as needed for wheezing.   1 Inhaler   2   . albuterol (PROVENTIL) (2.5 MG/3ML) 0.083% nebulizer solution   Nebulization   Take 3 mLs (2.5 mg total) by nebulization every 6 (six) hours as needed for wheezing.   75 mL   3   . amLODipine (NORVASC) 5 MG tablet   Oral   Take 5 mg by mouth daily.         . calcium carbonate (OS-CAL) 600 MG TABS   Oral   Take 600 mg by mouth daily.         . citalopram (CELEXA) 40 MG tablet   Oral   Take 0.5 tablets (20 mg total) by mouth daily.   30 tablet   0   . clotrimazole-betamethasone (LOTRISONE) cream   Topical   Apply 1 application topically 2 (two) times daily.         . diclofenac sodium (VOLTAREN) 1 % GEL   Topical   Apply 2 g topically daily as needed (for knee and joint pain).         . furosemide (LASIX) 40 MG tablet   Oral   Take 1 tablet (40 mg total) by mouth daily. For fluid   30 tablet   6   . glimepiride (AMARYL) 1 MG tablet   Oral   Take 1 mg by mouth  daily before breakfast.         . glucose blood (ACCU-CHEK AVIVA) test strip      Use as instructed x once daily  Dx 250.00   50 each   6   . HYDROcodone-acetaminophen (NORCO) 5-325 MG per tablet   Oral   Take 1 tablet by mouth every 6 (six) hours as needed for pain.   45 tablet   1   . Krill Oil 500 MG CAPS   Oral   Take 1 capsule by mouth daily.         Marland Kitchen levothyroxine (SYNTHROID, LEVOTHROID) 50 MCG tablet   Oral   Take 50 mcg by mouth daily.         Marland Kitchen lisinopril (PRINIVIL,ZESTRIL) 40 MG tablet   Oral   Take 40 mg by mouth daily.         Marland Kitchen LORazepam (ATIVAN) 0.5 MG tablet   Oral   Take 1 tablet (0.5 mg total) by mouth 2 (two) times daily.   60 tablet   1   . metoprolol succinate (TOPROL-XL) 25 MG 24 hr tablet   Oral   Take 25 mg by mouth daily.         . Multiple Vitamin (MULTIVITAMIN) tablet   Oral   Take 1 tablet by mouth daily.         Marland Kitchen omeprazole (PRILOSEC) 20 MG capsule   Oral   Take 1 capsule (20 mg total) by mouth 2 (two) times daily. For stomach acid   60 capsule   6   . polyethylene glycol powder (MIRALAX) powder      1 cap full in clear liquid daily, hold for loose stools   850 g   6   . potassium chloride SA (K-DUR,KLOR-CON) 20 MEQ tablet   Oral   Take 1 tablet (20 mEq total) by mouth daily. For potassium and fluid pill   30 tablet   6   . simvastatin (ZOCOR) 20 MG tablet   Oral   Take 1 tablet (20 mg total) by mouth at bedtime.  30 tablet   6   . tiotropium (SPIRIVA HANDIHALER) 18 MCG inhalation capsule   Inhalation   Place 1 capsule (18 mcg total) into inhaler and inhale daily.   30 capsule   12   . warfarin (COUMADIN) 5 MG tablet   Oral   Take 5 mg by mouth daily. Takes at 1700.           BP 140/86  Pulse 63  Temp(Src) 97.8 F (36.6 C) (Oral)  Resp 18  Ht 5\' 6"  (1.676 m)  Wt 218 lb (98.884 kg)  BMI 35.2 kg/m2  SpO2 99%  Physical Exam  Nursing note and vitals reviewed. Constitutional: She is  oriented to person, place, and time. She appears well-developed and well-nourished.  HENT:  Head: Normocephalic and atraumatic.  Eyes: Conjunctivae and EOM are normal. Pupils are equal, round, and reactive to light.  Neck: Normal range of motion. Neck supple.  Cardiovascular: Normal rate, regular rhythm and normal heart sounds.   Pulmonary/Chest: Effort normal and breath sounds normal.  Abdominal: Soft. Bowel sounds are normal.  Musculoskeletal: Normal range of motion. She exhibits tenderness.  Minimal tenderness to the left lateral hip.   Neurological: She is alert and oriented to person, place, and time.  Skin: Skin is warm and dry.  Bruising to the left inner bicep and left hip.   Psychiatric: She has a normal mood and affect.    ED Course  Procedures (including critical care time)  DIAGNOSTIC STUDIES: Oxygen Saturation is 99% on room air, normal by my interpretation.    COORDINATION OF CARE:  11:05-Discussed planned course of treatment with the patient and family including a CT scan of the hip, who is agreeable at this time.    Labs Reviewed - No data to display Dg Hip Complete Left  11/13/2012  *RADIOLOGY REPORT*  Clinical Data: Fall.  Left hip pain.  LEFT HIP - COMPLETE 2+ VIEW  Comparison: None.  Findings: Both hips are located.  No fracture is identified.  No notable degenerative change is seen.  Atherosclerosis noted.  IMPRESSION: No acute finding.   Original Report Authenticated By: Holley Dexter, M.D.    Ct Hip Left Wo Contrast  11/13/2012  *RADIOLOGY REPORT*  Clinical Data: Fall.  Left hip pain.  CT OF THE LEFT HIP WITHOUT CONTRAST  Technique:  Multidetector CT imaging was performed according to the standard protocol. Multiplanar CT image reconstructions were also generated.  Comparison: Plain films earlier this same date.  Findings: Both hips are located. The patient has a nondisplaced fracture of the posterior greater trochanter.  No other fracture is identified.  No  hip joint effusion is seen.  Surrounding musculature is unremarkable. No notable degenerative disease seen about the hips.  There is some atherosclerotic vascular disease. Imaged intrapelvic contents are unremarkable.  IMPRESSION: Nondisplaced fracture posterior greater trochanter.  No other acute abnormality.   Original Report Authenticated By: Holley Dexter, M.D.      No diagnosis found.    MDM  CT scan of left hip reveal a nondisplaced fracture of the posterior greater trochanter. Discussed with Dr. Hilda Lias. Outpatient management. Rx tramadol # 30 and follow up visit with orthopedics   I personally performed the services described in this documentation, which was scribed in my presence. The recorded information has been reviewed and is accurate.       Donnetta Hutching, MD 11/13/12 1357

## 2012-11-18 ENCOUNTER — Telehealth: Payer: Self-pay | Admitting: Family Medicine

## 2012-11-18 DIAGNOSIS — J441 Chronic obstructive pulmonary disease with (acute) exacerbation: Secondary | ICD-10-CM

## 2012-11-18 DIAGNOSIS — Z9989 Dependence on other enabling machines and devices: Secondary | ICD-10-CM

## 2012-11-18 DIAGNOSIS — G4733 Obstructive sleep apnea (adult) (pediatric): Secondary | ICD-10-CM

## 2012-11-18 DIAGNOSIS — J449 Chronic obstructive pulmonary disease, unspecified: Secondary | ICD-10-CM

## 2012-11-18 MED ORDER — LEVOFLOXACIN 750 MG PO TABS: 750.0000 mg | ORAL_TABLET | Freq: Every day | ORAL | Status: AC

## 2012-11-18 MED ORDER — PREDNISONE 10 MG PO TABS: ORAL_TABLET | ORAL | Status: DC

## 2012-11-18 NOTE — Telephone Encounter (Signed)
Hospital bed.

## 2012-11-18 NOTE — Telephone Encounter (Signed)
Patient's daughter called since discharge from hospital she has had increased wheezing cough with yellow production. New diagnosis of COPD. They have not been able to get nebulizers well. She was recently hospitalized and also has a small fracture of the hip which is being treated as outpatient I will go ahead and place her on Levaquin 750 once a day she will also be given steroids, and I spoke with the durable medical equipment person and her nebulizer machine is to be sent out to the home.

## 2012-11-18 NOTE — Telephone Encounter (Signed)
Please clarify message, and check on her COPD See if she is having difficulty getting out of bed or what the problem is.  I just spoke with them on Saturday

## 2012-11-19 ENCOUNTER — Encounter: Payer: Self-pay | Admitting: Family Medicine

## 2012-11-19 DIAGNOSIS — R531 Weakness: Secondary | ICD-10-CM | POA: Insufficient documentation

## 2012-11-19 NOTE — Telephone Encounter (Addendum)
Please let her know I will have her see Dr. Juanetta Gosling, who saw her in the hospital- the Lung doctor, he also sees pt with COPD  Please clarify the message below- what did she want sent in to [facility]>??

## 2012-11-19 NOTE — Telephone Encounter (Signed)
Order written fax to [facility]

## 2012-11-19 NOTE — Telephone Encounter (Signed)
i spoke to Kristin Logan and her only question was who was going to be following her on the cpap machine. She saw a Dr in [place] and that is where she initially got the machine but she doesn't go to him anymore. Does she need referral to someone here?

## 2012-11-19 NOTE — Telephone Encounter (Signed)
Said she wanted to see about getting a hospital bed with a trapeze bar so her mom can pull herself up because she has a hard time pulling on her to get her pants up etc and sitting her up in the bed.

## 2012-11-22 ENCOUNTER — Telehealth: Payer: Self-pay | Admitting: Family Medicine

## 2012-11-22 NOTE — Telephone Encounter (Signed)
rx sent to [facility] as requested.

## 2012-11-23 ENCOUNTER — Other Ambulatory Visit: Payer: Self-pay | Admitting: Family Medicine

## 2012-11-26 NOTE — Progress Notes (Addendum)
Patient ID: Kristin Logan, female   DOB: May 21, 1932, 79 y.o.   MRN: [id number] Patient was admitted to [facility] secondary to COPD exacerbation and fall in Feb 2014. This resulted in a fracture of her left hip. She requires a hospital bed to help with positioning with her hip as well as weakness. Hospital bed will also allow for patient to be placed in different positions to decrease formation of decubitus ulcers.  This is addendum since pt last seen

## 2012-11-27 NOTE — Telephone Encounter (Signed)
Daughter is aware 

## 2012-11-28 ENCOUNTER — Ambulatory Visit (HOSPITAL_COMMUNITY)
Admission: RE | Admit: 2012-11-28 | Discharge: 2012-11-28 | Disposition: A | Discharge status: Home / Self Care | Payer: Medicare Other | Source: Ambulatory Visit | Attending: Family Medicine | Admitting: Family Medicine

## 2012-11-28 DIAGNOSIS — R0602 Shortness of breath: Secondary | ICD-10-CM | POA: Insufficient documentation

## 2012-11-28 DIAGNOSIS — J40 Bronchitis, not specified as acute or chronic: Secondary | ICD-10-CM

## 2012-11-28 DIAGNOSIS — R062 Wheezing: Secondary | ICD-10-CM | POA: Insufficient documentation

## 2012-11-29 ENCOUNTER — Other Ambulatory Visit: Payer: Self-pay | Admitting: Family Medicine

## 2012-12-02 ENCOUNTER — Other Ambulatory Visit: Payer: Self-pay | Admitting: Cardiology

## 2012-12-02 MED ORDER — WARFARIN SODIUM 5 MG PO TABS: 5.0000 mg | ORAL_TABLET | Freq: Every day | ORAL | Status: DC

## 2012-12-03 ENCOUNTER — Ambulatory Visit: Payer: Medicare Other | Admitting: Family Medicine

## 2012-12-09 ENCOUNTER — Ambulatory Visit (INDEPENDENT_AMBULATORY_CARE_PROVIDER_SITE_OTHER): Payer: Medicare Other | Admitting: *Deleted

## 2012-12-09 DIAGNOSIS — I482 Chronic atrial fibrillation, unspecified: Secondary | ICD-10-CM

## 2012-12-09 DIAGNOSIS — I4891 Unspecified atrial fibrillation: Secondary | ICD-10-CM

## 2012-12-09 DIAGNOSIS — Z7901 Long term (current) use of anticoagulants: Secondary | ICD-10-CM

## 2012-12-10 ENCOUNTER — Ambulatory Visit (INDEPENDENT_AMBULATORY_CARE_PROVIDER_SITE_OTHER): Payer: Medicare Other | Admitting: Family Medicine

## 2012-12-10 ENCOUNTER — Encounter: Payer: Self-pay | Admitting: Family Medicine

## 2012-12-10 VITALS — BP 108/60 | HR 70 | Resp 18 | Ht 65.5 in | Wt 220.0 lb

## 2012-12-10 DIAGNOSIS — F32A Depression, unspecified: Secondary | ICD-10-CM

## 2012-12-10 DIAGNOSIS — E876 Hypokalemia: Secondary | ICD-10-CM

## 2012-12-10 DIAGNOSIS — F329 Major depressive disorder, single episode, unspecified: Secondary | ICD-10-CM

## 2012-12-10 DIAGNOSIS — I10 Essential (primary) hypertension: Secondary | ICD-10-CM

## 2012-12-10 DIAGNOSIS — E119 Type 2 diabetes mellitus without complications: Secondary | ICD-10-CM

## 2012-12-10 DIAGNOSIS — F3289 Other specified depressive episodes: Secondary | ICD-10-CM

## 2012-12-10 DIAGNOSIS — S72002D Fracture of unspecified part of neck of left femur, subsequent encounter for closed fracture with routine healing: Secondary | ICD-10-CM

## 2012-12-10 DIAGNOSIS — E871 Hypo-osmolality and hyponatremia: Secondary | ICD-10-CM

## 2012-12-10 DIAGNOSIS — S72009D Fracture of unspecified part of neck of unspecified femur, subsequent encounter for closed fracture with routine healing: Secondary | ICD-10-CM

## 2012-12-10 LAB — CBC
MCV: 81.6 fL (ref 78.0–100.0)
Platelets: 303 10*3/uL (ref 150–400)
RBC: 4.41 MIL/uL (ref 3.87–5.11)
RDW: 14.8 % (ref 11.5–15.5)
WBC: 8.2 10*3/uL (ref 4.0–10.5)

## 2012-12-10 LAB — PULMONARY FUNCTION TEST

## 2012-12-10 MED ORDER — HYDROCODONE-ACETAMINOPHEN 5-325 MG PO TABS: 1.0000 | ORAL_TABLET | Freq: Four times a day (QID) | ORAL | Status: DC | PRN

## 2012-12-10 NOTE — Procedures (Signed)
Kristin Logan, Kristin Logan NO.:  [id number]  MEDICAL RECORD NO.:  [id number]  LOCATION:                                 FACILITY:  PHYSICIAN:  Edward L. Juanetta Gosling, M.D.DATE OF BIRTH:  June 26, 1932  DATE OF PROCEDURE:  12/09/2012 DATE OF DISCHARGE:                           PULMONARY FUNCTION TEST   REASON FOR PULMONARY FUNCTION TESTING: 1. Bronchitis, spirometry shows no ventilatory defect and no evidence     of airflow obstruction. 2. Lung volumes are normal. 3. DLCO is moderately reduced, but correct when volume is accounted     for.  Please note there is a mouth air pressure noted. 4. Airway resistance is slightly high. 5. This pulmonary function test does not show a definite cause for     wheezing.     Edward L. Juanetta Gosling, M.D.     ELH/MEDQ  D:  12/09/2012  T:  12/10/2012  Job:  [id number]  cc:   [name]

## 2012-12-10 NOTE — Patient Instructions (Signed)
Get the labs done today  Pain pills refilled  For now continue 1/2 tablet of celexa Reschedule lung doctor appointment New orthopedic appt with Dr. Romeo Apple F/U 2 months

## 2012-12-11 LAB — BASIC METABOLIC PANEL
BUN: 8 mg/dL (ref 6–23)
Calcium: 9.2 mg/dL (ref 8.4–10.5)
Creat: 0.55 mg/dL (ref 0.50–1.10)
Glucose, Bld: 85 mg/dL (ref 70–99)

## 2012-12-12 ENCOUNTER — Encounter: Payer: Self-pay | Admitting: Family Medicine

## 2012-12-12 DIAGNOSIS — S72002A Fracture of unspecified part of neck of left femur, initial encounter for closed fracture: Secondary | ICD-10-CM

## 2012-12-12 HISTORY — DX: Fracture of unspecified part of neck of left femur, initial encounter for closed fracture: S72.002A

## 2012-12-12 NOTE — Assessment & Plan Note (Signed)
Her mood has deteriorated some, however this may not be due to the decrease in dose but rather the hip fracture and pain Per above, consider wellbutrin 37.5mg  BID starting dose

## 2012-12-12 NOTE — Progress Notes (Signed)
  Subjective:    Patient ID: Kristin Logan, female    DOB: Jul 17, 1932, 79 y.o.   MRN: [id number]  HPI  Pt here to f/u hospital admission after near syncope/fall, found to be hyponatremic, water restriction and reduction in celexa made, Na 130 at discharge. She returned to ER 24 hours later found to have left hip fracture non displaced, she wants to be seen by a different orthopedic surgeon as she had a bad experience with the one she has an appt with.  Appt for pulmonary rescheduled for her OSA and chronic bronchitis/wheeze. Breathing has been okay continues to have cough on and off. Needs hospital bed to help her position due to hip fracture and fatigue  Review of Systems  GEN- denies fatigue, fever, weight loss,weakness, recent illness HEENT- denies eye drainage, change in vision, nasal discharge, CVS- denies chest pain, palpitations RESP- denies SOB, +cough, wheeze ABD- denies N/V, change in stools, abd pain GU- denies dysuria, hematuria, dribbling, incontinence MSK- + joint pain, muscle aches, injury Neuro- denies headache, dizziness, syncope, seizure activity      Objective:   Physical Exam GEN- NAD, obese, wheelchair, alert and oriented x 3 , HEENT-PEERL, EOMI , oropharynx clear,  CVS- irregular rhythem, normal rate  2/6 SEM RESP-No wheeze, CTAB  ABD-NABS,soft,NT,ND Ext- no swelling,Left ext- pain with extension of knee or weight bearing left side Pulse- Radial 2+, DP 2+        Assessment & Plan:

## 2012-12-12 NOTE — Assessment & Plan Note (Signed)
A1C looks good, but she has required quite a lot of steroids running sugar up, will keep for now as low dose

## 2012-12-12 NOTE — Assessment & Plan Note (Signed)
Thought to be secondary to water intake , ? Celexa, Repeat labs show Na level of 129  Her previous baseline NA levels were 132-137 She is restricting fluids I will repeat a level in 2 weeks, if still dropping, will try d/c celexa and starting wellbutrin for depression

## 2012-12-12 NOTE — Assessment & Plan Note (Signed)
Increase vicodin to BID dosing, refer to orthopedics,  Hospital bed will aide in positioning to decrease formation of bed sores and skin breakdown, will also assist with transferring. She has a hoover-round and wants to see if she can get this functioning before renting a wheelchair

## 2012-12-25 ENCOUNTER — Ambulatory Visit (INDEPENDENT_AMBULATORY_CARE_PROVIDER_SITE_OTHER): Payer: Medicare Other | Admitting: Orthopedic Surgery

## 2012-12-25 ENCOUNTER — Ambulatory Visit (INDEPENDENT_AMBULATORY_CARE_PROVIDER_SITE_OTHER): Payer: Medicare Other

## 2012-12-25 VITALS — BP 160/78 | Ht 65.5 in | Wt 220.0 lb

## 2012-12-25 DIAGNOSIS — M1710 Unilateral primary osteoarthritis, unspecified knee: Secondary | ICD-10-CM

## 2012-12-25 DIAGNOSIS — M25439 Effusion, unspecified wrist: Secondary | ICD-10-CM

## 2012-12-25 DIAGNOSIS — M25432 Effusion, left wrist: Secondary | ICD-10-CM

## 2012-12-25 DIAGNOSIS — M170 Bilateral primary osteoarthritis of knee: Secondary | ICD-10-CM

## 2012-12-25 DIAGNOSIS — S72109A Unspecified trochanteric fracture of unspecified femur, initial encounter for closed fracture: Secondary | ICD-10-CM

## 2012-12-25 DIAGNOSIS — IMO0002 Reserved for concepts with insufficient information to code with codable children: Secondary | ICD-10-CM

## 2012-12-25 DIAGNOSIS — S72112A Displaced fracture of greater trochanter of left femur, initial encounter for closed fracture: Secondary | ICD-10-CM

## 2012-12-25 MED ORDER — PREDNISONE 10 MG PO TABS: 10.0000 mg | ORAL_TABLET | Freq: Two times a day (BID) | ORAL | Status: DC

## 2012-12-25 NOTE — Patient Instructions (Signed)
Brace x 7 days   Prednisone x 7 days

## 2012-12-26 ENCOUNTER — Encounter: Payer: Self-pay | Admitting: Orthopedic Surgery

## 2012-12-26 DIAGNOSIS — M25439 Effusion, unspecified wrist: Secondary | ICD-10-CM | POA: Insufficient documentation

## 2012-12-26 DIAGNOSIS — M170 Bilateral primary osteoarthritis of knee: Secondary | ICD-10-CM | POA: Insufficient documentation

## 2012-12-26 NOTE — Progress Notes (Addendum)
Patient ID: Kristin Logan, female   DOB: 1931/10/19, 79 y.o.   MRN: [id number] Chief Complaint  Patient presents with  . Hip Injury    Left hip fracture.    Marland Kitchen History 79 year old female who fractured her left greater trochanter in February was treated with skillful neglected seems like she did go to a rehabilitation center she went back home and sat in the bed with some physical therapy assistance which is spotty at best and has now really regained most of her pre-injury knee function and leg function which included basically ambulatory with a walker in the house  However she now presents with swelling of the left wrist and desire to have her knees replaced. Her daughter is with her. She indicates that she's been receiving cortisone injections for several years was in line to have a knee replacement on both sides and then had a heart attack  Her primary problem today however is acute swelling of the left wrist which started last yesterday and is associated with warmth swelling and erythema and decreased range of motion without any history of trauma pain is quite severe patient reports that she's been using her arms more to help support her weight.  Again she did not have an adequate course of physical therapy he has gotten weak and requires more upper extremity use to get out of a chair to stand up  Reports no current chills or fatigue she does complain of chest pain or shortness of breath with wheezing and cough depression is noted easy bleeding easy bruising excessive thirst temperature intolerance to cold she is  She's had a valve replacement and a lumpectomy  Family physician [name] [facility]  BP 160/78  Ht 5' 5.5" (1.664 m)  Wt 220 lb (99.791 kg)  BMI 36.04 kg/m2 General appearance is normal, the patient is alert and oriented x3 with normal mood and affect. Ambulation again she can't really stand up without significant help she is basically bed to chair with  occasional use of a walker to go bed to chair and bathroom  His bilateral knee flexion contractures and flexion limited to 105 mainly at this point secondary to chronic wheelchair use and not ambulating. Her joints are stable at the knee motor functions intact skin is normal  Has no peripheral edema sensations intact  Her left wrist is tender and swollen is red the thumb area is involved the dorsum of the hand is involved is no skin rash motor exam is normal the wrist and thumb appear stable  The opposite side shows prominence at the [facility] joint but otherwise range of motion stability strength tests normal skin intact  X-ray of the wrist does not show any acute abnormality there is osteopenia and arthritic changes in the hand  The previously noted greater trochanter fracture those x-rays are noted along with a CT scan as a nondisplaced to minimally displaced fracture of the should not and did not require any treatment  Plan splint and steroids to address the wrist area for fusion and pain which I believe is secondary to overuse caused by the lack of physical therapy  She will return in a week for me to reevaluate that  If she ever recovers from this and wishes to have knee surgery she would not be a candidate with me but could be seen at a tertiary care facility for a consultation regarding possible replacement surgery if she wants to take those risks  Wrist effusion, left - Plan:  predniSONE (DELTASONE) 10 MG tablet  Wrist swelling, left - Plan: DG Wrist 2 Views Left, predniSONE (DELTASONE) 10 MG tablet  Osteoarthritis of both knees  Greater trochanter fracture, left, closed, initial encounter

## 2012-12-27 ENCOUNTER — Encounter: Payer: Self-pay | Admitting: Family Medicine

## 2012-12-27 ENCOUNTER — Ambulatory Visit (INDEPENDENT_AMBULATORY_CARE_PROVIDER_SITE_OTHER): Payer: Medicare Other | Admitting: Family Medicine

## 2012-12-27 VITALS — BP 140/74 | HR 64 | Resp 18 | Ht 65.5 in | Wt 211.0 lb

## 2012-12-27 DIAGNOSIS — R269 Unspecified abnormalities of gait and mobility: Secondary | ICD-10-CM

## 2012-12-27 DIAGNOSIS — M25432 Effusion, left wrist: Secondary | ICD-10-CM

## 2012-12-27 DIAGNOSIS — IMO0002 Reserved for concepts with insufficient information to code with codable children: Secondary | ICD-10-CM

## 2012-12-27 DIAGNOSIS — S72009D Fracture of unspecified part of neck of unspecified femur, subsequent encounter for closed fracture with routine healing: Secondary | ICD-10-CM

## 2012-12-27 DIAGNOSIS — S72002D Fracture of unspecified part of neck of left femur, subsequent encounter for closed fracture with routine healing: Secondary | ICD-10-CM

## 2012-12-27 DIAGNOSIS — R2681 Unsteadiness on feet: Secondary | ICD-10-CM

## 2012-12-27 DIAGNOSIS — M25439 Effusion, unspecified wrist: Secondary | ICD-10-CM

## 2012-12-27 DIAGNOSIS — E871 Hypo-osmolality and hyponatremia: Secondary | ICD-10-CM

## 2012-12-27 DIAGNOSIS — M179 Osteoarthritis of knee, unspecified: Secondary | ICD-10-CM

## 2012-12-27 DIAGNOSIS — M1710 Unilateral primary osteoarthritis, unspecified knee: Secondary | ICD-10-CM

## 2012-12-27 NOTE — Patient Instructions (Addendum)
I will send off the [facility] form Get the blood drawn today  Continue current medications Change f/u 3 months

## 2012-12-28 LAB — BASIC METABOLIC PANEL
CO2: 25 mEq/L (ref 19–32)
Calcium: 10 mg/dL (ref 8.4–10.5)
Chloride: 95 mEq/L — ABNORMAL LOW (ref 96–112)
Creat: 0.57 mg/dL (ref 0.50–1.10)
Sodium: 130 mEq/L — ABNORMAL LOW (ref 135–145)

## 2012-12-29 ENCOUNTER — Encounter: Payer: Self-pay | Admitting: Family Medicine

## 2012-12-29 NOTE — Assessment & Plan Note (Signed)
No surgical intervention, continue pain control

## 2012-12-29 NOTE — Assessment & Plan Note (Signed)
No surgical intervention at this time due to health

## 2012-12-29 NOTE — Assessment & Plan Note (Signed)
Increased instability, see above

## 2012-12-29 NOTE — Progress Notes (Addendum)
  Subjective:    Patient ID: Kristin Logan, female    DOB: [birth year], 79 y.o.   MRN: [id number]  HPI  Pt here for [facility] evaluation. She needs help getting around home and outside of home. Able to stand to move around some but wears out easily and joints cause too much pain. Being treated conservatively for Left hip fracture, Found to have effusion of left wrist due to increased strain with use of walker and weight bearing.  She requires help with dressing, bathing- getting into and out of the tub/shower, and grooming her hair. Able to feed and toilet herself Patient is having more difficulty with her walker and feels unsteady with its use. History of falls due to unsteady gait and poor balance even with walker  Review of Systems    GEN- denies fatigue, fever, weight loss,weakness, recent illness HEENT- denies eye drainage, change in vision, nasal discharge, CVS- denies chest pain, palpitations RESP- denies SOB, cough, wheeze ABD- denies N/V, change in stools, abd pain GU- denies dysuria, hematuria, dribbling, incontinence MSK-+ joint pain, muscle aches, injury Neuro- denies headache, dizziness, syncope, seizure activity      Objective:   Physical Exam GEN- NAD, obese, , alert and oriented x 3 , HEENT-PEERL, EOMI , oropharynx clear,  CVS- irregular rhythem, normal rate  2/6 SEM RESP-No wheeze, CTAB  ABD-NABS,soft,NT,ND Ext- no swelling Pulse- Radial 2+, DP 2+ Neuro- CNII-XII in tact, DTR symmetic, good tone, Strength 4/5 LLE, 4+/5 RLE, Left wrist- fair ROM,unsteady gait/balance, grasp decreased Left hand compared to right  MSK- decresed ROM HIPs, Upper ext, Knees       Assessment & Plan:    Mobility Exam   Gait instablility/poor balance- pt requires power wheelchair to help improve her mobility and daily functioning. She was using power wheelchair in past and benefited from its use.  SHe continues at a level where power wheelchair would provide, assistance to help  with ADL's such as dressing, transitioning to dining area and toileting.  She is physically and mentally able to use power wheelchair.  She is motivated and willing to use scooter.  Wheelchair will help improve her ability to get from her bedroom to bathroom and into dining area.  Wheelchair will also assist in getting to doctors appointments.  Power wheelchair is recommended for my patient instead of manual wheelchair/scooter as she has decreased ROM in upper extrememties and grasp in left hand. Per above, she has used power wheelchair in the past  Pt can not use walker secondary to history of falls and poor balance

## 2012-12-29 NOTE — Assessment & Plan Note (Signed)
On Prednisone, improving followed by ortho

## 2012-12-30 ENCOUNTER — Ambulatory Visit (INDEPENDENT_AMBULATORY_CARE_PROVIDER_SITE_OTHER): Payer: Medicare Other | Admitting: *Deleted

## 2012-12-30 DIAGNOSIS — I482 Chronic atrial fibrillation, unspecified: Secondary | ICD-10-CM

## 2012-12-30 DIAGNOSIS — I4891 Unspecified atrial fibrillation: Secondary | ICD-10-CM

## 2012-12-30 DIAGNOSIS — Z7901 Long term (current) use of anticoagulants: Secondary | ICD-10-CM

## 2012-12-31 ENCOUNTER — Ambulatory Visit (INDEPENDENT_AMBULATORY_CARE_PROVIDER_SITE_OTHER): Payer: Medicare Other | Admitting: Orthopedic Surgery

## 2012-12-31 VITALS — BP 122/68 | Ht 65.5 in | Wt 220.0 lb

## 2012-12-31 DIAGNOSIS — M1710 Unilateral primary osteoarthritis, unspecified knee: Secondary | ICD-10-CM

## 2012-12-31 NOTE — Progress Notes (Signed)
Patient ID: Kristin Logan, female   DOB: [birth year], 79 y.o.   MRN: [id number] Chief Complaint  Patient presents with  . Follow-up    left wrist effusion    BP 122/68  Ht 5' 5.5" (1.664 m)  Wt 220 lb (99.791 kg)  BMI 36.04 kg/m2  For repeat evaluation status post splint and steroids for wrist effusion  Pain has been controlled  Still complains of knee pain would like to have evaluation  I discussed this with her we will set up a time to have her come back for x-rays of both knees and evaluation for surgical versus nonoperative treatment

## 2012-12-31 NOTE — Patient Instructions (Addendum)
Schedule for xrays both knees knee evaluation   HANDICAP FORM

## 2013-01-01 ENCOUNTER — Telehealth: Payer: Self-pay | Admitting: Family Medicine

## 2013-01-01 DIAGNOSIS — M25432 Effusion, left wrist: Secondary | ICD-10-CM

## 2013-01-01 DIAGNOSIS — M170 Bilateral primary osteoarthritis of knee: Secondary | ICD-10-CM

## 2013-01-01 NOTE — Telephone Encounter (Signed)
Mutual) Patient has been initially referred to Dr. Romeo Apple for hip problem and surgery, which had been done. Next referral was for wrist, which she was seen for today, 12/31/12. Patient next is requesting to be seen for bilateral knees. This was discussed in the exam room with Dr. Romeo Apple and he would like to evaluate and Xray her knees. Please advise regarding referral for knees. I've scheduled the appointment 01/30/13 but can re-schedule if necessary.  Please put in the work que for this referral

## 2013-01-01 NOTE — Telephone Encounter (Signed)
Referral placed.

## 2013-01-02 ENCOUNTER — Telehealth: Payer: Self-pay | Admitting: Family Medicine

## 2013-01-02 NOTE — Telephone Encounter (Signed)
Her antidepressant was decreased when she was in the hospital and she has been crying a lot and feeling depressed. She was wanting to know if it can be increased again or changed to something else.  Also wanting a handicapped sticker

## 2013-01-03 ENCOUNTER — Other Ambulatory Visit: Payer: Self-pay | Admitting: Family Medicine

## 2013-01-03 MED ORDER — BUPROPION HCL 75 MG PO TABS: 37.5000 mg | ORAL_TABLET | Freq: Two times a day (BID) | ORAL | Status: DC

## 2013-01-03 NOTE — Telephone Encounter (Signed)
Patient aware. Daughter to schedule and will collect sticker

## 2013-01-03 NOTE — Telephone Encounter (Signed)
I will keep her celexa at the current dose, she will start wellbutrin, this will be taken along with the celexa to help her depression.   Handicap sticker okay  Have her schedule a visit to come in to see me in 6 weeks to see how her mood is doing if she does have appt

## 2013-01-08 ENCOUNTER — Other Ambulatory Visit: Payer: Self-pay | Admitting: Family Medicine

## 2013-01-08 ENCOUNTER — Other Ambulatory Visit: Payer: Self-pay

## 2013-01-08 ENCOUNTER — Telehealth: Payer: Self-pay | Admitting: Family Medicine

## 2013-01-08 ENCOUNTER — Telehealth: Payer: Self-pay

## 2013-01-08 MED ORDER — PROMETHAZINE HCL 12.5 MG PO TABS: ORAL_TABLET | ORAL | Status: DC

## 2013-01-08 NOTE — Telephone Encounter (Signed)
Daughter aware rx and will collect from pharmacy.

## 2013-01-08 NOTE — Telephone Encounter (Signed)
Spoke to daughter.

## 2013-01-08 NOTE — Telephone Encounter (Signed)
Seven tabs entered pls advise daulghter and send after you spk with her. If pt worsens may need ED visit or close f/u with PCP, let her know pls

## 2013-01-09 ENCOUNTER — Ambulatory Visit (INDEPENDENT_AMBULATORY_CARE_PROVIDER_SITE_OTHER): Payer: Medicare Other | Admitting: *Deleted

## 2013-01-09 DIAGNOSIS — I482 Chronic atrial fibrillation, unspecified: Secondary | ICD-10-CM

## 2013-01-09 DIAGNOSIS — I4891 Unspecified atrial fibrillation: Secondary | ICD-10-CM

## 2013-01-09 DIAGNOSIS — Z7901 Long term (current) use of anticoagulants: Secondary | ICD-10-CM

## 2013-01-09 LAB — POCT INR: INR: 4.9

## 2013-01-13 ENCOUNTER — Telehealth: Payer: Self-pay | Admitting: Family Medicine

## 2013-01-13 ENCOUNTER — Ambulatory Visit: Payer: Medicare Other | Admitting: Family Medicine

## 2013-01-14 ENCOUNTER — Telehealth: Payer: Self-pay | Admitting: Family Medicine

## 2013-01-14 ENCOUNTER — Emergency Department (HOSPITAL_COMMUNITY): Payer: Medicare Other

## 2013-01-14 ENCOUNTER — Other Ambulatory Visit: Payer: Self-pay

## 2013-01-14 ENCOUNTER — Inpatient Hospital Stay (HOSPITAL_COMMUNITY)
Admission: EM | Admit: 2013-01-14 | Discharge: 2013-01-21 | DRG: 643 | Disposition: A | Discharge status: Home Health | Payer: Medicare Other | Attending: Family Medicine | Admitting: Family Medicine

## 2013-01-14 ENCOUNTER — Inpatient Hospital Stay (HOSPITAL_COMMUNITY): Payer: Medicare Other

## 2013-01-14 ENCOUNTER — Encounter (HOSPITAL_COMMUNITY): Payer: Self-pay | Admitting: *Deleted

## 2013-01-14 DIAGNOSIS — Z885 Allergy status to narcotic agent status: Secondary | ICD-10-CM

## 2013-01-14 DIAGNOSIS — E119 Type 2 diabetes mellitus without complications: Secondary | ICD-10-CM | POA: Diagnosis present

## 2013-01-14 DIAGNOSIS — K219 Gastro-esophageal reflux disease without esophagitis: Secondary | ICD-10-CM | POA: Diagnosis present

## 2013-01-14 DIAGNOSIS — M170 Bilateral primary osteoarthritis of knee: Secondary | ICD-10-CM | POA: Diagnosis present

## 2013-01-14 DIAGNOSIS — F329 Major depressive disorder, single episode, unspecified: Secondary | ICD-10-CM | POA: Diagnosis present

## 2013-01-14 DIAGNOSIS — I10 Essential (primary) hypertension: Secondary | ICD-10-CM | POA: Diagnosis present

## 2013-01-14 DIAGNOSIS — L989 Disorder of the skin and subcutaneous tissue, unspecified: Secondary | ICD-10-CM | POA: Diagnosis present

## 2013-01-14 DIAGNOSIS — Z823 Family history of stroke: Secondary | ICD-10-CM

## 2013-01-14 DIAGNOSIS — M62838 Other muscle spasm: Secondary | ICD-10-CM | POA: Diagnosis present

## 2013-01-14 DIAGNOSIS — R531 Weakness: Secondary | ICD-10-CM | POA: Diagnosis present

## 2013-01-14 DIAGNOSIS — Z886 Allergy status to analgesic agent status: Secondary | ICD-10-CM

## 2013-01-14 DIAGNOSIS — E871 Hypo-osmolality and hyponatremia: Secondary | ICD-10-CM | POA: Diagnosis present

## 2013-01-14 DIAGNOSIS — D721 Eosinophilia, unspecified: Secondary | ICD-10-CM | POA: Diagnosis present

## 2013-01-14 DIAGNOSIS — R110 Nausea: Secondary | ICD-10-CM

## 2013-01-14 DIAGNOSIS — Z79899 Other long term (current) drug therapy: Secondary | ICD-10-CM

## 2013-01-14 DIAGNOSIS — E236 Other disorders of pituitary gland: Principal | ICD-10-CM | POA: Diagnosis present

## 2013-01-14 DIAGNOSIS — G589 Mononeuropathy, unspecified: Secondary | ICD-10-CM | POA: Diagnosis present

## 2013-01-14 DIAGNOSIS — F3289 Other specified depressive episodes: Secondary | ICD-10-CM | POA: Diagnosis present

## 2013-01-14 DIAGNOSIS — Z7901 Long term (current) use of anticoagulants: Secondary | ICD-10-CM

## 2013-01-14 DIAGNOSIS — Z809 Family history of malignant neoplasm, unspecified: Secondary | ICD-10-CM

## 2013-01-14 DIAGNOSIS — Z853 Personal history of malignant neoplasm of breast: Secondary | ICD-10-CM

## 2013-01-14 DIAGNOSIS — G8929 Other chronic pain: Secondary | ICD-10-CM | POA: Diagnosis present

## 2013-01-14 DIAGNOSIS — I4891 Unspecified atrial fibrillation: Secondary | ICD-10-CM

## 2013-01-14 DIAGNOSIS — G9340 Encephalopathy, unspecified: Secondary | ICD-10-CM | POA: Diagnosis present

## 2013-01-14 DIAGNOSIS — I482 Chronic atrial fibrillation, unspecified: Secondary | ICD-10-CM

## 2013-01-14 DIAGNOSIS — I517 Cardiomegaly: Secondary | ICD-10-CM | POA: Diagnosis present

## 2013-01-14 DIAGNOSIS — Z8679 Personal history of other diseases of the circulatory system: Secondary | ICD-10-CM

## 2013-01-14 DIAGNOSIS — H9190 Unspecified hearing loss, unspecified ear: Secondary | ICD-10-CM | POA: Diagnosis present

## 2013-01-14 DIAGNOSIS — R300 Dysuria: Secondary | ICD-10-CM | POA: Diagnosis present

## 2013-01-14 DIAGNOSIS — Z923 Personal history of irradiation: Secondary | ICD-10-CM

## 2013-01-14 DIAGNOSIS — R05 Cough: Secondary | ICD-10-CM | POA: Diagnosis present

## 2013-01-14 DIAGNOSIS — Z87820 Personal history of traumatic brain injury: Secondary | ICD-10-CM

## 2013-01-14 DIAGNOSIS — IMO0002 Reserved for concepts with insufficient information to code with codable children: Secondary | ICD-10-CM | POA: Diagnosis present

## 2013-01-14 DIAGNOSIS — G4730 Sleep apnea, unspecified: Secondary | ICD-10-CM | POA: Diagnosis present

## 2013-01-14 DIAGNOSIS — Z8249 Family history of ischemic heart disease and other diseases of the circulatory system: Secondary | ICD-10-CM

## 2013-01-14 DIAGNOSIS — I671 Cerebral aneurysm, nonruptured: Secondary | ICD-10-CM | POA: Diagnosis present

## 2013-01-14 DIAGNOSIS — E039 Hypothyroidism, unspecified: Secondary | ICD-10-CM | POA: Diagnosis present

## 2013-01-14 DIAGNOSIS — N3289 Other specified disorders of bladder: Secondary | ICD-10-CM | POA: Diagnosis present

## 2013-01-14 DIAGNOSIS — R2681 Unsteadiness on feet: Secondary | ICD-10-CM | POA: Diagnosis present

## 2013-01-14 DIAGNOSIS — R634 Abnormal weight loss: Secondary | ICD-10-CM | POA: Diagnosis present

## 2013-01-14 DIAGNOSIS — J42 Unspecified chronic bronchitis: Secondary | ICD-10-CM | POA: Diagnosis present

## 2013-01-14 DIAGNOSIS — E785 Hyperlipidemia, unspecified: Secondary | ICD-10-CM | POA: Diagnosis present

## 2013-01-14 DIAGNOSIS — Z954 Presence of other heart-valve replacement: Secondary | ICD-10-CM

## 2013-01-14 DIAGNOSIS — Z9181 History of falling: Secondary | ICD-10-CM

## 2013-01-14 DIAGNOSIS — G4733 Obstructive sleep apnea (adult) (pediatric): Secondary | ICD-10-CM | POA: Diagnosis present

## 2013-01-14 DIAGNOSIS — R269 Unspecified abnormalities of gait and mobility: Secondary | ICD-10-CM | POA: Diagnosis present

## 2013-01-14 DIAGNOSIS — R1110 Vomiting, unspecified: Secondary | ICD-10-CM

## 2013-01-14 DIAGNOSIS — N952 Postmenopausal atrophic vaginitis: Secondary | ICD-10-CM | POA: Diagnosis present

## 2013-01-14 DIAGNOSIS — Z7401 Bed confinement status: Secondary | ICD-10-CM

## 2013-01-14 DIAGNOSIS — K429 Umbilical hernia without obstruction or gangrene: Secondary | ICD-10-CM | POA: Diagnosis present

## 2013-01-14 DIAGNOSIS — I359 Nonrheumatic aortic valve disorder, unspecified: Secondary | ICD-10-CM | POA: Diagnosis present

## 2013-01-14 DIAGNOSIS — Z9010 Acquired absence of unspecified breast and nipple: Secondary | ICD-10-CM

## 2013-01-14 HISTORY — DX: Personal history of malignant neoplasm of breast: Z85.3

## 2013-01-14 HISTORY — DX: Unilateral primary osteoarthritis, unspecified knee: M17.10

## 2013-01-14 HISTORY — DX: Unspecified injury of unspecified foot, initial encounter: S99.929A

## 2013-01-14 HISTORY — DX: Fracture of unspecified part of neck of left femur, initial encounter for closed fracture: S72.002A

## 2013-01-14 HISTORY — DX: Syncope and collapse: R55

## 2013-01-14 LAB — COMPREHENSIVE METABOLIC PANEL
ALT: 8 U/L (ref 0–35)
AST: 21 U/L (ref 0–37)
Alkaline Phosphatase: 72 U/L (ref 39–117)
CO2: 24 mEq/L (ref 19–32)
Chloride: 81 mEq/L — ABNORMAL LOW (ref 96–112)
GFR calc Af Amer: >90 mL/min (ref >90)
GFR calc non Af Amer: >90 mL/min (ref >90)
Glucose, Bld: 95 mg/dL (ref 70–99)
Sodium: 116 mEq/L — CL (ref 135–145)
Total Bilirubin: 0.5 mg/dL (ref 0.3–1.2)

## 2013-01-14 LAB — URINALYSIS, ROUTINE W REFLEX MICROSCOPIC
Bilirubin Urine: NEGATIVE
Glucose, UA: NEGATIVE mg/dL
Hgb urine dipstick: NEGATIVE
Ketones, ur: NEGATIVE mg/dL
Protein, ur: NEGATIVE mg/dL
Urobilinogen, UA: 0.2 mg/dL (ref 0.0–1.0)

## 2013-01-14 LAB — CBC WITH DIFFERENTIAL/PLATELET
Basophils Absolute: 0.1 10*3/uL (ref 0.0–0.1)
HCT: 37 % (ref 36.0–46.0)
Lymphocytes Relative: 20 % (ref 12–46)
Lymphs Abs: 1.8 10*3/uL (ref 0.7–4.0)
MCV: 78.2 fL (ref 78.0–100.0)
Neutro Abs: 5.1 10*3/uL (ref 1.7–7.7)
Platelets: 267 10*3/uL (ref 150–400)
RBC: 4.73 MIL/uL (ref 3.87–5.11)
RDW: 14 % (ref 11.5–15.5)
WBC: 8.9 10*3/uL (ref 4.0–10.5)

## 2013-01-14 LAB — BASIC METABOLIC PANEL
BUN: 4 mg/dL — ABNORMAL LOW (ref 6–23)
CO2: 22 mEq/L (ref 19–32)
Chloride: 81 mEq/L — ABNORMAL LOW (ref 96–112)
Creatinine, Ser: 0.55 mg/dL (ref 0.50–1.10)

## 2013-01-14 LAB — TROPONIN I: Troponin I: <0.3 ng/mL (ref <0.30)

## 2013-01-14 LAB — PROTIME-INR: Prothrombin Time: 23.6 seconds — ABNORMAL HIGH (ref 11.6–15.2)

## 2013-01-14 MED ORDER — SIMVASTATIN 20 MG PO TABS
20.0000 mg | ORAL_TABLET | Freq: Every day | ORAL | Status: DC
  Administered 2013-01-14 – 2013-01-20 (×7): 20 mg via ORAL
  Filled 2013-01-14 (×6): qty 1

## 2013-01-14 MED ORDER — METOPROLOL SUCCINATE ER 25 MG PO TB24
25.0000 mg | ORAL_TABLET | Freq: Every day | ORAL | Status: DC
  Administered 2013-01-15 – 2013-01-21 (×7): 25 mg via ORAL
  Filled 2013-01-14 (×7): qty 1

## 2013-01-14 MED ORDER — SODIUM CHLORIDE 0.9 % IV SOLN: INTRAVENOUS | Status: DC

## 2013-01-14 MED ORDER — FUROSEMIDE 10 MG/ML IJ SOLN
40.0000 mg | Freq: Once | INTRAMUSCULAR | Status: AC
  Administered 2013-01-14: 40 mg via INTRAVENOUS

## 2013-01-14 MED ORDER — PANTOPRAZOLE SODIUM 40 MG IV SOLR
40.0000 mg | Freq: Every day | INTRAVENOUS | Status: DC
  Administered 2013-01-14: 40 mg via INTRAVENOUS
  Filled 2013-01-14: qty 40

## 2013-01-14 MED ORDER — PHENAZOPYRIDINE HCL 100 MG PO TABS
200.0000 mg | ORAL_TABLET | Freq: Once | ORAL | Status: AC
  Administered 2013-01-14: 200 mg via ORAL
  Filled 2013-01-14: qty 2

## 2013-01-14 MED ORDER — LIDOCAINE HCL 2 % EX GEL
CUTANEOUS | Status: AC
  Filled 2013-01-14: qty 10

## 2013-01-14 MED ORDER — ALUM & MAG HYDROXIDE-SIMETH 200-200-20 MG/5ML PO SUSP: 30.0000 mL | Freq: Four times a day (QID) | ORAL | Status: DC | PRN

## 2013-01-14 MED ORDER — HYDROCORTISONE 1 % EX OINT
TOPICAL_OINTMENT | Freq: Two times a day (BID) | CUTANEOUS | Status: DC
  Administered 2013-01-15 – 2013-01-21 (×13): via TOPICAL
  Filled 2013-01-14: qty 28.35

## 2013-01-14 MED ORDER — LORAZEPAM 2 MG/ML IJ SOLN
0.2500 mg | INTRAMUSCULAR | Status: DC | PRN
  Administered 2013-01-14 – 2013-01-15 (×3): 0.25 mg via INTRAVENOUS
  Filled 2013-01-14 (×3): qty 1

## 2013-01-14 MED ORDER — BARRIER CREAM NON-SPECIFIED
1.0000 "application " | TOPICAL_CREAM | Freq: Two times a day (BID) | TOPICAL | Status: DC | PRN
  Filled 2013-01-14: qty 1

## 2013-01-14 MED ORDER — HYDROMORPHONE HCL PF 1 MG/ML IJ SOLN
0.5000 mg | Freq: Once | INTRAMUSCULAR | Status: AC
  Administered 2013-01-14: 0.5 mg via INTRAVENOUS
  Filled 2013-01-14: qty 1

## 2013-01-14 MED ORDER — CLOTRIMAZOLE 1 % EX CREA
TOPICAL_CREAM | Freq: Two times a day (BID) | CUTANEOUS | Status: DC
  Administered 2013-01-15 – 2013-01-21 (×13): via TOPICAL
  Filled 2013-01-14: qty 15

## 2013-01-14 MED ORDER — SODIUM CHLORIDE 0.9 % IV SOLN
INTRAVENOUS | Status: DC
  Administered 2013-01-14 (×2): via INTRAVENOUS

## 2013-01-14 MED ORDER — SODIUM CHLORIDE 0.9 % IJ SOLN
3.0000 mL | Freq: Two times a day (BID) | INTRAMUSCULAR | Status: DC
  Administered 2013-01-15 – 2013-01-20 (×10): 3 mL via INTRAVENOUS

## 2013-01-14 MED ORDER — ONDANSETRON HCL 4 MG/2ML IJ SOLN
4.0000 mg | Freq: Four times a day (QID) | INTRAMUSCULAR | Status: DC | PRN
  Administered 2013-01-20: 4 mg via INTRAVENOUS
  Filled 2013-01-14: qty 2

## 2013-01-14 MED ORDER — GLUCOSE BLOOD VI STRP: ORAL_STRIP | Status: DC

## 2013-01-14 MED ORDER — ACETAMINOPHEN 325 MG PO TABS
650.0000 mg | ORAL_TABLET | Freq: Four times a day (QID) | ORAL | Status: DC | PRN
  Administered 2013-01-15 – 2013-01-21 (×10): 650 mg via ORAL
  Filled 2013-01-14 (×11): qty 2

## 2013-01-14 MED ORDER — BISACODYL 10 MG RE SUPP
10.0000 mg | Freq: Every day | RECTAL | Status: DC | PRN
  Administered 2013-01-18: 10 mg via RECTAL
  Filled 2013-01-14: qty 1

## 2013-01-14 MED ORDER — SODIUM CHLORIDE 0.9 % IV BOLUS (SEPSIS)
250.0000 mL | Freq: Once | INTRAVENOUS | Status: AC
  Administered 2013-01-14: 250 mL via INTRAVENOUS

## 2013-01-14 MED ORDER — ALBUTEROL SULFATE (5 MG/ML) 0.5% IN NEBU
2.5000 mg | INHALATION_SOLUTION | RESPIRATORY_TRACT | Status: DC | PRN
  Administered 2013-01-15: 2.5 mg via RESPIRATORY_TRACT
  Filled 2013-01-14: qty 0.5

## 2013-01-14 MED ORDER — ONDANSETRON HCL 4 MG/2ML IJ SOLN
4.0000 mg | Freq: Once | INTRAMUSCULAR | Status: AC
  Administered 2013-01-14: 4 mg via INTRAVENOUS
  Filled 2013-01-14: qty 2

## 2013-01-14 MED ORDER — HYDROMORPHONE HCL PF 1 MG/ML IJ SOLN
0.5000 mg | INTRAMUSCULAR | Status: DC | PRN
  Administered 2013-01-14 – 2013-01-15 (×2): 0.5 mg via INTRAVENOUS
  Filled 2013-01-14 (×2): qty 1

## 2013-01-14 MED ORDER — FUROSEMIDE 10 MG/ML IJ SOLN
INTRAMUSCULAR | Status: AC
  Filled 2013-01-14: qty 4

## 2013-01-14 MED ORDER — IOHEXOL 300 MG/ML  SOLN
100.0000 mL | Freq: Once | INTRAMUSCULAR | Status: AC | PRN
  Administered 2013-01-14: 100 mL via INTRAVENOUS

## 2013-01-14 MED ORDER — IOHEXOL 300 MG/ML  SOLN
50.0000 mL | Freq: Once | INTRAMUSCULAR | Status: AC | PRN
  Administered 2013-01-14: 50 mL via ORAL

## 2013-01-14 MED ORDER — ACETAMINOPHEN 650 MG RE SUPP: 650.0000 mg | Freq: Four times a day (QID) | RECTAL | Status: DC | PRN

## 2013-01-14 MED ORDER — ONDANSETRON HCL 4 MG PO TABS: 4.0000 mg | ORAL_TABLET | Freq: Four times a day (QID) | ORAL | Status: DC | PRN

## 2013-01-14 NOTE — Telephone Encounter (Signed)
Strips sent

## 2013-01-14 NOTE — H&P (Signed)
[name] History and Physical  Kristin Logan ZOX:[id number] DOB: November 05, 1931 DOA: 01/14/2013  Referring physician: Donne Hazel PCP: [name], MD  Specialists: Pulmonary, Juanetta Gosling (prior consult)  Chief Complaint: Nausea, Vomiting, HA, Dizziness  HPI: Kristin Logan is a 79 y.o. female with a PMH significant for Breast Cancer (declared cancer free), A-Fib on coumadin therapy, Hypothyroidism and Valvular heart disease s/p aortic valve replacement at [facility] in 2011 who presents to the ED c/o apx 1 week history of nausea, vomiting, lethargy and gait instability. In the ED she was found to have a critically low sodium of 116. 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  She was initially given IV fluids which improved her Na but over the course of the next day her sodium trended back down and she was then placed on fluid restriction and given lasix which then improved her Na to 130. She was discharged 48 hours later on fluid restriction which according to her family has been religiously maintained, and she has also continued her regular lasix dosing-labs followed by her PCP [name] and have been stable at 130 -last check 12/27/12, which is still fairly low.  The unusual part of her prior history is that at the time of her last admission she had significant wheezing with a completely normal CXR, no history of COPD or smoking or asthma, and marked eosinophilia. She was given high dose of steroids in the ED but those were discontinued pending her hyponatremia work up at that time. She also on the prior had very little urine output, painful/difficult urination and family reports significant peripheral edema prior to fluid restriction. For the breathing issues she underwent subsequent PFTs which showed increased airway resistance  but no focal airway obstruction, no COPD or reactive airway. Additionally she did not an elevated BNP to suggest CHF and her liver was normal at that time.   Other pertinent history is significant weight loss 265 lbs 2 months ago to 211 currently, chronic pain, depression, mental sluggishness, generalized weakness, falls, unstable gait, poor appetite, painful dysuria, difficulty urinating due to pain, severe maceration and desquamation of her perineal area, and dizziness with standing. She also complains of severe lower abdominal "spasms", thigh spasms and epigastric pain. She has a history of multiple courses of oral steroids, antibiotics, and office visits for "bronchitis" and a chronic cough for the past year. Additionally she has chronic pain from her low back DJD, severe knee OA, and pain from prior injuries related to falls at home.  Review of Systems: Per HPI. Patient unable to provide history, obtained from record review/PCP visits and family.  Past Medical History  Diagnosis Date  . Diabetes mellitus   . Hyperlipidemia   . Eosinophilia   . Cataract   . Hypertension   . Atrial fibrillation prior to 2008    moderate biatrial enlargement in 2009; mild to moderate LVH with a low-normal EF  . GERD (gastroesophageal reflux disease)     Hemorrhoids; history of peptic ulcer disease  . DJD (degenerative joint disease) of knee   . Sleep apnea     CPAP  . Chronic anticoagulation   . Aortic valve disease     2009: mild stenosis and regurgitation; peak gradient of 30-35 mmHg , subsequent AVR at [facility]  . Incontinence  Mild  . Carcinoma of breast     Right mastectomy; radiation and hormonal therapy  . Hip fracture, left 12/12/2012  . Foot injury 06/25/2012  . Degenerative joint disease of knee 01/29/2008    Bilateral    . BREAST CANCER, HX OF 01/29/2008    Lumpectomy, radiation therapy  Arimidex stopped in July 2013    . Syncope and collapse 11/11/2012   Past Surgical History  Procedure  Laterality Date  . Mastectomy partial / lumpectomy w/ axillary lymphadenectomy  2008    Right for carcinoma;  . Exploration post operative open heart    . Colonoscopy      Approximately 2000  . Cardiac valve replacement      [facility]   Social History:  reports that she has never smoked. She does not have any smokeless tobacco history on file. She reports that she does not drink alcohol or use illicit drugs. Lives at home with her daughter as primary care giver. Requires high level of assistance with ADLs and mobility.  Allergies  Allergen Reactions  . Nsaids     Bleeding ulcer  . Codeine     Family History  Problem Relation Age of Onset  . Heart disease Mother   . Stroke Father   . Cancer Sister   . Heart disease Sister   . Stroke Sister   . Stroke Brother    father-deceased-56-CVA  mother-deceased-47-died in her sleep  14 siblings--Heart disease, CVA, brain cancer  6 children--son with MI x 2, one son deceased from lung cancer   Prior to Admission medications   Medication Sig Start Date End Date Taking? Authorizing Provider  albuterol (PROVENTIL HFA;VENTOLIN HFA) 108 (90 BASE) MCG/ACT inhaler Inhale 2 puffs into the lungs every 4 (four) hours as needed for wheezing. 10/15/12 10/15/13  Salley Scarlet, MD  albuterol (PROVENTIL) (2.5 MG/3ML) 0.083% nebulizer solution Take 3 mLs (2.5 mg total) by nebulization every 6 (six) hours as needed for wheezing. 11/08/12   Salley Scarlet, MD  amLODipine (NORVASC) 5 MG tablet Take 5 mg by mouth daily.    Historical Provider, MD  buPROPion (WELLBUTRIN) 75 MG tablet Take 0.5 tablets (37.5 mg total) by mouth 2 (two) times daily. 01/03/13   Salley Scarlet, MD  calcium carbonate (OS-CAL) 600 MG TABS Take 600 mg by mouth daily.    Historical Provider, MD  citalopram (CELEXA) 40 MG tablet Take 0.5 tablets (20 mg total) by mouth daily. 11/12/12   Hollice Espy, MD  clotrimazole-betamethasone (LOTRISONE) cream Apply 1 application topically 2 (two)  times daily.    Historical Provider, MD  diclofenac sodium (VOLTAREN) 1 % GEL Apply 2 g topically daily as needed (for knee and joint pain).    Historical Provider, MD  furosemide (LASIX) 40 MG tablet Take 1 tablet (40 mg total) by mouth daily. For fluid 05/21/12   Salley Scarlet, MD  glimepiride (AMARYL) 1 MG tablet TAKE 1 TABLET DAILY BEFORE BREAKFAST. 11/23/12   Salley Scarlet, MD  glucose blood test strip Use as instructed 01/14/13   Salley Scarlet, MD  HYDROcodone-acetaminophen (NORCO) 5-325 MG per tablet Take 1 tablet by mouth every 6 (six) hours as needed for pain. 12/10/12 12/10/13  Salley Scarlet, MD  Krill Oil 500 MG CAPS Take 1 capsule by mouth daily.    Historical Provider, MD  levothyroxine (SYNTHROID, LEVOTHROID) 50 MCG tablet Take 50 mcg by mouth daily.    Historical Provider, MD  lisinopril (PRINIVIL,ZESTRIL) 40 MG  tablet TAKE ONE TABLET DAILY FOR BLOOD PRESSURE. 01/03/13   Salley Scarlet, MD  metoprolol succinate (TOPROL-XL) 25 MG 24 hr tablet Take 25 mg by mouth daily.    Historical Provider, MD  Multiple Vitamin (MULTIVITAMIN) tablet Take 1 tablet by mouth daily.    Historical Provider, MD  omeprazole (PRILOSEC) 20 MG capsule Take 1 capsule (20 mg total) by mouth 2 (two) times daily. For stomach acid 05/21/12   Salley Scarlet, MD  polyethylene glycol powder (GLYCOLAX/MIRALAX) powder MIX 1 CAPFUL (17G) IN 8 OUNCES OF JUICE/WATER AND DRINK ONCE DAILY. HOLD FOR LOOSE STOOLS. 11/29/12   Salley Scarlet, MD  potassium chloride SA (K-DUR,KLOR-CON) 20 MEQ tablet Take 1 tablet (20 mEq total) by mouth daily. For potassium and fluid pill 05/21/12   Salley Scarlet, MD  promethazine [facility]) 12.5 MG tablet One tablet once daily, as needed, for severe nausea 01/08/13   Kerri Perches, MD  simvastatin (ZOCOR) 20 MG tablet Take 1 tablet (20 mg total) by mouth at bedtime. 10/27/11   Salley Scarlet, MD  warfarin (COUMADIN) 5 MG tablet Take 1 tablet (5 mg total) by mouth daily. Takes at  1700. 12/02/12   Kathlen Brunswick, MD   Physical Exam: Filed Vitals:   01/14/13 1517 01/14/13 1836 01/14/13 1900  BP: 105/78 130/55 110/67  Pulse: 67 79 59  Temp: 97.6 F (36.4 C)    TempSrc: Oral Oral   Resp:  20   SpO2: 96%       General:  Lethargic, moderately ill appearing woman, complaining of pain in her back and lower abdomen, nauseated-c/o thirst.  Eyes: non icteric, no gaze problems  ENT: dry MM, no exudates   Neck: supple no thyromegaly, no obvious JVD  Cardiovascular: IRIR, SEM RSB  Respiratory: Scattered wheezes but overall good air movement.  Abdomen: soft, mild, diffuse tenderness worse over bladder.  Skin: perineum macerated, red, inflamed, intertigo   Musculoskeletal: no edema, upper and LE pulses 2+, knee OA deformity  Psychiatric: flat affect, drowsy  Neurologic: non-focal, unable to participate with complete testing due to weakness, pain, nausea.  Labs on Admission:  Basic Metabolic Panel:  Recent Labs Lab 01/14/13 1549  NA 116*  K 4.0  CL 81*  CO2 24  GLUCOSE 95  BUN 4*  CREATININE 0.48*  CALCIUM 9.2   Liver Function Tests:  Recent Labs Lab 01/14/13 1549  AST 21  ALT 8  ALKPHOS 72  BILITOT 0.5  PROT 7.2  ALBUMIN 3.7    Recent Labs Lab 01/14/13 1549  LIPASE 18   CBC:  Recent Labs Lab 01/14/13 1549  WBC 8.9  NEUTROABS 5.1  HGB 13.5  HCT 37.0  MCV 78.2  PLT 267   Cardiac Enzymes:  Recent Labs Lab 01/14/13 1604  TROPONINI <0.30    BNP (last 3 results)  Recent Labs  11/11/12 0108  PROBNP 354.6   CBG: No results found for this basename: GLUCAP,  in the last 168 hours  Radiological Exams on Admission: Dg Chest 2 View  01/14/2013  *RADIOLOGY REPORT*  Clinical Data: Vomiting.  Weakness.  CHEST - 2 VIEW  Comparison: 02/10, 02/09, and 10/15/2012  Findings: There is chronic cardiomegaly.  Pulmonary vascularity is normal.  Lungs are clear.  No effusions.  No acute osseous abnormality.  IMPRESSION: No acute  abnormality.  Chronic cardiomegaly.   Original Report Authenticated By: Francene Boyers, M.D.    Ct Abdomen Pelvis W Contrast  01/14/2013  *RADIOLOGY REPORT*  Clinical Data: Vomiting, abdominal pain  CT ABDOMEN AND PELVIS WITH CONTRAST  Technique:  Multidetector CT imaging of the abdomen and pelvis was performed following the standard protocol during bolus administration of intravenous contrast.  Contrast: 50mL OMNIPAQUE IOHEXOL 300 MG/ML  SOLN, OMNIPAQUE IOHEXOL 300 MG/ML  SOLN  Comparison: Lumbar spine x-rays 04/02/2012.  Findings: Lung bases are unremarkable.  Degenerative changes are noted lumbar spine at L5 S1 level.  There is diffuse osteopenia. Stable compression fractures of L1 and L2 vertebral bodies.  Enhanced liver is unremarkable.  Cardiomegaly is noted.  Pancreas, spleen and adrenal glands are unremarkable.  Atherosclerotic calcifications of the abdominal aorta and the iliac arteries.  No aortic aneurysm.  Mild distended gallbladder without pericholecystic fluid.  Probable tiny gallstones within gallbladder the largest measures 3 mm.  The kidneys are symmetrical in size and enhancement.  Mild lobulated renal contour.  No hydronephrosis or hydroureter.  Delayed renal images shows bilateral renal symmetrical excretion.  There is no small bowel obstruction.  Limited assessment of distal small bowel which is non opacified with contrast.  There is moderate gaseous distention of the transverse colon.  No pericecal inflammation.  The sigmoid colon is empty collapsed. Multiple sigmoid colon diverticula are noted.  No evidence of acute diverticulitis.  The splenic flexure and the colon left colon are empty collapsed.  Atrophic uterus is noted.  The urinary bladder is unremarkable. There is a small umbilical hernia containing a short segment of the anterior wall of the transverse colon without evidence of acute complication.  IMPRESSION:  1.  Degenerative changes lumbar spine.  Diffuse osteopenia.  Stable  compression fractures of L1 and L2 vertebral bodies. 2.  Atherosclerotic calcifications of the abdominal aorta and the iliac arteries.  No aortic aneurysm. 3.  Probable tiny gallstones within gallbladder the largest measures 3 mm.  No pericholecystic fluid. 4.  No hydronephrosis or hydroureter. 5.  No bowel obstruction.  Moderate gaseous distention of the transverse colon.  There is a small umbilical hernia containing short segment of the anterior wall of the transverse colon without evidence of acute inflammation or complication.  6.  Sigmoid colon diverticula are noted.  No evidence of acute diverticulitis. 7.  Cardiomegaly is noted.   Original Report Authenticated By: Natasha Mead, M.D.     EKG: Independently reviewed. A-Fib, partial LBBB.  Assessment/Plan Principal Problem:   Hyponatremia Active Problems:   DIABETES MELLITUS, TYPE II   HYPERLIPIDEMIA   Eosinophilia   HYPERTENSION   Atrial fibrillation, chronic   GERD   BREAST CANCER, HX OF   Chronic anticoagulation   Sleep apnea   Aortic valve disease   Depression   Hypothyroidism   Gait instability   Difficulty hearing   Brain aneurysm   Compression fracture   Weakness   Osteoarthritis of both knees   Vomiting and Abdominal Pain   1. Symptomatic Hyponatremia (116), recurrence, persistent (?SIADH vs. Thyroid vs. Adrenal Insuff)  Check for hypothyroidism TSH, Free T4  Abdominal CT normal, CXR normal, will obtain Chest CT given chronic cough to eval for malignancy induced SIADH, will also get non-contrasted CT head to eval for intracranial process given hx of aneurysm.  Volume status: difficult to determine, her Cr is normal so not pre-renal- on exam she looks dry and she has been vomiting for 1 week will cautiously continue hydration-she has gotten over a liter in ED, will check repeat BMET now and adjust accordingly. Hold Diuretic. No fluid restriction for now.Per history she is  orthostatic.  Repeat Hyponatremia work up Urine  osm, Serum Osm, Urine Na, Cortisol.  She has a history of frequent steroid use for recurrent bronchitis, may be AI with illness trigger. If Na remains low and K normal, and cortisol low may start empiric solu-cortef.  If Na decreases with fluids, and labs return with SIADH picture will again fluid restrict and start DVap.  BMET q2, Neurochecks  Ativan prn for muscle spasms and seizure prophylaxis.  Discontinue all medications that could cause SIADH including her Celexa, Wellbutrin, Omeprazole, ACEI.  2.   Eosinophilia, chronic, isolated, but quite elevated, suspicious for malignancy non-hematologic.  Checking CT head and chest.  Consider hematology consult. She may also have allergic component to recurrent bronchitis.  Check IgE and peripheral smear.  Start Claritin and H2 blocker  3. Acute on chronic Pain, multifactorial, joint pain from advanced OA, compression fractures, neuropathy, muscle spasms from low NA, perineal irritation, bladder spasms.  IV dilaudid and ativan, tolerating well  4. Dysuria, Vaginitis, Perineal Maceration/Rash, severe caused her extreme pain to have Foley placed, this is a chronic problem, no obvious urine obstruction, no hydro on CT or bladder wall thickening, needs complete outpateint evaluation, possible secondary low level soft tissue infection.  Topical Hydrocortisone and Lotrimin.  5. A-Fib, stable well controlled  maintain anticoagulation with warfarin.  Continue Metoprolol  6. Valvular Disease,Cardiomegaly, prior aortic valve replacement  Check BNP  2D echo evaluation  7. OSA/Sleep Apnea  Home CPAP, qhs  8. Hypothyroidism, TSH, Free T4 pending  9. Gait Instability, probably secondary to hyponatremia and OA  10. Recurrent Bronchitis, Chronic Cough  Does not have COPD by PFT or CXR or risk factors  CT Chest w/ contrast pending  Albuterol prn    Code Status: Full Code, discussed in detail with family-patient unable to  participate, family will consider their options and discuss them, encouraged advance directive and HCPOA. Would not want long term interventions or feeding tubes. Family Communication: Discussed with three of her children at the bedside. Disposition Plan: Home with her daughter when medically stable, inpatient status.   Time spent: 90 minutes  [name] Pager [phone]  If 7PM-7AM, please contact night-coverage [email or web address] Password [facility] 01/14/2013, 7:55 PM

## 2013-01-14 NOTE — ED Notes (Signed)
While receiving report from outgoing nurse, nurse states she had already stuck pt in right arm before pt told her that her right arm was a restricted extremity. I will change IV to left arm and place a restricted extremity armband on pt. Charge nurse made aware.

## 2013-01-14 NOTE — ED Notes (Signed)
Pt states that her pain is better, no complaint of nausea.  Family at bedside.

## 2013-01-14 NOTE — Telephone Encounter (Signed)
Noted  

## 2013-01-14 NOTE — ED Notes (Addendum)
C/o n/v and lower abd pain radiating to lower back x 1 wk.  Family called PCP, rx for zofran given with no relief.  Denies diarrhea.  cbg en route via EMS 109.

## 2013-01-14 NOTE — ED Notes (Signed)
attempted to give report to [place], she will call right back

## 2013-01-14 NOTE — ED Notes (Addendum)
Dr. Phillips Odor, hospitalist states that I do not have to change IV in pts right arm and that she would write an order for it or note about it.Marland Kitchen

## 2013-01-14 NOTE — ED Notes (Signed)
Patient is resting comfortably. 

## 2013-01-14 NOTE — ED Provider Notes (Addendum)
History  This chart was scribed for [name], MD by Bennett Scrape, ED Scribe. This patient was seen in room APA08/APA08 and the patient's care was started at 3:18 PM.  CSN: [id number]  Arrival date & time 01/14/13  1503   First MD Initiated Contact with Patient 01/14/13 1518      Chief Complaint  Patient presents with  . Emesis     Patient is a 79 y.o. female presenting with vomiting. The history is provided by the patient. No language interpreter was used.  Emesis Severity:  Mild Duration:  7 days Timing:  Constant Number of daily episodes:  1-2 Quality:  Stomach contents Progression:  Unchanged Chronicity:  New Context: not post-tussive   Relieved by:  Antiemetics Exacerbated by: eating. Associated symptoms: abdominal pain and headaches   Associated symptoms: no chills, no diarrhea and no sore throat   Risk factors: no sick contacts     Kristin Logan is a 79 y.o. female who presents to the Emergency Department BIBA complaining of one week of emesis, 1 to 2 episodes per day usually after eating, with associated lower abdominal pain that radiates to her lower back. She denies nausea currently. Daughter states that she takes phenergan at home but pt denies taking antiemetic today. She reports neck pain and HA with the original onset that has since improved. She states that she has constipation and has been "taking something to make them move". She denies being on O2 at home. She denies hematemesis, dysuria, hematuria, diarrhea, SOB and CP as associated symptoms. 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  Past Medical History  Diagnosis Date  . Diabetes mellitus   . Hyperlipidemia   . Eosinophilia   . Cataract   . Hypertension   . Atrial fibrillation prior to 2008    moderate biatrial enlargement in 2009; mild to moderate LVH with a low-normal EF  . GERD  (gastroesophageal reflux disease)     Hemorrhoids; history of peptic ulcer disease  . DJD (degenerative joint disease) of knee   . Sleep apnea     CPAP  . Chronic anticoagulation   . Aortic valve disease     2009: mild stenosis and regurgitation; peak gradient of 30-35 mmHg , subsequent [facility]R [facility]  . Incontinence     Mild  . Carcinoma of breast     Right mastectomy; radiation and hormonal therapy    Past Surgical History  Procedure Laterality Date  . Mastectomy partial / lumpectomy w/ axillary lymphadenectomy  2008    Right for carcinoma;  . Exploration post operative open heart    . Colonoscopy      Approximately 2000  . Cardiac valve replacement      [facility]    Family History  Problem Relation Age of Onset  . Heart disease Mother   . Stroke Father   . Cancer Sister   . Heart disease Sister   . Stroke Sister   . Stroke Brother     History  Substance Use Topics  . Smoking status: Never Smoker   . Smokeless tobacco: Not on file  . Alcohol Use: No    No OB history provided.  Review of Systems  Constitutional: Negative for fever and chills.  HENT: Positive for neck pain. Negative for congestion and sore throat.   Eyes: Negative for visual disturbance.  Respiratory: Positive for cough.  Negative for shortness of breath.   Cardiovascular: Negative for chest pain and leg swelling.  Gastrointestinal: Positive for vomiting and abdominal pain. Negative for nausea and diarrhea.  Genitourinary: Negative for dysuria and hematuria.  Musculoskeletal: Negative for back pain.  Skin: Negative for rash.  Neurological: Positive for headaches.  Hematological: Bruises/bleeds easily.  Psychiatric/Behavioral: Negative for confusion.      Allergies  Nsaids and Codeine  Home Medications   Current Outpatient Rx  Name  Route  Sig  Dispense  Refill  . albuterol (PROVENTIL HFA;VENTOLIN HFA) 108 (90 BASE) MCG/ACT inhaler   Inhalation   Inhale 2 puffs into the lungs every 4 (four)  hours as needed for wheezing.   1 Inhaler   2   . albuterol (PROVENTIL) (2.5 MG/3ML) 0.083% nebulizer solution   Nebulization   Take 3 mLs (2.5 mg total) by nebulization every 6 (six) hours as needed for wheezing.   75 mL   3   . amLODipine (NORVASC) 5 MG tablet   Oral   Take 5 mg by mouth daily.         Marland Kitchen buPROPion (WELLBUTRIN) 75 MG tablet   Oral   Take 0.5 tablets (37.5 mg total) by mouth 2 (two) times daily.   30 tablet   2   . calcium carbonate (OS-CAL) 600 MG TABS   Oral   Take 600 mg by mouth daily.         . citalopram (CELEXA) 40 MG tablet   Oral   Take 0.5 tablets (20 mg total) by mouth daily.   30 tablet   0   . clotrimazole-betamethasone (LOTRISONE) cream   Topical   Apply 1 application topically 2 (two) times daily.         . diclofenac sodium (VOLTAREN) 1 % GEL   Topical   Apply 2 g topically daily as needed (for knee and joint pain).         . furosemide (LASIX) 40 MG tablet   Oral   Take 1 tablet (40 mg total) by mouth daily. For fluid   30 tablet   6   . glimepiride (AMARYL) 1 MG tablet      TAKE 1 TABLET DAILY BEFORE BREAKFAST.   30 tablet   1   . glucose blood test strip      Use as instructed   50 each   12     ACCU-CHEK AVIVA testing strips  Daily testing  2 ...   . HYDROcodone-acetaminophen (NORCO) 5-325 MG per tablet   Oral   Take 1 tablet by mouth every 6 (six) hours as needed for pain.   60 tablet   2   . Krill Oil 500 MG CAPS   Oral   Take 1 capsule by mouth daily.         Marland Kitchen levothyroxine (SYNTHROID, LEVOTHROID) 50 MCG tablet   Oral   Take 50 mcg by mouth daily.         Marland Kitchen lisinopril (PRINIVIL,ZESTRIL) 40 MG tablet      TAKE ONE TABLET DAILY FOR BLOOD PRESSURE.   30 tablet   3   . metoprolol succinate (TOPROL-XL) 25 MG 24 hr tablet   Oral   Take 25 mg by mouth daily.         . Multiple Vitamin (MULTIVITAMIN) tablet   Oral   Take 1 tablet by mouth daily.         Marland Kitchen omeprazole (PRILOSEC) 20  MG capsule  Oral   Take 1 capsule (20 mg total) by mouth 2 (two) times daily. For stomach acid   60 capsule   6   . polyethylene glycol powder (GLYCOLAX/MIRALAX) powder      MIX 1 CAPFUL (17G) IN 8 OUNCES OF JUICE/WATER AND DRINK ONCE DAILY. HOLD FOR LOOSE STOOLS.   527 g   3   . potassium chloride SA (K-DUR,KLOR-CON) 20 MEQ tablet   Oral   Take 1 tablet (20 mEq total) by mouth daily. For potassium and fluid pill   30 tablet   6   . promethazine (PHENERGAN) 12.5 MG tablet      One tablet once daily, as needed, for severe nausea   7 tablet   0   . simvastatin (ZOCOR) 20 MG tablet   Oral   Take 1 tablet (20 mg total) by mouth at bedtime.   30 tablet   6   . warfarin (COUMADIN) 5 MG tablet   Oral   Take 1 tablet (5 mg total) by mouth daily. Takes at 1700.   60 tablet   3     Triage Vitals: BP 105/78  Pulse 67  Temp(Src) 97.6 F (36.4 C) (Oral)  SpO2 96%  Physical Exam  Nursing note and vitals reviewed. Constitutional: She is oriented to person, place, and time. She appears well-developed and well-nourished. No distress.  HENT:  Head: Normocephalic and atraumatic.  Mouth/Throat: Oropharynx is clear and moist.  Eyes: Conjunctivae and EOM are normal. Pupils are equal, round, and reactive to light.  Sclera are clear  Neck: Neck supple. No tracheal deviation present. No thyromegaly present.  Cardiovascular: Normal rate and regular rhythm.   No murmur heard. Pulmonary/Chest: Effort normal. No respiratory distress. She has no wheezes.  Decreased breath sounds bilaterally  Abdominal: Soft. Bowel sounds are normal. She exhibits no distension. There is no tenderness.  Musculoskeletal: Normal range of motion. She exhibits no edema (no ankle swelling).  Lymphadenopathy:    She has no cervical adenopathy.  Neurological: She is alert and oriented to person, place, and time. No cranial nerve deficit.  Pt able to move both sets of fingers and toes  Skin: Skin is warm and  dry. No rash noted.  Psychiatric: She has a normal mood and affect. Her behavior is normal.    ED Course  Procedures (including critical care time)  Medications  0.9 %  sodium chloride infusion ( Intravenous New Bag/Given 01/14/13 1623)  sodium chloride 0.9 % bolus 250 mL (0 mLs Intravenous Stopped 01/14/13 1623)  ondansetron (ZOFRAN) injection 4 mg (4 mg Intravenous Given 01/14/13 1604)  iohexol (OMNIPAQUE) 300 MG/ML solution 50 mL (50 mLs Oral Contrast Given 01/14/13 1628)  HYDROmorphone (DILAUDID) injection 0.5 mg (0.5 mg Intravenous Given 01/14/13 1638)  iohexol (OMNIPAQUE) 300 MG/ML solution 100 mL (100 mLs Intravenous Contrast Given 01/14/13 1728)    DIAGNOSTIC STUDIES: Oxygen Saturation is 96% on room air, normal by my interpretation.    COORDINATION OF CARE: 3:33 PM-Discussed treatment plan which includes IV fluids, antiemetic, CT of abdomen, CXR, CBC panel, CMP, and UA with pt at bedside and pt agreed to plan.   6:19 PM-Consult complete with Dr. Rito Ehrlich, hospitalist. Patient case explained and discussed. Dr. Rito Ehrlich agrees to admit patient for further evaluation and treatment to observation, team 1. Call ended at 6:20 PM  Labs Reviewed  CBC WITH DIFFERENTIAL - Abnormal; Notable for the following:    MCHC 36.5 (*)    Eosinophils Relative 14 (*)  Eosinophils Absolute 1.2 (*)    All other components within normal limits  COMPREHENSIVE METABOLIC PANEL - Abnormal; Notable for the following:    Sodium 116 (*)    Chloride 81 (*)    BUN 4 (*)    Creatinine, Ser 0.48 (*)    All other components within normal limits  PROTIME-INR - Abnormal; Notable for the following:    Prothrombin Time 23.6 (*)    INR 2.21 (*)    All other components within normal limits  URINE CULTURE  LIPASE, BLOOD  URINALYSIS, ROUTINE W REFLEX MICROSCOPIC  TROPONIN I   Dg Chest 2 View  01/14/2013  *RADIOLOGY REPORT*  Clinical Data: Vomiting.  Weakness.  CHEST - 2 VIEW  Comparison: 02/10, 02/09, and  10/15/2012  Findings: There is chronic cardiomegaly.  Pulmonary vascularity is normal.  Lungs are clear.  No effusions.  No acute osseous abnormality.  IMPRESSION: No acute abnormality.  Chronic cardiomegaly.   Original Report Authenticated By: Francene Boyers, M.D.    Ct Abdomen Pelvis W Contrast  01/14/2013  *RADIOLOGY REPORT*  Clinical Data: Vomiting, abdominal pain  CT ABDOMEN AND PELVIS WITH CONTRAST  Technique:  Multidetector CT imaging of the abdomen and pelvis was performed following the standard protocol during bolus administration of intravenous contrast.  Contrast: 50mL OMNIPAQUE IOHEXOL 300 MG/ML  SOLN, OMNIPAQUE IOHEXOL 300 MG/ML  SOLN  Comparison: Lumbar spine x-rays 04/02/2012.  Findings: Lung bases are unremarkable.  Degenerative changes are noted lumbar spine at L5 S1 level.  There is diffuse osteopenia. Stable compression fractures of L1 and L2 vertebral bodies.  Enhanced liver is unremarkable.  Cardiomegaly is noted.  Pancreas, spleen and adrenal glands are unremarkable.  Atherosclerotic calcifications of the abdominal aorta and the iliac arteries.  No aortic aneurysm.  Mild distended gallbladder without pericholecystic fluid.  Probable tiny gallstones within gallbladder the largest measures 3 mm.  The kidneys are symmetrical in size and enhancement.  Mild lobulated renal contour.  No hydronephrosis or hydroureter.  Delayed renal images shows bilateral renal symmetrical excretion.  There is no small bowel obstruction.  Limited assessment of distal small bowel which is non opacified with contrast.  There is moderate gaseous distention of the transverse colon.  No pericecal inflammation.  The sigmoid colon is empty collapsed. Multiple sigmoid colon diverticula are noted.  No evidence of acute diverticulitis.  The splenic flexure and the colon left colon are empty collapsed.  Atrophic uterus is noted.  The urinary bladder is unremarkable. There is a small umbilical hernia containing a short  segment of the anterior wall of the transverse colon without evidence of acute complication.  IMPRESSION:  1.  Degenerative changes lumbar spine.  Diffuse osteopenia.  Stable compression fractures of L1 and L2 vertebral bodies. 2.  Atherosclerotic calcifications of the abdominal aorta and the iliac arteries.  No aortic aneurysm. 3.  Probable tiny gallstones within gallbladder the largest measures 3 mm.  No pericholecystic fluid. 4.  No hydronephrosis or hydroureter. 5.  No bowel obstruction.  Moderate gaseous distention of the transverse colon.  There is a small umbilical hernia containing short segment of the anterior wall of the transverse colon without evidence of acute inflammation or complication.  6.  Sigmoid colon diverticula are noted.  No evidence of acute diverticulitis. 7.  Cardiomegaly is noted.   Original Report Authenticated By: Natasha Mead, M.D.      Date: 01/14/2013  Rate: 65  Rhythm: atrial fibrillation and premature atrial contractions (PAC)  QRS Axis: normal  Intervals: normal  ST/T Wave abnormalities: nonspecific ST/T changes and ST depressions laterally  Conduction Disutrbances:left bundle branch block  Narrative Interpretation:   Old EKG Reviewed: unchanged Direction not PACs and PVCs. No significant change in EKG compared to 12/13/2011 other than inferiorly slight increase in ST segment depression.  Results for orders placed during the hospital encounter of 01/14/13  CBC WITH DIFFERENTIAL      Result Value Range   WBC 8.9  4.0 - 10.5 K/uL   RBC 4.73  3.87 - 5.11 MIL/uL   Hemoglobin 13.5  12.0 - 15.0 g/dL   HCT 16.1  09.6 - 04.5 %   MCV 78.2  78.0 - 100.0 fL   MCH 28.5  26.0 - 34.0 pg   MCHC 36.5 (*) 30.0 - 36.0 g/dL   RDW 40.9  81.1 - 91.4 %   Platelets 267  150 - 400 K/uL   Neutrophils Relative 58  43 - 77 %   Neutro Abs 5.1  1.7 - 7.7 K/uL   Lymphocytes Relative 20  12 - 46 %   Lymphs Abs 1.8  0.7 - 4.0 K/uL   Monocytes Relative 8  3 - 12 %   Monocytes Absolute  0.7  0.1 - 1.0 K/uL   Eosinophils Relative 14 (*) 0 - 5 %   Eosinophils Absolute 1.2 (*) 0.0 - 0.7 K/uL   Basophils Relative 1  0 - 1 %   Basophils Absolute 0.1  0.0 - 0.1 K/uL  COMPREHENSIVE METABOLIC PANEL      Result Value Range   Sodium 116 (*) 135 - 145 mEq/L   Potassium 4.0  3.5 - 5.1 mEq/L   Chloride 81 (*) 96 - 112 mEq/L   CO2 24  19 - 32 mEq/L   Glucose, Bld 95  70 - 99 mg/dL   BUN 4 (*) 6 - 23 mg/dL   Creatinine, Ser 7.82 (*) 0.50 - 1.10 mg/dL   Calcium 9.2  8.4 - 95.6 mg/dL   Total Protein 7.2  6.0 - 8.3 g/dL   Albumin 3.7  3.5 - 5.2 g/dL   AST 21  0 - 37 U/L   ALT 8  0 - 35 U/L   Alkaline Phosphatase 72  39 - 117 U/L   Total Bilirubin 0.5  0.3 - 1.2 mg/dL   GFR calc non Af Amer >90  >90 mL/min   GFR calc Af Amer >90  >90 mL/min  LIPASE, BLOOD      Result Value Range   Lipase 18  11 - 59 U/L  URINALYSIS, ROUTINE W REFLEX MICROSCOPIC      Result Value Range   Color, Urine YELLOW  YELLOW   APPearance CLEAR  CLEAR   Specific Gravity, Urine 1.020  1.005 - 1.030   pH 6.0  5.0 - 8.0   Glucose, UA NEGATIVE  NEGATIVE mg/dL   Hgb urine dipstick NEGATIVE  NEGATIVE   Bilirubin Urine NEGATIVE  NEGATIVE   Ketones, ur NEGATIVE  NEGATIVE mg/dL   Protein, ur NEGATIVE  NEGATIVE mg/dL   Urobilinogen, UA 0.2  0.0 - 1.0 mg/dL   Nitrite NEGATIVE  NEGATIVE   Leukocytes, UA NEGATIVE  NEGATIVE  TROPONIN I      Result Value Range   Troponin I <0.30  <0.30 ng/mL  PROTIME-INR      Result Value Range   Prothrombin Time 23.6 (*) 11.6 - 15.2 seconds   INR 2.21 (*) 0.00 - 1.49     1. Hyponatremia  2. Nausea       MDM  Patient's workup revealing no significant hyponatremia. She was admitted the for a similar problem back in February. Patient is still on Lasix this could potentially be the culprit. Patient has been very fatigued and tired had some mild nausea and a mild headache. Patient does have a history of a aneurysm that they cannot fix surgically but symptoms do not seem to  be consistent with an aneurysmal bleed. Patient's workup otherwise is negative she is on Coumadin for atrial fib INR is therapeutic. And her EKG is consistent with atrial fib, but without fast rate. CT abdomen shows an umbilical hernia without strain related bowel. Part of the uterus: Wall is up against. Suspect of patient's fatigue and perhaps the nausea is related to the hyponatremia. Discussed with hospitalist team they will mid to an observation bed, team 1.  The patient obviously treated with the normal saline during the ED stay to help correct the hyponatremia. Chest x-rays negative for pneumonia pulmonary edema or pneumothorax. Renal function is normal. Suspect that the patient's Lasix as culprit for the hyponatremia.     I personally performed the services described in this documentation, which was scribed in my presence. The recorded information has been reviewed and is accurate.     [name], MD 01/14/13 1610  [name], MD 01/14/13 [phone]

## 2013-01-14 NOTE — ED Notes (Signed)
CRITICAL VALUE ALERT  Critical value received:  Sodium 116  Date of notification:  01/14/2013  Time of notification:  1610  Critical value read back:yes  Nurse who received alert:  Hardie Pulley, RN  MD notified (1st page):  Deretha Emory  Time of first page:  1610  MD notified (2nd page):  Time of second page:  Responding MD:  Deretha Emory  Time MD responded:  [phone]

## 2013-01-14 NOTE — ED Notes (Signed)
MD at bedside. 

## 2013-01-15 ENCOUNTER — Encounter (HOSPITAL_COMMUNITY): Payer: Medicare Other

## 2013-01-15 ENCOUNTER — Telehealth: Payer: Self-pay | Admitting: Family Medicine

## 2013-01-15 ENCOUNTER — Inpatient Hospital Stay (HOSPITAL_COMMUNITY): Payer: Medicare Other

## 2013-01-15 ENCOUNTER — Encounter (HOSPITAL_COMMUNITY): Payer: Self-pay | Admitting: Radiology

## 2013-01-15 DIAGNOSIS — R110 Nausea: Secondary | ICD-10-CM

## 2013-01-15 DIAGNOSIS — E119 Type 2 diabetes mellitus without complications: Secondary | ICD-10-CM

## 2013-01-15 DIAGNOSIS — Z7901 Long term (current) use of anticoagulants: Secondary | ICD-10-CM

## 2013-01-15 DIAGNOSIS — I369 Nonrheumatic tricuspid valve disorder, unspecified: Secondary | ICD-10-CM

## 2013-01-15 DIAGNOSIS — I671 Cerebral aneurysm, nonruptured: Secondary | ICD-10-CM

## 2013-01-15 DIAGNOSIS — I4891 Unspecified atrial fibrillation: Secondary | ICD-10-CM

## 2013-01-15 LAB — CBC
HCT: 34.2 % — ABNORMAL LOW (ref 36.0–46.0)
Hemoglobin: 12.2 g/dL (ref 12.0–15.0)
MCH: 28.5 pg (ref 26.0–34.0)
MCHC: 35.7 g/dL (ref 30.0–36.0)

## 2013-01-15 LAB — BASIC METABOLIC PANEL
BUN: 4 mg/dL — ABNORMAL LOW (ref 6–23)
BUN: 4 mg/dL — ABNORMAL LOW (ref 6–23)
CO2: 24 mEq/L (ref 19–32)
CO2: 26 mEq/L (ref 19–32)
Calcium: 8.4 mg/dL (ref 8.4–10.5)
Calcium: 8.6 mg/dL (ref 8.4–10.5)
Chloride: 86 mEq/L — ABNORMAL LOW (ref 96–112)
Creatinine, Ser: 0.55 mg/dL (ref 0.50–1.10)
Creatinine, Ser: 0.64 mg/dL (ref 0.50–1.10)
GFR calc non Af Amer: 84 mL/min — ABNORMAL LOW (ref >90)
GFR calc non Af Amer: 83 mL/min — ABNORMAL LOW (ref >90)
Glucose, Bld: 96 mg/dL (ref 70–99)
Glucose, Bld: 94 mg/dL (ref 70–99)
Glucose, Bld: 85 mg/dL (ref 70–99)
Glucose, Bld: 88 mg/dL (ref 70–99)
Potassium: 4.4 mEq/L (ref 3.5–5.1)
Potassium: 4.3 mEq/L (ref 3.5–5.1)
Sodium: 116 mEq/L — CL (ref 135–145)
Sodium: 120 mEq/L — ABNORMAL LOW (ref 135–145)

## 2013-01-15 LAB — URINE CULTURE
Colony Count: NO GROWTH
Culture: NO GROWTH

## 2013-01-15 LAB — T4, FREE: Free T4: 0.71 ng/dL — ABNORMAL LOW (ref 0.80–1.80)

## 2013-01-15 LAB — TSH: TSH: 0.383 u[IU]/mL (ref 0.350–4.500)

## 2013-01-15 LAB — OSMOLALITY: Osmolality: 245 mOsm/kg — CL (ref 275–300)

## 2013-01-15 LAB — GLUCOSE, CAPILLARY
Glucose-Capillary: 81 mg/dL (ref 70–99)
Glucose-Capillary: 91 mg/dL (ref 70–99)

## 2013-01-15 MED ORDER — FAMOTIDINE IN NACL 20-0.9 MG/50ML-% IV SOLN
20.0000 mg | Freq: Two times a day (BID) | INTRAVENOUS | Status: DC
  Administered 2013-01-15 (×3): 20 mg via INTRAVENOUS
  Filled 2013-01-15 (×4): qty 50

## 2013-01-15 MED ORDER — LORATADINE 10 MG PO TABS
10.0000 mg | ORAL_TABLET | Freq: Every day | ORAL | Status: DC
  Administered 2013-01-15 – 2013-01-21 (×7): 10 mg via ORAL
  Filled 2013-01-15 (×7): qty 1

## 2013-01-15 MED ORDER — WARFARIN SODIUM 5 MG PO TABS
5.0000 mg | ORAL_TABLET | ORAL | Status: DC
  Administered 2013-01-15: 5 mg via ORAL
  Filled 2013-01-15: qty 1

## 2013-01-15 MED ORDER — FUROSEMIDE 40 MG PO TABS
40.0000 mg | ORAL_TABLET | Freq: Every day | ORAL | Status: DC
  Administered 2013-01-15 – 2013-01-21 (×7): 40 mg via ORAL
  Filled 2013-01-15 (×7): qty 1

## 2013-01-15 MED ORDER — INSULIN ASPART 100 UNIT/ML ~~LOC~~ SOLN: 0.0000 [IU] | Freq: Every day | SUBCUTANEOUS | Status: DC

## 2013-01-15 MED ORDER — IOHEXOL 300 MG/ML  SOLN
80.0000 mL | Freq: Once | INTRAMUSCULAR | Status: AC | PRN
  Administered 2013-01-15: 80 mL via INTRAVENOUS

## 2013-01-15 MED ORDER — WARFARIN - PHARMACIST DOSING INPATIENT
Freq: Every day | Status: DC
  Administered 2013-01-20: 16:00:00

## 2013-01-15 MED ORDER — INSULIN ASPART 100 UNIT/ML ~~LOC~~ SOLN: 0.0000 [IU] | Freq: Three times a day (TID) | SUBCUTANEOUS | Status: DC

## 2013-01-15 MED ORDER — DICLOFENAC SODIUM 1 % TD GEL
TRANSDERMAL | Status: AC
  Filled 2013-01-15: qty 100

## 2013-01-15 MED ORDER — DICLOFENAC SODIUM 1 % TD GEL
2.0000 g | Freq: Three times a day (TID) | TRANSDERMAL | Status: DC | PRN
  Administered 2013-01-15 – 2013-01-18 (×5): 2 g via TOPICAL
  Filled 2013-01-15: qty 100

## 2013-01-15 MED ORDER — FAMOTIDINE IN NACL 20-0.9 MG/50ML-% IV SOLN
INTRAVENOUS | Status: AC
  Filled 2013-01-15: qty 50

## 2013-01-15 MED ORDER — WARFARIN SODIUM 5 MG PO TABS
5.0000 mg | ORAL_TABLET | Freq: Once | ORAL | Status: AC
  Administered 2013-01-15: 5 mg via ORAL
  Filled 2013-01-15: qty 1

## 2013-01-15 NOTE — Progress Notes (Signed)
[name] PM Coverage Progress Note  Serum Na, dropped with IV isotonic fluids. IV fluid discontinued and a dose of Lasix 40mg  IV was given. Serum Na monitoring q2 hours. Now Na improving with minimal intervention, stopped IV fluids, gave 1 dose of Lasix, stopped all potentially offending drugs from home med list including omeprazole and stated her on IV H2 blocker. Urine Na <10, other labs still pending. Rounding team will follow.   Anderson Malta, DO Internal Medicine

## 2013-01-15 NOTE — Progress Notes (Signed)
UR Chart Review Completed  

## 2013-01-15 NOTE — Progress Notes (Signed)
cCRITICAL VALUE ALERT  Critical value received:  NA 116  Date of notification:  01/15/13  Time of notification:  0150  Critical value read back: YES  Nurse who received alert:  Floreen Comber, RN  MD notified (1st page):  Phillips Odor  Time of first page:  0150  MD notified (2nd page):  Time of second page:  Responding MD:    Time MD responded:

## 2013-01-15 NOTE — Progress Notes (Addendum)
CRITICAL VALUE ALERT  Critical value received:  EKG AV BLOCK  Date of notification:  01/15/13  Time of notification:  0435  Critical value read back: yes  Nurse who received alert:  Floreen Comber, RN  MD notified (1st page):  Phillips Odor   Time of first page:  [phone]  MD notified (2nd page):  Time of second page:  Responding MD:   Phillips Odor   Time MD responded:  [phone]

## 2013-01-15 NOTE — Progress Notes (Signed)
*  PRELIMINARY RESULTS* Echocardiogram 2D Echocardiogram has been performed.  Conrad  01/15/2013, 2:44 PM

## 2013-01-15 NOTE — Progress Notes (Signed)
CRITICAL VALUE ALERT  Critical value received:  NA 114  Date of notification:  01/14/13  Time of notification:  2215  Critical value read back: YES  Nurse who received alert:  Floreen Comber, RN   MD notified (1st page):  Phillips Odor   Time of first page:  2215  MD notified (2nd page):  Time of second page:  Responding MD:  Phillips Odor  Time MD responded:  2215

## 2013-01-15 NOTE — Progress Notes (Signed)
ANTICOAGULATION CONSULT NOTE - Initial Consult  Pharmacy Consult for Coumadin (chronic PTA) Indication: atrial fibrillation  Allergies  Allergen Reactions  . Nsaids     Bleeding ulcer  . Codeine    Patient Measurements: Height: 5' 5.5" (166.4 cm) Weight: 215 lb 13.3 oz (97.9 kg) IBW/kg (Calculated) : 58.15  Vital Signs: Temp: 97.7 F (36.5 C) (04/16 0440) Temp src: Axillary (04/16 0440) BP: 106/65 mmHg (04/16 0440) Pulse Rate: 57 (04/16 0846)  Labs:  Recent Labs  01/14/13 1549 01/14/13 1604  01/15/13 0110 01/15/13 0321 01/15/13 0543  HGB 13.5  --   --   --   --  12.2  HCT 37.0  --   --   --   --  34.2*  PLT 267  --   --   --   --  247  LABPROT  --  23.6*  --   --   --  25.5*  INR  --  2.21*  --   --   --  2.46*  CREATININE 0.48*  --   < > 0.55 0.62 0.61  TROPONINI  --  <0.30  --   --   --  <0.30  < > = values in this interval not displayed.  Estimated Creatinine Clearance: 66.7 ml/min (by C-G formula based on Cr of 0.61).  Medical History: Past Medical History  Diagnosis Date  . Diabetes mellitus   . Hyperlipidemia   . Eosinophilia   . Cataract   . Hypertension   . Atrial fibrillation prior to 2008    moderate biatrial enlargement in 2009; mild to moderate LVH with a low-normal EF  . GERD (gastroesophageal reflux disease)     Hemorrhoids; history of peptic ulcer disease  . DJD (degenerative joint disease) of knee   . Sleep apnea     CPAP  . Chronic anticoagulation   . Aortic valve disease     2009: mild stenosis and regurgitation; peak gradient of 30-35 mmHg , subsequent [facility]R at [facility]  . Incontinence     Mild  . Carcinoma of breast     Right mastectomy; radiation and hormonal therapy  . Hip fracture, left 12/12/2012  . Foot injury 06/25/2012  . Degenerative joint disease of knee 01/29/2008    Bilateral    . BREAST CANCER, HX OF 01/29/2008    Lumpectomy, radiation therapy  Arimidex stopped in July 2013    . Syncope and collapse 11/11/2012    Medications:  Prescriptions prior to admission  Medication Sig Dispense Refill  . warfarin (COUMADIN) 5 MG tablet Take 1 tablet (5 mg total) by mouth daily. Takes at 1700.  60 tablet  3  . albuterol (PROVENTIL HFA;VENTOLIN HFA) 108 (90 BASE) MCG/ACT inhaler Inhale 2 puffs into the lungs every 4 (four) hours as needed for wheezing.  1 Inhaler  2  . albuterol (PROVENTIL) (2.5 MG/3ML) 0.083% nebulizer solution Take 3 mLs (2.5 mg total) by nebulization every 6 (six) hours as needed for wheezing.  75 mL  3  . amLODipine (NORVASC) 5 MG tablet Take 5 mg by mouth daily.      Marland Kitchen buPROPion (WELLBUTRIN) 75 MG tablet Take 0.5 tablets (37.5 mg total) by mouth 2 (two) times daily.  30 tablet  2  . calcium carbonate (OS-CAL) 600 MG TABS Take 600 mg by mouth daily.      . clotrimazole-betamethasone (LOTRISONE) cream Apply 1 application topically 2 (two) times daily.      . diclofenac sodium (VOLTAREN) 1 %  GEL Apply 2 g topically daily as needed (for knee and joint pain).      . furosemide (LASIX) 40 MG tablet Take 1 tablet (40 mg total) by mouth daily. For fluid  30 tablet  6  . glucose blood test strip Use as instructed  50 each  12  . HYDROcodone-acetaminophen (NORCO) 5-325 MG per tablet Take 1 tablet by mouth every 6 (six) hours as needed for pain.  60 tablet  2  . Krill Oil 500 MG CAPS Take 1 capsule by mouth daily.      Marland Kitchen levothyroxine (SYNTHROID, LEVOTHROID) 50 MCG tablet Take 50 mcg by mouth daily.      Marland Kitchen lisinopril (PRINIVIL,ZESTRIL) 40 MG tablet TAKE ONE TABLET DAILY FOR BLOOD PRESSURE.  30 tablet  3  . metoprolol succinate (TOPROL-XL) 25 MG 24 hr tablet Take 25 mg by mouth daily.      . Multiple Vitamin (MULTIVITAMIN) tablet Take 1 tablet by mouth daily.      Marland Kitchen omeprazole (PRILOSEC) 20 MG capsule Take 1 capsule (20 mg total) by mouth 2 (two) times daily. For stomach acid  60 capsule  6  . polyethylene glycol powder (GLYCOLAX/MIRALAX) powder MIX 1 CAPFUL (17G) IN 8 OUNCES OF JUICE/WATER AND DRINK  ONCE DAILY. HOLD FOR LOOSE STOOLS.  527 g  3  . potassium chloride SA (K-DUR,KLOR-CON) 20 MEQ tablet Take 1 tablet (20 mEq total) by mouth daily. For potassium and fluid pill  30 tablet  6  . promethazine (PHENERGAN) 12.5 MG tablet One tablet once daily, as needed, for severe nausea  7 tablet  0  . simvastatin (ZOCOR) 20 MG tablet Take 1 tablet (20 mg total) by mouth at bedtime.  30 tablet  6    Assessment: 79yo female on chronic Coumadin PTA.  Pt is maintained on Coumadin 5mg  daily.  INR is therapeutic. Goal of Therapy:  INR 2-3 Monitor platelets by anticoagulation protocol: Yes   Plan: Coumadin 5mg  po daily (home regimen) INR daily for now.  Valrie Hart A 01/15/2013,11:00 AM

## 2013-01-15 NOTE — Telephone Encounter (Signed)
Spoke with Dr. Irene Limbo about Kristin Logan's recent changes Complex patient. Started wellbutrin recently, hopes to transition off celexa if sodium did not stay around 130s which is pt baseline Will f/u with her hospital course Appreciate [facility] help wit patient

## 2013-01-15 NOTE — Progress Notes (Addendum)
[name] PROGRESS NOTE  Kristin Logan ZOX:[id number] DOB: 1932-02-08 DOA: 01/14/2013 PCP: [name], MD  Addendum 1755: Repeat sodium 120. Excellent urine output. Has been able to tolerate some of the clear liquids without vomiting. Denies nausea. No abdominal pain. No complaints currently. Family notes some intermittent delirium today.  Patient alert, oriented to self, family members present, month, hospital, president. Stumbled over a year reporting 2004. Appears calm and comfortable. Abdomen soft, nontender, nondistended. Umbilicus appears unremarkable. Hernia noted and easily reducible. No pain present. Detailed discussion with 2 daughters, daughter-in-law and son at bedside, a questions answered. Fluid challenge in the past has resulted in worsening hyponatremia. Therefore continue fluid restriction given some improvement. Delirium is mild and is expected to wax and wane.  Assessment/Plan: 1. Symptomatic hyponatremia: Improved today with restriction of fluid. Recurrent. Etiology unclear but likely multifactorial including excessive free water intake as well as medication effect, low chloride suggest dehydration but sodium worsened with isotonic saline. Followup TSH, cortisol, urine osmolality, serum osmolality. Abdominal CT unremarkable. Followup chest CT given chronic cough. Discontinued all medications that could cause SIADH including her Celexa, Wellbutrin, Omeprazole, ACEI. Continue fluid restriction and Lasix. Mental status appears stable. No seizures. 2. Nausea, vomiting, lower abdominal pain times one week: Currently asymptomatic. Likely secondary to hyponatremia. Advance diet. She has a history of frequent steroid use for recurrent bronchitis. If Na remains low and K normal, and cortisol low may start empiric solu-cortef.  3. Diabetes mellitus: Stable. Sliding-scale insulin. 4. Eosinophilia: Etiology unclear. Long-standing by history. Consider hematology referral. IgE and  peripheral smear pending. Started Claritin and H2 blocker. 5. Dysuria, Vaginitis, Perineal Maceration/Rash: Long-standing issue per patient and daughter, has been on some kind of vaginal cream for many years. No obvious urine obstruction, good urine output, no hydro on CT or bladder wall thickening, consider outpatient urology evaluation. 6. Atrial fibrillation: Rate controlled. Continue warfarin per pharmacy. 7. Obstructive sleep apnea: Continue CPAP 8. Status post aortic valve replacement: Tissue valve per patient. 9. History right breast cancer 10. History of aneurysm, headache 11. Umbilical hernia: Asymptomatic. Follow clinically. Outpatient referral. 12. Hypothyroidism 13. Chronic pain from her low back DJD, severe knee OA, and pain from prior injuries related to falls at home. 14. Chronic gait instability: Physical therapy evaluation. 15. History of left hip fracture 11/2012: Being treated conservatively for this.  Code Status: Full code Family Communication: Discussed with daughter at bedside Disposition Plan: Home when improved  Brendia Sacks, MD  [name]  Pager [phone] If 7PM-7AM, please contact night-coverage at [email or web address], password [facility] 01/15/2013, 9:16 AM  LOS: 1 day   Brief narrative: 79 y.o. female with a PMH significant for Breast Cancer (declared cancer free), A-Fib on coumadin therapy, Hypothyroidism and Valvular heart disease s/p aortic valve replacement at [facility] in 2011 who presents to the ED c/o apx 1 week history of nausea, vomiting, lethargy and gait instability. In the ED she was found to have a critically low sodium of 116. 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 She was initially given IV fluids which improved her Na but over the course of the next day her sodium trended back down and she  was then placed on fluid restriction and given lasix which then improved her Na to 130. She was discharged 48 hours later on fluid  restriction which according to her family has been religiously maintained, and she has also continued her regular lasix dosing-labs followed by her PCP [name] and have been stable at 130 -last check 12/27/12, which is still fairly low. The unusual part of her prior history is that at the time of her last admission she had significant wheezing with a completely normal CXR, no history of COPD or smoking or asthma, and marked eosinophilia. She was given high dose of steroids in the ED but those were discontinued pending her hyponatremia work up at that time. She also on the prior had very little urine output, painful/difficult urination and family reports significant peripheral edema prior to fluid restriction. For the breathing issues she underwent subsequent PFTs which showed increased airway resistance but no focal airway obstruction, no COPD or reactive airway. Additionally she did not an elevated BNP to suggest CHF and her liver was normal at that time.   Consultants:    Procedures:    HPI/Subjective: Hyponatremia persists but is improved. May have dropped with isotonic fluids which were discontinued. Afebrile, transient bradycardia. Vitals otherwise stable. The patient denies nausea or vomiting. Denies pain. Feeling better. Daughter at bedside reports that possible patient was drinking extra water and patient reports she was drinking extra water at home.  Objective: Filed Vitals:   01/14/13 2345 01/15/13 0440 01/15/13 0500 01/15/13 0846  BP:  106/65    Pulse: 78 39  57  Temp:  97.7 F (36.5 C)    TempSrc:  Axillary    Resp: 20 18    Height:      Weight:   97.9 kg (215 lb 13.3 oz)   SpO2: 95% 95%  93%    Intake/Output Summary (Last 24 hours) at 01/15/13 0916 Last data filed at 01/15/13 0200  Gross per 24 hour  Intake     50 ml  Output   1600 ml  Net   -1550 ml   Filed Weights   01/14/13 2205 01/15/13 0500  Weight: 96.6 kg (212 lb 15.4 oz) 97.9 kg (215 lb 13.3 oz)    Exam:  General:  Appears calm and comfortable Eyes: PERRL, normal lids, irises  ENT: Mildly hard of hearing. Lips and tongue appear unremarkable. Cardiovascular: RRR, 3/6 holosystolic murmur, no r/g. No LE edema. Telemetry: Atrial fibrillation, rate controlled Respiratory: CTA bilaterally, no w/r/r. Normal respiratory effort. Abdomen: soft, ntnd, positive bowel sounds Skin: Hemosiderin staining bilateral lower extremities. Musculoskeletal: grossly normal tone BUE/BLE moves lower extremities to command. Psychiatric: grossly normal mood and affect, speech fluent and appropriate Neurologic: grossly non-focal.  Data Reviewed: Basic Metabolic Panel:  Recent Labs Lab 01/14/13 1549 01/14/13 2215 01/15/13 0110 01/15/13 0321 01/15/13 0543  NA 116* 114* 116* 119* 120*  K 4.0 4.5 4.5 4.4 4.3  CL 81* 81* 83* 85* 86*  CO2 24 22 24 24 25   GLUCOSE 95 94 96 94 85  BUN 4* 4* 4* 4* 4*  CREATININE 0.48* 0.55 0.55 0.62 0.61  CALCIUM 9.2 8.7 8.4 8.6 8.7   Liver Function Tests:  Recent Labs Lab 01/14/13 1549  AST 21  ALT 8  ALKPHOS 72  BILITOT 0.5  PROT 7.2  ALBUMIN 3.7    Recent Labs Lab 01/14/13 1549  LIPASE 18   CBC:  Recent Labs Lab 01/14/13 1549 01/15/13 0543  WBC 8.9 6.7  NEUTROABS 5.1  --   HGB 13.5 12.2  HCT 37.0 34.2*  MCV 78.2 79.9  PLT 267 247   Cardiac Enzymes:  Recent Labs Lab 01/14/13 1604 01/15/13 0543  TROPONINI <0.30 <0.30    Recent Labs  11/11/12 0108 01/14/13 2215  PROBNP 354.6 386.4   CBG:  Recent Labs Lab 01/15/13 0011 01/15/13 0715  GLUCAP 91 76    Studies: Dg Chest 2 View  01/14/2013  *RADIOLOGY REPORT*  Clinical Data: Vomiting.  Weakness.  CHEST - 2 VIEW  Comparison: 02/10, 02/09, and 10/15/2012  Findings: There is chronic cardiomegaly.  Pulmonary vascularity is normal.  Lungs are clear.  No effusions.   No acute osseous abnormality.  IMPRESSION: No acute abnormality.  Chronic cardiomegaly.   Original Report Authenticated By: Francene Boyers, M.D.    Ct Head Wo Contrast  01/14/2013  *RADIOLOGY REPORT*  Clinical Data: Altered mental status  CT HEAD WITHOUT CONTRAST  Technique:  Contiguous axial images were obtained from the base of the skull through the vertex without contrast.  Comparison: 04/02/2012  Findings: No skull fracture is noted.  Paranasal sinuses and mastoid air cells are unremarkable.  No intracranial hemorrhage, mass effect or midline shift.  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  No acute infarction.  No mass lesion is noted on this unenhanced scan.  Stable atrophy.  Stable patchy subcortical chronic white matter disease.  IMPRESSION: No acute intracranial abnormality.  Stable atrophy and chronic white matter disease.  No definite acute cortical infarction.   Original Report Authenticated By: Natasha Mead, M.D.    Ct Abdomen Pelvis W Contrast  01/14/2013  *RADIOLOGY REPORT*  Clinical Data: Vomiting, abdominal pain  CT ABDOMEN AND PELVIS WITH CONTRAST  Technique:  Multidetector CT imaging of the abdomen and pelvis was performed following the standard protocol during bolus administration of intravenous contrast.  Contrast: 50mL OMNIPAQUE IOHEXOL 300 MG/ML  SOLN, OMNIPAQUE IOHEXOL 300 MG/ML  SOLN  Comparison: Lumbar spine x-rays 04/02/2012.  Findings: Lung bases are unremarkable.  Degenerative changes are noted lumbar spine at L5 S1 level.  There is diffuse osteopenia. Stable compression fractures of L1 and L2 vertebral bodies.  Enhanced liver is unremarkable.  Cardiomegaly is noted.  Pancreas, spleen and adrenal glands are unremarkable.  Atherosclerotic calcifications of the abdominal aorta and the iliac arteries.  No aortic aneurysm.  Mild distended gallbladder without pericholecystic fluid.  Probable tiny gallstones within gallbladder the largest  measures 3 mm.  The kidneys are symmetrical in size and enhancement.  Mild lobulated renal contour.  No hydronephrosis or hydroureter.  Delayed renal images shows bilateral renal symmetrical excretion.  There is no small bowel obstruction.  Limited assessment of distal small bowel which is non opacified with contrast.  There is moderate gaseous distention of the transverse colon.  No pericecal inflammation.  The sigmoid colon is empty collapsed. Multiple sigmoid colon diverticula are noted.  No evidence of acute diverticulitis.  The splenic flexure and the colon left colon are empty collapsed.  Atrophic uterus is noted.  The urinary bladder is unremarkable. There is a small umbilical hernia containing a short segment of the anterior wall of the transverse colon without evidence of acute complication.  IMPRESSION:  1.  Degenerative changes lumbar spine.  Diffuse osteopenia.  Stable compression fractures of L1 and L2 vertebral bodies. 2.  Atherosclerotic calcifications of the abdominal aorta and the iliac arteries.  No aortic aneurysm. 3.  Probable tiny gallstones within gallbladder the largest measures 3 mm.  No pericholecystic fluid. 4.  No hydronephrosis or hydroureter. 5.  No bowel obstruction.  Moderate gaseous distention of  the transverse colon.  There is a small umbilical hernia containing short segment of the anterior wall of the transverse colon without evidence of acute inflammation or complication.  6.  Sigmoid colon diverticula are noted.  No evidence of acute diverticulitis. 7.  Cardiomegaly is noted.   Original Report Authenticated By: Natasha Mead, M.D.     Scheduled Meds: . clotrimazole   Topical BID  . famotidine (PEPCID) IV  20 mg Intravenous Q12H  . hydrocortisone   Topical BID  . insulin aspart  0-15 Units Subcutaneous TID WC  . insulin aspart  0-5 Units Subcutaneous QHS  . loratadine  10 mg Oral Daily  . metoprolol succinate  25 mg Oral Daily  . simvastatin  20 mg Oral QHS  . sodium  chloride  3 mL Intravenous Q12H  . Warfarin - Pharmacist Dosing Inpatient   Does not apply q1800   Continuous Infusions:   Principal Problem:   Hyponatremia Active Problems:   DIABETES MELLITUS, TYPE II   HYPERLIPIDEMIA   Eosinophilia   HYPERTENSION   Atrial fibrillation, chronic   GERD   BREAST CANCER, HX OF   Chronic anticoagulation   Sleep apnea   Aortic valve disease   Depression   Hypothyroidism   Gait instability   Difficulty hearing   Brain aneurysm   Compression fracture   Weakness   Osteoarthritis of both knees   Vomiting and Abdominal Pain   Spastic dysuria   Atrophic vaginitis   Chronic cough   Weight loss   History of Concussion   Brendia Sacks, MD  [name] Pager [phone] If 7PM-7AM, please contact night-coverage at [email or web address], password [facility] 01/15/2013, 9:16 AM  LOS: 1 day   Time spent: 35 minutes

## 2013-01-16 ENCOUNTER — Inpatient Hospital Stay (HOSPITAL_COMMUNITY): Payer: Medicare Other

## 2013-01-16 DIAGNOSIS — E871 Hypo-osmolality and hyponatremia: Secondary | ICD-10-CM

## 2013-01-16 DIAGNOSIS — R5383 Other fatigue: Secondary | ICD-10-CM

## 2013-01-16 DIAGNOSIS — R269 Unspecified abnormalities of gait and mobility: Secondary | ICD-10-CM

## 2013-01-16 DIAGNOSIS — D721 Eosinophilia: Secondary | ICD-10-CM

## 2013-01-16 DIAGNOSIS — R5381 Other malaise: Secondary | ICD-10-CM

## 2013-01-16 LAB — GLUCOSE, CAPILLARY
Glucose-Capillary: 84 mg/dL (ref 70–99)
Glucose-Capillary: 95 mg/dL (ref 70–99)

## 2013-01-16 LAB — BASIC METABOLIC PANEL
BUN: 3 mg/dL — ABNORMAL LOW (ref 6–23)
CO2: 27 mEq/L (ref 19–32)
Chloride: 88 mEq/L — ABNORMAL LOW (ref 96–112)
Glucose, Bld: 87 mg/dL (ref 70–99)
Potassium: 3.8 mEq/L (ref 3.5–5.1)
Sodium: 124 mEq/L — ABNORMAL LOW (ref 135–145)

## 2013-01-16 LAB — URINALYSIS, ROUTINE W REFLEX MICROSCOPIC
Leukocytes, UA: NEGATIVE
Nitrite: NEGATIVE
Protein, ur: NEGATIVE mg/dL
Specific Gravity, Urine: 1.02 (ref 1.005–1.030)
Urobilinogen, UA: 1 mg/dL (ref 0.0–1.0)

## 2013-01-16 LAB — URINE MICROSCOPIC-ADD ON

## 2013-01-16 NOTE — Consult Note (Signed)
Reason for Consult: Umbilical hernia Referring Physician: [name]  Kristin Logan is an 79 y.o. female.  HPI: Patient is a 79 year old white female who presented with a history of intermittent abdominal pain, hyponatremia. Patient presented emergency room, a CT scan the abdomen and pelvis was performed which revealed an umbilical hernia with a part of the sidewall the transverse colon present in it. There is no evidence of bowel obstruction. In talking with the family, the patient has had this umbilical hernia for some time now. She is somewhat bedridden do to the need for bilateral knee replacements, which have not been performed do to her overall medical condition. History is limited due to patient's mental disorder.  Past Medical History  Diagnosis Date  . Diabetes mellitus   . Hyperlipidemia   . Eosinophilia   . Cataract   . Hypertension   . Atrial fibrillation prior to 2008    moderate biatrial enlargement in 2009; mild to moderate LVH with a low-normal EF  . GERD (gastroesophageal reflux disease)     Hemorrhoids; history of peptic ulcer disease  . DJD (degenerative joint disease) of knee   . Sleep apnea     CPAP  . Chronic anticoagulation   . Aortic valve disease     2009: mild stenosis and regurgitation; peak gradient of 30-35 mmHg , subsequent AVR at [facility]  . Incontinence     Mild  . Carcinoma of breast     Right mastectomy; radiation and hormonal therapy  . Hip fracture, left 12/12/2012  . Foot injury 06/25/2012  . Degenerative joint disease of knee 01/29/2008    Bilateral    . BREAST CANCER, HX OF 01/29/2008    Lumpectomy, radiation therapy  Arimidex stopped in July 2013    . Syncope and collapse 11/11/2012    Past Surgical History  Procedure Laterality Date  . Mastectomy partial / lumpectomy w/ axillary lymphadenectomy  2008    Right for carcinoma;  . Exploration post operative open heart    . Colonoscopy      Approximately 2000  . Cardiac valve  replacement      [facility]    Family History  Problem Relation Age of Onset  . Heart disease Mother   . Stroke Father   . Cancer Sister   . Heart disease Sister   . Stroke Sister   . Stroke Brother     Social History:  reports that she has never smoked. She does not have any smokeless tobacco history on file. She reports that she does not drink alcohol or use illicit drugs.  Allergies:  Allergies  Allergen Reactions  . Nsaids     Bleeding ulcer  . Codeine Nausea And Vomiting  . Adhesive (Tape) Rash    Redness, and peeling skin off.     Medications: I have reviewed the patient's current medications.  Results for orders placed during the hospital encounter of 01/14/13 (from the past 48 hour(s))  CBC WITH DIFFERENTIAL     Status: Abnormal   Collection Time    01/14/13  3:49 PM      Result Value Range   WBC 8.9  4.0 - 10.5 K/uL   RBC 4.73  3.87 - 5.11 MIL/uL   Hemoglobin 13.5  12.0 - 15.0 g/dL   HCT 16.1  09.6 - 04.5 %   MCV 78.2  78.0 - 100.0 fL   MCH 28.5  26.0 - 34.0 pg   MCHC 36.5 (*) 30.0 - 36.0 g/dL  RDW 14.0  11.5 - 15.5 %   Platelets 267  150 - 400 K/uL   Neutrophils Relative 58  43 - 77 %   Neutro Abs 5.1  1.7 - 7.7 K/uL   Lymphocytes Relative 20  12 - 46 %   Lymphs Abs 1.8  0.7 - 4.0 K/uL   Monocytes Relative 8  3 - 12 %   Monocytes Absolute 0.7  0.1 - 1.0 K/uL   Eosinophils Relative 14 (*) 0 - 5 %   Eosinophils Absolute 1.2 (*) 0.0 - 0.7 K/uL   Basophils Relative 1  0 - 1 %   Basophils Absolute 0.1  0.0 - 0.1 K/uL  COMPREHENSIVE METABOLIC PANEL     Status: Abnormal   Collection Time    01/14/13  3:49 PM      Result Value Range   Sodium 116 (*) 135 - 145 mEq/L   Comment: CRITICAL RESULT CALLED TO, READ BACK BY AND VERIFIED WITH:     FORD,K ON 01/14/13 AT 1615 BY LOY,C   Potassium 4.0  3.5 - 5.1 mEq/L   Chloride 81 (*) 96 - 112 mEq/L   CO2 24  19 - 32 mEq/L   Glucose, Bld 95  70 - 99 mg/dL   BUN 4 (*) 6 - 23 mg/dL   Creatinine, Ser 1.61 (*) 0.50 - 1.10  mg/dL   Calcium 9.2  8.4 - 09.6 mg/dL   Total Protein 7.2  6.0 - 8.3 g/dL   Albumin 3.7  3.5 - 5.2 g/dL   AST 21  0 - 37 U/L   ALT 8  0 - 35 U/L   Alkaline Phosphatase 72  39 - 117 U/L   Total Bilirubin 0.5  0.3 - 1.2 mg/dL   GFR calc non Af Amer >90  >90 mL/min   GFR calc Af Amer >90  >90 mL/min   Comment:            The eGFR has been calculated     using the CKD EPI equation.     This calculation has not been     validated in all clinical     situations.     eGFR's persistently     <90 mL/min signify     possible Chronic Kidney Disease.  LIPASE, BLOOD     Status: None   Collection Time    01/14/13  3:49 PM      Result Value Range   Lipase 18  11 - 59 U/L  TROPONIN I     Status: None   Collection Time    01/14/13  4:04 PM      Result Value Range   Troponin I <0.30  <0.30 ng/mL   Comment:            Due to the release kinetics of cTnI,     a negative result within the first hours     of the onset of symptoms does not rule out     myocardial infarction with certainty.     If myocardial infarction is still suspected,     repeat the test at appropriate intervals.  PROTIME-INR     Status: Abnormal   Collection Time    01/14/13  4:04 PM      Result Value Range   Prothrombin Time 23.6 (*) 11.6 - 15.2 seconds   INR 2.21 (*) 0.00 - 1.49  URINALYSIS, ROUTINE W REFLEX MICROSCOPIC     Status: None   Collection Time  01/14/13  4:14 PM      Result Value Range   Color, Urine YELLOW  YELLOW   APPearance CLEAR  CLEAR   Specific Gravity, Urine 1.020  1.005 - 1.030   pH 6.0  5.0 - 8.0   Glucose, UA NEGATIVE  NEGATIVE mg/dL   Hgb urine dipstick NEGATIVE  NEGATIVE   Bilirubin Urine NEGATIVE  NEGATIVE   Ketones, ur NEGATIVE  NEGATIVE mg/dL   Protein, ur NEGATIVE  NEGATIVE mg/dL   Urobilinogen, UA 0.2  0.0 - 1.0 mg/dL   Nitrite NEGATIVE  NEGATIVE   Leukocytes, UA NEGATIVE  NEGATIVE   Comment: MICROSCOPIC NOT DONE ON URINES WITH NEGATIVE PROTEIN, BLOOD, LEUKOCYTES, NITRITE, OR  GLUCOSE <1000 mg/dL.  URINE CULTURE     Status: None   Collection Time    01/14/13  4:14 PM      Result Value Range   Specimen Description URINE, CATHETERIZED     Special Requests NONE     Culture  Setup Time 01/14/2013 18:50     Colony Count NO GROWTH     Culture NO GROWTH     Report Status 01/15/2013 FINAL    OSMOLALITY     Status: Abnormal   Collection Time    01/14/13 10:15 PM      Result Value Range   Osmolality 245 (*) 275 - 300 mOsm/kg   Comment: Result repeated and verified.  URIC ACID     Status: None   Collection Time    01/14/13 10:15 PM      Result Value Range   Uric Acid, Serum 4.3  2.4 - 7.0 mg/dL  TSH     Status: None   Collection Time    01/14/13 10:15 PM      Result Value Range   TSH 0.383  0.350 - 4.500 uIU/mL  T4, FREE     Status: Abnormal   Collection Time    01/14/13 10:15 PM      Result Value Range   Free T4 0.71 (*) 0.80 - 1.80 ng/dL  PRO B NATRIURETIC PEPTIDE     Status: None   Collection Time    01/14/13 10:15 PM      Result Value Range   Pro B Natriuretic peptide (BNP) 386.4  0 - 450 pg/mL  HEMOGLOBIN A1C     Status: Abnormal   Collection Time    01/14/13 10:15 PM      Result Value Range   Hemoglobin A1C 5.8 (*) <5.7 %   Comment: (NOTE)                                                                               According to the ADA Clinical Practice Recommendations for 2011, when     HbA1c is used as a screening test:      >=6.5%   Diagnostic of Diabetes Mellitus               (if abnormal result is confirmed)     5.7-6.4%   Increased risk of developing Diabetes Mellitus     References:Diagnosis and Classification of Diabetes Mellitus,Diabetes     Care,2011,34(Suppl 1):S62-S69 and Standards of Medical Care in  Diabetes - 2011,Diabetes Care,2011,34 (Suppl 1):S11-S61.   Mean Plasma Glucose 120 (*) <117 mg/dL  BASIC METABOLIC PANEL     Status: Abnormal   Collection Time    01/14/13 10:15 PM      Result Value Range   Sodium  114 (*) 135 - 145 mEq/L   Comment: CRITICAL RESULT CALLED TO, READ BACK BY AND VERIFIED WITH:     T. GARRETT AT 2319 ON 01/13/14 BY S. VANHOORNE   Potassium 4.5  3.5 - 5.1 mEq/L   Chloride 81 (*) 96 - 112 mEq/L   CO2 22  19 - 32 mEq/L   Glucose, Bld 94  70 - 99 mg/dL   BUN 4 (*) 6 - 23 mg/dL   Creatinine, Ser 7.82  0.50 - 1.10 mg/dL   Calcium 8.7  8.4 - 95.6 mg/dL   GFR calc non Af Amer 87 (*) >90 mL/min   GFR calc Af Amer >90  >90 mL/min   Comment:            The eGFR has been calculated     using the CKD EPI equation.     This calculation has not been     validated in all clinical     situations.     eGFR's persistently     <90 mL/min signify     possible Chronic Kidney Disease.  CORTISOL     Status: None   Collection Time    01/14/13 10:16 PM      Result Value Range   Cortisol, Plasma 4.5     Comment: (NOTE)     AM:  4.3 - 22.4 ug/dL     PM:  3.1 - 21.3 ug/dL  SODIUM, URINE, RANDOM     Status: None   Collection Time    01/14/13 10:25 PM      Result Value Range   Sodium, Ur <10    OSMOLALITY, URINE     Status: Abnormal   Collection Time    01/14/13 10:25 PM      Result Value Range   Osmolality, Ur 221 (*) 390 - 1090 mOsm/kg  IGE     Status: None   Collection Time    01/15/13 12:02 AM      Result Value Range   IgE (Immunoglobulin E), Serum 22.0  0.0 - 180.0 IU/mL  GLUCOSE, CAPILLARY     Status: None   Collection Time    01/15/13 12:11 AM      Result Value Range   Glucose-Capillary 91  70 - 99 mg/dL  BASIC METABOLIC PANEL     Status: Abnormal   Collection Time    01/15/13  1:10 AM      Result Value Range   Sodium 116 (*) 135 - 145 mEq/L   Comment: DELTA CHECK NOTED     CRITICAL RESULT CALLED TO, READ BACK BY AND VERIFIED WITH:     GARRETT T AT 0150 ON [id number] BY FORSYTH K   Potassium 4.5  3.5 - 5.1 mEq/L   Chloride 83 (*) 96 - 112 mEq/L   CO2 24  19 - 32 mEq/L   Glucose, Bld 96  70 - 99 mg/dL   BUN 4 (*) 6 - 23 mg/dL   Creatinine, Ser 4.69  0.50 - 1.10  mg/dL   Calcium 8.4  8.4 - 62.9 mg/dL   GFR calc non Af Amer 87 (*) >90 mL/min   GFR calc Af Amer >90  >90 mL/min   Comment:  The eGFR has been calculated     using the CKD EPI equation.     This calculation has not been     validated in all clinical     situations.     eGFR's persistently     <90 mL/min signify     possible Chronic Kidney Disease.  BASIC METABOLIC PANEL     Status: Abnormal   Collection Time    01/15/13  3:21 AM      Result Value Range   Sodium 119 (*) 135 - 145 mEq/L   Comment: CRITICAL RESULT CALLED TO, READ BACK BY AND VERIFIED WITH:     GARRETT,T AT 5:35AM ON 01/15/13 BY FESTERMAN,C   Potassium 4.4  3.5 - 5.1 mEq/L   Chloride 85 (*) 96 - 112 mEq/L   CO2 24  19 - 32 mEq/L   Glucose, Bld 94  70 - 99 mg/dL   BUN 4 (*) 6 - 23 mg/dL   Creatinine, Ser 7.82  0.50 - 1.10 mg/dL   Calcium 8.6  8.4 - 95.6 mg/dL   GFR calc non Af Amer 84 (*) >90 mL/min   GFR calc Af Amer >90  >90 mL/min   Comment:            The eGFR has been calculated     using the CKD EPI equation.     This calculation has not been     validated in all clinical     situations.     eGFR's persistently     <90 mL/min signify     possible Chronic Kidney Disease.  BASIC METABOLIC PANEL     Status: Abnormal   Collection Time    01/15/13  5:43 AM      Result Value Range   Sodium 120 (*) 135 - 145 mEq/L   Potassium 4.3  3.5 - 5.1 mEq/L   Chloride 86 (*) 96 - 112 mEq/L   CO2 25  19 - 32 mEq/L   Glucose, Bld 85  70 - 99 mg/dL   BUN 4 (*) 6 - 23 mg/dL   Creatinine, Ser 2.13  0.50 - 1.10 mg/dL   Calcium 8.7  8.4 - 08.6 mg/dL   GFR calc non Af Amer 84 (*) >90 mL/min   GFR calc Af Amer >90  >90 mL/min   Comment:            The eGFR has been calculated     using the CKD EPI equation.     This calculation has not been     validated in all clinical     situations.     eGFR's persistently     <90 mL/min signify     possible Chronic Kidney Disease.  CBC     Status: Abnormal    Collection Time    01/15/13  5:43 AM      Result Value Range   WBC 6.7  4.0 - 10.5 K/uL   RBC 4.28  3.87 - 5.11 MIL/uL   Hemoglobin 12.2  12.0 - 15.0 g/dL   HCT 57.8 (*) 46.9 - 62.9 %   MCV 79.9  78.0 - 100.0 fL   MCH 28.5  26.0 - 34.0 pg   MCHC 35.7  30.0 - 36.0 g/dL   RDW 52.8  41.3 - 24.4 %   Platelets 247  150 - 400 K/uL  PROTIME-INR     Status: Abnormal   Collection Time    01/15/13  5:43 AM  Result Value Range   Prothrombin Time 25.5 (*) 11.6 - 15.2 seconds   INR 2.46 (*) 0.00 - 1.49  TROPONIN I     Status: None   Collection Time    01/15/13  5:43 AM      Result Value Range   Troponin I <0.30  <0.30 ng/mL   Comment:            Due to the release kinetics of cTnI,     a negative result within the first hours     of the onset of symptoms does not rule out     myocardial infarction with certainty.     If myocardial infarction is still suspected,     repeat the test at appropriate intervals.  GLUCOSE, CAPILLARY     Status: None   Collection Time    01/15/13  7:15 AM      Result Value Range   Glucose-Capillary 76  70 - 99 mg/dL  GLUCOSE, CAPILLARY     Status: None   Collection Time    01/15/13 11:38 AM      Result Value Range   Glucose-Capillary 83  70 - 99 mg/dL  BASIC METABOLIC PANEL     Status: Abnormal   Collection Time    01/15/13  1:59 PM      Result Value Range   Sodium 120 (*) 135 - 145 mEq/L   Potassium 4.3  3.5 - 5.1 mEq/L   Chloride 86 (*) 96 - 112 mEq/L   CO2 26  19 - 32 mEq/L   Glucose, Bld 88  70 - 99 mg/dL   BUN 4 (*) 6 - 23 mg/dL   Creatinine, Ser 1.61  0.50 - 1.10 mg/dL   Calcium 8.6  8.4 - 09.6 mg/dL   GFR calc non Af Amer 83 (*) >90 mL/min   GFR calc Af Amer >90  >90 mL/min   Comment:            The eGFR has been calculated     using the CKD EPI equation.     This calculation has not been     validated in all clinical     situations.     eGFR's persistently     <90 mL/min signify     possible Chronic Kidney Disease.  GLUCOSE,  CAPILLARY     Status: None   Collection Time    01/15/13  4:31 PM      Result Value Range   Glucose-Capillary 81  70 - 99 mg/dL  GLUCOSE, CAPILLARY     Status: Abnormal   Collection Time    01/15/13  9:12 PM      Result Value Range   Glucose-Capillary 101 (*) 70 - 99 mg/dL  PROTIME-INR     Status: Abnormal   Collection Time    01/16/13  5:30 AM      Result Value Range   Prothrombin Time 30.8 (*) 11.6 - 15.2 seconds   INR 3.17 (*) 0.00 - 1.49  BASIC METABOLIC PANEL     Status: Abnormal   Collection Time    01/16/13  5:30 AM      Result Value Range   Sodium 124 (*) 135 - 145 mEq/L   Potassium 3.8  3.5 - 5.1 mEq/L   Chloride 88 (*) 96 - 112 mEq/L   CO2 27  19 - 32 mEq/L   Glucose, Bld 87  70 - 99 mg/dL   BUN 3 (*) 6 -  23 mg/dL   Creatinine, Ser 4.09  0.50 - 1.10 mg/dL   Calcium 8.7  8.4 - 81.1 mg/dL   GFR calc non Af Amer 89 (*) >90 mL/min   GFR calc Af Amer >90  >90 mL/min   Comment:            The eGFR has been calculated     using the CKD EPI equation.     This calculation has not been     validated in all clinical     situations.     eGFR's persistently     <90 mL/min signify     possible Chronic Kidney Disease.  GLUCOSE, CAPILLARY     Status: None   Collection Time    01/16/13  7:18 AM      Result Value Range   Glucose-Capillary 84  70 - 99 mg/dL   Comment 1 Documented in Chart     Comment 2 Notify RN    URINALYSIS, ROUTINE W REFLEX MICROSCOPIC     Status: Abnormal   Collection Time    01/16/13 11:20 AM      Result Value Range   Color, Urine YELLOW  YELLOW   APPearance CLEAR  CLEAR   Specific Gravity, Urine 1.020  1.005 - 1.030   pH 6.5  5.0 - 8.0   Glucose, UA NEGATIVE  NEGATIVE mg/dL   Hgb urine dipstick SMALL (*) NEGATIVE   Bilirubin Urine SMALL (*) NEGATIVE   Ketones, ur 15 (*) NEGATIVE mg/dL   Protein, ur NEGATIVE  NEGATIVE mg/dL   Urobilinogen, UA 1.0  0.0 - 1.0 mg/dL   Nitrite NEGATIVE  NEGATIVE   Leukocytes, UA NEGATIVE  NEGATIVE  URINE  MICROSCOPIC-ADD ON     Status: None   Collection Time    01/16/13 11:20 AM      Result Value Range   Squamous Epithelial / LPF RARE  RARE   RBC / HPF 3-6  <3 RBC/hpf   Bacteria, UA RARE  RARE  GLUCOSE, CAPILLARY     Status: None   Collection Time    01/16/13 11:40 AM      Result Value Range   Glucose-Capillary 95  70 - 99 mg/dL   Comment 1 Documented in Chart     Comment 2 Notify RN      Dg Chest 2 View  01/14/2013  *RADIOLOGY REPORT*  Clinical Data: Vomiting.  Weakness.  CHEST - 2 VIEW  Comparison: 02/10, 02/09, and 10/15/2012  Findings: There is chronic cardiomegaly.  Pulmonary vascularity is normal.  Lungs are clear.  No effusions.  No acute osseous abnormality.  IMPRESSION: No acute abnormality.  Chronic cardiomegaly.   Original Report Authenticated By: Francene Boyers, M.D.    Ct Head Wo Contrast  01/14/2013  *RADIOLOGY REPORT*  Clinical Data: Altered mental status  CT HEAD WITHOUT CONTRAST  Technique:  Contiguous axial images were obtained from the base of the skull through the vertex without contrast.  Comparison: 04/02/2012  Findings: No skull fracture is noted.  Paranasal sinuses and mastoid air cells are unremarkable.  No intracranial hemorrhage, mass effect or midline shift.  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  No acute infarction.  No mass lesion is noted on this unenhanced scan.  Stable atrophy.  Stable patchy subcortical chronic white matter disease.  IMPRESSION: No acute intracranial abnormality.  Stable atrophy and chronic white matter disease.  No definite acute cortical infarction.   Original Report  Authenticated By: Natasha Mead, M.D.    Ct Chest W Contrast  01/15/2013  *RADIOLOGY REPORT*  Clinical Data: Cough.  Back pain.  CT CHEST WITH CONTRAST  Technique:  Multidetector CT imaging of the chest was performed following the standard protocol during bolus administration of intravenous contrast.  Contrast: 80mL OMNIPAQUE IOHEXOL 300  MG/ML  SOLN  Comparison: Chest radiograph 01/15/2003.  Findings: Cardiomegaly is present with the right atrial and left atrial enlargement.  Aortic valve replacement is present with median sternotomy.  Coronary artery atherosclerosis.  Mitral calcification is present.  There is no pericardial effusion.  Small hiatal hernia and patulous thoracic esophagus.  There is no axillary adenopathy.  Right axillary nodal dissection.  Precarinal lymph node is borderline, measuring 1 cm.  No hilar adenopathy.  Incidental visualization of the upper abdomen is within normal limits.  Partially visualized L1 superior endplate compression fracture.  Mid thoracic compression fracture at T6, with about 25% loss of vertebral body height, probably chronic.  Extensive aortic atherosclerosis.  No aneurysm.  The lungs demonstrate atelectasis and scarring.  7 mm ground-glass attenuation nodule in the left upper lobe (image number 15 series 3).  Central airways appear patent.  No aggressive osseous lesions. No convincing evidence of bronchial wall thickening.  IMPRESSION: 1.  No acute abnormality. 2.  Cardiomegaly with atrial enlargement. 3.  Ground-glass attenuation 7 mm right upper lobe pulmonary nodule.  Initial follow-up by chest CT without contrast is recommended in 3 months to confirm persistence.   This recommendation follows the consensus statement: Recommendations for the Management of Subsolid Pulmonary Nodules Detected at CT:  A Statement from the Fleischner Society as published in Radiology 2013; 266:304-317.   Original Report Authenticated By: Andreas Newport, M.D.    Ct Abdomen Pelvis W Contrast  01/14/2013  *RADIOLOGY REPORT*  Clinical Data: Vomiting, abdominal pain  CT ABDOMEN AND PELVIS WITH CONTRAST  Technique:  Multidetector CT imaging of the abdomen and pelvis was performed following the standard protocol during bolus administration of intravenous contrast.  Contrast: 50mL OMNIPAQUE IOHEXOL 300 MG/ML  SOLN,  OMNIPAQUE IOHEXOL 300 MG/ML  SOLN  Comparison: Lumbar spine x-rays 04/02/2012.  Findings: Lung bases are unremarkable.  Degenerative changes are noted lumbar spine at L5 S1 level.  There is diffuse osteopenia. Stable compression fractures of L1 and L2 vertebral bodies.  Enhanced liver is unremarkable.  Cardiomegaly is noted.  Pancreas, spleen and adrenal glands are unremarkable.  Atherosclerotic calcifications of the abdominal aorta and the iliac arteries.  No aortic aneurysm.  Mild distended gallbladder without pericholecystic fluid.  Probable tiny gallstones within gallbladder the largest measures 3 mm.  The kidneys are symmetrical in size and enhancement.  Mild lobulated renal contour.  No hydronephrosis or hydroureter.  Delayed renal images shows bilateral renal symmetrical excretion.  There is no small bowel obstruction.  Limited assessment of distal small bowel which is non opacified with contrast.  There is moderate gaseous distention of the transverse colon.  No pericecal inflammation.  The sigmoid colon is empty collapsed. Multiple sigmoid colon diverticula are noted.  No evidence of acute diverticulitis.  The splenic flexure and the colon left colon are empty collapsed.  Atrophic uterus is noted.  The urinary bladder is unremarkable. There is a small umbilical hernia containing a short segment of the anterior wall of the transverse colon without evidence of acute complication.  IMPRESSION:  1.  Degenerative changes lumbar spine.  Diffuse osteopenia.  Stable compression fractures of L1 and L2 vertebral bodies.  2.  Atherosclerotic calcifications of the abdominal aorta and the iliac arteries.  No aortic aneurysm. 3.  Probable tiny gallstones within gallbladder the largest measures 3 mm.  No pericholecystic fluid. 4.  No hydronephrosis or hydroureter. 5.  No bowel obstruction.  Moderate gaseous distention of the transverse colon.  There is a small umbilical hernia containing short segment of the anterior wall  of the transverse colon without evidence of acute inflammation or complication.  6.  Sigmoid colon diverticula are noted.  No evidence of acute diverticulitis. 7.  Cardiomegaly is noted.   Original Report Authenticated By: Natasha Mead, M.D.     ROS: See chart Blood pressure 137/66, pulse 64, temperature 97.8 F (36.6 C), temperature source Oral, resp. rate 18, height 5' 5.5" (1.664 m), weight 97.9 kg (215 lb 13.3 oz), SpO2 96.00%. Physical Exam: Pleasant obese white female in no acute distress. Abdomen is soft, nontender, nondistended. And easily reducible umbilical hernia is present, measuring 1.5-2 cm in its greatest diameter. No bowel is present at the time of examination in the hernia.  Assessment/Plan: Impression: Umbilical hernia, asymptomatic at this point, no evidence of bowel obstruction. Plan: The patient has multiple comorbidities which make her a high risk surgery candidate for general anesthesia. At this point, the risks of the surgery outweigh the benefits of repair the umbilical hernia. This was discussed extensively with the patient's family, who agree and do not want any surgical intervention unless absolutely necessary. Signs and symptoms of incarceration of the umbilical hernia were discussed with the family. Will follow peripherally with you.  Theia Dezeeuw A 01/16/2013, 3:40 PM

## 2013-01-16 NOTE — Progress Notes (Signed)
ANTICOAGULATION CONSULT NOTE - Initial Consult  Pharmacy Consult for Coumadin (chronic PTA) Indication: atrial fibrillation  Allergies  Allergen Reactions  . Nsaids     Bleeding ulcer  . Codeine Nausea And Vomiting  . Adhesive (Tape) Rash    Redness, and peeling skin off.    Patient Measurements: Height: 5' 5.5" (166.4 cm) Weight: 215 lb 13.3 oz (97.9 kg) IBW/kg (Calculated) : 58.15  Vital Signs: Temp: 98.2 F (36.8 C) (04/16 2114) BP: 131/61 mmHg (04/16 2114) Pulse Rate: 71 (04/17 0844)  Labs:  Recent Labs  01/14/13 1549 01/14/13 1604  01/15/13 0543 01/15/13 1359 01/16/13 0530  HGB 13.5  --   --  12.2  --   --   HCT 37.0  --   --  34.2*  --   --   PLT 267  --   --  247  --   --   LABPROT  --  23.6*  --  25.5*  --  30.8*  INR  --  2.21*  --  2.46*  --  3.17*  CREATININE 0.48*  --   < > 0.61 0.64 0.51  TROPONINI  --  <0.30  --  <0.30  --   --   < > = values in this interval not displayed.  Estimated Creatinine Clearance: 66.7 ml/min (by C-G formula based on Cr of 0.51).  Medical History: Past Medical History  Diagnosis Date  . Diabetes mellitus   . Hyperlipidemia   . Eosinophilia   . Cataract   . Hypertension   . Atrial fibrillation prior to 2008    moderate biatrial enlargement in 2009; mild to moderate LVH with a low-normal EF  . GERD (gastroesophageal reflux disease)     Hemorrhoids; history of peptic ulcer disease  . DJD (degenerative joint disease) of knee   . Sleep apnea     CPAP  . Chronic anticoagulation   . Aortic valve disease     2009: mild stenosis and regurgitation; peak gradient of 30-35 mmHg , subsequent AVR at [facility]  . Incontinence     Mild  . Carcinoma of breast     Right mastectomy; radiation and hormonal therapy  . Hip fracture, left 12/12/2012  . Foot injury 06/25/2012  . Degenerative joint disease of knee 01/29/2008    Bilateral    . BREAST CANCER, HX OF 01/29/2008    Lumpectomy, radiation therapy  Arimidex stopped in July 2013     . Syncope and collapse 11/11/2012   Medications:  Prescriptions prior to admission  Medication Sig Dispense Refill  . acetaminophen (TYLENOL) 650 MG CR tablet Take 1,300 mg by mouth every 8 (eight) hours as needed for pain.      Marland Kitchen albuterol (PROVENTIL HFA;VENTOLIN HFA) 108 (90 BASE) MCG/ACT inhaler Inhale 2 puffs into the lungs every 4 (four) hours as needed for wheezing.  1 Inhaler  2  . albuterol (PROVENTIL) (2.5 MG/3ML) 0.083% nebulizer solution Take 3 mLs (2.5 mg total) by nebulization every 6 (six) hours as needed for wheezing.  75 mL  3  . amLODipine (NORVASC) 5 MG tablet Take 5 mg by mouth daily.      Marland Kitchen buPROPion (WELLBUTRIN) 75 MG tablet Take 0.5 tablets (37.5 mg total) by mouth 2 (two) times daily.  30 tablet  2  . calcium carbonate (OS-CAL) 600 MG TABS Take 600 mg by mouth daily.      . clotrimazole-betamethasone (LOTRISONE) cream Apply 1 application topically 2 (two) times daily.      Marland Kitchen  diclofenac sodium (VOLTAREN) 1 % GEL Apply 2 g topically daily as needed (Pain).      . furosemide (LASIX) 40 MG tablet Take 1 tablet (40 mg total) by mouth daily. For fluid  30 tablet  6  . glimepiride (AMARYL) 1 MG tablet Take 1 mg by mouth daily before breakfast.      . HYDROcodone-acetaminophen (NORCO) 5-325 MG per tablet Take 1 tablet by mouth every 6 (six) hours as needed for pain.  60 tablet  2  . levothyroxine (SYNTHROID, LEVOTHROID) 50 MCG tablet Take 50 mcg by mouth daily.      Marland Kitchen lisinopril (PRINIVIL,ZESTRIL) 40 MG tablet Take 40 mg by mouth daily.      . metoprolol succinate (TOPROL-XL) 25 MG 24 hr tablet Take 25 mg by mouth daily.      . Multiple Vitamin (MULTIVITAMIN) tablet Take 1 tablet by mouth daily.      Marland Kitchen omeprazole (PRILOSEC) 20 MG capsule Take 1 capsule (20 mg total) by mouth 2 (two) times daily. For stomach acid  60 capsule  6  . polyethylene glycol (MIRALAX / GLYCOLAX) packet Take 17 g by mouth 2 (two) times daily.      . potassium chloride SA (K-DUR,KLOR-CON) 20 MEQ tablet  Take 1 tablet (20 mEq total) by mouth daily. For potassium and fluid pill  30 tablet  6  . promethazine (PHENERGAN) 12.5 MG tablet Take 12.5 mg by mouth every 6 (six) hours as needed for nausea.      . simvastatin (ZOCOR) 20 MG tablet Take 1 tablet (20 mg total) by mouth at bedtime.  30 tablet  6  . warfarin (COUMADIN) 5 MG tablet Take 5-10 mg by mouth daily. Takes 5 mg every day except on Tues when she takes 10 mg.       Assessment: 79yo female on chronic Coumadin PTA.  Pt is maintained on Coumadin 5mg  daily.  INR is rising and is supra-therapeutic today.    Goal of Therapy:  INR 2-3 Monitor platelets by anticoagulation protocol: Yes   Plan: HOLD Coumadin today INR daily   Margo Aye, Malvika Tung A 01/16/2013,8:57 AM

## 2013-01-16 NOTE — Care Management Note (Addendum)
    Page 1 of 2   01/21/2013     5:01:24 PM   CARE MANAGEMENT NOTE 01/21/2013  Patient:  Kristin Logan, Kristin Logan   Account Number:  [id number]  Date Initiated:  01/16/2013  Documentation initiated by:  Rosemary Holms  Subjective/Objective Assessment:   Pt admitted from home where she lives with children. Currently has a CAP aide followed by [facility].     Action/Plan:   Anticipated DC Date:  01/21/2013   Anticipated DC Plan:  HOME W HOME HEALTH SERVICES      DC Planning Services  CM consult      Choice offered to / List presented to:          [facility] arranged  HH-1 RN      [facility] agency  [facility]   Status of service:  Completed, signed off Medicare Important Message given?  YES (If response is "NO", the following Medicare IM given date fields will be blank) Date Medicare IM given:  01/21/2013 Date Additional Medicare IM given:    Discharge Disposition:  HOME W HOME HEALTH SERVICES  Per UR Regulation:    If discussed at Long Length of Stay Meetings, dates discussed:   01/21/2013    Comments:  01/21/13 Rosemary Holms RN BSN CM Family requested a wheel chair to borrow to take pt home.. CM offered to order one but she currently has an order in for a hover-round. CM asked charge nurse who stated that they could not borrow one. EMS offered to daughter who declined stating that she would get friends to assist pt into the home.  01/20/13 Rosemary Holms RN BSN CM Pt and family hopeful for DC tomorrow. HH RN accepted and [facility] requested.  01/16/13 Rosemary Holms RN BNS CM If additional services needs, will require a new HH order and Face to Face.

## 2013-01-16 NOTE — Progress Notes (Addendum)
[name] PROGRESS NOTE  Kristin Logan:[id number] DOB: July 10, 1932 DOA: 01/14/2013 PCP: [name], MD  Addendum: Has had a good day. Ate very well at lunch. Sensorium clearer.  Assessment/Plan: 1. Symptomatic hyponatremia: Sodium continues to improve with fluid restriction. Excellent diuresis. Likely multifactorial including excessive water intake, SIADH effect from Celexa, possibly omeprazole or ACE inhibitor though less likely. Sodium dropped in response to isotonic saline 4/16. CT of the chest abdomen and pelvis unremarkable with no evidence of malignancy. Delirium persists. 2. Nausea, vomiting, lower abdominal pain times one week: Currently asymptomatic with no recurrence of pain or vomiting. She was able to drink some liquids last night. Likely secondary to hyponatremia. Advance diet. Followup cortisol.  3. Acute encephalopathy, delirium: Presumed secondary to hyponatremia. Recheck urinalysis. Nonfocal neurologic exam. 4. Diabetes mellitus: Stable. Sliding-scale insulin. 5. Eosinophilia: Etiology unclear. Long-standing by history.IgE normal and peripheral smear pending. Started Claritin and H2 blocker. 6. Dysuria, Vaginitis, Perineal Maceration/Rash: Long-standing issue per patient and daughter, has been on some kind of vaginal cream for many years. No obvious urine obstruction, good urine output, no hydro on CT or bladder wall thickening, consider outpatient urology evaluation. 7. Atrial fibrillation: Rate controlled. Continue warfarin per pharmacy. 8. Obstructive sleep apnea: Continue CPAP 9. Status post aortic valve replacement: Tissue valve per daughter. 10. History right breast cancer 11. History of aneurysm, headache 12. Umbilical hernia: Asymptomatic. Follow clinically. Outpatient referral. Again 4/17 13. Hypothyroidism 14. Chronic pain from her low back DJD, severe knee OA, and pain from prior injuries related to falls at home. 15. Chronic gait instability:  Physical therapy evaluation. 16. History of left hip fracture 11/2012: Being treated conservatively for this.  Code Status: Full code Family Communication: Discussed with daughter at bedside again 4/17 Disposition Plan: Home when improved  Brendia Sacks, MD  [name]  Pager [phone] If 7PM-7AM, please contact night-coverage at [email or web address], password [facility] 01/16/2013, 8:25 AM  LOS: 2 days   Brief narrative: 79 y.o. female with a PMH significant for Breast Cancer (declared cancer free), A-Fib on coumadin therapy, Hypothyroidism and Valvular heart disease s/p aortic valve replacement at [facility] in 2011 who presents to the ED c/o apx 1 week history of nausea, vomiting, lethargy and gait instability. In the ED she was found to have a critically low sodium of 116. 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 She was initially given IV fluids which improved her Na but over the course of the next day her sodium trended back down and she was then placed on fluid restriction and given lasix which then improved her Na to 130. She was discharged 48 hours later on fluid restriction which according to her family has been religiously maintained, and she has also continued her regular lasix dosing-labs followed by her PCP [name] and have been stable at 130 -last check 12/27/12, which is still fairly low. The unusual part of her prior history is that at the time of her last admission she had significant wheezing with a completely normal CXR, no history of COPD or smoking or asthma, and marked eosinophilia. She was given high dose of steroids in the ED but those were discontinued pending her hyponatremia work up at that time. She also on the prior had very little urine output, painful/difficult urination and family reports significant peripheral  edema  prior to fluid restriction. For the breathing issues she underwent subsequent PFTs which showed increased airway resistance but no focal airway obstruction, no COPD or reactive airway. Additionally she did not an elevated BNP to suggest CHF and her liver was normal at that time.   Consultants:    Procedures:    HPI/Subjective: Discussed with daughter at bedside he spent the night. Delirium waxing waning, evidence of response to internal stimuli. No nausea or vomiting. No complaints of pain. Confusion persisted. Eventually rested with a dose of Ativan.  Objective: Filed Vitals:   01/15/13 0846 01/15/13 1417 01/15/13 1420 01/15/13 2114  BP:   138/75 131/61  Pulse: 57  62 73  Temp:   97.7 F (36.5 C) 98.2 F (36.8 C)  TempSrc:      Resp:   18 20  Height:      Weight:      SpO2: 93% 96% 96% 95%    Intake/Output Summary (Last 24 hours) at 01/16/13 0825 Last data filed at 01/15/13 2249  Gross per 24 hour  Intake    150 ml  Output      0 ml  Net    150 ml   Filed Weights   01/14/13 2205 01/15/13 0500  Weight: 96.6 kg (212 lb 15.4 oz) 97.9 kg (215 lb 13.3 oz)    Exam:  General:  Appears calm and comfortable, sleeping but easily arouses to voice Cardiovascular: RRR, 3/6 holosystolic murmur, no r/g. No LE edema. Telemetry: Atrial fibrillation, rate controlled Respiratory: CTA bilaterally, no w/r/r. Normal respiratory effort. Abdomen: soft, ntnd, positive bowel sounds. Umbilical hernia noted, nontender, easily reduced. Skin: Hemosiderin staining bilateral lower extremities. Musculoskeletal: grossly normal tone BUE/BLE Psychiatric: grossly normal  Neurologic: grossly non-focal.  Data Reviewed: Basic Metabolic Panel:  Recent Labs Lab 01/15/13 0110 01/15/13 0321 01/15/13 0543 01/15/13 1359 01/16/13 0530  NA 116* 119* 120* 120* 124*  K 4.5 4.4 4.3 4.3 3.8  CL 83* 85* 86* 86* 88*  CO2 24 24 25 26 27   GLUCOSE 96 94 85 88 87  BUN 4* 4* 4* 4* 3*  CREATININE 0.55 0.62  0.61 0.64 0.51  CALCIUM 8.4 8.6 8.7 8.6 8.7   Liver Function Tests:  Recent Labs Lab 01/14/13 1549  AST 21  ALT 8  ALKPHOS 72  BILITOT 0.5  PROT 7.2  ALBUMIN 3.7    Recent Labs Lab 01/14/13 1549  LIPASE 18   CBC:  Recent Labs Lab 01/14/13 1549 01/15/13 0543  WBC 8.9 6.7  NEUTROABS 5.1  --   HGB 13.5 12.2  HCT 37.0 34.2*  MCV 78.2 79.9  PLT 267 247   Cardiac Enzymes:  Recent Labs Lab 01/14/13 1604 01/15/13 0543  TROPONINI <0.30 <0.30    Recent Labs  11/11/12 0108 01/14/13 2215  PROBNP 354.6 386.4   CBG:  Recent Labs Lab 01/15/13 0715 01/15/13 1138 01/15/13 1631 01/15/13 2112 01/16/13 0718  GLUCAP 76 83 81 101* 84    Studies: Dg Chest 2 View  01/14/2013  *RADIOLOGY REPORT*  Clinical Data: Vomiting.  Weakness.  CHEST - 2 VIEW  Comparison: 02/10, 02/09, and 10/15/2012  Findings: There is chronic cardiomegaly.  Pulmonary vascularity is normal.  Lungs are clear.  No effusions.  No acute osseous abnormality.  IMPRESSION: No acute abnormality.  Chronic cardiomegaly.   Original Report Authenticated By: Francene Boyers, M.D.    Ct Head Wo Contrast  01/14/2013  *RADIOLOGY REPORT*  Clinical Data: Altered mental status  CT HEAD WITHOUT  CONTRAST  Technique:  Contiguous axial images were obtained from the base of the skull through the vertex without contrast.  Comparison: 04/02/2012  Findings: No skull fracture is noted.  Paranasal sinuses and mastoid air cells are unremarkable.  No intracranial hemorrhage, mass effect or midline shift.  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  No acute infarction.  No mass lesion is noted on this unenhanced scan.  Stable atrophy.  Stable patchy subcortical chronic white matter disease.  IMPRESSION: No acute intracranial abnormality.  Stable atrophy and chronic white matter disease.  No definite acute cortical infarction.   Original Report Authenticated By: Natasha Mead, M.D.    Ct Chest W  Contrast  01/15/2013  *RADIOLOGY REPORT*  Clinical Data: Cough.  Back pain.  CT CHEST WITH CONTRAST  Technique:  Multidetector CT imaging of the chest was performed following the standard protocol during bolus administration of intravenous contrast.  Contrast: 80mL OMNIPAQUE IOHEXOL 300 MG/ML  SOLN  Comparison: Chest radiograph 01/15/2003.  Findings: Cardiomegaly is present with the right atrial and left atrial enlargement.  Aortic valve replacement is present with median sternotomy.  Coronary artery atherosclerosis.  Mitral calcification is present.  There is no pericardial effusion.  Small hiatal hernia and patulous thoracic esophagus.  There is no axillary adenopathy.  Right axillary nodal dissection.  Precarinal lymph node is borderline, measuring 1 cm.  No hilar adenopathy.  Incidental visualization of the upper abdomen is within normal limits.  Partially visualized L1 superior endplate compression fracture.  Mid thoracic compression fracture at T6, with about 25% loss of vertebral body height, probably chronic.  Extensive aortic atherosclerosis.  No aneurysm.  The lungs demonstrate atelectasis and scarring.  7 mm ground-glass attenuation nodule in the left upper lobe (image number 15 series 3).  Central airways appear patent.  No aggressive osseous lesions. No convincing evidence of bronchial wall thickening.  IMPRESSION: 1.  No acute abnormality. 2.  Cardiomegaly with atrial enlargement. 3.  Ground-glass attenuation 7 mm right upper lobe pulmonary nodule.  Initial follow-up by chest CT without contrast is recommended in 3 months to confirm persistence.   This recommendation follows the consensus statement: Recommendations for the Management of Subsolid Pulmonary Nodules Detected at CT:  A Statement from the Fleischner Society as published in Radiology 2013; 266:304-317.   Original Report Authenticated By: Andreas Newport, M.D.    Ct Abdomen Pelvis W Contrast  01/14/2013  *RADIOLOGY REPORT*  Clinical Data:  Vomiting, abdominal pain  CT ABDOMEN AND PELVIS WITH CONTRAST  Technique:  Multidetector CT imaging of the abdomen and pelvis was performed following the standard protocol during bolus administration of intravenous contrast.  Contrast: 50mL OMNIPAQUE IOHEXOL 300 MG/ML  SOLN, OMNIPAQUE IOHEXOL 300 MG/ML  SOLN  Comparison: Lumbar spine x-rays 04/02/2012.  Findings: Lung bases are unremarkable.  Degenerative changes are noted lumbar spine at L5 S1 level.  There is diffuse osteopenia. Stable compression fractures of L1 and L2 vertebral bodies.  Enhanced liver is unremarkable.  Cardiomegaly is noted.  Pancreas, spleen and adrenal glands are unremarkable.  Atherosclerotic calcifications of the abdominal aorta and the iliac arteries.  No aortic aneurysm.  Mild distended gallbladder without pericholecystic fluid.  Probable tiny gallstones within gallbladder the largest measures 3 mm.  The kidneys are symmetrical in size and enhancement.  Mild lobulated renal contour.  No hydronephrosis or hydroureter.  Delayed renal images shows bilateral renal symmetrical excretion.  There is no small bowel obstruction.  Limited  assessment of distal small bowel which is non opacified with contrast.  There is moderate gaseous distention of the transverse colon.  No pericecal inflammation.  The sigmoid colon is empty collapsed. Multiple sigmoid colon diverticula are noted.  No evidence of acute diverticulitis.  The splenic flexure and the colon left colon are empty collapsed.  Atrophic uterus is noted.  The urinary bladder is unremarkable. There is a small umbilical hernia containing a short segment of the anterior wall of the transverse colon without evidence of acute complication.  IMPRESSION:  1.  Degenerative changes lumbar spine.  Diffuse osteopenia.  Stable compression fractures of L1 and L2 vertebral bodies. 2.  Atherosclerotic calcifications of the abdominal aorta and the iliac arteries.  No aortic aneurysm. 3.  Probable tiny  gallstones within gallbladder the largest measures 3 mm.  No pericholecystic fluid. 4.  No hydronephrosis or hydroureter. 5.  No bowel obstruction.  Moderate gaseous distention of the transverse colon.  There is a small umbilical hernia containing short segment of the anterior wall of the transverse colon without evidence of acute inflammation or complication.  6.  Sigmoid colon diverticula are noted.  No evidence of acute diverticulitis. 7.  Cardiomegaly is noted.   Original Report Authenticated By: Natasha Mead, M.D.     Scheduled Meds: . clotrimazole   Topical BID  . famotidine (PEPCID) IV  20 mg Intravenous Q12H  . furosemide  40 mg Oral Daily  . hydrocortisone   Topical BID  . insulin aspart  0-15 Units Subcutaneous TID WC  . insulin aspart  0-5 Units Subcutaneous QHS  . loratadine  10 mg Oral Daily  . metoprolol succinate  25 mg Oral Daily  . simvastatin  20 mg Oral QHS  . sodium chloride  3 mL Intravenous Q12H  . warfarin  5 mg Oral Q24H  . Warfarin - Pharmacist Dosing Inpatient   Does not apply q1800   Continuous Infusions:   Principal Problem:   Hyponatremia Active Problems:   DIABETES MELLITUS, TYPE II   HYPERLIPIDEMIA   Eosinophilia   HYPERTENSION   Atrial fibrillation, chronic   GERD   BREAST CANCER, HX OF   Chronic anticoagulation   Sleep apnea   Aortic valve disease   Depression   Hypothyroidism   Gait instability   Difficulty hearing   Brain aneurysm   Compression fracture   Weakness   Osteoarthritis of both knees   Vomiting and Abdominal Pain   Spastic dysuria   Atrophic vaginitis   Chronic cough   Weight loss   History of Concussion   Brendia Sacks, MD  [name] Pager [phone] If 7PM-7AM, please contact night-coverage at [email or web address], password [email or web address] 01/16/2013, 8:25 AM  LOS: 2 days   Time spent: 30 minutes

## 2013-01-17 LAB — GLUCOSE, CAPILLARY
Glucose-Capillary: 90 mg/dL (ref 70–99)
Glucose-Capillary: 99 mg/dL (ref 70–99)
Glucose-Capillary: 106 mg/dL — ABNORMAL HIGH (ref 70–99)

## 2013-01-17 LAB — BASIC METABOLIC PANEL
CO2: 26 mEq/L (ref 19–32)
Glucose, Bld: 86 mg/dL (ref 70–99)
Potassium: 3.7 mEq/L (ref 3.5–5.1)
Sodium: 122 mEq/L — ABNORMAL LOW (ref 135–145)

## 2013-01-17 LAB — SODIUM, URINE, RANDOM: Sodium, Ur: 14 mEq/L

## 2013-01-17 LAB — OSMOLALITY, URINE: Osmolality, Ur: 503 mOsm/kg (ref 390–1090)

## 2013-01-17 MED ORDER — SODIUM CHLORIDE 1 G PO TABS
1.0000 g | ORAL_TABLET | Freq: Two times a day (BID) | ORAL | Status: DC
  Administered 2013-01-17 – 2013-01-20 (×6): 1 g via ORAL
  Filled 2013-01-17 (×6): qty 1

## 2013-01-17 NOTE — Consult Note (Signed)
Reason for Consult: Hyponatremia Referring Physician: Dr. Kern Kristin Logan is an 79 y.o. female.  HPI: She is a patient who has history of breast cancer, a trial fibrillation, hypothyroidism and presently came with complaints of weakness, increased lethargy and dizziness of one week duration. According to the patient's family patient was feeling weak and unable to get out of bed. Since her condition continued to get worse patient was brought to the emergency room and she was found to have sodium of 116. Patient had similar episode previously and that during that time her sodium responded to free water restriction and Lasix. She has  a workup done which showed her urine osmolality 252 unable to find urine sodium or serum osmolality. At that time possibility of SIADH was entertained. After her discharge her sodium has remained around  130 for some time. . presently patient states that she's feeling better. She doesn't have any nausea but still she has some back pain. She denies any difficulty breathing.  Past Medical History  Diagnosis Date  . Diabetes mellitus   . Hyperlipidemia   . Eosinophilia   . Cataract   . Hypertension   . Atrial fibrillation prior to 2008    moderate biatrial enlargement in 2009; mild to moderate LVH with a low-normal EF  . GERD (gastroesophageal reflux disease)     Hemorrhoids; history of peptic ulcer disease  . DJD (degenerative joint disease) of knee   . Sleep apnea     CPAP  . Chronic anticoagulation   . Aortic valve disease     2009: mild stenosis and regurgitation; peak gradient of 30-35 mmHg , subsequent AVR at [facility]  . Incontinence     Mild  . Carcinoma of breast     Right mastectomy; radiation and hormonal therapy  . Hip fracture, left 12/12/2012  . Foot injury 06/25/2012  . Degenerative joint disease of knee 01/29/2008    Bilateral    . BREAST CANCER, HX OF 01/29/2008    Lumpectomy, radiation therapy  Arimidex stopped in July 2013    . Syncope  and collapse 11/11/2012    Past Surgical History  Procedure Laterality Date  . Mastectomy partial / lumpectomy w/ axillary lymphadenectomy  2008    Right for carcinoma;  . Exploration post operative open heart    . Colonoscopy      Approximately 2000  . Cardiac valve replacement      [facility]    Family History  Problem Relation Age of Onset  . Heart disease Mother   . Stroke Father   . Cancer Sister   . Heart disease Sister   . Stroke Sister   . Stroke Brother     Social History:  reports that she has never smoked. She does not have any smokeless tobacco history on file. She reports that she does not drink alcohol or use illicit drugs.  Allergies:  Allergies  Allergen Reactions  . Nsaids     Bleeding ulcer  . Codeine Nausea And Vomiting  . Adhesive (Tape) Rash    Redness, and peeling skin off.     Medications: I have reviewed the patient's current medications.  Results for orders placed during the hospital encounter of 01/14/13 (from the past 48 hour(s))  GLUCOSE, CAPILLARY     Status: None   Collection Time    01/15/13 11:38 AM      Result Value Range   Glucose-Capillary 83  70 - 99 mg/dL  BASIC METABOLIC PANEL  Status: Abnormal   Collection Time    01/15/13  1:59 PM      Result Value Range   Sodium 120 (*) 135 - 145 mEq/L   Potassium 4.3  3.5 - 5.1 mEq/L   Chloride 86 (*) 96 - 112 mEq/L   CO2 26  19 - 32 mEq/L   Glucose, Bld 88  70 - 99 mg/dL   BUN 4 (*) 6 - 23 mg/dL   Creatinine, Ser 4.54  0.50 - 1.10 mg/dL   Calcium 8.6  8.4 - 09.8 mg/dL   GFR calc non Af Amer 83 (*) >90 mL/min   GFR calc Af Amer >90  >90 mL/min   Comment:            The eGFR has been calculated     using the CKD EPI equation.     This calculation has not been     validated in all clinical     situations.     eGFR's persistently     <90 mL/min signify     possible Chronic Kidney Disease.  GLUCOSE, CAPILLARY     Status: None   Collection Time    01/15/13  4:31 PM      Result  Value Range   Glucose-Capillary 81  70 - 99 mg/dL  GLUCOSE, CAPILLARY     Status: Abnormal   Collection Time    01/15/13  9:12 PM      Result Value Range   Glucose-Capillary 101 (*) 70 - 99 mg/dL  PROTIME-INR     Status: Abnormal   Collection Time    01/16/13  5:30 AM      Result Value Range   Prothrombin Time 30.8 (*) 11.6 - 15.2 seconds   INR 3.17 (*) 0.00 - 1.49  BASIC METABOLIC PANEL     Status: Abnormal   Collection Time    01/16/13  5:30 AM      Result Value Range   Sodium 124 (*) 135 - 145 mEq/L   Potassium 3.8  3.5 - 5.1 mEq/L   Chloride 88 (*) 96 - 112 mEq/L   CO2 27  19 - 32 mEq/L   Glucose, Bld 87  70 - 99 mg/dL   BUN 3 (*) 6 - 23 mg/dL   Creatinine, Ser 1.19  0.50 - 1.10 mg/dL   Calcium 8.7  8.4 - 14.7 mg/dL   GFR calc non Af Amer 89 (*) >90 mL/min   GFR calc Af Amer >90  >90 mL/min   Comment:            The eGFR has been calculated     using the CKD EPI equation.     This calculation has not been     validated in all clinical     situations.     eGFR's persistently     <90 mL/min signify     possible Chronic Kidney Disease.  GLUCOSE, CAPILLARY     Status: None   Collection Time    01/16/13  7:18 AM      Result Value Range   Glucose-Capillary 84  70 - 99 mg/dL   Comment 1 Documented in Chart     Comment 2 Notify RN    URINALYSIS, ROUTINE W REFLEX MICROSCOPIC     Status: Abnormal   Collection Time    01/16/13 11:20 AM      Result Value Range   Color, Urine YELLOW  YELLOW   APPearance CLEAR  CLEAR  Specific Gravity, Urine 1.020  1.005 - 1.030   pH 6.5  5.0 - 8.0   Glucose, UA NEGATIVE  NEGATIVE mg/dL   Hgb urine dipstick SMALL (*) NEGATIVE   Bilirubin Urine SMALL (*) NEGATIVE   Ketones, ur 15 (*) NEGATIVE mg/dL   Protein, ur NEGATIVE  NEGATIVE mg/dL   Urobilinogen, UA 1.0  0.0 - 1.0 mg/dL   Nitrite NEGATIVE  NEGATIVE   Leukocytes, UA NEGATIVE  NEGATIVE  URINE MICROSCOPIC-ADD ON     Status: None   Collection Time    01/16/13 11:20 AM      Result  Value Range   Squamous Epithelial / LPF RARE  RARE   RBC / HPF 3-6  <3 RBC/hpf   Bacteria, UA RARE  RARE  GLUCOSE, CAPILLARY     Status: None   Collection Time    01/16/13 11:40 AM      Result Value Range   Glucose-Capillary 95  70 - 99 mg/dL   Comment 1 Documented in Chart     Comment 2 Notify RN    GLUCOSE, CAPILLARY     Status: Abnormal   Collection Time    01/16/13  4:19 PM      Result Value Range   Glucose-Capillary 111 (*) 70 - 99 mg/dL   Comment 1 Documented in Chart     Comment 2 Notify RN    GLUCOSE, CAPILLARY     Status: Abnormal   Collection Time    01/16/13  9:28 PM      Result Value Range   Glucose-Capillary 106 (*) 70 - 99 mg/dL  PROTIME-INR     Status: Abnormal   Collection Time    01/17/13  5:32 AM      Result Value Range   Prothrombin Time 33.4 (*) 11.6 - 15.2 seconds   INR 3.54 (*) 0.00 - 1.49  BASIC METABOLIC PANEL     Status: Abnormal   Collection Time    01/17/13  5:32 AM      Result Value Range   Sodium 122 (*) 135 - 145 mEq/L   Potassium 3.7  3.5 - 5.1 mEq/L   Chloride 86 (*) 96 - 112 mEq/L   CO2 26  19 - 32 mEq/L   Glucose, Bld 86  70 - 99 mg/dL   BUN 4 (*) 6 - 23 mg/dL   Creatinine, Ser 1.61 (*) 0.50 - 1.10 mg/dL   Calcium 8.6  8.4 - 09.6 mg/dL   GFR calc non Af Amer >90  >90 mL/min   GFR calc Af Amer >90  >90 mL/min   Comment:            The eGFR has been calculated     using the CKD EPI equation.     This calculation has not been     validated in all clinical     situations.     eGFR's persistently     <90 mL/min signify     possible Chronic Kidney Disease.  GLUCOSE, CAPILLARY     Status: None   Collection Time    01/17/13  7:34 AM      Result Value Range   Glucose-Capillary 90  70 - 99 mg/dL   Comment 1 Notify RN      Ct Chest W Contrast  01/15/2013  *RADIOLOGY REPORT*  Clinical Data: Cough.  Back pain.  CT CHEST WITH CONTRAST  Technique:  Multidetector CT imaging of the chest was performed following the standard protocol  during  bolus administration of intravenous contrast.  Contrast: 80mL OMNIPAQUE IOHEXOL 300 MG/ML  SOLN  Comparison: Chest radiograph 01/15/2003.  Findings: Cardiomegaly is present with the right atrial and left atrial enlargement.  Aortic valve replacement is present with median sternotomy.  Coronary artery atherosclerosis.  Mitral calcification is present.  There is no pericardial effusion.  Small hiatal hernia and patulous thoracic esophagus.  There is no axillary adenopathy.  Right axillary nodal dissection.  Precarinal lymph node is borderline, measuring 1 cm.  No hilar adenopathy.  Incidental visualization of the upper abdomen is within normal limits.  Partially visualized L1 superior endplate compression fracture.  Mid thoracic compression fracture at T6, with about 25% loss of vertebral body height, probably chronic.  Extensive aortic atherosclerosis.  No aneurysm.  The lungs demonstrate atelectasis and scarring.  7 mm ground-glass attenuation nodule in the left upper lobe (image number 15 series 3).  Central airways appear patent.  No aggressive osseous lesions. No convincing evidence of bronchial wall thickening.  IMPRESSION: 1.  No acute abnormality. 2.  Cardiomegaly with atrial enlargement. 3.  Ground-glass attenuation 7 mm right upper lobe pulmonary nodule.  Initial follow-up by chest CT without contrast is recommended in 3 months to confirm persistence.   This recommendation follows the consensus statement: Recommendations for the Management of Subsolid Pulmonary Nodules Detected at CT:  A Statement from the Fleischner Society as published in Radiology 2013; 266:304-317.   Original Report Authenticated By: Andreas Newport, M.D.    Dg Abd 2 Views  01/16/2013  *RADIOLOGY REPORT*  Clinical Data: Nausea.  Vomiting.  Evaluate bowel gas pattern.  ABDOMEN - 2 VIEW  Comparison: CT of 01/14/2013  Findings: Upright supine views.  Both are degraded by patient body habitus.  The upright view demonstrates no free  intraperitoneal air.  Numerous leads project over the abdomen.  No definite air- fluid levels; the pelvis is excluded from the upright view.  The supine view demonstrates gas and contrast throughout the colon. There is central lower abdominal lucency which could be within the sigmoid colon.  Small bowel dilatation cannot be entirely excluded. No pneumatosis.  Distal gas identified.  IMPRESSION: Nonspecific bowel gas pattern.  Gas is felt to primarily be within the colon.  Cannot exclude small bowel dilatation centrally.  This would suggest a component of adynamic ileus versus low grade partial small bowel obstruction.  Consider short-term radiographic follow-up.   Original Report Authenticated By: Jeronimo Greaves, M.D.     Review of Systems  Unable to perform ROS Constitutional: Negative for chills and weight loss.  Respiratory: Positive for cough. Negative for sputum production and shortness of breath.   Cardiovascular: Positive for leg swelling. Negative for orthopnea.  Gastrointestinal: Positive for nausea and abdominal pain.  Musculoskeletal: Positive for back pain.  Neurological: Positive for dizziness and weakness.   Blood pressure 156/74, pulse 57, temperature 97.4 F (36.3 C), temperature source Oral, resp. rate 18, height 5' 5.5" (1.664 m), weight 94.7 kg (208 lb 12.4 oz), SpO2 95.00%. Physical Exam  Vitals reviewed. Constitutional: She is oriented to person, place, and time. No distress.  Eyes: No scleral icterus.  Neck: JVD present.  Cardiovascular: Normal rate and regular rhythm.   Murmur heard. Respiratory: No respiratory distress. She has no wheezes. She has rales.  GI: She exhibits no distension. There is no tenderness.  Musculoskeletal: She exhibits no edema.  Neurological: She is alert and oriented to person, place, and time.    Assessment/Plan: Problem #1 hyponatremia: Presently  her sodium is 122 and urine osmolality 221 with urine sodium of less than 10. Presently patient  doesn't have any sign of fluid overload. The etiology for her hyponatremia seems to be hypervolemic hyponatremia. As this moment SIADH cannot completely ruled out as patient has some nausea which can contribute to her hyponatremia by syringe and ADH. Problem #2 history of hypertension blood pressure reasonably controlled Problem #3 after fibrillation Problem #4 history of hypothyroidism Problem #5 history of sleep apnea Problem #6 valvular heart disease she status post aortic valve replacement. Problem #7 GERD Plan: Will liberalize her salt intake We'll start her on sodium 1 g by mouth twice a day We'll continue with freewater restriction. We'll continue his Lasix. We'll check her basic metabolic panel in the morning.  Kristin Logan S 01/17/2013, 9:58 AM

## 2013-01-17 NOTE — Progress Notes (Signed)
ANTICOAGULATION CONSULT NOTE   Pharmacy Consult for Coumadin (chronic PTA) Indication: atrial fibrillation  Allergies  Allergen Reactions  . Nsaids     Bleeding ulcer  . Codeine Nausea And Vomiting  . Adhesive (Tape) Rash    Redness, and peeling skin off.    Patient Measurements: Height: 5' 5.5" (166.4 cm) Weight: 208 lb 12.4 oz (94.7 kg) IBW/kg (Calculated) : 58.15  Vital Signs: Temp: 97.4 F (36.3 C) (04/18 0405) Temp src: Oral (04/18 0405) BP: 156/74 mmHg (04/18 0957) Pulse Rate: 57 (04/18 0742)  Labs:  Recent Labs  01/14/13 1549  01/14/13 1604  01/15/13 0543 01/15/13 1359 01/16/13 0530 01/17/13 0532  HGB 13.5  --   --   --  12.2  --   --   --   HCT 37.0  --   --   --  34.2*  --   --   --   PLT 267  --   --   --  247  --   --   --   LABPROT  --   < > 23.6*  --  25.5*  --  30.8* 33.4*  INR  --   < > 2.21*  --  2.46*  --  3.17* 3.54*  CREATININE 0.48*  --   --   < > 0.61 0.64 0.51 0.48*  TROPONINI  --   --  <0.30  --  <0.30  --   --   --   < > = values in this interval not displayed.  Estimated Creatinine Clearance: 65.5 ml/min (by C-G formula based on Cr of 0.48).  Medical History: Past Medical History  Diagnosis Date  . Diabetes mellitus   . Hyperlipidemia   . Eosinophilia   . Cataract   . Hypertension   . Atrial fibrillation prior to 2008    moderate biatrial enlargement in 2009; mild to moderate LVH with a low-normal EF  . GERD (gastroesophageal reflux disease)     Hemorrhoids; history of peptic ulcer disease  . DJD (degenerative joint disease) of knee   . Sleep apnea     CPAP  . Chronic anticoagulation   . Aortic valve disease     2009: mild stenosis and regurgitation; peak gradient of 30-35 mmHg , subsequent AVR at [facility]  . Incontinence     Mild  . Carcinoma of breast     Right mastectomy; radiation and hormonal therapy  . Hip fracture, left 12/12/2012  . Foot injury 06/25/2012  . Degenerative joint disease of knee 01/29/2008    Bilateral     . BREAST CANCER, HX OF 01/29/2008    Lumpectomy, radiation therapy  Arimidex stopped in July 2013    . Syncope and collapse 11/11/2012   Medications:  Prescriptions prior to admission  Medication Sig Dispense Refill  . acetaminophen (TYLENOL) 650 MG CR tablet Take 1,300 mg by mouth every 8 (eight) hours as needed for pain.      Marland Kitchen albuterol (PROVENTIL HFA;VENTOLIN HFA) 108 (90 BASE) MCG/ACT inhaler Inhale 2 puffs into the lungs every 4 (four) hours as needed for wheezing.  1 Inhaler  2  . albuterol (PROVENTIL) (2.5 MG/3ML) 0.083% nebulizer solution Take 3 mLs (2.5 mg total) by nebulization every 6 (six) hours as needed for wheezing.  75 mL  3  . amLODipine (NORVASC) 5 MG tablet Take 5 mg by mouth daily.      Marland Kitchen buPROPion (WELLBUTRIN) 75 MG tablet Take 0.5 tablets (37.5 mg total) by mouth 2 (  two) times daily.  30 tablet  2  . calcium carbonate (OS-CAL) 600 MG TABS Take 600 mg by mouth daily.      . clotrimazole-betamethasone (LOTRISONE) cream Apply 1 application topically 2 (two) times daily.      . diclofenac sodium (VOLTAREN) 1 % GEL Apply 2 g topically daily as needed (Pain).      . furosemide (LASIX) 40 MG tablet Take 1 tablet (40 mg total) by mouth daily. For fluid  30 tablet  6  . glimepiride (AMARYL) 1 MG tablet Take 1 mg by mouth daily before breakfast.      . HYDROcodone-acetaminophen (NORCO) 5-325 MG per tablet Take 1 tablet by mouth every 6 (six) hours as needed for pain.  60 tablet  2  . levothyroxine (SYNTHROID, LEVOTHROID) 50 MCG tablet Take 50 mcg by mouth daily.      Marland Kitchen lisinopril (PRINIVIL,ZESTRIL) 40 MG tablet Take 40 mg by mouth daily.      . metoprolol succinate (TOPROL-XL) 25 MG 24 hr tablet Take 25 mg by mouth daily.      . Multiple Vitamin (MULTIVITAMIN) tablet Take 1 tablet by mouth daily.      Marland Kitchen omeprazole (PRILOSEC) 20 MG capsule Take 1 capsule (20 mg total) by mouth 2 (two) times daily. For stomach acid  60 capsule  6  . polyethylene glycol (MIRALAX / GLYCOLAX) packet  Take 17 g by mouth 2 (two) times daily.      . potassium chloride SA (K-DUR,KLOR-CON) 20 MEQ tablet Take 1 tablet (20 mEq total) by mouth daily. For potassium and fluid pill  30 tablet  6  . promethazine (PHENERGAN) 12.5 MG tablet Take 12.5 mg by mouth every 6 (six) hours as needed for nausea.      . simvastatin (ZOCOR) 20 MG tablet Take 1 tablet (20 mg total) by mouth at bedtime.  30 tablet  6  . warfarin (COUMADIN) 5 MG tablet Take 5-10 mg by mouth daily. Takes 5 mg every day except on Tues when she takes 10 mg.       Assessment: 79yo female on chronic Coumadin PTA.  Pt is maintained on Coumadin 5mg  daily.  INR is rising and is supra-therapeutic today.    Goal of Therapy:  INR 2-3 Monitor platelets by anticoagulation protocol: Yes   Plan: HOLD Coumadin today INR daily   Margo Aye, Amber Williard A 01/17/2013,10:27 AM

## 2013-01-17 NOTE — Progress Notes (Signed)
[name] PROGRESS NOTE  Kristin Logan EAV:[id number] DOB: [birth year] DOA: 01/14/2013 PCP: [name], MD  Assessment/Plan: 1. Symptomatic hyponatremia: Slightly lower today. Sensorium clear to acute confusion appears resolved. No nausea or vomiting. Eating well. Likely multifactorial including excessive water intake, SIADH effect from Celexa, possibly omeprazole or ACE inhibitor though less likely. Discussed with nephrology--appreciate consultation and recommendations. Sodium dropped in response to isotonic saline 4/16. CT of the chest abdomen and pelvis unremarkable with no evidence of malignancy.  2. Nausea, vomiting, lower abdominal pain times one week: Resolved. Likely secondary to hyponatremia. Advance diet. 3. Acute encephalopathy, delirium: Presumed secondary to hyponatremia. Appears resolved. 4. Diabetes mellitus: Well controlled. Sliding-scale insulin. 5. Eosinophilia: Etiology unclear. Long-standing by history. IgE normal. Started Claritin and H2 blocker. Followup as outpatient. 6. Dysuria, Vaginitis, Perineal Maceration/Rash: Long-standing issue per patient and daughter, has been on some kind of vaginal cream for many years. No obvious urine obstruction, good urine output, no hydro on CT or bladder wall thickening, consider outpatient urology evaluation. 7. Atrial fibrillation: Rate controlled. Continue warfarin per pharmacy. 8. Obstructive sleep apnea: Continue CPAP 9. Status post aortic valve replacement: Tissue valve per daughter. 10. History right breast cancer 11. History of aneurysm, headache 12. Umbilical hernia: Asymptomatic. Follow clinically. Surgical consultation appreciated. 13. Hypothyroidism: Stable. TSH normal. 14. Chronic pain from her low back DJD, severe knee OA, and pain from prior injuries related to falls at home. 15. Chronic gait instability: Physical therapy evaluation. 16. History of left hip fracture 11/2012: Being treated conservatively for  this.  Code Status: Full code Family Communication: Discussed with daughter at bedside again 4/18, she is pleased with improvement. Disposition Plan: Home when improved  Brendia Sacks, MD  [name]  Pager [phone] If 7PM-7AM, please contact night-coverage at [email or web address], password [email or web address] 01/17/2013, 1:23 PM  LOS: 3 days   Brief narrative: 79 year old woman presented with nausea, vomiting and was admitted for hyponatremia.  Consultants:  Nephrology  Physical therapy: No followup needed.  Procedures:    HPI/Subjective: Much improved. No nausea or vomiting. No abdominal pain. Eating well. Daughter reports confusion better.  Objective: Filed Vitals:   01/16/13 2039 01/17/13 0405 01/17/13 0742 01/17/13 0957  BP: 118/68 129/72  156/74  Pulse: 58 55 57   Temp: 97.6 F (36.4 C) 97.4 F (36.3 C)    TempSrc: Axillary Oral    Resp: 17 18    Height:      Weight:  94.7 kg (208 lb 12.4 oz)    SpO2: 91% 94% 95%     Intake/Output Summary (Last 24 hours) at 01/17/13 1323 Last data filed at 01/17/13 0700  Gross per 24 hour  Intake    300 ml  Output    850 ml  Net   -550 ml   Filed Weights   01/14/13 2205 01/15/13 0500 01/17/13 0405  Weight: 96.6 kg (212 lb 15.4 oz) 97.9 kg (215 lb 13.3 oz) 94.7 kg (208 lb 12.4 oz)    Exam:  General:  Appears calm and comfortable, sitting up in chair eating lunch. Very alert. Cardiovascular: RRR, 3/6 holosystolic murmur, no r/g.  Telemetry: Atrial fibrillation, rate controlled Respiratory: CTA bilaterally, no w/r/r. Normal respiratory effort. Psychiatric: grossly normal. Oriented to location, month, year.   Data Reviewed: Basic Metabolic Panel:  Recent Labs Lab 01/15/13 0321 01/15/13 0543 01/15/13 1359 01/16/13 0530 01/17/13 0532  NA 119* 120* 120* 124* 122*  K 4.4 4.3 4.3 3.8 3.7  CL 85* 86* 86* 88* 86*  CO2  24 25 26 27 26   GLUCOSE 94 85 88 87 86  BUN 4* 4* 4* 3* 4*  CREATININE 0.62 0.61 0.64 0.51 0.48*   CALCIUM 8.6 8.7 8.6 8.7 8.6   Liver Function Tests:  Recent Labs Lab 01/14/13 1549  AST 21  ALT 8  ALKPHOS 72  BILITOT 0.5  PROT 7.2  ALBUMIN 3.7    Recent Labs Lab 01/14/13 1549  LIPASE 18   CBC:  Recent Labs Lab 01/14/13 1549 01/15/13 0543  WBC 8.9 6.7  NEUTROABS 5.1  --   HGB 13.5 12.2  HCT 37.0 34.2*  MCV 78.2 79.9  PLT 267 247   Cardiac Enzymes:  Recent Labs Lab 01/14/13 1604 01/15/13 0543  TROPONINI <0.30 <0.30    Recent Labs  11/11/12 0108 01/14/13 2215  PROBNP 354.6 386.4   CBG:  Recent Labs Lab 01/16/13 1140 01/16/13 1619 01/16/13 2128 01/17/13 0734 01/17/13 1137  GLUCAP 95 111* 106* 90 110*    Studies: Ct Chest W Contrast  01/15/2013  *RADIOLOGY REPORT*  Clinical Data: Cough.  Back pain.  CT CHEST WITH CONTRAST  Technique:  Multidetector CT imaging of the chest was performed following the standard protocol during bolus administration of intravenous contrast.  Contrast: 80mL OMNIPAQUE IOHEXOL 300 MG/ML  SOLN  Comparison: Chest radiograph 01/15/2003.  Findings: Cardiomegaly is present with the right atrial and left atrial enlargement.  Aortic valve replacement is present with median sternotomy.  Coronary artery atherosclerosis.  Mitral calcification is present.  There is no pericardial effusion.  Small hiatal hernia and patulous thoracic esophagus.  There is no axillary adenopathy.  Right axillary nodal dissection.  Precarinal lymph node is borderline, measuring 1 cm.  No hilar adenopathy.  Incidental visualization of the upper abdomen is within normal limits.  Partially visualized L1 superior endplate compression fracture.  Mid thoracic compression fracture at T6, with about 25% loss of vertebral body height, probably chronic.  Extensive aortic atherosclerosis.  No aneurysm.  The lungs demonstrate atelectasis and scarring.  7 mm ground-glass attenuation nodule in the left upper lobe (image number 15 series 3).  Central airways appear patent.   No aggressive osseous lesions. No convincing evidence of bronchial wall thickening.  IMPRESSION: 1.  No acute abnormality. 2.  Cardiomegaly with atrial enlargement. 3.  Ground-glass attenuation 7 mm right upper lobe pulmonary nodule.  Initial follow-up by chest CT without contrast is recommended in 3 months to confirm persistence.   This recommendation follows the consensus statement: Recommendations for the Management of Subsolid Pulmonary Nodules Detected at CT:  A Statement from the Fleischner Society as published in Radiology 2013; 266:304-317.   Original Report Authenticated By: Andreas Newport, M.D.    Dg Abd 2 Views  01/16/2013  *RADIOLOGY REPORT*  Clinical Data: Nausea.  Vomiting.  Evaluate bowel gas pattern.  ABDOMEN - 2 VIEW  Comparison: CT of 01/14/2013  Findings: Upright supine views.  Both are degraded by patient body habitus.  The upright view demonstrates no free intraperitoneal air.  Numerous leads project over the abdomen.  No definite air- fluid levels; the pelvis is excluded from the upright view.  The supine view demonstrates gas and contrast throughout the colon. There is central lower abdominal lucency which could be within the sigmoid colon.  Small bowel dilatation cannot be entirely excluded. No pneumatosis.  Distal gas identified.  IMPRESSION: Nonspecific bowel gas pattern.  Gas is felt to primarily be within the colon.  Cannot exclude small bowel dilatation centrally.  This would suggest  a component of adynamic ileus versus low grade partial small bowel obstruction.  Consider short-term radiographic follow-up.   Original Report Authenticated By: Jeronimo Greaves, M.D.     Scheduled Meds: . clotrimazole   Topical BID  . furosemide  40 mg Oral Daily  . hydrocortisone   Topical BID  . insulin aspart  0-15 Units Subcutaneous TID WC  . insulin aspart  0-5 Units Subcutaneous QHS  . loratadine  10 mg Oral Daily  . metoprolol succinate  25 mg Oral Daily  . simvastatin  20 mg Oral QHS  .  sodium chloride  3 mL Intravenous Q12H  . sodium chloride  1 g Oral BID WC  . Warfarin - Pharmacist Dosing Inpatient   Does not apply q1800   Continuous Infusions:   Principal Problem:   Hyponatremia Active Problems:   DIABETES MELLITUS, TYPE II   HYPERLIPIDEMIA   Eosinophilia   HYPERTENSION   Atrial fibrillation, chronic   GERD   BREAST CANCER, HX OF   Chronic anticoagulation   Sleep apnea   Aortic valve disease   Depression   Hypothyroidism   Gait instability   Difficulty hearing   Brain aneurysm   Compression fracture   Weakness   Osteoarthritis of both knees   Vomiting and Abdominal Pain   Spastic dysuria   Atrophic vaginitis   Chronic cough   Weight loss   History of Concussion   Brendia Sacks, MD  [name] Pager [phone] If 7PM-7AM, please contact night-coverage at [email or web address], password [facility] 01/17/2013, 1:23 PM  LOS: 3 days   Time spent: 15 minutes

## 2013-01-17 NOTE — Evaluation (Signed)
Physical Therapy Evaluation Patient Details Name: Kristin Logan MRN: [id number] DOB: 06/02/1932 Today's Date: 01/17/2013 Time: 6295-2841 PT Time Calculation (min): 45 min  PT Assessment / Plan / Recommendation Clinical Impression  Pt was seen for evaluationa and found to be at prior functional level.  She is essentially non ambulatory due to end stage DJD of both knees, lives with daughter and has a CAP aide through the week.  Per daughter, they are managing fairly well.  I instructed the daughter in general LE ROM exercise in order to help pt maintain joint integrity and provided her with written instructions.    PT Assessment  Patent does not need any further PT services    Follow Up Recommendations  No PT follow up    Does the patient have the potential to tolerate intense rehabilitation      Barriers to Discharge        Equipment Recommendations  None recommended by PT    Recommendations for Other Services     Frequency      Precautions / Restrictions Precautions Precautions: Fall Restrictions Weight Bearing Restrictions: No   Pertinent Vitals/Pain       Mobility  Bed Mobility Bed Mobility: Supine to Sit Supine to Sit: 3: Mod assist;HOB elevated Transfers Transfers: Stand Pivot Transfers Stand Pivot Transfers: 5: Supervision Details for Transfer Assistance: pt able to take a few steps with walker to transfer bed to chair Ambulation/Gait Ambulation/Gait Assistance: Not tested (comment) (non ambulatory)    Exercises     PT Diagnosis:    PT Problem List:   PT Treatment Interventions:     PT Goals    Visit Information  Last PT Received On: 01/17/13    Subjective Data  Subjective: no c/o Patient Stated Goal: return home   Prior Functioning  Home Living Lives With: Daughter Available Help at Discharge: Family;Available 24 hours/day Type of Home: House Home Access: Ramped entrance Home Layout: One level Bathroom Toilet: Standard Home Adaptive  Equipment: Bedside commode/3-in-1;Hospital bed;Straight cane;Walker - rolling;Wheelchair - manual Additional Comments: pt is trying to get a power w/c Prior Function Level of Independence: Needs assistance Needs Assistance: Bathing;Dressing;Grooming;Toileting;Meal Prep;Light Housekeeping;Transfers Bath: Moderate Dressing: Moderate Grooming: Minimal Toileting: Moderate Meal Prep: Total Light Housekeeping: Total Transfer Assistance: SBA for bed to chair transfer Able to Take Stairs?: No Driving: No Vocation: Retired Musician: No difficulties    Copywriter, advertising Arousal/Alertness: Awake/alert Behavior During Therapy: WFL for tasks assessed/performed Overall Cognitive Status: Within Functional Limits for tasks assessed    Extremity/Trunk Assessment Right Lower Extremity Assessment RLE ROM/Strength/Tone: Deficits RLE ROM/Strength/Tone Deficits: strength 3-/5 with limited knee flexion due to OA RLE Sensation: History of peripheral neuropathy Left Lower Extremity Assessment LLE ROM/Strength/Tone: Deficits LLE ROM/Strength/Tone Deficits: strength 3-/5 with limited knee flexion due to OA LLE Sensation: History of peripheral neuropathy Trunk Assessment Trunk Assessment: Kyphotic   Balance Balance Balance Assessed: No  End of Session PT - End of Session Equipment Utilized During Treatment: Gait belt Activity Tolerance: Patient tolerated treatment well Patient left: in chair;with call bell/phone within reach;with family/visitor present Nurse Communication: Mobility status  GP     Konrad Penta 01/17/2013, 1:03 PM

## 2013-01-17 NOTE — Progress Notes (Signed)
Pt di not want CPAP, does not like mask , she may try later Daughter will assist.

## 2013-01-18 LAB — BASIC METABOLIC PANEL
CO2: 27 mEq/L (ref 19–32)
Calcium: 9 mg/dL (ref 8.4–10.5)
GFR calc Af Amer: >90 mL/min (ref >90)
Sodium: 124 mEq/L — ABNORMAL LOW (ref 135–145)

## 2013-01-18 LAB — GLUCOSE, CAPILLARY
Glucose-Capillary: 87 mg/dL (ref 70–99)
Glucose-Capillary: 96 mg/dL (ref 70–99)
Glucose-Capillary: 113 mg/dL — ABNORMAL HIGH (ref 70–99)

## 2013-01-18 LAB — PROTIME-INR: INR: 2.65 — ABNORMAL HIGH (ref 0.00–1.49)

## 2013-01-18 MED ORDER — OXYCODONE HCL 5 MG PO TABS
5.0000 mg | ORAL_TABLET | Freq: Every day | ORAL | Status: DC | PRN
  Administered 2013-01-18: 5 mg via ORAL
  Filled 2013-01-18: qty 1

## 2013-01-18 MED ORDER — DICLOFENAC SODIUM 1 % TD GEL
2.0000 g | Freq: Four times a day (QID) | TRANSDERMAL | Status: DC | PRN
  Administered 2013-01-18 – 2013-01-20 (×5): 2 g via TOPICAL
  Filled 2013-01-18: qty 100

## 2013-01-18 MED ORDER — WARFARIN SODIUM 2.5 MG PO TABS
2.5000 mg | ORAL_TABLET | Freq: Once | ORAL | Status: AC
  Administered 2013-01-18: 2.5 mg via ORAL
  Filled 2013-01-18: qty 1

## 2013-01-18 NOTE — Progress Notes (Signed)
Subjective: Interval History: has complaints weakness. Patient denies any nausea or vomiting..  Objective: Vital signs in last 24 hours: Temp:  [97.5 F (36.4 C)-98.1 F (36.7 C)] 97.6 F (36.4 C) (04/19 0528) Pulse Rate:  [47-64] 47 (04/18 2054) Resp:  [18] 18 (04/19 0528) BP: (132-144)/(72-80) 144/75 mmHg (04/19 0528) SpO2:  [94 %-96 %] 96 % (04/19 0528) Weight:  [99.1 kg (218 lb 7.6 oz)] 99.1 kg (218 lb 7.6 oz) (04/19 0528) Weight change: 4.4 kg (9 lb 11.2 oz)  Intake/Output from previous day: 04/18 0701 - 04/19 0700 In: 480 [P.O.:480] Out: 1200 [Urine:1200] Intake/Output this shift:    General appearance: alert, cooperative and no distress Resp: clear to auscultation bilaterally Cardio: regular rate and rhythm, S1, S2 normal, no murmur, click, rub or gallop GI: soft, non-tender; bowel sounds normal; no masses,  no organomegaly Extremities: extremities normal, atraumatic, no cyanosis or edema  Lab Results: No results found for this basename: WBC, HGB, HCT, PLT,  in the last 72 hours BMET:  Recent Labs  01/17/13 0532 01/18/13 0650  NA 122* 124*  K 3.7 3.8  CL 86* 87*  CO2 26 27  GLUCOSE 86 85  BUN 4* 5*  CREATININE 0.48* 0.53  CALCIUM 8.6 9.0   No results found for this basename: PTH,  in the last 72 hours Iron Studies: No results found for this basename: IRON, TIBC, TRANSFERRIN, FERRITIN,  in the last 72 hours  Studies/Results: Dg Abd 2 Views  20-Jan-2013  *RADIOLOGY REPORT*  Clinical Data: Nausea.  Vomiting.  Evaluate bowel gas pattern.  ABDOMEN - 2 VIEW  Comparison: CT of 01/14/2013  Findings: Upright supine views.  Both are degraded by patient body habitus.  The upright view demonstrates no free intraperitoneal air.  Numerous leads project over the abdomen.  No definite air- fluid levels; the pelvis is excluded from the upright view.  The supine view demonstrates gas and contrast throughout the colon. There is central lower abdominal lucency which could be  within the sigmoid colon.  Small bowel dilatation cannot be entirely excluded. No pneumatosis.  Distal gas identified.  IMPRESSION: Nonspecific bowel gas pattern.  Gas is felt to primarily be within the colon.  Cannot exclude small bowel dilatation centrally.  This would suggest a component of adynamic ileus versus low grade partial small bowel obstruction.  Consider short-term radiographic follow-up.   Original Report Authenticated By: Jeronimo Greaves, M.D.     I have reviewed the patient's current medications.  Assessment/Plan: Problem #1 hyponatremia. Her serum osmolality is 255 low hence true hyponatremia. Her urine sodium is low and urine osmolalities high. Presently her uric acid is 5.1 his normal. The etiology for her hyponatremia seems to be hypovolemic hyponatremia and sodium presently is 124 improving. Problem #2 hypertension blood pressure seems reasonably controlled Problem #3 diabetes Problem #4 after fibrillation Problem #5 history of breast A. Problem #6 hypothyroidism. Problem #7 nausea has improved. Plan: Continue his present management We'll check her basic metabolic panel in the morning.    LOS: 4 days   Rossie Scarfone S 01/18/2013,10:14 AM

## 2013-01-18 NOTE — Progress Notes (Signed)
[name] PROGRESS NOTE  JIA DOTTAVIO ZOX:[id number] DOB: [birth year] DOA: 01/14/2013 PCP: [name], MD  Assessment/Plan: 1. Symptomatic hypovolemic hyponatremia: Now asymptomatic. Somewhat better today. Continue salt supplementation, free water restriction. Appreciate nephrology consultation and recommendations. Resume Celexa? Sodium dropped in response to isotonic saline 4/16. CT of the chest abdomen and pelvis unremarkable with no evidence of malignancy.  2. Nausea, vomiting, lower abdominal pain times one week: Resolved. Likely secondary to hyponatremia.  3. Acute encephalopathy, delirium: Presumed secondary to hyponatremia. Resolved. 4. Diabetes mellitus: Well controlled. Sliding-scale insulin. 5. Eosinophilia: Etiology unclear. Long-standing by history. IgE normal. Started Claritin and H2 blocker. Followup as outpatient. 6. Dysuria, Vaginitis, Perineal Maceration/Rash: Long-standing issue per patient and daughter, has been on some kind of vaginal cream for many years. No obvious urine obstruction, good urine output, no hydro on CT or bladder wall thickening, consider outpatient urology evaluation. 7. Atrial fibrillation: Rate controlled. Continue warfarin per pharmacy. 8. Obstructive sleep apnea: Continue CPAP 9. Status post aortic valve replacement: Tissue valve per daughter. 10. History right breast cancer 11. History of aneurysm, headache 12. Umbilical hernia: Asymptomatic. Follow clinically. Surgical consultation appreciated. 13. Hypothyroidism: Stable. TSH normal. 14. Chronic pain from her low back DJD, severe knee OA, and pain from prior injuries related to falls at home. 15. Chronic gait instability: Physical therapy evaluation. 16. History of left hip fracture 11/2012: Being treated conservatively for this.  Code Status: Full code Family Communication: Discussed with daughter at bedside again 4/19 Disposition Plan: Home when improved, hopefully one to 2  days.  Brendia Sacks, MD  [name]  Pager [phone] If 7PM-7AM, please contact night-coverage at [email or web address], password [facility] 01/18/2013, 2:24 PM  LOS: 4 days   Brief narrative: 79 year old woman presented with nausea, vomiting and was admitted for hyponatremia.  Consultants:  Nephrology  Physical therapy: No followup needed.  Procedures:    HPI/Subjective: Continues to improve. Eating well. Family at bedside very pleased with progress.  Objective: Filed Vitals:   01/17/13 0957 01/17/13 1300 01/17/13 2054 01/18/13 0528  BP: 156/74 132/72 135/80 144/75  Pulse:  64 47   Temp:  97.5 F (36.4 C) 98.1 F (36.7 C) 97.6 F (36.4 C)  TempSrc:  Oral Oral Oral  Resp:  18 18 18   Height:      Weight:    99.1 kg (218 lb 7.6 oz)  SpO2:  95% 94% 96%    Intake/Output Summary (Last 24 hours) at 01/18/13 1424 Last data filed at 01/18/13 0630  Gross per 24 hour  Intake    360 ml  Output    750 ml  Net   -390 ml   Filed Weights   01/15/13 0500 01/17/13 0405 01/18/13 0528  Weight: 97.9 kg (215 lb 13.3 oz) 94.7 kg (208 lb 12.4 oz) 99.1 kg (218 lb 7.6 oz)    Exam:  General:  Appears calm and comfortable, sitting up in chair eating lunch.  Cardiovascular: RRR, 3/6 holosystolic murmur, no r/g.  Respiratory: CTA bilaterally, no w/r/r. Normal respiratory effort. Psychiatric: grossly normal. Oriented to location, month, year.   Data Reviewed: Basic Metabolic Panel:  Recent Labs Lab 01/15/13 0543 01/15/13 1359 01/16/13 0530 01/17/13 0532 01/18/13 0650  NA 120* 120* 124* 122* 124*  K 4.3 4.3 3.8 3.7 3.8  CL 86* 86* 88* 86* 87*  CO2 25 26 27 26 27   GLUCOSE 85 88 87 86 85  BUN 4* 4* 3* 4* 5*  CREATININE 0.61 0.64 0.51 0.48* 0.53  CALCIUM 8.7  8.6 8.7 8.6 9.0   Liver Function Tests:  Recent Labs Lab 01/14/13 1549  AST 21  ALT 8  ALKPHOS 72  BILITOT 0.5  PROT 7.2  ALBUMIN 3.7    Recent Labs Lab 01/14/13 1549  LIPASE 18   CBC:  Recent Labs Lab  01/14/13 1549 01/15/13 0543  WBC 8.9 6.7  NEUTROABS 5.1  --   HGB 13.5 12.2  HCT 37.0 34.2*  MCV 78.2 79.9  PLT 267 247   Cardiac Enzymes:  Recent Labs Lab 01/14/13 1604 01/15/13 0543  TROPONINI <0.30 <0.30    Recent Labs  11/11/12 0108 01/14/13 2215  PROBNP 354.6 386.4   CBG:  Recent Labs Lab 01/17/13 1137 01/17/13 1636 01/17/13 2052 01/18/13 0752 01/18/13 1215  GLUCAP 110* 99 106* 87 96    Studies: Dg Abd 2 Views  01/16/2013  *RADIOLOGY REPORT*  Clinical Data: Nausea.  Vomiting.  Evaluate bowel gas pattern.  ABDOMEN - 2 VIEW  Comparison: CT of 01/14/2013  Findings: Upright supine views.  Both are degraded by patient body habitus.  The upright view demonstrates no free intraperitoneal air.  Numerous leads project over the abdomen.  No definite air- fluid levels; the pelvis is excluded from the upright view.  The supine view demonstrates gas and contrast throughout the colon. There is central lower abdominal lucency which could be within the sigmoid colon.  Small bowel dilatation cannot be entirely excluded. No pneumatosis.  Distal gas identified.  IMPRESSION: Nonspecific bowel gas pattern.  Gas is felt to primarily be within the colon.  Cannot exclude small bowel dilatation centrally.  This would suggest a component of adynamic ileus versus low grade partial small bowel obstruction.  Consider short-term radiographic follow-up.   Original Report Authenticated By: Jeronimo Greaves, M.D.     Scheduled Meds: . clotrimazole   Topical BID  . furosemide  40 mg Oral Daily  . hydrocortisone   Topical BID  . insulin aspart  0-15 Units Subcutaneous TID WC  . insulin aspart  0-5 Units Subcutaneous QHS  . loratadine  10 mg Oral Daily  . metoprolol succinate  25 mg Oral Daily  . simvastatin  20 mg Oral QHS  . sodium chloride  3 mL Intravenous Q12H  . sodium chloride  1 g Oral BID WC  . warfarin  2.5 mg Oral Once  . Warfarin - Pharmacist Dosing Inpatient   Does not apply q1800    Continuous Infusions:   Principal Problem:   Hyponatremia Active Problems:   DIABETES MELLITUS, TYPE II   HYPERLIPIDEMIA   Eosinophilia   HYPERTENSION   Atrial fibrillation, chronic   GERD   BREAST CANCER, HX OF   Chronic anticoagulation   Sleep apnea   Aortic valve disease   Depression   Hypothyroidism   Gait instability   Difficulty hearing   Brain aneurysm   Compression fracture   Weakness   Osteoarthritis of both knees   Vomiting and Abdominal Pain   Spastic dysuria   Atrophic vaginitis   Chronic cough   Weight loss   History of Concussion   Brendia Sacks, MD  [name] Pager [phone] If 7PM-7AM, please contact night-coverage at [email or web address], password [facility] 01/18/2013, 2:24 PM  LOS: 4 days   Time spent: 15 minutes

## 2013-01-18 NOTE — Progress Notes (Signed)
ANTICOAGULATION CONSULT NOTE   Pharmacy Consult for Coumadin (chronic PTA) Indication: atrial fibrillation  Allergies  Allergen Reactions  . Nsaids     Bleeding ulcer  . Codeine Nausea And Vomiting  . Adhesive (Tape) Rash    Redness, and peeling skin off.    Patient Measurements: Height: 5' 5.5" (166.4 cm) Weight: 218 lb 7.6 oz (99.1 kg) IBW/kg (Calculated) : 58.15  Vital Signs: Temp: 97.6 F (36.4 C) (04/19 0528) Temp src: Oral (04/19 0528) BP: 144/75 mmHg (04/19 0528) Pulse Rate: 47 (04/18 2054)  Labs:  Recent Labs  01/16/13 0530 01/17/13 0532 01/18/13 0650  LABPROT 30.8* 33.4* 27.0*  INR 3.17* 3.54* 2.65*  CREATININE 0.51 0.48* 0.53    Estimated Creatinine Clearance: 67.2 ml/min (by C-G formula based on Cr of 0.53).  Medical History: Past Medical History  Diagnosis Date  . Diabetes mellitus   . Hyperlipidemia   . Eosinophilia   . Cataract   . Hypertension   . Atrial fibrillation prior to 2008    moderate biatrial enlargement in 2009; mild to moderate LVH with a low-normal EF  . GERD (gastroesophageal reflux disease)     Hemorrhoids; history of peptic ulcer disease  . DJD (degenerative joint disease) of knee   . Sleep apnea     CPAP  . Chronic anticoagulation   . Aortic valve disease     2009: mild stenosis and regurgitation; peak gradient of 30-35 mmHg , subsequent AVR at [facility]  . Incontinence     Mild  . Carcinoma of breast     Right mastectomy; radiation and hormonal therapy  . Hip fracture, left 12/12/2012  . Foot injury 06/25/2012  . Degenerative joint disease of knee 01/29/2008    Bilateral    . BREAST CANCER, HX OF 01/29/2008    Lumpectomy, radiation therapy  Arimidex stopped in July 2013    . Syncope and collapse 11/11/2012   Medications:  Prescriptions prior to admission  Medication Sig Dispense Refill  . acetaminophen (TYLENOL) 650 MG CR tablet Take 1,300 mg by mouth every 8 (eight) hours as needed for pain.      Marland Kitchen albuterol (PROVENTIL  HFA;VENTOLIN HFA) 108 (90 BASE) MCG/ACT inhaler Inhale 2 puffs into the lungs every 4 (four) hours as needed for wheezing.  1 Inhaler  2  . albuterol (PROVENTIL) (2.5 MG/3ML) 0.083% nebulizer solution Take 3 mLs (2.5 mg total) by nebulization every 6 (six) hours as needed for wheezing.  75 mL  3  . amLODipine (NORVASC) 5 MG tablet Take 5 mg by mouth daily.      Marland Kitchen buPROPion (WELLBUTRIN) 75 MG tablet Take 0.5 tablets (37.5 mg total) by mouth 2 (two) times daily.  30 tablet  2  . calcium carbonate (OS-CAL) 600 MG TABS Take 600 mg by mouth daily.      . clotrimazole-betamethasone (LOTRISONE) cream Apply 1 application topically 2 (two) times daily.      . diclofenac sodium (VOLTAREN) 1 % GEL Apply 2 g topically daily as needed (Pain).      . furosemide (LASIX) 40 MG tablet Take 1 tablet (40 mg total) by mouth daily. For fluid  30 tablet  6  . glimepiride (AMARYL) 1 MG tablet Take 1 mg by mouth daily before breakfast.      . HYDROcodone-acetaminophen (NORCO) 5-325 MG per tablet Take 1 tablet by mouth every 6 (six) hours as needed for pain.  60 tablet  2  . levothyroxine (SYNTHROID, LEVOTHROID) 50 MCG tablet Take 50  mcg by mouth daily.      Marland Kitchen lisinopril (PRINIVIL,ZESTRIL) 40 MG tablet Take 40 mg by mouth daily.      . metoprolol succinate (TOPROL-XL) 25 MG 24 hr tablet Take 25 mg by mouth daily.      . Multiple Vitamin (MULTIVITAMIN) tablet Take 1 tablet by mouth daily.      Marland Kitchen omeprazole (PRILOSEC) 20 MG capsule Take 1 capsule (20 mg total) by mouth 2 (two) times daily. For stomach acid  60 capsule  6  . polyethylene glycol (MIRALAX / GLYCOLAX) packet Take 17 g by mouth 2 (two) times daily.      . potassium chloride SA (K-DUR,KLOR-CON) 20 MEQ tablet Take 1 tablet (20 mEq total) by mouth daily. For potassium and fluid pill  30 tablet  6  . promethazine (PHENERGAN) 12.5 MG tablet Take 12.5 mg by mouth every 6 (six) hours as needed for nausea.      . simvastatin (ZOCOR) 20 MG tablet Take 1 tablet (20 mg  total) by mouth at bedtime.  30 tablet  6  . warfarin (COUMADIN) 5 MG tablet Take 5-10 mg by mouth daily. Takes 5 mg every day except on Tues when she takes 10 mg.       Assessment: 79yo female on chronic Coumadin PTA.  Pt is maintained on Coumadin 5mg  daily.  INR has decreased to therapeutic range after holding Coumadin for 2 days.    Goal of Therapy:  INR 2-3 Monitor platelets by anticoagulation protocol: Yes   Plan: Coumadin 2.5 today, due to INR rapid rise after 5 mg continued from home. INR daily  CBC in AM  [place], Lorien Shingler Bennett 01/18/2013,8:30 AM

## 2013-01-19 LAB — CBC
HCT: 32.7 % — ABNORMAL LOW (ref 36.0–46.0)
Hemoglobin: 11.8 g/dL — ABNORMAL LOW (ref 12.0–15.0)
MCH: 28.9 pg (ref 26.0–34.0)
MCHC: 36.1 g/dL — ABNORMAL HIGH (ref 30.0–36.0)
RBC: 4.08 MIL/uL (ref 3.87–5.11)

## 2013-01-19 LAB — BASIC METABOLIC PANEL
BUN: 5 mg/dL — ABNORMAL LOW (ref 6–23)
Chloride: 88 mEq/L — ABNORMAL LOW (ref 96–112)
GFR calc Af Amer: >90 mL/min (ref >90)
GFR calc non Af Amer: 89 mL/min — ABNORMAL LOW (ref >90)
Glucose, Bld: 81 mg/dL (ref 70–99)
Potassium: 3.9 mEq/L (ref 3.5–5.1)
Sodium: 123 mEq/L — ABNORMAL LOW (ref 135–145)

## 2013-01-19 LAB — GLUCOSE, CAPILLARY
Glucose-Capillary: 87 mg/dL (ref 70–99)
Glucose-Capillary: 108 mg/dL — ABNORMAL HIGH (ref 70–99)

## 2013-01-19 MED ORDER — WARFARIN SODIUM 5 MG PO TABS
5.0000 mg | ORAL_TABLET | Freq: Once | ORAL | Status: AC
  Administered 2013-01-19: 5 mg via ORAL
  Filled 2013-01-19: qty 1

## 2013-01-19 MED ORDER — DEMECLOCYCLINE HCL 150 MG PO TABS
300.0000 mg | ORAL_TABLET | Freq: Three times a day (TID) | ORAL | Status: DC
  Administered 2013-01-19 – 2013-01-21 (×7): 300 mg via ORAL
  Filled 2013-01-19 (×11): qty 2

## 2013-01-19 MED ORDER — OXYCODONE HCL 5 MG PO TABS
5.0000 mg | ORAL_TABLET | Freq: Four times a day (QID) | ORAL | Status: DC | PRN
  Administered 2013-01-19 – 2013-01-21 (×5): 5 mg via ORAL
  Filled 2013-01-19 (×5): qty 1

## 2013-01-19 NOTE — Progress Notes (Signed)
ANTICOAGULATION CONSULT NOTE   Pharmacy Consult for Coumadin (chronic PTA) Indication: atrial fibrillation  Allergies  Allergen Reactions  . Nsaids     Bleeding ulcer  . Codeine Nausea And Vomiting  . Adhesive (Tape) Rash    Redness, and peeling skin off.    Patient Measurements: Height: 5' 5.5" (166.4 cm) Weight: 216 lb 4.3 oz (98.1 kg) IBW/kg (Calculated) : 58.15  Vital Signs: Temp: 97.6 F (36.4 C) (04/20 0619) Temp src: Oral (04/20 0619) BP: 145/76 mmHg (04/20 0619) Pulse Rate: 52 (04/20 0619)  Labs:  Recent Labs  01/17/13 0532 01/18/13 0650 01/19/13 0655  HGB  --   --  11.8*  HCT  --   --  32.7*  PLT  --   --  234  LABPROT 33.4* 27.0* 21.3*  INR 3.54* 2.65* 1.93*  CREATININE 0.48* 0.53 0.52    Estimated Creatinine Clearance: 66.8 ml/min (by C-G formula based on Cr of 0.52).  Medical History: Past Medical History  Diagnosis Date  . Diabetes mellitus   . Hyperlipidemia   . Eosinophilia   . Cataract   . Hypertension   . Atrial fibrillation prior to 2008    moderate biatrial enlargement in 2009; mild to moderate LVH with a low-normal EF  . GERD (gastroesophageal reflux disease)     Hemorrhoids; history of peptic ulcer disease  . DJD (degenerative joint disease) of knee   . Sleep apnea     CPAP  . Chronic anticoagulation   . Aortic valve disease     2009: mild stenosis and regurgitation; peak gradient of 30-35 mmHg , subsequent AVR at [facility]  . Incontinence     Mild  . Carcinoma of breast     Right mastectomy; radiation and hormonal therapy  . Hip fracture, left 12/12/2012  . Foot injury 06/25/2012  . Degenerative joint disease of knee 01/29/2008    Bilateral    . BREAST CANCER, HX OF 01/29/2008    Lumpectomy, radiation therapy  Arimidex stopped in July 2013    . Syncope and collapse 11/11/2012   Medications:  Prescriptions prior to admission  Medication Sig Dispense Refill  . acetaminophen (TYLENOL) 650 MG CR tablet Take 1,300 mg by mouth every 8  (eight) hours as needed for pain.      Marland Kitchen albuterol (PROVENTIL HFA;VENTOLIN HFA) 108 (90 BASE) MCG/ACT inhaler Inhale 2 puffs into the lungs every 4 (four) hours as needed for wheezing.  1 Inhaler  2  . albuterol (PROVENTIL) (2.5 MG/3ML) 0.083% nebulizer solution Take 3 mLs (2.5 mg total) by nebulization every 6 (six) hours as needed for wheezing.  75 mL  3  . amLODipine (NORVASC) 5 MG tablet Take 5 mg by mouth daily.      Marland Kitchen buPROPion (WELLBUTRIN) 75 MG tablet Take 0.5 tablets (37.5 mg total) by mouth 2 (two) times daily.  30 tablet  2  . calcium carbonate (OS-CAL) 600 MG TABS Take 600 mg by mouth daily.      . clotrimazole-betamethasone (LOTRISONE) cream Apply 1 application topically 2 (two) times daily.      . diclofenac sodium (VOLTAREN) 1 % GEL Apply 2 g topically daily as needed (Pain).      . furosemide (LASIX) 40 MG tablet Take 1 tablet (40 mg total) by mouth daily. For fluid  30 tablet  6  . glimepiride (AMARYL) 1 MG tablet Take 1 mg by mouth daily before breakfast.      . HYDROcodone-acetaminophen (NORCO) 5-325 MG per tablet Take  1 tablet by mouth every 6 (six) hours as needed for pain.  60 tablet  2  . levothyroxine (SYNTHROID, LEVOTHROID) 50 MCG tablet Take 50 mcg by mouth daily.      Marland Kitchen lisinopril (PRINIVIL,ZESTRIL) 40 MG tablet Take 40 mg by mouth daily.      . metoprolol succinate (TOPROL-XL) 25 MG 24 hr tablet Take 25 mg by mouth daily.      . Multiple Vitamin (MULTIVITAMIN) tablet Take 1 tablet by mouth daily.      Marland Kitchen omeprazole (PRILOSEC) 20 MG capsule Take 1 capsule (20 mg total) by mouth 2 (two) times daily. For stomach acid  60 capsule  6  . polyethylene glycol (MIRALAX / GLYCOLAX) packet Take 17 g by mouth 2 (two) times daily.      . potassium chloride SA (K-DUR,KLOR-CON) 20 MEQ tablet Take 1 tablet (20 mEq total) by mouth daily. For potassium and fluid pill  30 tablet  6  . promethazine (PHENERGAN) 12.5 MG tablet Take 12.5 mg by mouth every 6 (six) hours as needed for nausea.       . simvastatin (ZOCOR) 20 MG tablet Take 1 tablet (20 mg total) by mouth at bedtime.  30 tablet  6  . warfarin (COUMADIN) 5 MG tablet Take 5-10 mg by mouth daily. Takes 5 mg every day except on Tues when she takes 10 mg.       Assessment: 79yo female on chronic Coumadin PTA.  Pt is maintained on Coumadin 5mg  daily.  INR has decreased to therapeutic range after holding Coumadin for 2 days. INR sub therapeutic today after 2.5 mg dose yesterday (1.93)  Goal of Therapy:  INR 2-3 Monitor platelets by anticoagulation protocol: Yes   Plan: Coumadin 5 today (home regiment) INR daily  CBC in AM  Cloyce Blankenhorn Bennett 01/19/2013,8:47 AM

## 2013-01-19 NOTE — Progress Notes (Signed)
[name] PROGRESS NOTE  NASHAYLA TELLERIA ZOX:[id number] DOB: June 24, 1932 DOA: 01/14/2013 PCP: [name], MD  Assessment/Plan: 1. Symptomatic hypovolemic hyponatremia: Remains asymptomatic. Sodium slightly enlarged. Continue salt supplementation, free water restriction. Appreciate nephrology consultation and recommendations. Demeclocycline ordered. Sodium dropped in response to isotonic saline 4/16. CT of the chest abdomen and pelvis unremarkable with no evidence of malignancy.  2. Nausea, vomiting, lower abdominal pain times one week: Resolved. Likely secondary to hyponatremia.  3. Acute encephalopathy, delirium: Presumed secondary to hyponatremia. Resolved. 4. Diabetes mellitus: Well controlled. Sliding-scale insulin. 5. Eosinophilia: Etiology unclear. Long-standing by history. IgE normal. Started Claritin and H2 blocker. Followup as outpatient. 6. Dysuria, Vaginitis, Perineal Maceration/Rash: Long-standing issue per patient and daughter, has been on some kind of vaginal cream for many years. No obvious urine obstruction, good urine output, no hydro on CT or bladder wall thickening, consider outpatient urology evaluation. 7. Atrial fibrillation: Rate controlled. Continue warfarin per pharmacy. 8. Obstructive sleep apnea: Continue CPAP 9. Status post aortic valve replacement: Tissue valve per daughter. 10. Umbilical hernia: Asymptomatic. Follow clinically. Surgical consultation appreciated. 11. Chronic pain from her low back DJD, severe knee OA, and pain from prior injuries related to falls at home. 12. History of left hip fracture 11/2012: Being treated conservatively for this.  Code Status: Full code Family Communication: Discussed with family at bedside. Disposition Plan: Home when improved, hopefully one to 2 days.  Brendia Sacks, MD  [name]  Pager [phone] If 7PM-7AM, please contact night-coverage at [email or web address], password [facility] 01/19/2013, 11:21 AM  LOS: 5  days   Brief narrative: 79 year old woman presented with nausea, vomiting and was admitted for hyponatremia.  Consultants:  Nephrology  Physical therapy: No followup needed.  Procedures:    HPI/Subjective: No new issues. Doing well. Eating well. Family at bedside.  Objective: Filed Vitals:   01/18/13 1445 01/18/13 2024 01/19/13 0619 01/19/13 1031  BP: 133/74 136/65 145/76 150/75  Pulse: 62 50 52 54  Temp: 98.3 F (36.8 C) 97.8 F (36.6 C) 97.6 F (36.4 C)   TempSrc: Oral Oral Oral   Resp: 18 18 18    Height:      Weight:   98.1 kg (216 lb 4.3 oz)   SpO2: 95% 97% 96%     Intake/Output Summary (Last 24 hours) at 01/19/13 1121 Last data filed at 01/19/13 0622  Gross per 24 hour  Intake    720 ml  Output   1200 ml  Net   -480 ml   Filed Weights   01/17/13 0405 01/18/13 0528 01/19/13 0619  Weight: 94.7 kg (208 lb 12.4 oz) 99.1 kg (218 lb 7.6 oz) 98.1 kg (216 lb 4.3 oz)    Exam:  General:  Appears calm and comfortable. Very alert. Speech fluent and clear. Cardiovascular: RRR, 3/6 holosystolic murmur, no r/g.  Respiratory: CTA bilaterally, no w/r/r. Normal respiratory effort. Psychiatric: grossly normal.    Data Reviewed: Basic Metabolic Panel:  Recent Labs Lab 01/15/13 1359 01/16/13 0530 01/17/13 0532 01/18/13 0650 01/19/13 0655  NA 120* 124* 122* 124* 123*  K 4.3 3.8 3.7 3.8 3.9  CL 86* 88* 86* 87* 88*  CO2 26 27 26 27 27   GLUCOSE 88 87 86 85 81  BUN 4* 3* 4* 5* 5*  CREATININE 0.64 0.51 0.48* 0.53 0.52  CALCIUM 8.6 8.7 8.6 9.0 8.9   Liver Function Tests:  Recent Labs Lab 01/14/13 1549  AST 21  ALT 8  ALKPHOS 72  BILITOT 0.5  PROT 7.2  ALBUMIN  3.7    Recent Labs Lab 01/14/13 1549  LIPASE 18   CBC:  Recent Labs Lab 01/14/13 1549 01/15/13 0543 01/19/13 0655  WBC 8.9 6.7 4.5  NEUTROABS 5.1  --   --   HGB 13.5 12.2 11.8*  HCT 37.0 34.2* 32.7*  MCV 78.2 79.9 80.1  PLT 267 247 234   Cardiac Enzymes:  Recent Labs Lab  01/14/13 1604 01/15/13 0543  TROPONINI <0.30 <0.30    Recent Labs  11/11/12 0108 01/14/13 2215  PROBNP 354.6 386.4   CBG:  Recent Labs Lab 01/18/13 0752 01/18/13 1215 01/18/13 1658 01/18/13 2028 01/19/13 0805  GLUCAP 87 96 113* 121* 87    Studies: No results found.  Scheduled Meds: . clotrimazole   Topical BID  . demeclocycline  300 mg Oral Q8H  . furosemide  40 mg Oral Daily  . hydrocortisone   Topical BID  . insulin aspart  0-15 Units Subcutaneous TID WC  . insulin aspart  0-5 Units Subcutaneous QHS  . loratadine  10 mg Oral Daily  . metoprolol succinate  25 mg Oral Daily  . simvastatin  20 mg Oral QHS  . sodium chloride  3 mL Intravenous Q12H  . sodium chloride  1 g Oral BID WC  . warfarin  5 mg Oral Once  . Warfarin - Pharmacist Dosing Inpatient   Does not apply q1800   Continuous Infusions:   Principal Problem:   Hyponatremia Active Problems:   DIABETES MELLITUS, TYPE II   HYPERLIPIDEMIA   Eosinophilia   HYPERTENSION   Atrial fibrillation, chronic   GERD   BREAST CANCER, HX OF   Chronic anticoagulation   Sleep apnea   Aortic valve disease   Depression   Hypothyroidism   Gait instability   Difficulty hearing   Brain aneurysm   Compression fracture   Weakness   Osteoarthritis of both knees   Vomiting and Abdominal Pain   Spastic dysuria   Atrophic vaginitis   Chronic cough   Weight loss   History of Concussion   Brendia Sacks, MD  [name] Pager [phone] If 7PM-7AM, please contact night-coverage at [email or web address], password [facility] 01/19/2013, 11:21 AM  LOS: 5 days   Time spent: 15 minutes

## 2013-01-19 NOTE — Progress Notes (Signed)
Subjective: Interval History: has no complaint of nausea or vomiting. Still she has some weakness. Patient denies any difficulty in breathing..  Objective: Vital signs in last 24 hours: Temp:  [97.6 F (36.4 C)-98.3 F (36.8 C)] 97.6 F (36.4 C) (04/20 0619) Pulse Rate:  [50-62] 52 (04/20 0619) Resp:  [18] 18 (04/20 0619) BP: (133-145)/(65-76) 145/76 mmHg (04/20 0619) SpO2:  [95 %-97 %] 96 % (04/20 0619) Weight:  [98.1 kg (216 lb 4.3 oz)] 98.1 kg (216 lb 4.3 oz) (04/20 0619) Weight change: -1 kg (-2 lb 3.3 oz)  Intake/Output from previous day: 04/19 0701 - 04/20 0700 In: 840 [P.O.:840] Out: 1200 [Urine:1200] Intake/Output this shift:   Generally  patient alert no apparent distress Chest is clear to auscultation no rales no rhonchi or egophony Heart exam revealed regular rate and rhythm no murmur no history Abdomen soft positive bowel sound Extremities no edema.  Lab Results:  Recent Labs  01/19/13 0655  WBC 4.5  HGB 11.8*  HCT 32.7*  PLT 234   BMET:  Recent Labs  01/18/13 0650 01/19/13 0655  NA 124* 123*  K 3.8 3.9  CL 87* 88*  CO2 27 27  GLUCOSE 85 81  BUN 5* 5*  CREATININE 0.53 0.52  CALCIUM 9.0 8.9   No results found for this basename: PTH,  in the last 72 hours Iron Studies: No results found for this basename: IRON, TIBC, TRANSFERRIN, FERRITIN,  in the last 72 hours  Studies/Results: No results found.  I have reviewed the patient's current medications.  Assessment/Plan: Problem #1 hyponatremia thought to be secondary to hypovolemic hyponatremia and patient was put on freewater restriction and sodium supplement sodium still seems to be low. At this moment patient might have possibly superimposed SIADH  from medications . Problem #2 hypertension her blood pressure seems to be reasonably controlled Problem #3 diabetes Problem #4 sleep apnea Problem #5 depression Problem #6 history of her breast cancer Problem #7 degenerative joint disease with back  pain. Plan: Will start patient on demeclocycline Sandra milligram by mouth 3 times a day We'll continue with freewater restriction Will check her basic metabolic panel in the morning.   LOS: 5 days   Broedy Osbourne S 01/19/2013,8:48 AM

## 2013-01-20 DIAGNOSIS — R1110 Vomiting, unspecified: Secondary | ICD-10-CM

## 2013-01-20 LAB — BASIC METABOLIC PANEL
CO2: 28 mEq/L (ref 19–32)
Calcium: 9.2 mg/dL (ref 8.4–10.5)
Creatinine, Ser: 0.62 mg/dL (ref 0.50–1.10)
GFR calc non Af Amer: 84 mL/min — ABNORMAL LOW (ref >90)
Glucose, Bld: 90 mg/dL (ref 70–99)

## 2013-01-20 LAB — PATHOLOGIST SMEAR REVIEW

## 2013-01-20 LAB — GLUCOSE, CAPILLARY
Glucose-Capillary: 98 mg/dL (ref 70–99)
Glucose-Capillary: 117 mg/dL — ABNORMAL HIGH (ref 70–99)

## 2013-01-20 MED ORDER — WARFARIN SODIUM 10 MG PO TABS
10.0000 mg | ORAL_TABLET | Freq: Once | ORAL | Status: AC
  Administered 2013-01-20: 10 mg via ORAL
  Filled 2013-01-20: qty 1

## 2013-01-20 NOTE — Progress Notes (Signed)
ANTICOAGULATION CONSULT NOTE   Pharmacy Consult for Coumadin (chronic PTA) Indication: atrial fibrillation  Allergies  Allergen Reactions  . Nsaids     Bleeding ulcer  . Codeine Nausea And Vomiting  . Adhesive (Tape) Rash    Redness, and peeling skin off.    Patient Measurements: Height: 5' 5.5" (166.4 cm) Weight: 218 lb 4.1 oz (99 kg) IBW/kg (Calculated) : 58.15  Vital Signs: Temp: 97.3 F (36.3 C) (04/21 0517) Temp src: Oral (04/21 0517) BP: 139/71 mmHg (04/21 0517) Pulse Rate: 51 (04/21 0517)  Labs:  Recent Labs  01/18/13 0650 01/19/13 0655 01/20/13 0530  HGB  --  11.8*  --   HCT  --  32.7*  --   PLT  --  234  --   LABPROT 27.0* 21.3* 19.8*  INR 2.65* 1.93* 1.75*  CREATININE 0.53 0.52 0.62   Estimated Creatinine Clearance: 67.1 ml/min (by C-G formula based on Cr of 0.62).  Medical History: Past Medical History  Diagnosis Date  . Diabetes mellitus   . Hyperlipidemia   . Eosinophilia   . Cataract   . Hypertension   . Atrial fibrillation prior to 2008    moderate biatrial enlargement in 2009; mild to moderate LVH with a low-normal EF  . GERD (gastroesophageal reflux disease)     Hemorrhoids; history of peptic ulcer disease  . DJD (degenerative joint disease) of knee   . Sleep apnea     CPAP  . Chronic anticoagulation   . Aortic valve disease     2009: mild stenosis and regurgitation; peak gradient of 30-35 mmHg , subsequent AVR at [facility]  . Incontinence     Mild  . Carcinoma of breast     Right mastectomy; radiation and hormonal therapy  . Hip fracture, left 12/12/2012  . Foot injury 06/25/2012  . Degenerative joint disease of knee 01/29/2008    Bilateral    . BREAST CANCER, HX OF 01/29/2008    Lumpectomy, radiation therapy  Arimidex stopped in July 2013    . Syncope and collapse 11/11/2012   Medications:  Prescriptions prior to admission  Medication Sig Dispense Refill  . acetaminophen (TYLENOL) 650 MG CR tablet Take 1,300 mg by mouth every 8  (eight) hours as needed for pain.      Marland Kitchen albuterol (PROVENTIL HFA;VENTOLIN HFA) 108 (90 BASE) MCG/ACT inhaler Inhale 2 puffs into the lungs every 4 (four) hours as needed for wheezing.  1 Inhaler  2  . albuterol (PROVENTIL) (2.5 MG/3ML) 0.083% nebulizer solution Take 3 mLs (2.5 mg total) by nebulization every 6 (six) hours as needed for wheezing.  75 mL  3  . amLODipine (NORVASC) 5 MG tablet Take 5 mg by mouth daily.      Marland Kitchen buPROPion (WELLBUTRIN) 75 MG tablet Take 0.5 tablets (37.5 mg total) by mouth 2 (two) times daily.  30 tablet  2  . calcium carbonate (OS-CAL) 600 MG TABS Take 600 mg by mouth daily.      . clotrimazole-betamethasone (LOTRISONE) cream Apply 1 application topically 2 (two) times daily.      . diclofenac sodium (VOLTAREN) 1 % GEL Apply 2 g topically daily as needed (Pain).      . furosemide (LASIX) 40 MG tablet Take 1 tablet (40 mg total) by mouth daily. For fluid  30 tablet  6  . glimepiride (AMARYL) 1 MG tablet Take 1 mg by mouth daily before breakfast.      . HYDROcodone-acetaminophen (NORCO) 5-325 MG per tablet Take 1  tablet by mouth every 6 (six) hours as needed for pain.  60 tablet  2  . levothyroxine (SYNTHROID, LEVOTHROID) 50 MCG tablet Take 50 mcg by mouth daily.      Marland Kitchen lisinopril (PRINIVIL,ZESTRIL) 40 MG tablet Take 40 mg by mouth daily.      . metoprolol succinate (TOPROL-XL) 25 MG 24 hr tablet Take 25 mg by mouth daily.      . Multiple Vitamin (MULTIVITAMIN) tablet Take 1 tablet by mouth daily.      Marland Kitchen omeprazole (PRILOSEC) 20 MG capsule Take 1 capsule (20 mg total) by mouth 2 (two) times daily. For stomach acid  60 capsule  6  . polyethylene glycol (MIRALAX / GLYCOLAX) packet Take 17 g by mouth 2 (two) times daily.      . potassium chloride SA (K-DUR,KLOR-CON) 20 MEQ tablet Take 1 tablet (20 mEq total) by mouth daily. For potassium and fluid pill  30 tablet  6  . promethazine (PHENERGAN) 12.5 MG tablet Take 12.5 mg by mouth every 6 (six) hours as needed for nausea.       . simvastatin (ZOCOR) 20 MG tablet Take 1 tablet (20 mg total) by mouth at bedtime.  30 tablet  6  . warfarin (COUMADIN) 5 MG tablet Take 5-10 mg by mouth daily. Takes 5 mg every day except on Tues when she takes 10 mg.       Assessment: 79yo female on chronic Coumadin PTA.  Pt is maintained on Coumadin 5mg  daily except 10mg  every Tuesday (as above).  INR has decreased to sub-therapeutic range.    Goal of Therapy:  INR 2-3 Monitor platelets by anticoagulation protocol: Yes   Plan: Coumadin 10mg   today to boost INR INR daily  CBC in AM  Jacelyn Cuen A 01/20/2013,10:05 AM

## 2013-01-20 NOTE — Progress Notes (Signed)
Subjective: Interval History: has no complaint of difficulty in breathing Patient is still that she's feeling and she doesn't have any nausea or vomiting. Still she has some back pain but better..  Objective: Vital signs in last 24 hours: Temp:  [94.5 F (34.7 C)-97.9 F (36.6 C)] 97.3 F (36.3 C) (04/21 0517) Pulse Rate:  [51-54] 51 (04/21 0517) Resp:  [18] 18 (04/21 0517) BP: (133-150)/(65-75) 139/71 mmHg (04/21 0517) SpO2:  [93 %-95 %] 93 % (04/21 0517) Weight:  [99 kg (218 lb 4.1 oz)] 99 kg (218 lb 4.1 oz) (04/21 0517) Weight change: 0.9 kg (1 lb 15.8 oz)  Intake/Output from previous day: 04/20 0701 - 04/21 0700 In: 952 [P.O.:952] Out: 375 [Urine:375] Intake/Output this shift:    General appearance: alert, cooperative and no distress Resp: clear to auscultation bilaterally Cardio: regular rate and rhythm, S1, S2 normal, no murmur, click, rub or gallop GI: soft, non-tender; bowel sounds normal; no masses,  no organomegaly Extremities: extremities normal, atraumatic, no cyanosis or edema  Lab Results:  Recent Labs  01/19/13 0655  WBC 4.5  HGB 11.8*  HCT 32.7*  PLT 234   BMET:  Recent Labs  01/19/13 0655 01/20/13 0530  NA 123* 127*  K 3.9 4.4  CL 88* 91*  CO2 27 28  GLUCOSE 81 90  BUN 5* 9  CREATININE 0.52 0.62  CALCIUM 8.9 9.2   No results found for this basename: PTH,  in the last 72 hours Iron Studies: No results found for this basename: IRON, TIBC, TRANSFERRIN, FERRITIN,  in the last 72 hours  Studies/Results: No results found.  I have reviewed the patient's current medications.  Assessment/Plan: Problem #1 hyponatremia presently patient is on demeclocycline sodium 127 improving. Presently patient is a symptomatic. Problem #2 hypertension blood pressure seems to be reasonably controlled Problem #3 diabetes Problem #4 after fibrillation her heart rate is controlled Problem #5 history of sleep apnea Problem #6 breast cancer. Plan: DC sodium  chloride tablet Continuously with her free water restriction and demeclocycline.    LOS: 6 days   Ruthellen Tippy S 01/20/2013,8:48 AM

## 2013-01-20 NOTE — Progress Notes (Signed)
Pt asleep not wearing CPAP at THIS time

## 2013-01-20 NOTE — Progress Notes (Signed)
[name] PROGRESS NOTE  Kristin Logan UJW:[id number] DOB: 20-Oct-1931 DOA: 01/14/2013 PCP: [name], MD  Assessment/Plan: 1. Symptomatic hypovolemic hyponatremia: Remains asymptomatic. Sodium somewhat improved. Continue free water restriction and demeclocycline. Appreciate nephrology consultation and recommendations. Sodium dropped in response to isotonic saline 4/16. CT of the chest abdomen and pelvis unremarkable with no evidence of malignancy.  2. Nausea, vomiting, lower abdominal pain times one week: Monitor. 3. Acute encephalopathy, delirium: Presumed secondary to hyponatremia. Resolved. 4. Diabetes mellitus: Well controlled. Sliding-scale insulin. 5. Eosinophilia: Etiology unclear. Long-standing by history. IgE normal. Started Claritin and H2 blocker. Followup as outpatient. 6. Dysuria, Vaginitis, Perineal Maceration/Rash: Long-standing issue per patient and daughter, has been on some kind of vaginal cream for many years. No obvious urine obstruction, good urine output, no hydro on CT or bladder wall thickening, consider outpatient urology evaluation. 7. Atrial fibrillation: Rate controlled. Continue warfarin per pharmacy. 8. Obstructive sleep apnea: Continue CPAP 9. Status post aortic valve replacement: Tissue valve per daughter. 10. Umbilical hernia: Asymptomatic. Follow clinically. Surgical consultation appreciated. 11. Chronic pain from her low back DJD, severe knee OA, and pain from prior injuries related to falls at home. 12. History of left hip fracture 11/2012: Being treated conservatively for this.  Monitor for recurrent vomiting. Check sodium in the morning. Anticipate discharge 4/22 if stable.  Code Status: Full code Family Communication: Discussed with family at bedside including son and daughter 4/21. Disposition Plan: Home when improved  Brendia Sacks, MD  [name]  Pager [phone] If 7PM-7AM, please contact night-coverage at [email or web address],  password [facility] 01/20/2013, 11:48 AM  LOS: 6 days   Brief narrative: 79 year old woman presented with nausea, vomiting and was admitted for hyponatremia.  Consultants:  Nephrology  Physical therapy: No followup needed.  Procedures:    HPI/Subjective: Did well yesterday. Had vomiting this morning after breakfast, secondary to pancakes, she thinks. No abdominal pain now.  Objective: Filed Vitals:   01/19/13 1031 01/19/13 1420 01/19/13 2058 01/20/13 0517  BP: 150/75 133/65 135/69 139/71  Pulse: 54 51  51  Temp:  94.5 F (34.7 C) 97.9 F (36.6 C) 97.3 F (36.3 C)  TempSrc:  Oral Oral Oral  Resp:  18 18 18   Height:      Weight:    99 kg (218 lb 4.1 oz)  SpO2:  95% 95% 93%    Intake/Output Summary (Last 24 hours) at 01/20/13 1148 Last data filed at 01/20/13 0517  Gross per 24 hour  Intake    716 ml  Output    375 ml  Net    341 ml   Filed Weights   01/18/13 0528 01/19/13 0619 01/20/13 0517  Weight: 99.1 kg (218 lb 7.6 oz) 98.1 kg (216 lb 4.3 oz) 99 kg (218 lb 4.1 oz)    Exam:  General:  Appears calm and comfortable. Very alert. Speech fluent and clear. Cardiovascular: RRR, 3/6 holosystolic murmur, no r/g.  Respiratory: CTA bilaterally, no w/r/r. Normal respiratory effort. Abdomen: Soft, nontender, nondistended. Positive bowel sounds. Psychiatric: grossly normal.    Data Reviewed: Basic Metabolic Panel:  Recent Labs Lab 01/16/13 0530 01/17/13 0532 01/18/13 0650 01/19/13 0655 01/20/13 0530  NA 124* 122* 124* 123* 127*  K 3.8 3.7 3.8 3.9 4.4  CL 88* 86* 87* 88* 91*  CO2 27 26 27 27 28   GLUCOSE 87 86 85 81 90  BUN 3* 4* 5* 5* 9  CREATININE 0.51 0.48* 0.53 0.52 0.62  CALCIUM 8.7 8.6 9.0 8.9 9.2   Liver  Function Tests:  Recent Labs Lab 01/14/13 1549  AST 21  ALT 8  ALKPHOS 72  BILITOT 0.5  PROT 7.2  ALBUMIN 3.7    Recent Labs Lab 01/14/13 1549  LIPASE 18   CBC:  Recent Labs Lab 01/14/13 1549 01/15/13 0543 01/19/13 0655  WBC 8.9 6.7  4.5  NEUTROABS 5.1  --   --   HGB 13.5 12.2 11.8*  HCT 37.0 34.2* 32.7*  MCV 78.2 79.9 80.1  PLT 267 247 234   Cardiac Enzymes:  Recent Labs Lab 01/14/13 1604 01/15/13 0543  TROPONINI <0.30 <0.30    Recent Labs  11/11/12 0108 01/14/13 2215  PROBNP 354.6 386.4   CBG:  Recent Labs Lab 01/19/13 0805 01/19/13 1218 01/19/13 1715 01/19/13 2056 01/20/13 0746  GLUCAP 87 108* 99 118* 85    Studies: No results found.  Scheduled Meds: . clotrimazole   Topical BID  . demeclocycline  300 mg Oral Q8H  . furosemide  40 mg Oral Daily  . hydrocortisone   Topical BID  . insulin aspart  0-15 Units Subcutaneous TID WC  . insulin aspart  0-5 Units Subcutaneous QHS  . loratadine  10 mg Oral Daily  . metoprolol succinate  25 mg Oral Daily  . simvastatin  20 mg Oral QHS  . sodium chloride  3 mL Intravenous Q12H  . warfarin  10 mg Oral Once  . Warfarin - Pharmacist Dosing Inpatient   Does not apply q1800   Continuous Infusions:   Principal Problem:   Hyponatremia Active Problems:   DIABETES MELLITUS, TYPE II   HYPERLIPIDEMIA   Eosinophilia   HYPERTENSION   Atrial fibrillation, chronic   GERD   BREAST CANCER, HX OF   Chronic anticoagulation   Sleep apnea   Aortic valve disease   Depression   Hypothyroidism   Gait instability   Difficulty hearing   Brain aneurysm   Compression fracture   Weakness   Osteoarthritis of both knees   Vomiting and Abdominal Pain   Spastic dysuria   Atrophic vaginitis   Chronic cough   Weight loss   History of Concussion   Brendia Sacks, MD  [name] Pager [phone] If 7PM-7AM, please contact night-coverage at [email or web address], password [facility] 01/20/2013, 11:48 AM  LOS: 6 days   Time spent: 15 minutes

## 2013-01-21 ENCOUNTER — Telehealth: Payer: Self-pay

## 2013-01-21 LAB — PROTIME-INR
INR: 1.68 — ABNORMAL HIGH (ref 0.00–1.49)
Prothrombin Time: 19.2 seconds — ABNORMAL HIGH (ref 11.6–15.2)

## 2013-01-21 LAB — BASIC METABOLIC PANEL
BUN: 10 mg/dL (ref 6–23)
Calcium: 9 mg/dL (ref 8.4–10.5)
Creatinine, Ser: 0.74 mg/dL (ref 0.50–1.10)
GFR calc Af Amer: >90 mL/min (ref >90)
GFR calc non Af Amer: 79 mL/min — ABNORMAL LOW (ref >90)

## 2013-01-21 LAB — GLUCOSE, CAPILLARY: Glucose-Capillary: 84 mg/dL (ref 70–99)

## 2013-01-21 MED ORDER — ACETAMINOPHEN ER 650 MG PO TBCR: 650.0000 mg | EXTENDED_RELEASE_TABLET | Freq: Three times a day (TID) | ORAL | Status: DC | PRN

## 2013-01-21 MED ORDER — DEMECLOCYCLINE HCL 150 MG PO TABS: 300.0000 mg | ORAL_TABLET | Freq: Three times a day (TID) | ORAL | Status: DC

## 2013-01-21 NOTE — Telephone Encounter (Signed)
Return to coumadin clinic

## 2013-01-21 NOTE — Progress Notes (Signed)
Patient with orders to be discharge home. Discharge instructions given to patient and daughter, verbalized understanding. Patient in stable condition upon discharge. Patient left with family in private vehicle.

## 2013-01-21 NOTE — Progress Notes (Signed)
Subjective: Interval History: has no complaint of nausea or vomiting. Have pain has improved..  Objective: Vital signs in last 24 hours: Temp:  [97.3 F (36.3 C)-97.9 F (36.6 C)] 97.3 F (36.3 C) (04/22 0518) Pulse Rate:  [53-71] 71 (04/22 0518) Resp:  [17-20] 17 (04/22 0518) BP: (118-138)/(64-74) 132/64 mmHg (04/22 0518) SpO2:  [93 %-95 %] 95 % (04/22 0518) Weight:  [98 kg (216 lb 0.8 oz)] 98 kg (216 lb 0.8 oz) (04/22 0518) Weight change: -1 kg (-2 lb 3.3 oz)  Intake/Output from previous day: 04/21 0701 - 04/22 0700 In: 240 [P.O.:240] Out: -  Intake/Output this shift:    General appearance: alert, cooperative and no distress Resp: clear to auscultation bilaterally Cardio: regular rate and rhythm, S1, S2 normal, no murmur, click, rub or gallop GI: soft, non-tender; bowel sounds normal; no masses,  no organomegaly Extremities: extremities normal, atraumatic, no cyanosis or edema  Lab Results:  Recent Labs  01/19/13 0655  WBC 4.5  HGB 11.8*  HCT 32.7*  PLT 234   BMET:  Recent Labs  01/20/13 0530 01/21/13 0558  NA 127* 129*  K 4.4 4.3  CL 91* 92*  CO2 28 28  GLUCOSE 90 87  BUN 9 10  CREATININE 0.62 0.74  CALCIUM 9.2 9.0   No results found for this basename: PTH,  in the last 72 hours Iron Studies: No results found for this basename: IRON, TIBC, TRANSFERRIN, FERRITIN,  in the last 72 hours  Studies/Results: No results found.  I have reviewed the patient's current medications.  Assessment/Plan Problem #1 hyponatremia sodium 129 improving. Patient presently asymptomatic and the weakness has improved.  Problem #2 sleep apnea Problem #3 hypertension her blood pressure seems to be reasonably controlled Problem #4 diabetes  Plan: Continue his present management Her follow patient as outpatient when she is discharged.   LOS: 7 days   Vinia Jemmott S 01/21/2013,8:51 AM

## 2013-01-21 NOTE — Discharge Summary (Signed)
Physician Discharge Summary  Kristin Logan:[id number] DOB: 1931/11/30 DOA: 01/14/2013  PCP: Kristin Antis, MD  Admit date: 01/14/2013 Discharge date: 01/21/2013  Recommendations for Outpatient Follow-up:  1. Followup hyponatremia, consider repeat basic metabolic panel on followup. 2. Followup anticoagulation with warfarin for atrial fibrillation. I have discussed with Dr. Deirdre Logan nurse, she will schedule PT/INR through home health in the next 48 hours.  Lab Results  Component Value Date   INR 1.68* 01/21/2013   INR 1.75* 01/20/2013   INR 1.93* 01/19/2013    3. Resume home health with RN. 4. Ground-glass attenuation 7 mm right upper lobe pulmonary nodule.  Initial follow-up by chest CT without contrast is recommended in 3 months to confirm persistence.  Follow-up Information   Follow up with [facility] S, MD In 4 weeks.   Contact information:   [address] [place] [address] [phone]       Follow up with Kristin Antis, MD In 1 week.   Contact information:   [address] [place] [address] [phone]      Discharge Diagnoses:  1. Hypovolemic hyponatremia, possible SIADH 2. Nausea, vomiting, abdominal pain 3. Acute encephalopathy 4. Atrial fibrillation on chronic anticoagulation 5. Asymptomatic umbilical hernia  Discharge Condition: Improved Disposition: Return home with home health RN  Diet recommendation: Regular  Filed Weights   01/19/13 0619 01/20/13 0517 01/21/13 0518  Weight: 98.1 kg (216 lb 4.3 oz) 99 kg (218 lb 4.1 oz) 98 kg (216 lb 0.8 oz)    History of present illness:  79 year old woman presented with nausea, vomiting and was admitted for hyponatremia.  Hospital Course:  Ms. Kristin Logan was admitted for further treatment of hyponatremia, nausea and vomiting. She was started on isotonic saline but sodium dropped. She was then treated with fluid restriction and Lasix. However sodium failed to significantly  improve the nephrology consultation was placed. Water restriction, salt tablet and Lasix was instituted without significant increase in sodium level. Therefore demeclocycline was started with appropriate rise in sodium. She will continue on this medication discharge for one month as per my discussion with nephrology with followup with nephrology in 4 weeks. Hospitalization was complicated by acute encephalopathy which resolved in sodium levels improved. Comorbidities remain stable.  1. Hypovolemic hyponatremia: Remains asymptomatic. Sodium continues to improve. Continue free water restriction and demeclocycline. Appreciate nephrology consultation and recommendations. Sodium dropped in response to isotonic saline 4/16. CT of the chest abdomen and pelvis unremarkable with no evidence of malignancy.   2. Nausea, vomiting, lower abdominal pain times one week: Resolved. 3. Acute encephalopathy, delirium: Presumed secondary to hyponatremia. Resolved. 4. Diabetes mellitus: Well controlled. Sliding-scale insulin. 5. Eosinophilia: Etiology unclear. Long-standing by history. IgE normal. Started Claritin and H2 blocker. Followup as outpatient. 6. Dysuria, Vaginitis, Perineal Maceration/Rash: Long-standing issue per patient and daughter, has been on some kind of vaginal cream for many years. No obvious urine obstruction, good urine output, no hydro on CT or bladder wall thickening, consider outpatient urology evaluation. 7. Atrial fibrillation: Rate controlled. Continue warfarin per pharmacy. 8. Obstructive sleep apnea: Continue CPAP 9. Status post aortic valve replacement: Tissue valve per daughter. 10. Umbilical hernia: Asymptomatic. Follow clinically. Surgical consultation appreciated. 11. Chronic pain from her low back DJD, severe knee OA, and pain from prior injuries related to falls at home. 12. History of left hip fracture 11/2012: Being treated conservatively for  this.   Consultants:  Nephrology  General surgery for umbilical hernia. No followup recommended.  Physical therapy: No  followup needed.  Procedures: None.  Discharge Instructions  Discharge Orders   Future Appointments Provider Department Dept Phone   01/23/2013 3:10 PM Kristin Logan [phone]   01/30/2013 4:00 PM Kristin Hearing, MD [facility]in [place] [phone]   02/14/2013 3:30 PM Kristin Scarlet, MD [place] [phone]   03/31/2013 1:15 PM Kristin Scarlet, MD [facility] [place] [phone]   Future Orders Complete By Expires     Discharge instructions  As directed     Comments:      You were started on demeclocycline to help increase sodium levels. Monitor for nausea, vomiting or confusion. Call your physician for change in condition or worsening. Regular diet. Limit water intake to 500 mL per day. May consume other liquids that contain sodium as desired (including Gatorade and V8). Take 10 mg of Kristin this evening 4/22 and the evening of 4/23. Then resume 5 mg daily on 4/24.    Increase activity slowly  As directed         Medication List    STOP taking these medications       buPROPion 75 MG tablet  Commonly known as:  WELLBUTRIN     citalopram 40 MG tablet  Commonly known as:  CELEXA     promethazine 12.5 MG tablet  Commonly known as:  PHENERGAN      TAKE these medications       acetaminophen 650 MG CR tablet  Commonly known as:  TYLENOL  Take 1 tablet (650 mg total) by mouth every 8 (eight) hours as needed for pain.     albuterol 108 (90 BASE) MCG/ACT inhaler  Commonly known as:  PROVENTIL HFA;VENTOLIN HFA  Inhale 2 puffs into the lungs every 4 (four) hours as needed for wheezing.     albuterol (2.5 MG/3ML) 0.083% nebulizer solution  Commonly known as:  PROVENTIL  Take 3 mLs (2.5 mg total) by nebulization every 6 (six) hours as needed for wheezing.      amLODipine 5 MG tablet  Commonly known as:  NORVASC  Take 5 mg by mouth daily.     calcium carbonate 600 MG Tabs  Commonly known as:  OS-CAL  Take 600 mg by mouth daily.     clotrimazole-betamethasone cream  Commonly known as:  LOTRISONE  Apply 1 application topically 2 (two) times daily.     demeclocycline 150 MG tablet  Commonly known as:  DECLOMYCIN  Take 2 tablets (300 mg total) by mouth every 8 (eight) hours.     diclofenac sodium 1 % Gel  Commonly known as:  VOLTAREN  Apply 2 g topically daily as needed (Pain).     furosemide 40 MG tablet  Commonly known as:  LASIX  Take 1 tablet (40 mg total) by mouth daily. For fluid     glimepiride 1 MG tablet  Commonly known as:  AMARYL  Take 1 mg by mouth daily before breakfast.     HYDROcodone-acetaminophen 5-325 MG per tablet  Commonly known as:  NORCO  Take 1 tablet by mouth every 6 (six) hours as needed for pain.     levothyroxine 50 MCG tablet  Commonly known as:  SYNTHROID, LEVOTHROID  Take 50 mcg by mouth daily.     lisinopril 40 MG tablet  Commonly known as:  PRINIVIL,ZESTRIL  Take 40 mg by mouth daily.     metoprolol succinate 25 MG 24 hr tablet  Commonly known as:  TOPROL-XL  Take  25 mg by mouth daily.     multivitamin tablet  Take 1 tablet by mouth daily.     omeprazole 20 MG capsule  Commonly known as:  PRILOSEC  Take 1 capsule (20 mg total) by mouth 2 (two) times daily. For stomach acid     polyethylene glycol packet  Commonly known as:  MIRALAX / GLYCOLAX  Take 17 g by mouth 2 (two) times daily.     potassium chloride SA 20 MEQ tablet  Commonly known as:  K-DUR,KLOR-CON  Take 1 tablet (20 mEq total) by mouth daily. For potassium and fluid pill     simvastatin 20 MG tablet  Commonly known as:  ZOCOR  Take 1 tablet (20 mg total) by mouth at bedtime.     warfarin 5 MG tablet  Commonly known as:  Kristin  Take 5-10 mg by mouth daily. Takes 5 mg every day except on Tues when she takes 10 mg.          The results of significant diagnostics from this hospitalization (including imaging, microbiology, ancillary and laboratory) are listed below for reference.    Significant Diagnostic Studies: Dg Chest 2 View  01/14/2013  *RADIOLOGY REPORT*  Clinical Data: Vomiting.  Weakness.  CHEST - 2 VIEW  Comparison: 02/10, 02/09, and 10/15/2012  Findings: There is chronic cardiomegaly.  Pulmonary vascularity is normal.  Lungs are clear.  No effusions.  No acute osseous abnormality.  IMPRESSION: No acute abnormality.  Chronic cardiomegaly.   Original Report Authenticated By: Francene Boyers, M.D.    Ct Head Wo Contrast  01/14/2013  *RADIOLOGY REPORT*  Clinical Data: Altered mental status  CT HEAD WITHOUT CONTRAST  Technique:  Contiguous axial images were obtained from the base of the skull through the vertex without contrast.  Comparison: 04/02/2012  Findings: No skull fracture is noted.  Paranasal sinuses and mastoid air cells are unremarkable.  No intracranial hemorrhage, mass effect or midline shift.  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  No acute infarction.  No mass lesion is noted on this unenhanced scan.  Stable atrophy.  Stable patchy subcortical chronic white matter disease.  IMPRESSION: No acute intracranial abnormality.  Stable atrophy and chronic white matter disease.  No definite acute cortical infarction.   Original Report Authenticated By: Natasha Mead, M.D.    Ct Chest W Contrast  01/15/2013  *RADIOLOGY REPORT*  Clinical Data: Cough.  Back pain.  CT CHEST WITH CONTRAST  Technique:  Multidetector CT imaging of the chest was performed following the standard protocol during bolus administration of intravenous contrast.  Contrast: 80mL OMNIPAQUE IOHEXOL 300 MG/ML  SOLN  Comparison: Chest radiograph 01/15/2003.  Findings: Cardiomegaly is present with the right atrial and left atrial enlargement.  Aortic valve replacement is present with median sternotomy.   Coronary artery atherosclerosis.  Mitral calcification is present.  There is no pericardial effusion.  Small hiatal hernia and patulous thoracic esophagus.  There is no axillary adenopathy.  Right axillary nodal dissection.  Precarinal lymph node is borderline, measuring 1 cm.  No hilar adenopathy.  Incidental visualization of the upper abdomen is within normal limits.  Partially visualized L1 superior endplate compression fracture.  Mid thoracic compression fracture at T6, with about 25% loss of vertebral body height, probably chronic.  Extensive aortic atherosclerosis.  No aneurysm.  The lungs demonstrate atelectasis and scarring.  7 mm ground-glass attenuation nodule in the left upper lobe (image number 15 series 3).  Central airways  appear patent.  No aggressive osseous lesions. No convincing evidence of bronchial wall thickening.  IMPRESSION: 1.  No acute abnormality. 2.  Cardiomegaly with atrial enlargement. 3.  Ground-glass attenuation 7 mm right upper lobe pulmonary nodule.  Initial follow-up by chest CT without contrast is recommended in 3 months to confirm persistence.   This recommendation follows the consensus statement: Recommendations for the Management of Subsolid Pulmonary Nodules Detected at CT:  A Statement from the Fleischner Society as published in Radiology 2013; 266:304-317.   Original Report Authenticated By: Andreas Newport, M.D.    Ct Abdomen Pelvis W Contrast  01/14/2013  *RADIOLOGY REPORT*  Clinical Data: Vomiting, abdominal pain  CT ABDOMEN AND PELVIS WITH CONTRAST  Technique:  Multidetector CT imaging of the abdomen and pelvis was performed following the standard protocol during bolus administration of intravenous contrast.  Contrast: 50mL OMNIPAQUE IOHEXOL 300 MG/ML  SOLN, OMNIPAQUE IOHEXOL 300 MG/ML  SOLN  Comparison: Lumbar spine x-rays 04/02/2012.  Findings: Lung bases are unremarkable.  Degenerative changes are noted lumbar spine at L5 S1 level.  There is diffuse osteopenia.  Stable compression fractures of L1 and L2 vertebral bodies.  Enhanced liver is unremarkable.  Cardiomegaly is noted.  Pancreas, spleen and adrenal glands are unremarkable.  Atherosclerotic calcifications of the abdominal aorta and the iliac arteries.  No aortic aneurysm.  Mild distended gallbladder without pericholecystic fluid.  Probable tiny gallstones within gallbladder the largest measures 3 mm.  The kidneys are symmetrical in size and enhancement.  Mild lobulated renal contour.  No hydronephrosis or hydroureter.  Delayed renal images shows bilateral renal symmetrical excretion.  There is no small bowel obstruction.  Limited assessment of distal small bowel which is non opacified with contrast.  There is moderate gaseous distention of the transverse colon.  No pericecal inflammation.  The sigmoid colon is empty collapsed. Multiple sigmoid colon diverticula are noted.  No evidence of acute diverticulitis.  The splenic flexure and the colon left colon are empty collapsed.  Atrophic uterus is noted.  The urinary bladder is unremarkable. There is a small umbilical hernia containing a short segment of the anterior wall of the transverse colon without evidence of acute complication.  IMPRESSION:  1.  Degenerative changes lumbar spine.  Diffuse osteopenia.  Stable compression fractures of L1 and L2 vertebral bodies. 2.  Atherosclerotic calcifications of the abdominal aorta and the iliac arteries.  No aortic aneurysm. 3.  Probable tiny gallstones within gallbladder the largest measures 3 mm.  No pericholecystic fluid. 4.  No hydronephrosis or hydroureter. 5.  No bowel obstruction.  Moderate gaseous distention of the transverse colon.  There is a small umbilical hernia containing short segment of the anterior wall of the transverse colon without evidence of acute inflammation or complication.  6.  Sigmoid colon diverticula are noted.  No evidence of acute diverticulitis. 7.  Cardiomegaly is noted.   Original Report  Authenticated By: Natasha Mead, M.D.    Microbiology: Recent Results (from the past 240 hour(s))  URINE CULTURE     Status: None   Collection Time    01/14/13  4:14 PM      Result Value Range Status   Specimen Description URINE, CATHETERIZED   Final   Special Requests NONE   Final   Culture  Setup Time 01/14/2013 18:50   Final   Colony Count NO GROWTH   Final   Culture NO GROWTH   Final   Report Status 01/15/2013 FINAL   Final  Labs: Basic Metabolic Panel:  Recent Labs Lab 01/17/13 0532 01/18/13 0650 01/19/13 0655 01/20/13 0530 01/21/13 0558  NA 122* 124* 123* 127* 129*  K 3.7 3.8 3.9 4.4 4.3  CL 86* 87* 88* 91* 92*  CO2 26 27 27 28 28   GLUCOSE 86 85 81 90 87  BUN 4* 5* 5* 9 10  CREATININE 0.48* 0.53 0.52 0.62 0.74  CALCIUM 8.6 9.0 8.9 9.2 9.0   Liver Function Tests:  Recent Labs Lab 01/14/13 1549  AST 21  ALT 8  ALKPHOS 72  BILITOT 0.5  PROT 7.2  ALBUMIN 3.7    Recent Labs Lab 01/14/13 1549  LIPASE 18   CBC:  Recent Labs Lab 01/14/13 1549 01/15/13 0543 01/19/13 0655  WBC 8.9 6.7 4.5  NEUTROABS 5.1  --   --   HGB 13.5 12.2 11.8*  HCT 37.0 34.2* 32.7*  MCV 78.2 79.9 80.1  PLT 267 247 234   Cardiac Enzymes:  Recent Labs Lab 01/14/13 1604 01/15/13 0543  TROPONINI <0.30 <0.30     Recent Labs  11/11/12 0108 01/14/13 2215  PROBNP 354.6 386.4   CBG:  Recent Labs Lab 01/20/13 1141 01/20/13 1606 01/20/13 2111 01/21/13 0758 01/21/13 1139  GLUCAP 102* 98 117* 84 104*    Principal Problem:   Hyponatremia Active Problems:   DIABETES MELLITUS, TYPE II   HYPERLIPIDEMIA   Eosinophilia   HYPERTENSION   Atrial fibrillation, chronic   GERD   BREAST CANCER, HX OF   Chronic anticoagulation   Sleep apnea   Aortic valve disease   Depression   Hypothyroidism   Gait instability   Difficulty [place]   Brain aneurysm   Compression fracture   Weakness   Osteoarthritis of both knees   Vomiting and Abdominal Pain   Spastic  dysuria   Atrophic vaginitis   Chronic cough   Weight loss   History of Concussion   Time coordinating discharge: 35 minutes  Signed:  Brendia Sacks, MD [name] 01/21/2013, 1:31 PM

## 2013-01-21 NOTE — Progress Notes (Signed)
[name] PROGRESS NOTE  Kristin Logan ZOX:[id number] DOB: 10-Jan-1932 DOA: 01/14/2013 PCP: [name], MD  Assessment/Plan: 1. Hypovolemic hyponatremia: Remains asymptomatic. Sodium continues to improve. Continue free water restriction and demeclocycline. Appreciate nephrology consultation and recommendations. Sodium dropped in response to isotonic saline 4/16. CT of the chest abdomen and pelvis unremarkable with no evidence of malignancy.  2. Nausea, vomiting, lower abdominal pain times one week: Resolved. 3. Acute encephalopathy, delirium: Presumed secondary to hyponatremia. Resolved. 4. Diabetes mellitus: Well controlled. Sliding-scale insulin. 5. Eosinophilia: Etiology unclear. Long-standing by history. IgE normal. Started Claritin and H2 blocker. Followup as outpatient. 6. Dysuria, Vaginitis, Perineal Maceration/Rash: Long-standing issue per patient and daughter, has been on some kind of vaginal cream for many years. No obvious urine obstruction, good urine output, no hydro on CT or bladder wall thickening, consider outpatient urology evaluation. 7. Atrial fibrillation: Rate controlled. Continue warfarin per pharmacy. 8. Obstructive sleep apnea: Continue CPAP 9. Status post aortic valve replacement: Tissue valve per daughter. 10. Umbilical hernia: Asymptomatic. Follow clinically. Surgical consultation appreciated. 11. Chronic pain from her low back DJD, severe knee OA, and pain from prior injuries related to falls at home. 12. History of left hip fracture 11/2012: Being treated conservatively for this.  Continues to improve. Discussed with daughter at bedside. Plan discharge home today. Reviewed medication changes.  Code Status: Full code Family Communication: As above. Disposition Plan: Home with home health RN  Brendia Sacks, MD  [name]  Pager [phone] If 7PM-7AM, please contact night-coverage at [email or web address], password [facility] 01/21/2013, 1:22 PM  LOS: 7  days   Brief narrative: 79 year old woman presented with nausea, vomiting and was admitted for hyponatremia.  Consultants:  Nephrology  Physical therapy: No followup needed.  Procedures:    HPI/Subjective: Continues to feel better. No nausea or vomiting. Eating well. No significant pain.  Objective: Filed Vitals:   01/20/13 0517 01/20/13 1500 01/20/13 2131 01/21/13 0518  BP: 139/71 138/74 118/67 132/64  Pulse: 51 55 53 71  Temp: 97.3 F (36.3 C) 97.9 F (36.6 C) 97.9 F (36.6 C) 97.3 F (36.3 C)  TempSrc: Oral Oral Oral Oral  Resp: 18 18 20 17   Height:      Weight: 99 kg (218 lb 4.1 oz)   98 kg (216 lb 0.8 oz)  SpO2: 93% 93% 94% 95%    Intake/Output Summary (Last 24 hours) at 01/21/13 1322 Last data filed at 01/20/13 1800  Gross per 24 hour  Intake    240 ml  Output      0 ml  Net    240 ml   Filed Weights   01/19/13 0619 01/20/13 0517 01/21/13 0518  Weight: 98.1 kg (216 lb 4.3 oz) 99 kg (218 lb 4.1 oz) 98 kg (216 lb 0.8 oz)    Exam:  General:  Appears calm and comfortable. Very alert. Speech fluent and clear. Cardiovascular: RRR, 3/6 holosystolic murmur, no r/g.  Respiratory: CTA bilaterally, no w/r/r. Normal respiratory effort. Psychiatric: grossly normal.    Data Reviewed: Basic Metabolic Panel:  Recent Labs Lab 01/17/13 0532 01/18/13 0650 01/19/13 0655 01/20/13 0530 01/21/13 0558  NA 122* 124* 123* 127* 129*  K 3.7 3.8 3.9 4.4 4.3  CL 86* 87* 88* 91* 92*  CO2 26 27 27 28 28   GLUCOSE 86 85 81 90 87  BUN 4* 5* 5* 9 10  CREATININE 0.48* 0.53 0.52 0.62 0.74  CALCIUM 8.6 9.0 8.9 9.2 9.0   Liver Function Tests:  Recent Labs Lab 01/14/13  1549  AST 21  ALT 8  ALKPHOS 72  BILITOT 0.5  PROT 7.2  ALBUMIN 3.7    Recent Labs Lab 01/14/13 1549  LIPASE 18   CBC:  Recent Labs Lab 01/14/13 1549 01/15/13 0543 01/19/13 0655  WBC 8.9 6.7 4.5  NEUTROABS 5.1  --   --   HGB 13.5 12.2 11.8*  HCT 37.0 34.2* 32.7*  MCV 78.2 79.9 80.1   PLT 267 247 234   Cardiac Enzymes:  Recent Labs Lab 01/14/13 1604 01/15/13 0543  TROPONINI <0.30 <0.30    Recent Labs  11/11/12 0108 01/14/13 2215  PROBNP 354.6 386.4   CBG:  Recent Labs Lab 01/20/13 1141 01/20/13 1606 01/20/13 2111 01/21/13 0758 01/21/13 1139  GLUCAP 102* 98 117* 84 104*    Studies: No results found.  Scheduled Meds: . clotrimazole   Topical BID  . demeclocycline  300 mg Oral Q8H  . furosemide  40 mg Oral Daily  . hydrocortisone   Topical BID  . insulin aspart  0-15 Units Subcutaneous TID WC  . insulin aspart  0-5 Units Subcutaneous QHS  . loratadine  10 mg Oral Daily  . metoprolol succinate  25 mg Oral Daily  . simvastatin  20 mg Oral QHS  . sodium chloride  3 mL Intravenous Q12H  . Warfarin - Pharmacist Dosing Inpatient   Does not apply q1800   Continuous Infusions:   Principal Problem:   Hyponatremia Active Problems:   DIABETES MELLITUS, TYPE II   HYPERLIPIDEMIA   Eosinophilia   HYPERTENSION   Atrial fibrillation, chronic   GERD   BREAST CANCER, HX OF   Chronic anticoagulation   Sleep apnea   Aortic valve disease   Depression   Hypothyroidism   Gait instability   Difficulty hearing   Brain aneurysm   Compression fracture   Weakness   Osteoarthritis of both knees   Vomiting and Abdominal Pain   Spastic dysuria   Atrophic vaginitis   Chronic cough   Weight loss   History of Concussion   Brendia Sacks, MD  [name] Pager [phone] If 7PM-7AM, please contact night-coverage at [email or web address], password [facility] 01/21/2013, 1:22 PM  LOS: 7 days

## 2013-01-22 ENCOUNTER — Other Ambulatory Visit: Payer: Self-pay | Admitting: Family Medicine

## 2013-01-22 NOTE — Telephone Encounter (Signed)
Daughter aware that patient is to have coumadin dosed by the Coumadin clinic.  She has a followup appt 4/24 @ 3:10.

## 2013-01-23 ENCOUNTER — Telehealth: Payer: Self-pay | Admitting: Family Medicine

## 2013-01-23 NOTE — Telephone Encounter (Signed)
Spoke with daughter and she states that [employer] needs more documentation in order for patient to receive equipment.  Daughter also states that patient does not qualify for scooter and will have to get power wheelchair.  Call to be made to [facility] for more information

## 2013-01-24 ENCOUNTER — Telehealth: Payer: Self-pay | Admitting: *Deleted

## 2013-01-24 ENCOUNTER — Other Ambulatory Visit: Payer: Self-pay | Admitting: Family Medicine

## 2013-01-24 NOTE — Telephone Encounter (Signed)
Message copied by Kyung Rudd on Fri Jan 24, 2013  3:48 PM ------      Message from: Burnice Logan      Created: Fri Jan 24, 2013 12:17 PM      Regarding: [facility] [facility]       Pt admitted to hospital [facility] called wanting to know if it would be ok if they check her Pt levels and call you with results. Give [facility] [facility] a call back at [phone] ------

## 2013-01-27 ENCOUNTER — Telehealth: Payer: Self-pay | Admitting: Family Medicine

## 2013-01-27 ENCOUNTER — Ambulatory Visit (INDEPENDENT_AMBULATORY_CARE_PROVIDER_SITE_OTHER): Payer: Medicare Other | Admitting: *Deleted

## 2013-01-27 ENCOUNTER — Telehealth: Payer: Self-pay | Admitting: Cardiology

## 2013-01-27 DIAGNOSIS — I482 Chronic atrial fibrillation, unspecified: Secondary | ICD-10-CM

## 2013-01-27 DIAGNOSIS — I4891 Unspecified atrial fibrillation: Secondary | ICD-10-CM

## 2013-01-27 DIAGNOSIS — Z7901 Long term (current) use of anticoagulants: Secondary | ICD-10-CM

## 2013-01-27 MED ORDER — BISACODYL 10 MG RE SUPP: 10.0000 mg | Freq: Every day | RECTAL | Status: DC | PRN

## 2013-01-27 NOTE — Telephone Encounter (Signed)
Please call Olegario Messier back

## 2013-01-27 NOTE — Telephone Encounter (Signed)
Dulculax suppository sent in. She can stop the miralax if making her sick on stomach

## 2013-01-27 NOTE — Telephone Encounter (Signed)
Daughter aware.

## 2013-01-27 NOTE — Telephone Encounter (Signed)
See coumadin note. 

## 2013-01-27 NOTE — Telephone Encounter (Signed)
Hasn't been able to have a bowel movement in awhile - 1 since she has been home. Stool is hard. Hasn't been able to keep down the miralax. It makes her nauseated and it comes back up.  Was given a suppository while in the hospital and she was able to go. Please advise

## 2013-01-28 ENCOUNTER — Other Ambulatory Visit: Payer: Self-pay

## 2013-01-28 ENCOUNTER — Encounter: Payer: Self-pay | Admitting: *Deleted

## 2013-01-28 ENCOUNTER — Telehealth: Payer: Self-pay | Admitting: Family Medicine

## 2013-01-28 ENCOUNTER — Ambulatory Visit: Payer: Medicare Other | Admitting: Family Medicine

## 2013-01-28 MED ORDER — PROMETHAZINE HCL 12.5 MG RE SUPP: 12.5000 mg | Freq: Four times a day (QID) | RECTAL | Status: DC | PRN

## 2013-01-28 MED ORDER — SIMVASTATIN 20 MG PO TABS: 20.0000 mg | ORAL_TABLET | Freq: Every day | ORAL | Status: DC

## 2013-01-28 NOTE — Telephone Encounter (Signed)
Spoke with patient and her daughter. Since she got out of the hospital she's been having episodes of vomiting mostly in the morning. She's also been severely constipated. They were unable to get the suppositories I called in yesterday . She's tried taking phenergan however has not been able to keep this down. Regarding her hyponatremia she was given sodium tablets. She has been vomiting more today try taking sips of fluids however these have not stayed down. Her daughter states she is very weak. Advise and go to the emergency room for evaluation IV fluids as this cannot be done in the office but the patient declines at this time because she does not want to go back to the hospital and she was recently admitted. He is in favor Phenergan suppositories and Dulcolax suppository. Her daughter will try the tablets again and she has these at home already. She understands to take her to the emergency room if she does not improve

## 2013-01-28 NOTE — Telephone Encounter (Signed)
[employer] to send more information

## 2013-01-28 NOTE — Telephone Encounter (Signed)
Noted. Dr Jeanice Lim spoke with family and sent in phenergan suppositories

## 2013-01-30 ENCOUNTER — Ambulatory Visit (INDEPENDENT_AMBULATORY_CARE_PROVIDER_SITE_OTHER): Payer: Medicare Other | Admitting: Family Medicine

## 2013-01-30 ENCOUNTER — Encounter: Payer: Self-pay | Admitting: Family Medicine

## 2013-01-30 ENCOUNTER — Ambulatory Visit: Payer: Medicare Other | Admitting: Orthopedic Surgery

## 2013-01-30 VITALS — BP 138/80 | HR 74 | Resp 16 | Wt 207.0 lb

## 2013-01-30 DIAGNOSIS — K5900 Constipation, unspecified: Secondary | ICD-10-CM | POA: Insufficient documentation

## 2013-01-30 DIAGNOSIS — F329 Major depressive disorder, single episode, unspecified: Secondary | ICD-10-CM

## 2013-01-30 DIAGNOSIS — E871 Hypo-osmolality and hyponatremia: Secondary | ICD-10-CM

## 2013-01-30 DIAGNOSIS — R1110 Vomiting, unspecified: Secondary | ICD-10-CM

## 2013-01-30 DIAGNOSIS — K219 Gastro-esophageal reflux disease without esophagitis: Secondary | ICD-10-CM

## 2013-01-30 DIAGNOSIS — F3289 Other specified depressive episodes: Secondary | ICD-10-CM

## 2013-01-30 DIAGNOSIS — F32A Depression, unspecified: Secondary | ICD-10-CM

## 2013-01-30 LAB — CBC WITH DIFFERENTIAL/PLATELET
Basophils Absolute: 0.1 10*3/uL (ref 0.0–0.1)
Basophils Relative: 1 % (ref 0–1)
Eosinophils Relative: 11 % — ABNORMAL HIGH (ref 0–5)
Lymphocytes Relative: 37 % (ref 12–46)
MCV: 82.8 fL (ref 78.0–100.0)
Neutro Abs: 3.1 10*3/uL (ref 1.7–7.7)
Platelets: 387 10*3/uL (ref 150–400)
RDW: 15.2 % (ref 11.5–15.5)
WBC: 7.4 10*3/uL (ref 4.0–10.5)

## 2013-01-30 NOTE — Assessment & Plan Note (Signed)
Continue PPI ?

## 2013-01-30 NOTE — Progress Notes (Signed)
  Subjective:    Patient ID: Kristin Logan, female    DOB: Sep 10, 1932, 79 y.o.   MRN: [id number]  HPI  Pt here to f/u recent admission for N/V, found to be hyponatremic again, celexa and wellbutrin discontinued. Pt had significant work up, including water restriction, diuretic challenge, salt tabs, nephrology, no specific cause was found. Started on demeclocyline off label to help with SIADH. Past few days has had severe constipation and Nausea vomiting, see phone notes. Able to have BM Tuesday pm and yesterday after suppository, continues to have nausea, no emesis since Tuesday, feels better since BM. Denies fever. She is on 70 ounce/day water restriction and this is very difficult for here Also states she gets a stretching feels in her stomach, told by hospital she has a hiatal hernia, tells me 50 years ago had a stomach ulcer.  Has not seen pulmonary  Missed f/u ortho No appt for renal set up   Review of Systems  GEN- + fatigue, fever, weight loss,weakness, recent illness HEENT- denies eye drainage, change in vision, nasal discharge, CVS- denies chest pain, palpitations RESP- denies SOB, cough, wheeze ABD- + N/V, +change in stools, abd pain GU- denies dysuria, hematuria, dribbling, incontinence MSK- + joint pain, muscle aches, injury Neuro- denies headache, dizziness, syncope, seizure activity      Objective:   Physical Exam GEN- NAD, alert and oriented x 3 , HEENT-PEERL, EOMI , oropharynx clear,  CVS- irregular rhythem, normal rate  2/6 SEM RESP-, CTAB  ABD-NABS,soft,NT,ND Ext- no swelling Pulse- Radial 2+, DP 2+ Psych- normal affect and mood        Assessment & Plan:

## 2013-01-30 NOTE — Patient Instructions (Addendum)
Labs today Get the sponges for your mouth  Continue nausea medication Continue omeprazole - for acid Referral  To Kidney doctor for the sodium I will call [facility]- Wheelchair- Motorized F/U Cancel appt for the  May 16th , Keep appt June

## 2013-01-30 NOTE — Assessment & Plan Note (Signed)
Repeat BMET today, referral to nephrology Continue the tetracycline

## 2013-01-30 NOTE — Assessment & Plan Note (Signed)
Check CBC with diff as well Vomiting improved ? Related to the antibiotic for her SIADH

## 2013-01-30 NOTE — Assessment & Plan Note (Signed)
As most SSRI can cause hyponatremia will hold off. Wellbutrin does not typically cause this. Will check sodium levels again, have her f/u nephrology and then discuss restart wellbutrin

## 2013-01-30 NOTE — Assessment & Plan Note (Signed)
Improved with suppository use

## 2013-01-31 ENCOUNTER — Telehealth: Payer: Self-pay | Admitting: Family Medicine

## 2013-01-31 ENCOUNTER — Ambulatory Visit (INDEPENDENT_AMBULATORY_CARE_PROVIDER_SITE_OTHER): Payer: Medicare Other | Admitting: *Deleted

## 2013-01-31 DIAGNOSIS — Z7901 Long term (current) use of anticoagulants: Secondary | ICD-10-CM

## 2013-01-31 DIAGNOSIS — I482 Chronic atrial fibrillation, unspecified: Secondary | ICD-10-CM

## 2013-01-31 DIAGNOSIS — I4891 Unspecified atrial fibrillation: Secondary | ICD-10-CM

## 2013-01-31 LAB — BASIC METABOLIC PANEL
BUN: 12 mg/dL (ref 6–23)
Calcium: 9.1 mg/dL (ref 8.4–10.5)
Glucose, Bld: 78 mg/dL (ref 70–99)

## 2013-02-03 ENCOUNTER — Telehealth: Payer: Self-pay | Admitting: Family Medicine

## 2013-02-03 MED ORDER — BUPROPION HCL 75 MG PO TABS: 37.5000 mg | ORAL_TABLET | Freq: Two times a day (BID) | ORAL | Status: DC

## 2013-02-03 NOTE — Telephone Encounter (Signed)
I spoke with patient. She states that she was talking with her daughter was a rough weekend as well as a nurse that was at the house and she states that she has been more sad and depressed and is in a lot of pain and wants her depression medicine back. She told him some time she like to go to sleep and have a good dream and not wake up. Denies SI, no plan to harm self. This weekend she was limited by her mobility was very upset she couldn't get around to do things for herself. Unfortunately she was abruptly stopped all for her depression medications and has not done well. There was concern about these causing SAIDH however do not see where  Wellbutrin can cause this. We'll go ahead and start her back on the Wellbutrin and a half a tablet 37.5 mg twice a day. Her omeprazole will be increased to twice a day as well She is being monitored by her [facility] Aide and her daughter    Tell daughter to give her 1/2 tablet of the wellbutrin (bupropion) twice a day - for 2 weeks, then 1 whole tablet twice a day Increase her omeprazole to 1 tab twice a day for the next 2 weeks to help with her stomach

## 2013-02-03 NOTE — Telephone Encounter (Signed)
Daughter aware.

## 2013-02-03 NOTE — Telephone Encounter (Signed)
Daughter is aware 

## 2013-02-05 ENCOUNTER — Ambulatory Visit (INDEPENDENT_AMBULATORY_CARE_PROVIDER_SITE_OTHER): Payer: Medicare Other | Admitting: *Deleted

## 2013-02-05 DIAGNOSIS — I4891 Unspecified atrial fibrillation: Secondary | ICD-10-CM

## 2013-02-05 DIAGNOSIS — I482 Chronic atrial fibrillation, unspecified: Secondary | ICD-10-CM

## 2013-02-05 DIAGNOSIS — Z7901 Long term (current) use of anticoagulants: Secondary | ICD-10-CM

## 2013-02-05 NOTE — Telephone Encounter (Signed)
Patient is aware 

## 2013-02-06 ENCOUNTER — Telehealth: Payer: Self-pay

## 2013-02-06 MED ORDER — LACTULOSE 10 GM/15ML PO SOLN: ORAL | Status: DC

## 2013-02-06 NOTE — Telephone Encounter (Signed)
Get a fleets enema , if this does not help give her lactulose as directed  If no BM with either of these come in tomorrow to be checked for impaction

## 2013-02-06 NOTE — Telephone Encounter (Signed)
Patient had bm today but will pickup rx for lactulose in case problem arises again.

## 2013-02-07 ENCOUNTER — Ambulatory Visit (INDEPENDENT_AMBULATORY_CARE_PROVIDER_SITE_OTHER): Payer: Medicare Other | Admitting: *Deleted

## 2013-02-07 DIAGNOSIS — I4891 Unspecified atrial fibrillation: Secondary | ICD-10-CM

## 2013-02-07 DIAGNOSIS — Z7901 Long term (current) use of anticoagulants: Secondary | ICD-10-CM

## 2013-02-07 DIAGNOSIS — I482 Chronic atrial fibrillation, unspecified: Secondary | ICD-10-CM

## 2013-02-07 LAB — POCT INR: INR: 1.8

## 2013-02-07 NOTE — Progress Notes (Signed)
Addendum:   1. POV Scooter does not meet mobility needs, as I have stated in original note she is already at level of Power Wheelchair, scooter is not as Print production planner for the home. Scooter does not provide adequate access to dining Table or vanity. Pt able to position better with wheelchair due to her ostearthritis. Decreased ROM in upper extremities  per my original NOTE   2. Chart visits provided   3. Corrections have been made electronically to note pt will had power wheelchair   4. Neuro Exam - Neuro- CNII-XII in tact, DTR symmetic, good tone, Strength 3/5 LLE, 4/5 RLE, Left UE 4/5   RUE  4/5 Left wrist- fair ROM,unsteady gait/balance, grasp decreased  Left hand compared to right  ROM Degree  Lower Ext Bilateral knee flexion contractions/flexion limited to 105 degrees , Upper ext, ROM reduction of 20%

## 2013-02-10 ENCOUNTER — Telehealth: Payer: Self-pay | Admitting: *Deleted

## 2013-02-10 ENCOUNTER — Inpatient Hospital Stay (HOSPITAL_COMMUNITY)
Admission: EM | Admit: 2013-02-10 | Discharge: 2013-02-10 | DRG: 392 | Disposition: A | Discharge status: Home / Self Care | Payer: Medicare Other | Attending: Emergency Medicine | Admitting: Emergency Medicine

## 2013-02-10 ENCOUNTER — Emergency Department (HOSPITAL_COMMUNITY): Payer: Medicare Other

## 2013-02-10 ENCOUNTER — Encounter (HOSPITAL_COMMUNITY): Payer: Self-pay | Admitting: *Deleted

## 2013-02-10 ENCOUNTER — Telehealth: Payer: Self-pay | Admitting: Family Medicine

## 2013-02-10 ENCOUNTER — Ambulatory Visit (INDEPENDENT_AMBULATORY_CARE_PROVIDER_SITE_OTHER): Payer: Medicare Other | Admitting: *Deleted

## 2013-02-10 DIAGNOSIS — Z79899 Other long term (current) drug therapy: Secondary | ICD-10-CM

## 2013-02-10 DIAGNOSIS — Z7901 Long term (current) use of anticoagulants: Secondary | ICD-10-CM

## 2013-02-10 DIAGNOSIS — I4891 Unspecified atrial fibrillation: Secondary | ICD-10-CM | POA: Diagnosis present

## 2013-02-10 DIAGNOSIS — Z823 Family history of stroke: Secondary | ICD-10-CM

## 2013-02-10 DIAGNOSIS — I359 Nonrheumatic aortic valve disorder, unspecified: Secondary | ICD-10-CM | POA: Diagnosis present

## 2013-02-10 DIAGNOSIS — K219 Gastro-esophageal reflux disease without esophagitis: Secondary | ICD-10-CM | POA: Diagnosis present

## 2013-02-10 DIAGNOSIS — E119 Type 2 diabetes mellitus without complications: Secondary | ICD-10-CM | POA: Diagnosis present

## 2013-02-10 DIAGNOSIS — M1710 Unilateral primary osteoarthritis, unspecified knee: Secondary | ICD-10-CM | POA: Diagnosis present

## 2013-02-10 DIAGNOSIS — G4730 Sleep apnea, unspecified: Secondary | ICD-10-CM | POA: Diagnosis present

## 2013-02-10 DIAGNOSIS — Z853 Personal history of malignant neoplasm of breast: Secondary | ICD-10-CM

## 2013-02-10 DIAGNOSIS — Z923 Personal history of irradiation: Secondary | ICD-10-CM

## 2013-02-10 DIAGNOSIS — E785 Hyperlipidemia, unspecified: Secondary | ICD-10-CM | POA: Diagnosis present

## 2013-02-10 DIAGNOSIS — I482 Chronic atrial fibrillation, unspecified: Secondary | ICD-10-CM

## 2013-02-10 DIAGNOSIS — Z9010 Acquired absence of unspecified breast and nipple: Secondary | ICD-10-CM

## 2013-02-10 DIAGNOSIS — Z8249 Family history of ischemic heart disease and other diseases of the circulatory system: Secondary | ICD-10-CM

## 2013-02-10 DIAGNOSIS — I10 Essential (primary) hypertension: Secondary | ICD-10-CM | POA: Diagnosis present

## 2013-02-10 DIAGNOSIS — R1110 Vomiting, unspecified: Principal | ICD-10-CM

## 2013-02-10 LAB — CBC WITH DIFFERENTIAL/PLATELET
Basophils Relative: 1 % (ref 0–1)
Eosinophils Absolute: 0.9 10*3/uL — ABNORMAL HIGH (ref 0.0–0.7)
Eosinophils Relative: 10 % — ABNORMAL HIGH (ref 0–5)
Hemoglobin: 13.2 g/dL (ref 12.0–15.0)
MCH: 28.9 pg (ref 26.0–34.0)
MCHC: 34.7 g/dL (ref 30.0–36.0)
MCV: 83.2 fL (ref 78.0–100.0)
Monocytes Absolute: 0.8 10*3/uL (ref 0.1–1.0)
Monocytes Relative: 10 % (ref 3–12)
Neutrophils Relative %: 54 % (ref 43–77)

## 2013-02-10 LAB — COMPREHENSIVE METABOLIC PANEL
Albumin: 3.6 g/dL (ref 3.5–5.2)
BUN: 15 mg/dL (ref 6–23)
Calcium: 9.3 mg/dL (ref 8.4–10.5)
Creatinine, Ser: 1.12 mg/dL — ABNORMAL HIGH (ref 0.50–1.10)
GFR calc Af Amer: 53 mL/min — ABNORMAL LOW (ref >90)
Glucose, Bld: 79 mg/dL (ref 70–99)
Potassium: 3.4 mEq/L — ABNORMAL LOW (ref 3.5–5.1)
Total Protein: 7 g/dL (ref 6.0–8.3)

## 2013-02-10 MED ORDER — SODIUM CHLORIDE 0.9 % IV BOLUS (SEPSIS)
1000.0000 mL | Freq: Once | INTRAVENOUS | Status: AC
  Administered 2013-02-10: 1000 mL via INTRAVENOUS

## 2013-02-10 MED ORDER — ONDANSETRON HCL 4 MG/2ML IJ SOLN
INTRAMUSCULAR | Status: AC
  Administered 2013-02-10: 4 mg via INTRAVENOUS
  Filled 2013-02-10: qty 2

## 2013-02-10 MED ORDER — ONDANSETRON HCL 4 MG/2ML IJ SOLN: 4.0000 mg | Freq: Once | INTRAMUSCULAR | Status: AC

## 2013-02-10 MED ORDER — ONDANSETRON 4 MG PO TBDP: ORAL_TABLET | ORAL | Status: DC

## 2013-02-10 NOTE — ED Provider Notes (Addendum)
History    This chart was scribed for Kristin Lennert, MD by Leone Payor, ED Scribe. This patient was seen in room APA03/APA03 and the patient's care was started 5:04 PM.   CSN: [id number]  Arrival date & time 02/10/13  1634   First MD Initiated Contact with Patient 02/10/13 1702      Chief Complaint  Patient presents with  . Emesis     Patient is a 79 y.o. female presenting with vomiting. The history is provided by the patient and a relative. No language interpreter was used.  Emesis Severity:  Moderate Duration:  1 month Timing:  Intermittent Quality:  Stomach contents Able to tolerate:  Liquids Progression:  Worsening Chronicity:  Recurrent Context: not post-tussive and not self-induced   Associated symptoms: abdominal pain   Associated symptoms: no diarrhea and no headaches     HPI Comments: Kristin Logan is a 79 y.o. female who presents to the Emergency Department complaining of ongoing, worsened vomiting starting 1 month ago. Pt was admitted for 1 week for similar symptoms and a sodium imbalance. Pt states she has a loss of appetite. For the last few days, pt has only had dry heaves.    + -   Past Medical History  Diagnosis Date  . Diabetes mellitus   . Hyperlipidemia   . Eosinophilia   . Cataract   . Hypertension   . Atrial fibrillation prior to 2008    moderate biatrial enlargement in 2009; mild to moderate LVH with a low-normal EF  . GERD (gastroesophageal reflux disease)     Hemorrhoids; history of peptic ulcer disease  . DJD (degenerative joint disease) of knee   . Sleep apnea     CPAP  . Chronic anticoagulation   . Aortic valve disease     2009: mild stenosis and regurgitation; peak gradient of 30-35 mmHg , subsequent AVR at [facility]  . Incontinence     Mild  . Carcinoma of breast     Right mastectomy; radiation and hormonal therapy  . Hip fracture, left 12/12/2012  . Foot injury 06/25/2012  . Degenerative joint disease of knee 01/29/2008     Bilateral    . BREAST CANCER, HX OF 01/29/2008    Lumpectomy, radiation therapy  Arimidex stopped in July 2013    . Syncope and collapse 11/11/2012    Past Surgical History  Procedure Laterality Date  . Mastectomy partial / lumpectomy w/ axillary lymphadenectomy  2008    Right for carcinoma;  . Exploration post operative open heart    . Colonoscopy      Approximately 2000  . Cardiac valve replacement      [facility]    Family History  Problem Relation Age of Onset  . Heart disease Mother   . Stroke Father   . Cancer Sister   . Heart disease Sister   . Stroke Sister   . Stroke Brother     History  Substance Use Topics  . Smoking status: Never Smoker   . Smokeless tobacco: Not on file  . Alcohol Use: No    OB History   Grav Para Term Preterm Abortions TAB SAB Ect Mult Living                  Review of Systems  Constitutional: Positive for appetite change. Negative for fatigue.  HENT: Negative for congestion, sinus pressure and ear discharge.   Eyes: Negative for discharge.  Respiratory: Negative for cough.   Cardiovascular: Negative  for chest pain.  Gastrointestinal: Positive for vomiting and abdominal pain. Negative for diarrhea.  Genitourinary: Negative for frequency and hematuria.  Musculoskeletal: Negative for back pain.  Skin: Negative for rash.  Neurological: Positive for weakness. Negative for seizures and headaches.  Psychiatric/Behavioral: Negative for hallucinations.    Allergies  Nsaids; Codeine; and Adhesive  Home Medications   Current Outpatient Rx  Name  Route  Sig  Dispense  Refill  . acetaminophen (TYLENOL) 650 MG CR tablet   Oral   Take 1 tablet (650 mg total) by mouth every 8 (eight) hours as needed for pain.         Marland Kitchen albuterol (PROVENTIL HFA;VENTOLIN HFA) 108 (90 BASE) MCG/ACT inhaler   Inhalation   Inhale 2 puffs into the lungs every 4 (four) hours as needed for wheezing.   1 Inhaler   2   . albuterol (PROVENTIL) (2.5 MG/3ML) 0.083%  nebulizer solution   Nebulization   Take 3 mLs (2.5 mg total) by nebulization every 6 (six) hours as needed for wheezing.   75 mL   3   . amLODipine (NORVASC) 5 MG tablet   Oral   Take 5 mg by mouth daily.         . bisacodyl (DULCOLAX) 10 MG suppository   Rectal   Place 1 suppository (10 mg total) rectally daily as needed for constipation.   12 suppository   2   . buPROPion (WELLBUTRIN) 75 MG tablet   Oral   Take 0.5 tablets (37.5 mg total) by mouth 2 (two) times daily.   60 tablet   1   . calcium carbonate (OS-CAL) 600 MG TABS   Oral   Take 600 mg by mouth daily.         . clotrimazole-betamethasone (LOTRISONE) cream   Topical   Apply 1 application topically 2 (two) times daily.         Marland Kitchen demeclocycline (DECLOMYCIN) 150 MG tablet   Oral   Take 2 tablets (300 mg total) by mouth every 8 (eight) hours.   180 tablet   0   . diclofenac sodium (VOLTAREN) 1 % GEL   Topical   Apply 2 g topically daily as needed (Pain).         . furosemide (LASIX) 40 MG tablet      TAKE 1 TABLET BY MOUTH DAILY FOR FLUID.   30 tablet   1   . glimepiride (AMARYL) 1 MG tablet   Oral   Take 1 mg by mouth daily before breakfast.         . HYDROcodone-acetaminophen (NORCO) 5-325 MG per tablet   Oral   Take 1 tablet by mouth every 6 (six) hours as needed for pain.   60 tablet   2   . lactulose (CHRONULAC) 10 GM/15ML solution      Take 30ml daily for constipation as needed   240 mL   0   . levothyroxine (SYNTHROID, LEVOTHROID) 50 MCG tablet      TAKE 1 TABLET BY MOUTH ONCE A DAY FOR THYROID.TAKE 30 MINUTES BEFORE OTHER MORNING MEDICATIONS.   30 tablet   1   . lisinopril (PRINIVIL,ZESTRIL) 40 MG tablet   Oral   Take 40 mg by mouth daily.         . metoprolol succinate (TOPROL-XL) 25 MG 24 hr tablet      TAKE ONE TABLET BY MOUTH DAILY FOR BLOOD PRESSURE.   30 tablet   1   . Multiple  Vitamin (MULTIVITAMIN) tablet   Oral   Take 1 tablet by mouth daily.          Marland Kitchen omeprazole (PRILOSEC) 20 MG capsule      TAKE (1) CAPSULE BY MOUTH TWICE DAILY FOR HEARTBURN.   60 capsule   1   . polyethylene glycol (MIRALAX / GLYCOLAX) packet   Oral   Take 17 g by mouth 2 (two) times daily.         . potassium chloride SA (K-DUR,KLOR-CON) 20 MEQ tablet      TAKE (1) TABLET BY MOUTH DAILY. FOR POTASSIUM AND FLUID.   30 tablet   1   . promethazine (PHENERGAN) 12.5 MG suppository   Rectal   Place 1 suppository (12.5 mg total) rectally every 6 (six) hours as needed for nausea.   12 each   2   . promethazine (PHENERGAN) 12.5 MG tablet      TAKE ONE TABLET BY MOUTH ONCE DAILY AS NEEDED FOR SEVERE NAUSEA.   30 tablet   0   . simvastatin (ZOCOR) 20 MG tablet   Oral   Take 1 tablet (20 mg total) by mouth at bedtime.   30 tablet   6   . warfarin (COUMADIN) 5 MG tablet   Oral   Take 5-10 mg by mouth daily. Takes 5 mg every day except on Tues when she takes 10 mg.           BP 124/68  Pulse 70  Temp(Src) 96.9 F (36.1 C) (Oral)  Resp 16  Ht 5\' 5"  (1.651 m)  Wt 195 lb (88.451 kg)  BMI 32.45 kg/m2  SpO2 98%  Physical Exam  Nursing note and vitals reviewed. Constitutional: She is oriented to person, place, and time. She appears well-developed.  HENT:  Head: Normocephalic.  Mouth/Throat: Mucous membranes are dry.  Eyes: Conjunctivae and EOM are normal. No scleral icterus.  Neck: Neck supple. No thyromegaly present.  Cardiovascular: Normal rate, regular rhythm and normal heart sounds.  Exam reveals no gallop and no friction rub.   No murmur heard. Pulmonary/Chest: Effort normal and breath sounds normal. No stridor. She has no wheezes. She has no rales. She exhibits no tenderness.  Abdominal: Soft. She exhibits no distension. There is tenderness. There is no rebound.  Moderate tenderness in epigastric region.   Musculoskeletal: Normal range of motion. She exhibits no edema.  Lymphadenopathy:    She has no cervical adenopathy.   Neurological: She is oriented to person, place, and time. Coordination normal.  Skin: No rash noted. No erythema.  Psychiatric: She has a normal mood and affect. Her behavior is normal.    ED Course  Procedures (including critical care time) DIAGNOSTIC STUDIES: Oxygen Saturation is 98% on room air, normal by my interpretation.    COORDINATION OF CARE: 5:16 PM Discussed treatment plan with pt at bedside and pt agreed to plan.   Results for orders placed during the hospital encounter of 02/10/13  CBC WITH DIFFERENTIAL      Result Value Range   WBC 8.5  4.0 - 10.5 K/uL   RBC 4.57  3.87 - 5.11 MIL/uL   Hemoglobin 13.2  12.0 - 15.0 g/dL   HCT 45.4  09.8 - 11.9 %   MCV 83.2  78.0 - 100.0 fL   MCH 28.9  26.0 - 34.0 pg   MCHC 34.7  30.0 - 36.0 g/dL   RDW 14.7  82.9 - 56.2 %   Platelets 280  150 - 400  K/uL   Neutrophils Relative 54  43 - 77 %   Neutro Abs 4.6  1.7 - 7.7 K/uL   Lymphocytes Relative 26  12 - 46 %   Lymphs Abs 2.2  0.7 - 4.0 K/uL   Monocytes Relative 10  3 - 12 %   Monocytes Absolute 0.8  0.1 - 1.0 K/uL   Eosinophils Relative 10 (*) 0 - 5 %   Eosinophils Absolute 0.9 (*) 0.0 - 0.7 K/uL   Basophils Relative 1  0 - 1 %   Basophils Absolute 0.1  0.0 - 0.1 K/uL  COMPREHENSIVE METABOLIC PANEL      Result Value Range   Sodium 132 (*) 135 - 145 mEq/L   Potassium 3.4 (*) 3.5 - 5.1 mEq/L   Chloride 93 (*) 96 - 112 mEq/L   CO2 22  19 - 32 mEq/L   Glucose, Bld 79  70 - 99 mg/dL   BUN 15  6 - 23 mg/dL   Creatinine, Ser 1.61 (*) 0.50 - 1.10 mg/dL   Calcium 9.3  8.4 - 09.6 mg/dL   Total Protein 7.0  6.0 - 8.3 g/dL   Albumin 3.6  3.5 - 5.2 g/dL   AST 28  0 - 37 U/L   ALT 9  0 - 35 U/L   Alkaline Phosphatase 103  39 - 117 U/L   Total Bilirubin 0.3  0.3 - 1.2 mg/dL   GFR calc non Af Amer 45 (*) >90 mL/min   GFR calc Af Amer 53 (*) >90 mL/min       Labs Reviewed - No data to display Dg Abd Acute W/chest  02/10/2013  *RADIOLOGY REPORT*  Clinical Data: Vomiting.  ACUTE  ABDOMEN SERIES (ABDOMEN 2 VIEW & CHEST 1 VIEW)  Comparison: 01/16/2013 plain film exam.  01/15/2013 CT chest. 01/14/2013 CT abdomen pelvis.  Findings: Cardiomegaly.  Calcified tortuous aorta.  Post right axillary lymph node dissection.  7 mm ground-glass opacity noted within the left upper lobe on the recent CT cannot be assessed by plain film examination.  Please see prior CT report.  No infiltrate or pneumothorax.  Mild central pulmonary vascular prominence.  Nonspecific bowel gas pattern with gas filled top normal size small bowel loops and gas filled slightly featureless colonic loops. This represents an improvement since the prior plain film examination.  No free intraperitoneal air.  IMPRESSION: Nonspecific bowel gas pattern appears improved when compared to the most recent plain film examination.  Please see above.   Original Report Authenticated By: Lacy Duverney, M.D.      No diagnosis found.   MDM  Pt improved with saline and will follow up   The chart was scribed for me under my direct supervision.  I personally performed the history, physical, and medical decision making and all procedures in the evaluation of this patient.Kristin Lennert, MD 02/10/13 1849  Kristin Lennert, MD 02/10/13 [id number]

## 2013-02-10 NOTE — Telephone Encounter (Signed)
Daughter advised to take her back to the ED

## 2013-02-10 NOTE — Telephone Encounter (Signed)
PT ALSO WANTS YOU TO KNOW THAT SHE IS ON A ANTIBIOTIC / DEMECLOCYCL 150 MG

## 2013-02-10 NOTE — Telephone Encounter (Signed)
Advised to go to ED Needs IV hydration and anti-emetics May need to stop the antibiotic for her sodium levels

## 2013-02-10 NOTE — ED Notes (Signed)
Vomiting, weakness, sent by [facility] for evaluation

## 2013-02-10 NOTE — ED Notes (Signed)
Pt c/o nausea currently with dry heaves, requests medication for N/V.

## 2013-02-10 NOTE — Telephone Encounter (Signed)
Still nausea and vomiting. Can't even keep broth or crackers. Real weak and nauseated. 2-3 times per day. Not taking many fluids, tried drinking the gadorade but its so sweet its making her sick. Hard dry heaves for a long time last night with just a bit of foam coming up. Can't use the suppositories because her hemmohoids are so bad. Please advise

## 2013-02-10 NOTE — Telephone Encounter (Signed)
Please call and triage, she may need to be sent back to ER if they cant bring her in She has phenergan suppository Make sure bowels are moving

## 2013-02-10 NOTE — Telephone Encounter (Signed)
PT - 38.5 and INR - 3.2 / please call with instructions. / tgs

## 2013-02-10 NOTE — Telephone Encounter (Signed)
See coumadin note. 

## 2013-02-13 ENCOUNTER — Telehealth: Payer: Self-pay

## 2013-02-13 NOTE — Telephone Encounter (Signed)
Daughter said her mom is vomiting again. Can't keep anythig down. Didn't start until AFTER she took the pills to bring up her sodium. They are thinking those are the cause. Can she come off of them or what do you suggest?

## 2013-02-13 NOTE — Telephone Encounter (Signed)
PATIENT AWARE

## 2013-02-13 NOTE — Telephone Encounter (Signed)
Please have her go ahead and stop the democlocycline She can use either the suppository or the zofran SL tabs given in ER Schedule visit is she does not improve with stopping the medications or go back to ER

## 2013-02-14 ENCOUNTER — Ambulatory Visit: Payer: Medicare Other | Admitting: Family Medicine

## 2013-02-14 ENCOUNTER — Ambulatory Visit (INDEPENDENT_AMBULATORY_CARE_PROVIDER_SITE_OTHER): Payer: Medicare Other | Admitting: *Deleted

## 2013-02-14 DIAGNOSIS — I482 Chronic atrial fibrillation, unspecified: Secondary | ICD-10-CM

## 2013-02-14 DIAGNOSIS — Z7901 Long term (current) use of anticoagulants: Secondary | ICD-10-CM

## 2013-02-14 DIAGNOSIS — I4891 Unspecified atrial fibrillation: Secondary | ICD-10-CM

## 2013-02-14 LAB — POCT INR: INR: 4

## 2013-02-15 ENCOUNTER — Other Ambulatory Visit: Payer: Self-pay | Admitting: Family Medicine

## 2013-02-18 ENCOUNTER — Inpatient Hospital Stay (HOSPITAL_COMMUNITY)
Admission: EM | Admit: 2013-02-18 | Discharge: 2013-02-25 | DRG: 392 | Disposition: A | Discharge status: Home Health | Payer: Medicare Other | Attending: Internal Medicine | Admitting: Internal Medicine

## 2013-02-18 ENCOUNTER — Encounter (HOSPITAL_COMMUNITY): Payer: Self-pay | Admitting: *Deleted

## 2013-02-18 ENCOUNTER — Inpatient Hospital Stay (HOSPITAL_COMMUNITY): Payer: Medicare Other

## 2013-02-18 ENCOUNTER — Emergency Department (HOSPITAL_COMMUNITY): Payer: Medicare Other

## 2013-02-18 DIAGNOSIS — M1710 Unilateral primary osteoarthritis, unspecified knee: Secondary | ICD-10-CM | POA: Diagnosis present

## 2013-02-18 DIAGNOSIS — K5900 Constipation, unspecified: Secondary | ICD-10-CM

## 2013-02-18 DIAGNOSIS — R1115 Cyclical vomiting syndrome unrelated to migraine: Secondary | ICD-10-CM

## 2013-02-18 DIAGNOSIS — R05 Cough: Secondary | ICD-10-CM

## 2013-02-18 DIAGNOSIS — E871 Hypo-osmolality and hyponatremia: Secondary | ICD-10-CM

## 2013-02-18 DIAGNOSIS — R2681 Unsteadiness on feet: Secondary | ICD-10-CM

## 2013-02-18 DIAGNOSIS — R531 Weakness: Secondary | ICD-10-CM

## 2013-02-18 DIAGNOSIS — M170 Bilateral primary osteoarthritis of knee: Secondary | ICD-10-CM

## 2013-02-18 DIAGNOSIS — G4730 Sleep apnea, unspecified: Secondary | ICD-10-CM

## 2013-02-18 DIAGNOSIS — E785 Hyperlipidemia, unspecified: Secondary | ICD-10-CM

## 2013-02-18 DIAGNOSIS — Z79899 Other long term (current) drug therapy: Secondary | ICD-10-CM

## 2013-02-18 DIAGNOSIS — I739 Peripheral vascular disease, unspecified: Secondary | ICD-10-CM

## 2013-02-18 DIAGNOSIS — R634 Abnormal weight loss: Secondary | ICD-10-CM

## 2013-02-18 DIAGNOSIS — Z954 Presence of other heart-valve replacement: Secondary | ICD-10-CM

## 2013-02-18 DIAGNOSIS — I4891 Unspecified atrial fibrillation: Secondary | ICD-10-CM | POA: Diagnosis present

## 2013-02-18 DIAGNOSIS — I671 Cerebral aneurysm, nonruptured: Secondary | ICD-10-CM

## 2013-02-18 DIAGNOSIS — I359 Nonrheumatic aortic valve disorder, unspecified: Secondary | ICD-10-CM

## 2013-02-18 DIAGNOSIS — F3289 Other specified depressive episodes: Secondary | ICD-10-CM | POA: Diagnosis present

## 2013-02-18 DIAGNOSIS — R1013 Epigastric pain: Secondary | ICD-10-CM | POA: Diagnosis present

## 2013-02-18 DIAGNOSIS — Z87820 Personal history of traumatic brain injury: Secondary | ICD-10-CM

## 2013-02-18 DIAGNOSIS — Z823 Family history of stroke: Secondary | ICD-10-CM

## 2013-02-18 DIAGNOSIS — N179 Acute kidney failure, unspecified: Secondary | ICD-10-CM

## 2013-02-18 DIAGNOSIS — Z9010 Acquired absence of unspecified breast and nipple: Secondary | ICD-10-CM

## 2013-02-18 DIAGNOSIS — F32A Depression, unspecified: Secondary | ICD-10-CM

## 2013-02-18 DIAGNOSIS — K429 Umbilical hernia without obstruction or gangrene: Secondary | ICD-10-CM | POA: Diagnosis present

## 2013-02-18 DIAGNOSIS — R627 Adult failure to thrive: Secondary | ICD-10-CM | POA: Diagnosis present

## 2013-02-18 DIAGNOSIS — N952 Postmenopausal atrophic vaginitis: Secondary | ICD-10-CM

## 2013-02-18 DIAGNOSIS — E669 Obesity, unspecified: Secondary | ICD-10-CM

## 2013-02-18 DIAGNOSIS — R112 Nausea with vomiting, unspecified: Secondary | ICD-10-CM

## 2013-02-18 DIAGNOSIS — I482 Chronic atrial fibrillation, unspecified: Secondary | ICD-10-CM

## 2013-02-18 DIAGNOSIS — K219 Gastro-esophageal reflux disease without esophagitis: Secondary | ICD-10-CM

## 2013-02-18 DIAGNOSIS — I10 Essential (primary) hypertension: Secondary | ICD-10-CM

## 2013-02-18 DIAGNOSIS — M51379 Other intervertebral disc degeneration, lumbosacral region without mention of lumbar back pain or lower extremity pain: Secondary | ICD-10-CM | POA: Diagnosis present

## 2013-02-18 DIAGNOSIS — Z8249 Family history of ischemic heart disease and other diseases of the circulatory system: Secondary | ICD-10-CM

## 2013-02-18 DIAGNOSIS — R053 Chronic cough: Secondary | ICD-10-CM

## 2013-02-18 DIAGNOSIS — R300 Dysuria: Secondary | ICD-10-CM

## 2013-02-18 DIAGNOSIS — K3189 Other diseases of stomach and duodenum: Secondary | ICD-10-CM | POA: Diagnosis present

## 2013-02-18 DIAGNOSIS — F329 Major depressive disorder, single episode, unspecified: Secondary | ICD-10-CM

## 2013-02-18 DIAGNOSIS — E039 Hypothyroidism, unspecified: Secondary | ICD-10-CM

## 2013-02-18 DIAGNOSIS — Z923 Personal history of irradiation: Secondary | ICD-10-CM

## 2013-02-18 DIAGNOSIS — E860 Dehydration: Secondary | ICD-10-CM | POA: Diagnosis present

## 2013-02-18 DIAGNOSIS — Z809 Family history of malignant neoplasm, unspecified: Secondary | ICD-10-CM

## 2013-02-18 DIAGNOSIS — R1110 Vomiting, unspecified: Secondary | ICD-10-CM

## 2013-02-18 DIAGNOSIS — D721 Eosinophilia, unspecified: Secondary | ICD-10-CM

## 2013-02-18 DIAGNOSIS — K8020 Calculus of gallbladder without cholecystitis without obstruction: Secondary | ICD-10-CM | POA: Diagnosis present

## 2013-02-18 DIAGNOSIS — Z853 Personal history of malignant neoplasm of breast: Secondary | ICD-10-CM

## 2013-02-18 DIAGNOSIS — Z6832 Body mass index (BMI) 32.0-32.9, adult: Secondary | ICD-10-CM

## 2013-02-18 DIAGNOSIS — IMO0002 Reserved for concepts with insufficient information to code with codable children: Secondary | ICD-10-CM

## 2013-02-18 DIAGNOSIS — Z7901 Long term (current) use of anticoagulants: Secondary | ICD-10-CM

## 2013-02-18 DIAGNOSIS — E119 Type 2 diabetes mellitus without complications: Secondary | ICD-10-CM

## 2013-02-18 DIAGNOSIS — Z7401 Bed confinement status: Secondary | ICD-10-CM

## 2013-02-18 DIAGNOSIS — M5137 Other intervertebral disc degeneration, lumbosacral region: Secondary | ICD-10-CM | POA: Diagnosis present

## 2013-02-18 DIAGNOSIS — K3184 Gastroparesis: Principal | ICD-10-CM | POA: Diagnosis present

## 2013-02-18 HISTORY — DX: Collapsed vertebra, not elsewhere classified, site unspecified, initial encounter for fracture: M48.50XA

## 2013-02-18 HISTORY — DX: Calculus of gallbladder without cholecystitis without obstruction: K80.20

## 2013-02-18 HISTORY — DX: Other chronic pain: G89.29

## 2013-02-18 HISTORY — DX: Paresthesia of skin: R20.2

## 2013-02-18 HISTORY — DX: Diverticulosis of intestine, part unspecified, without perforation or abscess without bleeding: K57.90

## 2013-02-18 HISTORY — DX: Other intervertebral disc degeneration, lumbar region: M51.36

## 2013-02-18 HISTORY — DX: Umbilical hernia without obstruction or gangrene: K42.9

## 2013-02-18 HISTORY — DX: Other intervertebral disc degeneration, lumbar region without mention of lumbar back pain or lower extremity pain: M51.369

## 2013-02-18 LAB — CBC WITH DIFFERENTIAL/PLATELET
Hemoglobin: 14.6 g/dL (ref 12.0–15.0)
Lymphs Abs: 3.9 10*3/uL (ref 0.7–4.0)
Monocytes Relative: 8 % (ref 3–12)
Neutro Abs: 4.1 10*3/uL (ref 1.7–7.7)
Neutrophils Relative %: 41 % — ABNORMAL LOW (ref 43–77)
Platelets: 301 10*3/uL (ref 150–400)
RBC: 5.09 MIL/uL (ref 3.87–5.11)
WBC: 9.9 10*3/uL (ref 4.0–10.5)

## 2013-02-18 LAB — GLUCOSE, CAPILLARY: Glucose-Capillary: 74 mg/dL (ref 70–99)

## 2013-02-18 LAB — COMPREHENSIVE METABOLIC PANEL
ALT: 12 U/L (ref 0–35)
Albumin: 3.9 g/dL (ref 3.5–5.2)
Alkaline Phosphatase: 126 U/L — ABNORMAL HIGH (ref 39–117)
BUN: 21 mg/dL (ref 6–23)
Chloride: 92 mEq/L — ABNORMAL LOW (ref 96–112)
GFR calc Af Amer: 49 mL/min — ABNORMAL LOW (ref >90)
Glucose, Bld: 90 mg/dL (ref 70–99)
Potassium: 3.8 mEq/L (ref 3.5–5.1)
Sodium: 132 mEq/L — ABNORMAL LOW (ref 135–145)
Total Bilirubin: 0.4 mg/dL (ref 0.3–1.2)

## 2013-02-18 LAB — URINALYSIS, ROUTINE W REFLEX MICROSCOPIC
Ketones, ur: NEGATIVE mg/dL
Leukocytes, UA: NEGATIVE
Nitrite: NEGATIVE
Protein, ur: NEGATIVE mg/dL
Urobilinogen, UA: 0.2 mg/dL (ref 0.0–1.0)
pH: 6 (ref 5.0–8.0)

## 2013-02-18 LAB — PROTIME-INR
INR: 3.84 — ABNORMAL HIGH (ref 0.00–1.49)
Prothrombin Time: 35.5 seconds — ABNORMAL HIGH (ref 11.6–15.2)

## 2013-02-18 LAB — LIPASE, BLOOD: Lipase: 25 U/L (ref 11–59)

## 2013-02-18 MED ORDER — ONDANSETRON HCL 4 MG PO TABS
4.0000 mg | ORAL_TABLET | Freq: Four times a day (QID) | ORAL | Status: DC | PRN
  Filled 2013-02-18: qty 1

## 2013-02-18 MED ORDER — ACETAMINOPHEN 325 MG PO TABS
650.0000 mg | ORAL_TABLET | Freq: Four times a day (QID) | ORAL | Status: DC | PRN
  Administered 2013-02-19 – 2013-02-21 (×3): 650 mg via ORAL
  Filled 2013-02-18 (×3): qty 2

## 2013-02-18 MED ORDER — AMLODIPINE BESYLATE 5 MG PO TABS
5.0000 mg | ORAL_TABLET | Freq: Every day | ORAL | Status: DC
  Administered 2013-02-19 – 2013-02-25 (×7): 5 mg via ORAL
  Filled 2013-02-18 (×7): qty 1

## 2013-02-18 MED ORDER — ONDANSETRON HCL 4 MG/2ML IJ SOLN
4.0000 mg | Freq: Four times a day (QID) | INTRAMUSCULAR | Status: DC | PRN
  Administered 2013-02-18 – 2013-02-24 (×3): 4 mg via INTRAVENOUS
  Filled 2013-02-18 (×3): qty 2

## 2013-02-18 MED ORDER — PANTOPRAZOLE SODIUM 40 MG IV SOLR
40.0000 mg | INTRAVENOUS | Status: DC
  Administered 2013-02-18 – 2013-02-24 (×7): 40 mg via INTRAVENOUS
  Filled 2013-02-18 (×7): qty 40

## 2013-02-18 MED ORDER — SIMVASTATIN 20 MG PO TABS
20.0000 mg | ORAL_TABLET | Freq: Every day | ORAL | Status: DC
  Administered 2013-02-18 – 2013-02-24 (×7): 20 mg via ORAL
  Filled 2013-02-18 (×7): qty 1

## 2013-02-18 MED ORDER — INSULIN ASPART 100 UNIT/ML ~~LOC~~ SOLN: 0.0000 [IU] | Freq: Every day | SUBCUTANEOUS | Status: DC

## 2013-02-18 MED ORDER — WARFARIN - PHARMACIST DOSING INPATIENT
Status: DC
  Administered 2013-02-21 – 2013-02-23 (×3)

## 2013-02-18 MED ORDER — LEVOTHYROXINE SODIUM 50 MCG PO TABS
50.0000 ug | ORAL_TABLET | Freq: Every day | ORAL | Status: DC
  Administered 2013-02-20 – 2013-02-25 (×6): 50 ug via ORAL
  Filled 2013-02-18 (×6): qty 1

## 2013-02-18 MED ORDER — SODIUM CHLORIDE 0.9 % IV SOLN: INTRAVENOUS | Status: DC

## 2013-02-18 MED ORDER — ALBUTEROL SULFATE (5 MG/ML) 0.5% IN NEBU
2.5000 mg | INHALATION_SOLUTION | Freq: Four times a day (QID) | RESPIRATORY_TRACT | Status: DC | PRN
  Administered 2013-02-22 – 2013-02-25 (×3): 2.5 mg via RESPIRATORY_TRACT
  Filled 2013-02-18 (×3): qty 0.5

## 2013-02-18 MED ORDER — BUPROPION HCL 75 MG PO TABS
37.5000 mg | ORAL_TABLET | Freq: Two times a day (BID) | ORAL | Status: DC
  Administered 2013-02-18 – 2013-02-25 (×14): 37.5 mg via ORAL
  Filled 2013-02-18 (×17): qty 0.5

## 2013-02-18 MED ORDER — BIOTENE DRY MOUTH MT LIQD: 15.0000 mL | OROMUCOSAL | Status: DC | PRN

## 2013-02-18 MED ORDER — BUPROPION HCL 75 MG PO TABS
ORAL_TABLET | ORAL | Status: AC
  Filled 2013-02-18: qty 1

## 2013-02-18 MED ORDER — CHLORHEXIDINE GLUCONATE 0.12 % MT SOLN
15.0000 mL | Freq: Two times a day (BID) | OROMUCOSAL | Status: DC
  Administered 2013-02-18 – 2013-02-25 (×13): 15 mL via OROMUCOSAL
  Filled 2013-02-18 (×13): qty 15

## 2013-02-18 MED ORDER — ONDANSETRON HCL 4 MG/2ML IJ SOLN
4.0000 mg | INTRAMUSCULAR | Status: DC | PRN
  Administered 2013-02-18: 4 mg via INTRAVENOUS
  Filled 2013-02-18: qty 2

## 2013-02-18 MED ORDER — METOPROLOL SUCCINATE ER 25 MG PO TB24
25.0000 mg | ORAL_TABLET | Freq: Every morning | ORAL | Status: DC
  Administered 2013-02-19 – 2013-02-25 (×7): 25 mg via ORAL
  Filled 2013-02-18 (×7): qty 1

## 2013-02-18 MED ORDER — FAMOTIDINE IN NACL 20-0.9 MG/50ML-% IV SOLN
20.0000 mg | Freq: Once | INTRAVENOUS | Status: AC
  Administered 2013-02-18: 20 mg via INTRAVENOUS
  Filled 2013-02-18: qty 50

## 2013-02-18 MED ORDER — SODIUM CHLORIDE 0.9 % IV SOLN
INTRAVENOUS | Status: DC
  Administered 2013-02-18: 16:00:00 via INTRAVENOUS

## 2013-02-18 MED ORDER — DEXTROSE-NACL 5-0.9 % IV SOLN
INTRAVENOUS | Status: DC
  Administered 2013-02-18 – 2013-02-20 (×4): via INTRAVENOUS

## 2013-02-18 MED ORDER — ACETAMINOPHEN 650 MG RE SUPP: 650.0000 mg | Freq: Four times a day (QID) | RECTAL | Status: DC | PRN

## 2013-02-18 MED ORDER — INSULIN ASPART 100 UNIT/ML ~~LOC~~ SOLN
0.0000 [IU] | Freq: Three times a day (TID) | SUBCUTANEOUS | Status: DC
  Administered 2013-02-19: 2 [IU] via SUBCUTANEOUS

## 2013-02-18 NOTE — Progress Notes (Signed)
ANTICOAGULATION CONSULT NOTE - Initial Consult  Pharmacy Consult for Warfarin Indication: atrial fibrillation  Allergies  Allergen Reactions  . Nsaids     Bleeding ulcer  . Codeine Nausea And Vomiting  . Adhesive (Tape) Rash    Redness, and peeling skin off.     Patient Measurements: Height: 5\' 5"  (165.1 cm) Weight: 190 lb (86.183 kg) IBW/kg (Calculated) : 57   Vital Signs: Temp: 97.8 F (36.6 C) (05/20 1822) Temp src: Oral (05/20 1822) BP: 120/64 mmHg (05/20 1822) Pulse Rate: 62 (05/20 1822)  Labs:  Recent Labs  02/18/13 1535  HGB 14.6  HCT 42.4  PLT 301  LABPROT 35.5*  INR 3.84*  CREATININE 1.18*  TROPONINI <0.30    Estimated Creatinine Clearance: 41.9 ml/min (by C-G formula based on Cr of 1.18).   Medical History: Past Medical History  Diagnosis Date  . Diabetes mellitus   . Hyperlipidemia   . Eosinophilia   . Cataract   . Hypertension   . Atrial fibrillation prior to 2008    moderate biatrial enlargement in 2009; mild to moderate LVH with a low-normal EF  . GERD (gastroesophageal reflux disease)     Hemorrhoids; history of peptic ulcer disease  . DJD (degenerative joint disease) of knee   . Sleep apnea     CPAP  . Chronic anticoagulation   . Aortic valve disease     2009: mild stenosis and regurgitation; peak gradient of 30-35 mmHg , subsequent AVR at [facility]  . Incontinence     Mild  . Carcinoma of breast     Right mastectomy; radiation and hormonal therapy  . Hip fracture, left 12/12/2012  . Foot injury 06/25/2012  . Degenerative joint disease of knee 01/29/2008    Bilateral    . BREAST CANCER, HX OF 01/29/2008    Lumpectomy, radiation therapy  Arimidex stopped in July 2013    . Syncope and collapse 11/11/2012  . Diverticulosis   . Umbilical hernia   . Cholelithiasis   . DDD (degenerative disc disease), lumbar   . Vertebral compression fracture     lumbar  . Chronic pain   . Paresthesias     feet    Medications:  Prescriptions prior  to admission  Medication Sig Dispense Refill  . albuterol (PROVENTIL HFA;VENTOLIN HFA) 108 (90 BASE) MCG/ACT inhaler Inhale 2 puffs into the lungs every 4 (four) hours as needed for wheezing.  1 Inhaler  2  . albuterol (PROVENTIL) (2.5 MG/3ML) 0.083% nebulizer solution Take 3 mLs (2.5 mg total) by nebulization every 6 (six) hours as needed for wheezing.  75 mL  3  . amLODipine (NORVASC) 5 MG tablet Take 5 mg by mouth daily.      . bisacodyl (DULCOLAX) 10 MG suppository Place 1 suppository (10 mg total) rectally daily as needed for constipation.  12 suppository  2  . bisacodyl (DULCOLAX) 5 MG EC tablet Take 5 mg by mouth daily as needed for constipation.      Marland Kitchen buPROPion (WELLBUTRIN) 75 MG tablet Take 0.5 tablets (37.5 mg total) by mouth 2 (two) times daily.  60 tablet  1  . clotrimazole-betamethasone (LOTRISONE) cream Apply 1 application topically 2 (two) times daily.      Marland Kitchen glimepiride (AMARYL) 1 MG tablet Take 1 mg by mouth daily before breakfast.      . HYDROcodone-acetaminophen (NORCO) 5-325 MG per tablet Take 1 tablet by mouth every 6 (six) hours as needed for pain.  60 tablet  2  .  levothyroxine (SYNTHROID, LEVOTHROID) 50 MCG tablet Take 50 mcg by mouth daily. To take 30 minutes prior to all other medications      . metoprolol succinate (TOPROL-XL) 25 MG 24 hr tablet Take 25 mg by mouth every morning.       Marland Kitchen omeprazole (PRILOSEC) 20 MG capsule Take 20 mg by mouth daily.      . ondansetron (ZOFRAN-ODT) 4 MG disintegrating tablet Take 4 mg by mouth every 6 (six) hours as needed for nausea.      . simvastatin (ZOCOR) 20 MG tablet Take 1 tablet (20 mg total) by mouth at bedtime.  30 tablet  6  . warfarin (COUMADIN) 5 MG tablet Take 5 mg by mouth daily.       Marland Kitchen acetaminophen (TYLENOL) 650 MG CR tablet Take 1 tablet (650 mg total) by mouth every 8 (eight) hours as needed for pain.      . calcium carbonate (OS-CAL) 600 MG TABS Take 600 mg by mouth daily.      . diclofenac sodium (VOLTAREN) 1 % GEL  Apply 2 g topically daily as needed (Pain).      Marland Kitchen lactulose (CHRONULAC) 10 GM/15ML solution Take 30 g by mouth daily as needed. Take 30ml daily for constipation as needed      . Multiple Vitamin (MULTIVITAMIN) tablet Take 1 tablet by mouth daily.      . polyethylene glycol (MIRALAX / GLYCOLAX) packet Take 17 g by mouth 2 (two) times daily.        Assessment: Okay for Protocol Elevated INR, no dose this evening due to lab value and pending GI w/u.  Goal of Therapy:  INR 2-3   Plan:  No Warfarin tonight. Daily PT/INR. F/U anticoag plan.  Kristin Logan 02/18/2013,7:51 PM

## 2013-02-18 NOTE — H&P (Signed)
[name] History and Physical  Kristin Logan ZOX:[id number] DOB: 1932/03/20 DOA: 02/18/2013  Referring physician: Dr. Clarene Duke PCP: [name], MD  Specialists: Nephrologist, Dr. Wolfgang Phoenix  Chief Complaint: Intractable vomiting  HPI: Kristin Logan is a 80 y.o. female who was sent to the emergency room by her nephrologist when she presented to his office with intractable vomiting. Her family reports that she has had vomiting on and off for the last few months. Symptoms usually wax and wane but have never really resolved. She may have one day where she may be able to tolerate food, but usually she ends up throwing up. Vomiting occurs with any type of food intake. Her family reports that she lost approximately 80 pounds in the past few months. She was becoming increasingly dehydrated, lightheaded and weak. She's not had any shortness of breath, cough, fever, dysuria. She has difficulty with constipation. Patient has been unable to follow with a gastroenterologist to do repeatedly getting ill and having other doctors appointments. She has been admitted to the hospital for further workup of her vomiting.  Review of Systems: Pertinent positives as per history of present illness, otherwise negative  Past Medical History  Diagnosis Date  . Diabetes mellitus   . Hyperlipidemia   . Eosinophilia   . Cataract   . Hypertension   . Atrial fibrillation prior to 2008    moderate biatrial enlargement in 2009; mild to moderate LVH with a low-normal EF  . GERD (gastroesophageal reflux disease)     Hemorrhoids; history of peptic ulcer disease  . DJD (degenerative joint disease) of knee   . Sleep apnea     CPAP  . Chronic anticoagulation   . Aortic valve disease     2009: mild stenosis and regurgitation; peak gradient of 30-35 mmHg , subsequent AVR at [facility]  . Incontinence     Mild  . Carcinoma of breast     Right mastectomy; radiation and hormonal therapy  . Hip fracture, left  12/12/2012  . Foot injury 06/25/2012  . Degenerative joint disease of knee 01/29/2008    Bilateral    . BREAST CANCER, HX OF 01/29/2008    Lumpectomy, radiation therapy  Arimidex stopped in July 2013    . Syncope and collapse 11/11/2012  . Diverticulosis   . Umbilical hernia   . Cholelithiasis   . DDD (degenerative disc disease), lumbar   . Vertebral compression fracture     lumbar  . Chronic pain   . Paresthesias     feet   Past Surgical History  Procedure Laterality Date  . Exploration post operative open heart    . Colonoscopy      Approximately 2000  . Cardiac valve replacement      [facility]  . Cardiac valve replacement    . Breast surgery    . Breast lumpectomy     Social History:  reports that she has never smoked. She does not have any smokeless tobacco history on file. She reports that she does not drink alcohol or use illicit drugs.   Allergies  Allergen Reactions  . Nsaids     Bleeding ulcer  . Codeine Nausea And Vomiting  . Adhesive (Tape) Rash    Redness, and peeling skin off.     Family History  Problem Relation Age of Onset  . Heart disease Mother   . Stroke Father   . Cancer Sister   . Heart disease Sister   . Stroke Sister   . Stroke  Brother     Prior to Admission medications   Medication Sig Start Date End Date Taking? Authorizing Provider  albuterol (PROVENTIL HFA;VENTOLIN HFA) 108 (90 BASE) MCG/ACT inhaler Inhale 2 puffs into the lungs every 4 (four) hours as needed for wheezing. 10/15/12 10/15/13 Yes Salley Scarlet, MD  albuterol (PROVENTIL) (2.5 MG/3ML) 0.083% nebulizer solution Take 3 mLs (2.5 mg total) by nebulization every 6 (six) hours as needed for wheezing. 11/08/12  Yes Salley Scarlet, MD  amLODipine (NORVASC) 5 MG tablet Take 5 mg by mouth daily.   Yes Historical Provider, MD  bisacodyl (DULCOLAX) 10 MG suppository Place 1 suppository (10 mg total) rectally daily as needed for constipation. 01/27/13  Yes Salley Scarlet, MD  bisacodyl  (DULCOLAX) 5 MG EC tablet Take 5 mg by mouth daily as needed for constipation.   Yes Historical Provider, MD  buPROPion (WELLBUTRIN) 75 MG tablet Take 0.5 tablets (37.5 mg total) by mouth 2 (two) times daily. 02/03/13 02/03/14 Yes Salley Scarlet, MD  clotrimazole-betamethasone (LOTRISONE) cream Apply 1 application topically 2 (two) times daily.   Yes Historical Provider, MD  glimepiride (AMARYL) 1 MG tablet Take 1 mg by mouth daily before breakfast.   Yes Historical Provider, MD  HYDROcodone-acetaminophen (NORCO) 5-325 MG per tablet Take 1 tablet by mouth every 6 (six) hours as needed for pain. 12/10/12 12/10/13 Yes Salley Scarlet, MD  levothyroxine (SYNTHROID, LEVOTHROID) 50 MCG tablet Take 50 mcg by mouth daily. To take 30 minutes prior to all other medications   Yes Historical Provider, MD  metoprolol succinate (TOPROL-XL) 25 MG 24 hr tablet Take 25 mg by mouth every morning.    Yes Historical Provider, MD  omeprazole (PRILOSEC) 20 MG capsule Take 20 mg by mouth daily.   Yes Historical Provider, MD  ondansetron (ZOFRAN-ODT) 4 MG disintegrating tablet Take 4 mg by mouth every 6 (six) hours as needed for nausea. 02/10/13  Yes [name], MD  simvastatin (ZOCOR) 20 MG tablet Take 1 tablet (20 mg total) by mouth at bedtime. 01/28/13  Yes Salley Scarlet, MD  warfarin (COUMADIN) 5 MG tablet Take 5 mg by mouth daily.    Yes Historical Provider, MD  acetaminophen (TYLENOL) 650 MG CR tablet Take 1 tablet (650 mg total) by mouth every 8 (eight) hours as needed for pain. 01/21/13   Standley Brooking, MD  calcium carbonate (OS-CAL) 600 MG TABS Take 600 mg by mouth daily.    Historical Provider, MD  diclofenac sodium (VOLTAREN) 1 % GEL Apply 2 g topically daily as needed (Pain).    Historical Provider, MD  lactulose (CHRONULAC) 10 GM/15ML solution Take 30 g by mouth daily as needed. Take 30ml daily for constipation as needed 02/06/13   Salley Scarlet, MD  Multiple Vitamin (MULTIVITAMIN) tablet Take 1 tablet  by mouth daily.    Historical Provider, MD  polyethylene glycol (MIRALAX / GLYCOLAX) packet Take 17 g by mouth 2 (two) times daily.    Historical Provider, MD   Physical Exam: Filed Vitals:   02/18/13 1617 02/18/13 1620 02/18/13 1648 02/18/13 1822  BP: 112/67 126/63 126/63 120/64  Pulse: 76 83 73 62  Temp:    97.8 F (36.6 C)  TempSrc:    Oral  Resp:  16 16 18   Height:    5\' 5"  (1.651 m)  Weight:    86.183 kg (190 lb)  SpO2:  97% 97% 96%     General:  No acute distress  Eyes: Pupils  are equal round reactive to light  ENT: Mucous membranes are dry  Neck: Supple  Cardiovascular: S1, S2, irregular  Respiratory: Clear to auscultation bilaterally  Abdomen: Soft, nontender, nondistended, bowel sounds are present  Skin: No rashes  Musculoskeletal: No significant pedal edema bilaterally  Psychiatric: Normal affect, cooperative with exam  Neurologic: Grossly intact, nonfocal  Labs on Admission:  Basic Metabolic Panel:  Recent Labs Lab 02/18/13 1535  NA 132*  K 3.8  CL 92*  CO2 22  GLUCOSE 90  BUN 21  CREATININE 1.18*  CALCIUM 9.5   Liver Function Tests:  Recent Labs Lab 02/18/13 1535  AST 39*  ALT 12  ALKPHOS 126*  BILITOT 0.4  PROT 7.7  ALBUMIN 3.9    Recent Labs Lab 02/18/13 1535  LIPASE 25   No results found for this basename: AMMONIA,  in the last 168 hours CBC:  Recent Labs Lab 02/18/13 1535  WBC 9.9  NEUTROABS 4.1  HGB 14.6  HCT 42.4  MCV 83.3  PLT 301   Cardiac Enzymes:  Recent Labs Lab 02/18/13 1535  TROPONINI <0.30    BNP (last 3 results)  Recent Labs  11/11/12 0108 01/14/13 2215  PROBNP 354.6 386.4   CBG: No results found for this basename: GLUCAP,  in the last 168 hours  Radiological Exams on Admission: Dg Abd Acute W/chest  02/18/2013   *RADIOLOGY REPORT*  Clinical Data: Abdominal pain, nausea, vomiting, breast cancer, diabetes, hypertension, hyperlipidemia  ACUTE ABDOMEN SERIES (ABDOMEN 2 VIEW & CHEST 1  VIEW)  Comparison: 02/10/2013  Findings: Enlargement of cardiac silhouette. Calcified tortuous aorta. Pulmonary vascularity normal. Linear scarring versus chronic subsegmental atelectasis at right base. Lungs otherwise clear. No pleural effusion or pneumothorax. Bones demineralized. Nonobstructive bowel gas pattern. No bowel dilatation, bowel wall thickening, or free intraperitoneal air. Multilevel endplate spur formation thoracolumbar spine with question compression deformities at L2 and L1.  IMPRESSION: Enlargement of cardiac silhouette. Right basilar atelectasis versus scarring. No acute abdominal findings. Osseous demineralization with old appearing compression deformities at L1 and L2.   Original Report Authenticated By: Ulyses Southward, M.D.    EKG: Independently reviewed. Atrial fibrillation with controlled rate  Assessment/Plan Active Problems:   DIABETES MELLITUS, TYPE II   HYPERTENSION   Atrial fibrillation, chronic   GERD   Chronic anticoagulation   Depression   Hypothyroidism   Vomiting and Abdominal Pain   Dehydration   1. Intractable vomiting. Etiology is unclear. Infectious etiology seems unlikely since her symptoms have been occurring for the past few months. Maybe medication related. She is a diabetic and may have an underlying gastroparesis. In any case, I think she would benefit from inpatient gastroenterology evaluation. We will keep her on clear liquids for now and n.p.o. after midnight in case EGD is planned for tomorrow. We will start her on antiemetics as well as Protonix. Liver function tests and lipase were checked which were unremarkable. We will check an acute abdominal series. Her abdomen appears benign on examination 2. Dehydration. Give IV fluids 3. Diabetes. Continue on sliding scale insulin. 4. Chronic atrial fibrillation. Patient is anticoagulated with Coumadin. We'll hold Coumadin for now in anticipation for any procedures. We'll ask pharmacy to adjust  Coumadin. 5. Hyponatremia. Chronic, stable. 6. Depression. Continue Wellbutrin. Celexa was discontinued due to hyponatremia. 7. Hypertension. Continue metoprolol and Norvasc. Her ACE inhibitor has been discontinued.  Code Status: full code Family Communication: discussed with family at the bedside Disposition Plan: discharge home once improved  Time spent:  MEMON,JEHANZEB [name] Pager [phone]  If 7PM-7AM, please contact night-coverage [email or web address] Password [facility] 02/18/2013, 7:16 PM

## 2013-02-18 NOTE — ED Notes (Signed)
Attempted report, nurse unavailable

## 2013-02-18 NOTE — ED Provider Notes (Signed)
History     CSN: [id number]  Arrival date & time 02/18/13  1458   First MD Initiated Contact with Patient 02/18/13 1504      Chief Complaint  Patient presents with  . Emesis     HPI Pt was seen at 1520.   Per pt and her family, c/o gradual onset and persistence of multiple intermittent episodes of N/V for the past 3 months, worse over the past several days. Has been associated with generalized fatigue.  Pt's family states she has been vomiting, despite using the zofran as prescribed, and has been unable to take PO for the past several days. Pt's daughter states pt has also been on fluid restriction for the past several weeks for hyponatremia. Pt was eval today by her Renal MD (Dr. Kristian Covey) after "vomiting continuously since this morning."  States labs were drawn and then she was sent to the ED for further evaluation/admission for "acute renal failure," "excessive weight loss," and "needing an EGD."  Pt denies abd pain, no back/flank pain, no CP/SOB, no diarrhea, no black or blood in stools or emesis, no fevers, no rash.    Past Medical History  Diagnosis Date  . Diabetes mellitus   . Hyperlipidemia   . Eosinophilia   . Cataract   . Hypertension   . Atrial fibrillation prior to 2008    moderate biatrial enlargement in 2009; mild to moderate LVH with a low-normal EF  . GERD (gastroesophageal reflux disease)     Hemorrhoids; history of peptic ulcer disease  . DJD (degenerative joint disease) of knee   . Sleep apnea     CPAP  . Chronic anticoagulation   . Aortic valve disease     2009: mild stenosis and regurgitation; peak gradient of 30-35 mmHg , subsequent AVR at [facility]  . Incontinence     Mild  . Carcinoma of breast     Right mastectomy; radiation and hormonal therapy  . Hip fracture, left 12/12/2012  . Foot injury 06/25/2012  . Degenerative joint disease of knee 01/29/2008    Bilateral    . BREAST CANCER, HX OF 01/29/2008    Lumpectomy, radiation therapy  Arimidex stopped in  July 2013    . Syncope and collapse 11/11/2012  . Diverticulosis   . Umbilical hernia   . Cholelithiasis   . DDD (degenerative disc disease), lumbar   . Vertebral compression fracture     lumbar  . Chronic pain   . Paresthesias     feet    Past Surgical History  Procedure Laterality Date  . Exploration post operative open heart    . Colonoscopy      Approximately 2000  . Cardiac valve replacement      [facility]  . Cardiac valve replacement    . Breast surgery    . Breast lumpectomy      Family History  Problem Relation Age of Onset  . Heart disease Mother   . Stroke Father   . Cancer Sister   . Heart disease Sister   . Stroke Sister   . Stroke Brother     History  Substance Use Topics  . Smoking status: Never Smoker   . Smokeless tobacco: Not on file  . Alcohol Use: No    Review of Systems ROS: Statement: All systems negative except as marked or noted in the HPI; Constitutional: Negative for fever and chills. +generalized weakness/fatigue.; ; Eyes: Negative for eye pain, redness and discharge. ; ; ENMT: Negative for  ear pain, hoarseness, nasal congestion, sinus pressure and sore throat. ; ; Cardiovascular: Negative for chest pain, palpitations, diaphoresis, dyspnea and peripheral edema. ; ; Respiratory: Negative for cough, wheezing and stridor. ; ; Gastrointestinal: +N/V. Negative for diarrhea, abdominal pain, blood in stool, hematemesis, jaundice and rectal bleeding. . ; ; Genitourinary: Negative for dysuria, flank pain and hematuria. ; ; Musculoskeletal: Negative for back pain and neck pain. Negative for swelling and trauma.; ; Skin: Negative for pruritus, rash, abrasions, blisters, bruising and skin lesion.; ; Neuro: Negative for headache, lightheadedness and neck stiffness. Negative for weakness, altered level of consciousness , altered mental status, extremity weakness, paresthesias, involuntary movement, seizure and syncope.       Allergies  Nsaids; Codeine; and  Adhesive  Home Medications   Current Outpatient Rx  Name  Route  Sig  Dispense  Refill  . albuterol (PROVENTIL HFA;VENTOLIN HFA) 108 (90 BASE) MCG/ACT inhaler   Inhalation   Inhale 2 puffs into the lungs every 4 (four) hours as needed for wheezing.   1 Inhaler   2   . albuterol (PROVENTIL) (2.5 MG/3ML) 0.083% nebulizer solution   Nebulization   Take 3 mLs (2.5 mg total) by nebulization every 6 (six) hours as needed for wheezing.   75 mL   3   . amLODipine (NORVASC) 5 MG tablet   Oral   Take 5 mg by mouth daily.         . bisacodyl (DULCOLAX) 10 MG suppository   Rectal   Place 1 suppository (10 mg total) rectally daily as needed for constipation.   12 suppository   2   . bisacodyl (DULCOLAX) 5 MG EC tablet   Oral   Take 5 mg by mouth daily as needed for constipation.         Marland Kitchen buPROPion (WELLBUTRIN) 75 MG tablet   Oral   Take 0.5 tablets (37.5 mg total) by mouth 2 (two) times daily.   60 tablet   1   . clotrimazole-betamethasone (LOTRISONE) cream   Topical   Apply 1 application topically 2 (two) times daily.         Marland Kitchen glimepiride (AMARYL) 1 MG tablet   Oral   Take 1 mg by mouth daily before breakfast.         . HYDROcodone-acetaminophen (NORCO) 5-325 MG per tablet   Oral   Take 1 tablet by mouth every 6 (six) hours as needed for pain.   60 tablet   2   . levothyroxine (SYNTHROID, LEVOTHROID) 50 MCG tablet   Oral   Take 50 mcg by mouth daily. To take 30 minutes prior to all other medications         . metoprolol succinate (TOPROL-XL) 25 MG 24 hr tablet   Oral   Take 25 mg by mouth every morning.          Marland Kitchen omeprazole (PRILOSEC) 20 MG capsule   Oral   Take 20 mg by mouth daily.         . ondansetron (ZOFRAN-ODT) 4 MG disintegrating tablet   Oral   Take 4 mg by mouth every 6 (six) hours as needed for nausea.         . simvastatin (ZOCOR) 20 MG tablet   Oral   Take 1 tablet (20 mg total) by mouth at bedtime.   30 tablet   6   .  warfarin (COUMADIN) 5 MG tablet   Oral   Take 5 mg by mouth daily.          Marland Kitchen  acetaminophen (TYLENOL) 650 MG CR tablet   Oral   Take 1 tablet (650 mg total) by mouth every 8 (eight) hours as needed for pain.         . calcium carbonate (OS-CAL) 600 MG TABS   Oral   Take 600 mg by mouth daily.         . diclofenac sodium (VOLTAREN) 1 % GEL   Topical   Apply 2 g topically daily as needed (Pain).         Marland Kitchen lactulose (CHRONULAC) 10 GM/15ML solution   Oral   Take 30 g by mouth daily as needed. Take 30ml daily for constipation as needed         . Multiple Vitamin (MULTIVITAMIN) tablet   Oral   Take 1 tablet by mouth daily.         . polyethylene glycol (MIRALAX / GLYCOLAX) packet   Oral   Take 17 g by mouth 2 (two) times daily.           BP 129/80  Pulse 56  Temp(Src) 97.7 F (36.5 C) (Oral)  Resp 21  Ht 5\' 5"  (1.651 m)  Wt 190 lb (86.183 kg)  BMI 31.62 kg/m2  SpO2 96%  Physical Exam 1525: Physical examination:  Nursing notes reviewed; Vital signs and O2 SAT reviewed;  Constitutional: Well developed, Well nourished, In no acute distress; Head:  Normocephalic, atraumatic; Eyes: EOMI, PERRL, No scleral icterus; ENMT: Mouth and pharynx normal, Mucous membranes dry;; Neck: Supple, Full range of motion, No lymphadenopathy; Cardiovascular: Irregular irregular rate and rhythm, No gallop; Respiratory: Breath sounds clear & equal bilaterally, No rales, rhonchi, wheezes.  Speaking full sentences with ease, Normal respiratory effort/excursion; Chest: Nontender, Movement normal; Abdomen: Soft, +mid-epigastric tenderness to palp. No rebound or guarding. Nondistended, Normal bowel sounds; Genitourinary: No CVA tenderness; Extremities: Pulses normal, No tenderness, No rash on feet. No edema, No calf edema or asymmetry.; Neuro: AA&Ox3, Major CN grossly intact.  Speech clear. No gross focal motor or sensory deficits in extremities.; Skin: Color normal, Warm, Dry.   ED Course   Procedures    MDM  MDM Reviewed: previous chart, nursing note and vitals Reviewed previous: labs and ECG Interpretation: labs, ECG and x-ray      Date: 02/18/2013  Rate: 69  Rhythm: atrial fibrillation and premature ventricular contractions (PVC)  QRS Axis: normal  Intervals: normal  ST/T Wave abnormalities: nonspecific ST/T changes lateral leads  Conduction Disutrbances:none  Narrative Interpretation:   Old EKG Reviewed: unchanged; no significant changes from previous EKG dated 01/15/2013.  Results for orders placed during the hospital encounter of 02/18/13  URINALYSIS, ROUTINE W REFLEX MICROSCOPIC      Result Value Range   Color, Urine YELLOW  YELLOW   APPearance CLEAR  CLEAR   Specific Gravity, Urine <1.005 (*) 1.005 - 1.030   pH 6.0  5.0 - 8.0   Glucose, UA NEGATIVE  NEGATIVE mg/dL   Hgb urine dipstick NEGATIVE  NEGATIVE   Bilirubin Urine NEGATIVE  NEGATIVE   Ketones, ur NEGATIVE  NEGATIVE mg/dL   Protein, ur NEGATIVE  NEGATIVE mg/dL   Urobilinogen, UA 0.2  0.0 - 1.0 mg/dL   Nitrite NEGATIVE  NEGATIVE   Leukocytes, UA NEGATIVE  NEGATIVE  PROTIME-INR      Result Value Range   Prothrombin Time 35.5 (*) 11.6 - 15.2 seconds   INR 3.84 (*) 0.00 - 1.49  CBC WITH DIFFERENTIAL      Result Value Range   WBC 9.9  4.0 - 10.5 K/uL   RBC 5.09  3.87 - 5.11 MIL/uL   Hemoglobin 14.6  12.0 - 15.0 g/dL   HCT 40.9  81.1 - 91.4 %   MCV 83.3  78.0 - 100.0 fL   MCH 28.7  26.0 - 34.0 pg   MCHC 34.4  30.0 - 36.0 g/dL   RDW 78.2  95.6 - 21.3 %   Platelets 301  150 - 400 K/uL   Neutrophils Relative % 41 (*) 43 - 77 %   Neutro Abs 4.1  1.7 - 7.7 K/uL   Lymphocytes Relative 40  12 - 46 %   Lymphs Abs 3.9  0.7 - 4.0 K/uL   Monocytes Relative 8  3 - 12 %   Monocytes Absolute 0.8  0.1 - 1.0 K/uL   Eosinophils Relative 11 (*) 0 - 5 %   Eosinophils Absolute 1.1 (*) 0.0 - 0.7 K/uL   Basophils Relative 1  0 - 1 %   Basophils Absolute 0.1  0.0 - 0.1 K/uL  COMPREHENSIVE METABOLIC PANEL       Result Value Range   Sodium 132 (*) 135 - 145 mEq/L   Potassium 3.8  3.5 - 5.1 mEq/L   Chloride 92 (*) 96 - 112 mEq/L   CO2 22  19 - 32 mEq/L   Glucose, Bld 90  70 - 99 mg/dL   BUN 21  6 - 23 mg/dL   Creatinine, Ser 0.86 (*) 0.50 - 1.10 mg/dL   Calcium 9.5  8.4 - 57.8 mg/dL   Total Protein 7.7  6.0 - 8.3 g/dL   Albumin 3.9  3.5 - 5.2 g/dL   AST 39 (*) 0 - 37 U/L   ALT 12  0 - 35 U/L   Alkaline Phosphatase 126 (*) 39 - 117 U/L   Total Bilirubin 0.4  0.3 - 1.2 mg/dL   GFR calc non Af Amer 43 (*) >90 mL/min   GFR calc Af Amer 49 (*) >90 mL/min  LIPASE, BLOOD      Result Value Range   Lipase 25  11 - 59 U/L  TROPONIN I      Result Value Range   Troponin I <0.30  <0.30 ng/mL   Dg Abd Acute W/chest 02/18/2013   *RADIOLOGY REPORT*  Clinical Data: Abdominal pain, nausea, vomiting, breast cancer, diabetes, hypertension, hyperlipidemia  ACUTE ABDOMEN SERIES (ABDOMEN 2 VIEW & CHEST 1 VIEW)  Comparison: 02/10/2013  Findings: Enlargement of cardiac silhouette. Calcified tortuous aorta. Pulmonary vascularity normal. Linear scarring versus chronic subsegmental atelectasis at right base. Lungs otherwise clear. No pleural effusion or pneumothorax. Bones demineralized. Nonobstructive bowel gas pattern. No bowel dilatation, bowel wall thickening, or free intraperitoneal air. Multilevel endplate spur formation thoracolumbar spine with question compression deformities at L2 and L1.  IMPRESSION: Enlargement of cardiac silhouette. Right basilar atelectasis versus scarring. No acute abdominal findings. Osseous demineralization with old appearing compression deformities at L1 and L2.   Original Report Authenticated By: Ulyses Southward, M.D.   Results for Kristin, Logan (MRN [id number]) as of 02/18/2013 17:20  Ref. Range 01/20/2013 05:30 01/21/2013 05:58 01/30/2013 15:35 02/10/2013 17:15 02/18/2013 15:35  BUN Latest Range: 6-23 mg/dL 9 10 12 15 21   Creatinine Latest Range: 0.50-1.10 mg/dL 4.13 2.44 0.10 2.72 (H)  1.18 (H)  GFR calc non Af Amer Latest Range: >90 mL/min 84 (L) 79 (L)  45 (L) 43 (L)  GFR calc Af Amer Latest Range: >90 mL/min >90 >90  53 (L) 49 (L)  1710:  Mildly orthostatic with SBP dropping to 110's from 130's.  HR remains 70-80's, controlled afib.  No N/V while in the ED.  Cr mildly elevated today but GFR significantly decreased from previous.  Dx and testing d/w pt and family.  Questions answered.  Verb understanding, agreeable to admit.  T/C to [facility]. Kerry Hough, case discussed, including:  HPI, pertinent PM/SHx, VS/PE, dx testing, ED course and treatment:  Agreeable to observation admit, requests to write temporary orders, obtain medical bed to team 2.          Laray Anger, DO 02/20/13 1905

## 2013-02-18 NOTE — ED Notes (Signed)
Pt brought from  [place] office with vomiting, and renal failure.

## 2013-02-18 NOTE — ED Notes (Signed)
Pt co leg pain/chronic, fatigue, N/V. Pt sent by PCP for abnormal lab results.

## 2013-02-19 ENCOUNTER — Inpatient Hospital Stay (HOSPITAL_COMMUNITY): Payer: Medicare Other

## 2013-02-19 DIAGNOSIS — I10 Essential (primary) hypertension: Secondary | ICD-10-CM

## 2013-02-19 DIAGNOSIS — R634 Abnormal weight loss: Secondary | ICD-10-CM

## 2013-02-19 DIAGNOSIS — R112 Nausea with vomiting, unspecified: Secondary | ICD-10-CM

## 2013-02-19 DIAGNOSIS — E119 Type 2 diabetes mellitus without complications: Secondary | ICD-10-CM

## 2013-02-19 LAB — PROTIME-INR
INR: 3.97 — ABNORMAL HIGH (ref 0.00–1.49)
Prothrombin Time: 36.4 seconds — ABNORMAL HIGH (ref 11.6–15.2)

## 2013-02-19 LAB — COMPREHENSIVE METABOLIC PANEL
ALT: 8 U/L (ref 0–35)
AST: 25 U/L (ref 0–37)
Albumin: 3.1 g/dL — ABNORMAL LOW (ref 3.5–5.2)
Alkaline Phosphatase: 97 U/L (ref 39–117)
BUN: 17 mg/dL (ref 6–23)
Chloride: 97 mEq/L (ref 96–112)
Potassium: 3.4 mEq/L — ABNORMAL LOW (ref 3.5–5.1)
Sodium: 135 mEq/L (ref 135–145)
Total Bilirubin: 0.4 mg/dL (ref 0.3–1.2)
Total Protein: 6.1 g/dL (ref 6.0–8.3)

## 2013-02-19 LAB — GLUCOSE, CAPILLARY: Glucose-Capillary: 80 mg/dL (ref 70–99)

## 2013-02-19 MED ORDER — BOOST / RESOURCE BREEZE PO LIQD
1.0000 | Freq: Three times a day (TID) | ORAL | Status: DC
  Administered 2013-02-19 – 2013-02-24 (×11): 1 via ORAL
  Filled 2013-02-19: qty 1

## 2013-02-19 MED ORDER — POTASSIUM CHLORIDE 10 MEQ/100ML IV SOLN
INTRAVENOUS | Status: AC
  Administered 2013-02-19: 10 meq via INTRAVENOUS
  Filled 2013-02-19: qty 100

## 2013-02-19 MED ORDER — COSYNTROPIN 0.25 MG IJ SOLR
0.2500 mg | Freq: Once | INTRAMUSCULAR | Status: AC
  Administered 2013-02-20: 0.25 mg via INTRAVENOUS
  Filled 2013-02-19: qty 0.25

## 2013-02-19 MED ORDER — LORAZEPAM 2 MG/ML IJ SOLN
0.5000 mg | Freq: Four times a day (QID) | INTRAMUSCULAR | Status: DC | PRN
  Administered 2013-02-19 – 2013-02-23 (×5): 0.5 mg via INTRAVENOUS
  Filled 2013-02-19 (×6): qty 1

## 2013-02-19 MED ORDER — VITAMIN K1 10 MG/ML IJ SOLN
10.0000 mg | Freq: Once | INTRAVENOUS | Status: AC
  Administered 2013-02-19: 10 mg via INTRAVENOUS
  Filled 2013-02-19: qty 1

## 2013-02-19 MED ORDER — POTASSIUM CHLORIDE 10 MEQ/100ML IV SOLN
10.0000 meq | INTRAVENOUS | Status: AC
  Administered 2013-02-19: 10 meq via INTRAVENOUS
  Filled 2013-02-19: qty 100

## 2013-02-19 NOTE — Progress Notes (Signed)
Inpatient Diabetes Program Recommendations  AACE/ADA: New Consensus Statement on Inpatient Glycemic Control (2013)  Target Ranges:  Prepandial:   less than 140 mg/dL      Peak postprandial:   less than 180 mg/dL (1-2 hours)      Critically ill patients:  140 - 180 mg/dL  Results for KAIDYN, HERNANDES (MRN [id number]) as of 02/19/2013 10:22  Ref. Range 02/18/2013 22:32 02/18/2013 22:56 02/19/2013 07:58  Glucose-Capillary Latest Range: 70-99 mg/dL 63 (L) 74 74   Results for DYMIN, DINGLEDINE (MRN [id number]) as of 02/19/2013 10:22  Ref. Range 02/18/2013 15:35 02/19/2013 05:35  Glucose Latest Range: 70-99 mg/dL 90 64 (L)  Results for JOCLYN, ALSOBROOK (MRN [id number]) as of 02/19/2013 10:22  Ref. Range 11/11/2012 01:08 01/14/2013 22:15  Hemoglobin A1C Latest Range: <5.7 % 5.8 (H) 5.8 (H)    Inpatient Diabetes Program Recommendations Correction (SSI): Please consider decreasing Novolog correction to sensitive scale. HgbA1C: May want to order an A1C.  Note: Patient has a history of diabetes and takes Amaryl 1mg  QAM at home for diabetes management.  Currently, patient is ordered to receive Novolog 0-15 units AC and Novolog 0-5 units QHS  for inpatient glycemic control.  Initial blood glucose on 02/18/13 @ 15:35 was 90 mg/dl.  Fasting lab glucose this morning was 64 mg/dl.  Patient also noted to be hypoglycemic on 5/20 @ 22:32 with blood glucose of 63 mg/dl.  Last A1C documented in the chart was 5.8%.  May want to order an A1C to get most current results.  Since patient has lost weight over the past few months, question if patient needs to continue Amaryl as an outpatient.  While inpatient, please decrease Novolog correction to sensitive scale.  Thanks, Orlando Penner, RN, MSN, CCRN Diabetes Coordinator Inpatient Diabetes Program [phone]

## 2013-02-19 NOTE — Care Management Note (Signed)
    Page 1 of 2   02/25/2013     10:20:15 AM   CARE MANAGEMENT NOTE 02/25/2013  Patient:  Kristin Logan, Kristin Logan   Account Number:  [id number]  Date Initiated:  02/19/2013  Documentation initiated by:  Sharrie Rothman  Subjective/Objective Assessment:   Pt admitted from home with intractable vomiting. Pts daughter lives with pt and provides 24 hour for pt. Pt has a CAP aide 7 days a week, 2 hours each day. Family is trying to have that exteneded. Pt is also active with [facility] RN. Pt has BSC,     Action/Plan:   CM will arrange resumption of [facility] at discharge and any DME needs pt may have.   Anticipated DC Date:  02/22/2013   Anticipated DC Plan:  HOME W HOME HEALTH SERVICES      DC Planning Services  CM consult      [facility] Choice  HOME HEALTH   Choice offered to / List presented to:  C-4 Adult Children        HH arranged  HH-1 RN      [facility] agency  [facility]   Status of service:  Completed, signed off Medicare Important Message given?  YES (If response is "NO", the following Medicare IM given date fields will be blank) Date Medicare IM given:  02/25/2013 Date Additional Medicare IM given:    Discharge Disposition:  HOME W HOME HEALTH SERVICES  Per UR Regulation:    If discussed at Long Length of Stay Meetings, dates discussed:    Comments:  02/25/13 1020 Arlyss Queen, RN BSN CM Pt discharged home today with resumption of [facility] RN. Alroy Bailiff of [facility] is aware and will collect the pts information from the chart. PT/INR to be drawn by [facility] and called to [facility] on Thursday, May 29. No DME needs noted. Pts daughter and pts nurse aware of discharge arrangements.  02/19/13 1335 Arlyss Queen, RN BSN CM Pt has hoverround and walker for home use. Family also stated that PT consult is not needed because pt is unable to work with PT due to bad knees.

## 2013-02-19 NOTE — Progress Notes (Signed)
[name] PROGRESS NOTE  Kristin Logan ZOX:[id number] DOB: 14-Apr-1932 DOA: 02/18/2013 PCP: [name], MD  Assessment/Plan: 1. Intractable vomiting. The etiology is unclear. GI has been consulted. Plan is likely for EGD tomorrow. Discussed with Dr. Dionicia Abler who and it was felt that [name]'s disease may play a role here. Review records indicate that patient had a serum cortisol checked in 4/14 which was in the lower side of normal. We will repeat this and performed an ACTH stimulation test. Gastroparesis may also be a possibility with a history of diabetes. We'll start the patient on liquid diet and keep her n.p.o. after midnight for possible EGD tomorrow. Patient's INR is elevated due to Coumadin, we will reverse this for procedure tomorrow. 2. Dehydration, improved with IV fluids 3. Diabetes. Continue sliding scale insulin 4. Chronic atrial fibrillation. Coumadin is being reversed for procedure tomorrow. 5. Hyponatremia, resolved. 6. Depression, continue Wellbutrin. 7. Hypertension. Stable on metoprolol and Norvasc.  Code Status: Full code Family Communication: Discussed with patient and daughter at the bedside Disposition Plan: Discharge home once improved   Consultants:  Gastroenterology  Procedures:  none  Antibiotics:  none  HPI/Subjective: Patient had some anxiety this morning requiring benzodiazepines. She has not had any vomiting since being in the hospital. She does still have some nausea, but feels that it has improved.  Objective: Filed Vitals:   02/18/13 1822 02/18/13 2238 02/19/13 0524 02/19/13 0955  BP: 120/64 130/72 144/80   Pulse: 62 59 59 72  Temp: 97.8 F (36.6 C) 98.2 F (36.8 C) 98.2 F (36.8 C)   TempSrc: Oral Oral Oral   Resp: 18 18 19    Height: 5\' 5"  (1.651 m)     Weight: 86.183 kg (190 lb)  88.2 kg (194 lb 7.1 oz)   SpO2: 96% 95% 97%     Intake/Output Summary (Last 24 hours) at 02/19/13 1242 Last data filed at 02/19/13 0500   Gross per 24 hour  Intake 1232.92 ml  Output    750 ml  Net 482.92 ml   Filed Weights   02/18/13 1501 02/18/13 1822 02/19/13 0524  Weight: 86.183 kg (190 lb) 86.183 kg (190 lb) 88.2 kg (194 lb 7.1 oz)    Exam:   General:  NAD  Cardiovascular: S1, S2 RRR  Respiratory: CTA B  Abdomen: soft, nt, nd,bs+  Musculoskeletal: no pedal edema b/l   Data Reviewed: Basic Metabolic Panel:  Recent Labs Lab 02/18/13 1535 02/19/13 0535  NA 132* 135  K 3.8 3.4*  CL 92* 97  CO2 22 26  GLUCOSE 90 64*  BUN 21 17  CREATININE 1.18* 1.21*  CALCIUM 9.5 8.6   Liver Function Tests:  Recent Labs Lab 02/18/13 1535 02/19/13 0535  AST 39* 25  ALT 12 8  ALKPHOS 126* 97  BILITOT 0.4 0.4  PROT 7.7 6.1  ALBUMIN 3.9 3.1*    Recent Labs Lab 02/18/13 1535  LIPASE 25   No results found for this basename: AMMONIA,  in the last 168 hours CBC:  Recent Labs Lab 02/18/13 1535  WBC 9.9  NEUTROABS 4.1  HGB 14.6  HCT 42.4  MCV 83.3  PLT 301   Cardiac Enzymes:  Recent Labs Lab 02/18/13 1535  TROPONINI <0.30   BNP (last 3 results)  Recent Labs  11/11/12 0108 01/14/13 2215  PROBNP 354.6 386.4   CBG:  Recent Labs Lab 02/18/13 2232 02/18/13 2256 02/19/13 0758 02/19/13 1154  GLUCAP 63* 74 74 73    No results found for  this or any previous visit (from the past 240 hour(s)).   Studies: Dg Abd Acute W/chest  02/18/2013   *RADIOLOGY REPORT*  Clinical Data: Abdominal pain, nausea, vomiting, breast cancer, diabetes, hypertension, hyperlipidemia  ACUTE ABDOMEN SERIES (ABDOMEN 2 VIEW & CHEST 1 VIEW)  Comparison: 02/10/2013  Findings: Enlargement of cardiac silhouette. Calcified tortuous aorta. Pulmonary vascularity normal. Linear scarring versus chronic subsegmental atelectasis at right base. Lungs otherwise clear. No pleural effusion or pneumothorax. Bones demineralized. Nonobstructive bowel gas pattern. No bowel dilatation, bowel wall thickening, or free intraperitoneal air.  Multilevel endplate spur formation thoracolumbar spine with question compression deformities at L2 and L1.  IMPRESSION: Enlargement of cardiac silhouette. Right basilar atelectasis versus scarring. No acute abdominal findings. Osseous demineralization with old appearing compression deformities at L1 and L2.   Original Report Authenticated By: Ulyses Southward, M.D.    Scheduled Meds: . amLODipine  5 mg Oral Daily  . buPROPion  37.5 mg Oral BID  . chlorhexidine  15 mL Mouth/Throat BID  . [START ON 02/20/2013] cosyntropin  0.25 mg Intravenous Once  . insulin aspart  0-15 Units Subcutaneous TID WC  . insulin aspart  0-5 Units Subcutaneous QHS  . levothyroxine  50 mcg Oral QAC breakfast  . metoprolol succinate  25 mg Oral q morning - 10a  . pantoprazole (PROTONIX) IV  40 mg Intravenous Q24H  . phytonadione (VITAMIN K) IV  10 mg Intravenous Once  . potassium chloride  10 mEq Intravenous Q1 Hr x 2  . simvastatin  20 mg Oral QHS  . Warfarin - Pharmacist Dosing Inpatient   Does not apply Q24H   Continuous Infusions: . dextrose 5 % and 0.9% NaCl 75 mL/hr at 02/18/13 2342    Active Problems:   DIABETES MELLITUS, TYPE II   HYPERTENSION   Atrial fibrillation, chronic   GERD   Chronic anticoagulation   Depression   Hypothyroidism   Vomiting and Abdominal Pain   Dehydration    Time spent:    [facility]  [name] Pager [phone]. If 7PM-7AM, please contact night-coverage at [email or web address], password [facility] 02/19/2013, 12:42 PM  LOS: 1 day

## 2013-02-19 NOTE — Progress Notes (Signed)
UR chart review completed.  

## 2013-02-19 NOTE — Consult Note (Addendum)
Reason for Consult: Intractable nausea and vomiting.  Referring Physician: Eloise Logan is an 79 y.o. female.  HPI: Admitted thru the ED yesterday from Dr. Fausto Skillern office for nausea and vomiting. Lisinopril, Lasix and KCL were stopped yesterday bu Dr. Fausto Skillern. She has had nausea and vomiting since February.  Nausea and vomiting on a daily basis. Her daughter tells me she has lost about 80 pounds since February.  She has pain in the back of neck. She has no abdominal pain.  She denies acid reflux or indigestion.  The smell of food makes her sick.  She has vomiting with water and food.   Appetite is poor. Her daughter tells me her mother is depressed since the symptoms started.  Her diet consists of crackers, milk, and buttermilk. She can keep cottage cheese down.  She does not like meat.  Usually has a BM every 3-4 days. She take Metamucil and Ducolax for her constipation.  Her daughter tells me she was taken off Celexa her last hospital admission due to a low sodium and started on Wellbutrin. She was admitted in April for same. Recent hx of hyponatremia and placed on fluid restrictions. Her daughter tells me her mother has always drank an excessive amt of water (at least a gallon). Her daughter tells me her mother craved water. She is basically bedridden due to her chronic knee problems. Hx of atrial fib and is chronic coumadin therapy. Hx of Heart valve replacement a couple of years ago at [facility] She has been a diabetic for over 30 yrs. Past Medical History  Diagnosis Date  . Diabetes mellitus   . Hyperlipidemia   . Eosinophilia   . Cataract   . Hypertension   . Atrial fibrillation prior to 2008    moderate biatrial enlargement in 2009; mild to moderate LVH with a low-normal EF  . GERD (gastroesophageal reflux disease)     Hemorrhoids; history of peptic ulcer disease  . DJD (degenerative joint disease) of knee   . Sleep apnea     CPAP  . Chronic anticoagulation   . Aortic  valve disease     2009: mild stenosis and regurgitation; peak gradient of 30-35 mmHg , subsequent AVR at [facility]  . Incontinence     Mild  . Carcinoma of breast     Right mastectomy; radiation and hormonal therapy  . Hip fracture, left 12/12/2012  . Foot injury 06/25/2012  . Degenerative joint disease of knee 01/29/2008    Bilateral    . BREAST CANCER, HX OF 01/29/2008    Lumpectomy, radiation therapy  Arimidex stopped in July 2013    . Syncope and collapse 11/11/2012  . Diverticulosis   . Umbilical hernia   . Cholelithiasis   . DDD (degenerative disc disease), lumbar   . Vertebral compression fracture     lumbar  . Chronic pain   . Paresthesias     feet    Past Surgical History  Procedure Laterality Date  . Exploration post operative open heart    . Colonoscopy      Approximately 2000  . Cardiac valve replacement      [facility]  . Cardiac valve replacement    . Breast surgery    . Breast lumpectomy      Family History  Problem Relation Age of Onset  . Heart disease Mother   . Stroke Father   . Cancer Sister   . Heart disease Sister   . Stroke Sister   .  Stroke Brother     Social History:  reports that she has never smoked. She does not have any smokeless tobacco history on file. She reports that she does not drink alcohol or use illicit drugs.  Allergies:  Allergies  Allergen Reactions  . Nsaids     Bleeding ulcer  . Codeine Nausea And Vomiting  . Adhesive (Tape) Rash    Redness, and peeling skin off.     Medications: I have reviewed the patient's current medications. Current facility-administered medications:acetaminophen (TYLENOL) suppository 650 mg, 650 mg, Rectal, Q6H PRN, Erick Blinks, MD;  acetaminophen (TYLENOL) tablet 650 mg, 650 mg, Oral, Q6H PRN, Erick Blinks, MD;  albuterol (PROVENTIL) (5 MG/ML) 0.5% nebulizer solution 2.5 mg, 2.5 mg, Nebulization, Q6H PRN, Erick Blinks, MD;  amLODipine (NORVASC) tablet 5 mg, 5 mg, Oral, Daily, Erick Blinks,  MD antiseptic oral rinse (BIOTENE) solution 15 mL, 15 mL, Mouth Rinse, PRN, Erick Blinks, MD;  buPROPion [facility]) tablet 37.5 mg, 37.5 mg, Oral, BID, Erick Blinks, MD, 37.5 mg at 02/18/13 2231;  chlorhexidine (PERIDEX) 0.12 % solution 15 mL, 15 mL, Mouth/Throat, BID, Erick Blinks, MD, 15 mL at 02/18/13 2233;  dextrose 5 %-0.9 % sodium chloride infusion, , Intravenous, Continuous, Vania Rea, MD, Last Rate: 75 mL/hr at 02/18/13 2342 insulin aspart (novoLOG) injection 0-15 Units, 0-15 Units, Subcutaneous, TID WC, Erick Blinks, MD;  insulin aspart (novoLOG) injection 0-5 Units, 0-5 Units, Subcutaneous, QHS, Erick Blinks, MD;  levothyroxine (SYNTHROID, LEVOTHROID) tablet 50 mcg, 50 mcg, Oral, QAC breakfast, Erick Blinks, MD;  metoprolol succinate (TOPROL-XL) 24 hr tablet 25 mg, 25 mg, Oral, q morning - 10a, Erick Blinks, MD ondansetron (ZOFRAN) injection 4 mg, 4 mg, Intravenous, Q6H PRN, Erick Blinks, MD, 4 mg at 02/18/13 2151;  ondansetron (ZOFRAN) tablet 4 mg, 4 mg, Oral, Q6H PRN, Erick Blinks, MD;  pantoprazole (PROTONIX) injection 40 mg, 40 mg, Intravenous, Q24H, Erick Blinks, MD, 40 mg at 02/18/13 2032;  simvastatin (ZOCOR) tablet 20 mg, 20 mg, Oral, QHS, Erick Blinks, MD, 20 mg at 02/18/13 2233 Warfarin - Pharmacist Dosing Inpatient, , Does not apply, Q24H, Erick Blinks, MD   Results for orders placed during the hospital encounter of 02/18/13 (from the past 48 hour(s))  PROTIME-INR     Status: Abnormal   Collection Time    02/18/13  3:35 PM      Result Value Range   Prothrombin Time 35.5 (*) 11.6 - 15.2 seconds   INR 3.84 (*) 0.00 - 1.49  CBC WITH DIFFERENTIAL     Status: Abnormal   Collection Time    02/18/13  3:35 PM      Result Value Range   WBC 9.9  4.0 - 10.5 K/uL   RBC 5.09  3.87 - 5.11 MIL/uL   Hemoglobin 14.6  12.0 - 15.0 g/dL   HCT 16.1  09.6 - 04.5 %   MCV 83.3  78.0 - 100.0 fL   MCH 28.7  26.0 - 34.0 pg   MCHC 34.4  30.0 - 36.0 g/dL   RDW 40.9   81.1 - 91.4 %   Platelets 301  150 - 400 K/uL   Neutrophils Relative % 41 (*) 43 - 77 %   Neutro Abs 4.1  1.7 - 7.7 K/uL   Lymphocytes Relative 40  12 - 46 %   Lymphs Abs 3.9  0.7 - 4.0 K/uL   Monocytes Relative 8  3 - 12 %   Monocytes Absolute 0.8  0.1 - 1.0 K/uL   Eosinophils Relative  11 (*) 0 - 5 %   Eosinophils Absolute 1.1 (*) 0.0 - 0.7 K/uL   Basophils Relative 1  0 - 1 %   Basophils Absolute 0.1  0.0 - 0.1 K/uL  COMPREHENSIVE METABOLIC PANEL     Status: Abnormal   Collection Time    02/18/13  3:35 PM      Result Value Range   Sodium 132 (*) 135 - 145 mEq/L   Potassium 3.8  3.5 - 5.1 mEq/L   Chloride 92 (*) 96 - 112 mEq/L   CO2 22  19 - 32 mEq/L   Glucose, Bld 90  70 - 99 mg/dL   BUN 21  6 - 23 mg/dL   Creatinine, Ser 8.65 (*) 0.50 - 1.10 mg/dL   Calcium 9.5  8.4 - 78.4 mg/dL   Total Protein 7.7  6.0 - 8.3 g/dL   Albumin 3.9  3.5 - 5.2 g/dL   AST 39 (*) 0 - 37 U/L   Comment: SLIGHT HEMOLYSIS   ALT 12  0 - 35 U/L   Alkaline Phosphatase 126 (*) 39 - 117 U/L   Total Bilirubin 0.4  0.3 - 1.2 mg/dL   GFR calc non Af Amer 43 (*) >90 mL/min   GFR calc Af Amer 49 (*) >90 mL/min   Comment:            The eGFR has been calculated     using the CKD EPI equation.     This calculation has not been     validated in all clinical     situations.     eGFR's persistently     <90 mL/min signify     possible Chronic Kidney Disease.  LIPASE, BLOOD     Status: None   Collection Time    02/18/13  3:35 PM      Result Value Range   Lipase 25  11 - 59 U/L  TROPONIN I     Status: None   Collection Time    02/18/13  3:35 PM      Result Value Range   Troponin I <0.30  <0.30 ng/mL   Comment:            Due to the release kinetics of cTnI,     a negative result within the first hours     of the onset of symptoms does not rule out     myocardial infarction with certainty.     If myocardial infarction is still suspected,     repeat the test at appropriate intervals.  URINALYSIS,  ROUTINE W REFLEX MICROSCOPIC     Status: Abnormal   Collection Time    02/18/13  4:53 PM      Result Value Range   Color, Urine YELLOW  YELLOW   APPearance CLEAR  CLEAR   Specific Gravity, Urine <1.005 (*) 1.005 - 1.030   pH 6.0  5.0 - 8.0   Glucose, UA NEGATIVE  NEGATIVE mg/dL   Hgb urine dipstick NEGATIVE  NEGATIVE   Bilirubin Urine NEGATIVE  NEGATIVE   Ketones, ur NEGATIVE  NEGATIVE mg/dL   Protein, ur NEGATIVE  NEGATIVE mg/dL   Urobilinogen, UA 0.2  0.0 - 1.0 mg/dL   Nitrite NEGATIVE  NEGATIVE   Leukocytes, UA NEGATIVE  NEGATIVE   Comment: MICROSCOPIC NOT DONE ON URINES WITH NEGATIVE PROTEIN, BLOOD, LEUKOCYTES, NITRITE, OR GLUCOSE <1000 mg/dL.  GLUCOSE, CAPILLARY     Status: Abnormal   Collection Time    02/18/13 10:32  PM      Result Value Range   Glucose-Capillary 63 (*) 70 - 99 mg/dL  GLUCOSE, CAPILLARY     Status: None   Collection Time    02/18/13 10:56 PM      Result Value Range   Glucose-Capillary 74  70 - 99 mg/dL   Comment 1 Notify RN     Comment 2 Documented in Chart    COMPREHENSIVE METABOLIC PANEL     Status: Abnormal   Collection Time    02/19/13  5:35 AM      Result Value Range   Sodium 135  135 - 145 mEq/L   Potassium 3.4 (*) 3.5 - 5.1 mEq/L   Chloride 97  96 - 112 mEq/L   CO2 26  19 - 32 mEq/L   Glucose, Bld 64 (*) 70 - 99 mg/dL   BUN 17  6 - 23 mg/dL   Creatinine, Ser 1.47 (*) 0.50 - 1.10 mg/dL   Calcium 8.6  8.4 - 82.9 mg/dL   Total Protein 6.1  6.0 - 8.3 g/dL   Albumin 3.1 (*) 3.5 - 5.2 g/dL   AST 25  0 - 37 U/L   ALT 8  0 - 35 U/L   Alkaline Phosphatase 97  39 - 117 U/L   Total Bilirubin 0.4  0.3 - 1.2 mg/dL   GFR calc non Af Amer 41 (*) >90 mL/min   GFR calc Af Amer 48 (*) >90 mL/min   Comment:            The eGFR has been calculated     using the CKD EPI equation.     This calculation has not been     validated in all clinical     situations.     eGFR's persistently     <90 mL/min signify     possible Chronic Kidney Disease.   PROTIME-INR     Status: Abnormal   Collection Time    02/19/13  5:35 AM      Result Value Range   Prothrombin Time 36.4 (*) 11.6 - 15.2 seconds   INR 3.97 (*) 0.00 - 1.49  GLUCOSE, CAPILLARY     Status: None   Collection Time    02/19/13  7:58 AM      Result Value Range   Glucose-Capillary 74  70 - 99 mg/dL    Dg Abd Acute W/chest  02/18/2013   *RADIOLOGY REPORT*  Clinical Data: Abdominal pain, nausea, vomiting, breast cancer, diabetes, hypertension, hyperlipidemia  ACUTE ABDOMEN SERIES (ABDOMEN 2 VIEW & CHEST 1 VIEW)  Comparison: 02/10/2013  Findings: Enlargement of cardiac silhouette. Calcified tortuous aorta. Pulmonary vascularity normal. Linear scarring versus chronic subsegmental atelectasis at right base. Lungs otherwise clear. No pleural effusion or pneumothorax. Bones demineralized. Nonobstructive bowel gas pattern. No bowel dilatation, bowel wall thickening, or free intraperitoneal air. Multilevel endplate spur formation thoracolumbar spine with question compression deformities at L2 and L1.  IMPRESSION: Enlargement of cardiac silhouette. Right basilar atelectasis versus scarring. No acute abdominal findings. Osseous demineralization with old appearing compression deformities at L1 and L2.   Original Report Authenticated By: Ulyses Southward, M.D.    ROS Blood pressure 144/80, pulse 59, temperature 98.2 F (36.8 C), temperature source Oral, resp. rate 19, height 5\' 5"  (1.651 m), weight 194 lb 7.1 oz (88.2 kg), SpO2 97.00%. Physical Exam Alert and oriented. Skin warm and dry. Oral mucosa is moist.   . Sclera anicteric, conjunctivae is pink. Thyroid not enlarged. No cervical lymphadenopathy.  Lungs clear. Heart regular rate and rhythm.  Abdomen is soft. Bowel sounds are positive. Abdomen is obese. No hepatomegaly. No abdominal masses felt. No tenderness.  Brownish discoloration to her lower extremities..    Assessment/Plan: Intractable nausea and vomiting. ? Etiology. Possible related to her  depression. Possible gastroparesis. I will discuss with Dr. Karilyn Cota.  Llana Aliment 02/19/2013, 8:28 AM     GI attending note; Patient interviewed and examined; Lab studies as well as abdominopelvic CT from 01/14/2013 reviewed as well as chest CT from 01/15/2013. Patient has documented weight loss of over 60 pounds in 9 months. According to her daughter she has lost 80 pounds. She has had vomiting spells for 3 months. She denies abdominal pain. Abdominal examination is within normal limits except abdomen is protuberant. Her symptom complex raises several diagnostic possibilities including peptic ulcer disease, pyloric stenosis as well as gastroparesis. She is also being evaluated for [name]'s disease. All of these conditions need to be ruled out before we make a diagnosis of psychogenic vomiting. Condition reviewed with her daughter. Will plan diagnostic EGD in a.m. if INR is less than 2.

## 2013-02-19 NOTE — Progress Notes (Signed)
ANTICOAGULATION CONSULT NOTE  Pharmacy Consult for Warfarin Indication: atrial fibrillation  Allergies  Allergen Reactions  . Nsaids     Bleeding ulcer  . Codeine Nausea And Vomiting  . Adhesive (Tape) Rash    Redness, and peeling skin off.     Patient Measurements: Height: 5\' 5"  (165.1 cm) Weight: 194 lb 7.1 oz (88.2 kg) IBW/kg (Calculated) : 57   Vital Signs: Temp: 98.2 F (36.8 C) (05/21 0524) Temp src: Oral (05/21 0524) BP: 144/80 mmHg (05/21 0524) Pulse Rate: 72 (05/21 0955)  Labs:  Recent Labs  02/18/13 1535 02/19/13 0535  HGB 14.6  --   HCT 42.4  --   PLT 301  --   LABPROT 35.5* 36.4*  INR 3.84* 3.97*  CREATININE 1.18* 1.21*  TROPONINI <0.30  --     Estimated Creatinine Clearance: 41.4 ml/min (by C-G formula based on Cr of 1.21).   Medical History: Past Medical History  Diagnosis Date  . Diabetes mellitus   . Hyperlipidemia   . Eosinophilia   . Cataract   . Hypertension   . Atrial fibrillation prior to 2008    moderate biatrial enlargement in 2009; mild to moderate LVH with a low-normal EF  . GERD (gastroesophageal reflux disease)     Hemorrhoids; history of peptic ulcer disease  . DJD (degenerative joint disease) of knee   . Sleep apnea     CPAP  . Chronic anticoagulation   . Aortic valve disease     2009: mild stenosis and regurgitation; peak gradient of 30-35 mmHg , subsequent [facility]R at [facility]  . Incontinence     Mild  . Carcinoma of breast     Right mastectomy; radiation and hormonal therapy  . Hip fracture, left 12/12/2012  . Foot injury 06/25/2012  . Degenerative joint disease of knee 01/29/2008    Bilateral    . BREAST CANCER, HX OF 01/29/2008    Lumpectomy, radiation therapy  Arimidex stopped in July 2013    . Syncope and collapse 11/11/2012  . Diverticulosis   . Umbilical hernia   . Cholelithiasis   . DDD (degenerative disc disease), lumbar   . Vertebral compression fracture     lumbar  . Chronic pain   . Paresthesias     feet     Medications:  Prescriptions prior to admission  Medication Sig Dispense Refill  . albuterol (PROVENTIL HFA;VENTOLIN HFA) 108 (90 BASE) MCG/ACT inhaler Inhale 2 puffs into the lungs every 4 (four) hours as needed for wheezing.  1 Inhaler  2  . albuterol (PROVENTIL) (2.5 MG/3ML) 0.083% nebulizer solution Take 3 mLs (2.5 mg total) by nebulization every 6 (six) hours as needed for wheezing.  75 mL  3  . amLODipine (NORVASC) 5 MG tablet Take 5 mg by mouth daily.      . bisacodyl (DULCOLAX) 10 MG suppository Place 1 suppository (10 mg total) rectally daily as needed for constipation.  12 suppository  2  . bisacodyl (DULCOLAX) 5 MG EC tablet Take 5 mg by mouth daily as needed for constipation.      Marland Kitchen buPROPion (WELLBUTRIN) 75 MG tablet Take 0.5 tablets (37.5 mg total) by mouth 2 (two) times daily.  60 tablet  1  . clotrimazole-betamethasone (LOTRISONE) cream Apply 1 application topically 2 (two) times daily.      Marland Kitchen glimepiride (AMARYL) 1 MG tablet Take 1 mg by mouth daily before breakfast.      . HYDROcodone-acetaminophen (NORCO) 5-325 MG per tablet Take 1 tablet by  mouth every 6 (six) hours as needed for pain.  60 tablet  2  . levothyroxine (SYNTHROID, LEVOTHROID) 50 MCG tablet Take 50 mcg by mouth daily. To take 30 minutes prior to all other medications      . metoprolol succinate (TOPROL-XL) 25 MG 24 hr tablet Take 25 mg by mouth every morning.       Marland Kitchen omeprazole (PRILOSEC) 20 MG capsule Take 20 mg by mouth daily.      . ondansetron (ZOFRAN-ODT) 4 MG disintegrating tablet Take 4 mg by mouth every 6 (six) hours as needed for nausea.      . simvastatin (ZOCOR) 20 MG tablet Take 1 tablet (20 mg total) by mouth at bedtime.  30 tablet  6  . warfarin (COUMADIN) 5 MG tablet Take 5 mg by mouth daily.       Marland Kitchen acetaminophen (TYLENOL) 650 MG CR tablet Take 1 tablet (650 mg total) by mouth every 8 (eight) hours as needed for pain.      . calcium carbonate (OS-CAL) 600 MG TABS Take 600 mg by mouth daily.       . diclofenac sodium (VOLTAREN) 1 % GEL Apply 2 g topically daily as needed (Pain).      Marland Kitchen lactulose (CHRONULAC) 10 GM/15ML solution Take 30 g by mouth daily as needed. Take 30ml daily for constipation as needed      . Multiple Vitamin (MULTIVITAMIN) tablet Take 1 tablet by mouth daily.      . polyethylene glycol (MIRALAX / GLYCOLAX) packet Take 17 g by mouth 2 (two) times daily.        Assessment: Patient is on chronic warfarin 5mg  daily for Afib. Elevated INR was elevated on admission & remains supraherapeutic today. Awaiting GI eval.   No bleeding noted.   Goal of Therapy:  INR 2-3   Plan:  Continue to hold warfarin. Daily PT/INR.  Elson Clan 02/19/2013,10:13 AM

## 2013-02-19 NOTE — Progress Notes (Signed)
Patient had an episode of blood sugar hypoglycemia with a reading of 63.  She drank orange juice with 15 g of sugar and it was rechecked in 15 minutes with a blood sugar reading of 74.  Patient was asymptomatic throughout.  Patient is scheduled to be NPO after midnight for a procedure and IVF were changed from NS to D%/NS @ 75 ml/hr per call to on-call MD.

## 2013-02-19 NOTE — Progress Notes (Signed)
INITIAL NUTRITION ASSESSMENT  DOCUMENTATION CODES Per approved criteria  -Severe malnutrition in the context of chronic illness -Obesity Class I   INTERVENTION:  Resource Breeze po TID, each supplement provides 250 kcal and 9 grams of protein.  RD will continue to follow diet advancement/ nutrition support measures  NUTRITION DIAGNOSIS: Malnutrition related to intractable nausea and vomiting as evidenced by pt diet hx, severe wt loss since February.   Goal: Pt to meet >/= 90% of their estimated nutrition needs  Monitor:  Diet advancement, adequacy of po intake, labs and wt trends  Reason for Assessment: Malnutrition Screen =5  79 y.o. female  Admitting problem: intractable nausea and vomiting  ASSESSMENT: Spoke with pt and her son during visit. Ongoing vomiting and severe wt loss since February.  Diet has been advanced to full liquids and nursing reports no vomiting thus far today. She is scheduled for EGD tomorrow.   Pt meets criteria for severe malnutrition in the context of chronic illness given her severe wt loss (>7.5% in 3 months) and energy intake </= 75% for >/= 1 month.  Height: Ht Readings from Last 1 Encounters:  02/18/13 5\' 5"  (1.651 m)    Weight: Wt Readings from Last 1 Encounters:  02/19/13 194 lb 7.1 oz (88.2 kg)    Ideal Body Weight: 125# (56.8 kg)  % Ideal Body Weight: 155%  Wt Readings from Last 10 Encounters:  02/19/13 194 lb 7.1 oz (88.2 kg)  02/10/13 195 lb (88.451 kg)  01/30/13 207 lb (93.895 kg)  01/21/13 216 lb 0.8 oz (98 kg)  12/31/12 220 lb (99.791 kg)  12/27/12 211 lb (95.709 kg)  12/25/12 220 lb (99.791 kg)  12/10/12 220 lb 0.6 oz (99.809 kg)  11/13/12 218 lb (98.884 kg)  11/12/12 234 lb 12.6 oz (106.5 kg)    Usual Body Weight: 235#   % Usual Body Weight: 83%  BMI:  Body mass index is 32.36 kg/(m^2). Obesity Class I  Estimated Nutritional Needs: Kcal: 1300-1500 Protein: 75-85 gr Fluid: > 2000 ml/day  Skin: No issues  noted  Diet Order: Full Liquids   EDUCATION NEEDS: -Education needs addressed   Intake/Output Summary (Last 24 hours) at 02/19/13 1043 Last data filed at 02/19/13 0500  Gross per 24 hour  Intake 1232.92 ml  Output    750 ml  Net 482.92 ml    Last BM: 02/16/13   Labs:   Recent Labs Lab 02/18/13 1535 02/19/13 0535  NA 132* 135  K 3.8 3.4*  CL 92* 97  CO2 22 26  BUN 21 17  CREATININE 1.18* 1.21*  CALCIUM 9.5 8.6  GLUCOSE 90 64*    CBG (last 3)   Recent Labs  02/18/13 2232 02/18/13 2256 02/19/13 0758  GLUCAP 63* 74 74    Scheduled Meds: . amLODipine  5 mg Oral Daily  . buPROPion  37.5 mg Oral BID  . chlorhexidine  15 mL Mouth/Throat BID  . insulin aspart  0-15 Units Subcutaneous TID WC  . insulin aspart  0-5 Units Subcutaneous QHS  . levothyroxine  50 mcg Oral QAC breakfast  . metoprolol succinate  25 mg Oral q morning - 10a  . pantoprazole (PROTONIX) IV  40 mg Intravenous Q24H  . simvastatin  20 mg Oral QHS  . Warfarin - Pharmacist Dosing Inpatient   Does not apply Q24H    Continuous Infusions: . dextrose 5 % and 0.9% NaCl 75 mL/hr at 02/18/13 2342    Past Medical History  Diagnosis Date  .  Diabetes mellitus   . Hyperlipidemia   . Eosinophilia   . Cataract   . Hypertension   . Atrial fibrillation prior to 2008    moderate biatrial enlargement in 2009; mild to moderate LVH with a low-normal EF  . GERD (gastroesophageal reflux disease)     Hemorrhoids; history of peptic ulcer disease  . DJD (degenerative joint disease) of knee   . Sleep apnea     CPAP  . Chronic anticoagulation   . Aortic valve disease     2009: mild stenosis and regurgitation; peak gradient of 30-35 mmHg , subsequent [facility]R at [facility]  . Incontinence     Mild  . Carcinoma of breast     Right mastectomy; radiation and hormonal therapy  . Hip fracture, left 12/12/2012  . Foot injury 06/25/2012  . Degenerative joint disease of knee 01/29/2008    Bilateral    . BREAST CANCER, HX OF  01/29/2008    Lumpectomy, radiation therapy  Arimidex stopped in July 2013    . Syncope and collapse 11/11/2012  . Diverticulosis   . Umbilical hernia   . Cholelithiasis   . DDD (degenerative disc disease), lumbar   . Vertebral compression fracture     lumbar  . Chronic pain   . Paresthesias     feet    Past Surgical History  Procedure Laterality Date  . Exploration post operative open heart    . Colonoscopy      Approximately 2000  . Cardiac valve replacement      [facility]  . Cardiac valve replacement    . Breast surgery    . Breast lumpectomy      Royann Shivers MS,RD,LDN,CSG Office: [phone] Pager: [phone]

## 2013-02-19 NOTE — Progress Notes (Signed)
Patient evaluated for community based chronic disease management services with [facility] Care Management Program as a benefit of patient's [employer].  Spoke with patient, son, and live in caregiver (daughter) at bedside to explain [facility] Care Management services.  Patient's primary medical issue since February has been ongoing vomiting.  The family is able to advocate for her and manage her care without difficulty.  It was agreed that [facility] services may not be appropriate at this time.  Inpatient RNCM Tammy Blackwell stepped in during consult to discuss family's concerns with how the patient plan of care would proceed.  Left contact information and [facility] literature at bedside.  Of note, [facility] Care Management services does not replace or interfere with any services that are arranged by inpatient case management or social work.  For additional questions or referrals please contact Anibal Henderson BSN RN [facility] Liaison at [phone].

## 2013-02-20 ENCOUNTER — Encounter (HOSPITAL_COMMUNITY)
Admission: EM | Disposition: A | Discharge status: Home Health | Payer: Self-pay | Source: Home / Self Care | Attending: Internal Medicine

## 2013-02-20 ENCOUNTER — Inpatient Hospital Stay (HOSPITAL_COMMUNITY): Payer: Medicare Other

## 2013-02-20 ENCOUNTER — Encounter (HOSPITAL_COMMUNITY): Payer: Self-pay | Admitting: *Deleted

## 2013-02-20 DIAGNOSIS — K449 Diaphragmatic hernia without obstruction or gangrene: Secondary | ICD-10-CM

## 2013-02-20 DIAGNOSIS — K228 Other specified diseases of esophagus: Secondary | ICD-10-CM

## 2013-02-20 DIAGNOSIS — Z7901 Long term (current) use of anticoagulants: Secondary | ICD-10-CM

## 2013-02-20 DIAGNOSIS — E039 Hypothyroidism, unspecified: Secondary | ICD-10-CM

## 2013-02-20 DIAGNOSIS — K2289 Other specified disease of esophagus: Secondary | ICD-10-CM

## 2013-02-20 HISTORY — PX: ESOPHAGOGASTRODUODENOSCOPY: SHX5428

## 2013-02-20 LAB — CBC
HCT: 34.8 % — ABNORMAL LOW (ref 36.0–46.0)
Hemoglobin: 11.9 g/dL — ABNORMAL LOW (ref 12.0–15.0)
MCH: 28.7 pg (ref 26.0–34.0)
MCHC: 34.2 g/dL (ref 30.0–36.0)
MCV: 84.1 fL (ref 78.0–100.0)
RBC: 4.14 MIL/uL (ref 3.87–5.11)

## 2013-02-20 LAB — BASIC METABOLIC PANEL
BUN: 9 mg/dL (ref 6–23)
CO2: 25 mEq/L (ref 19–32)
Calcium: 8.1 mg/dL — ABNORMAL LOW (ref 8.4–10.5)
Creatinine, Ser: 1.01 mg/dL (ref 0.50–1.10)
GFR calc non Af Amer: 52 mL/min — ABNORMAL LOW (ref >90)
Glucose, Bld: 68 mg/dL — ABNORMAL LOW (ref 70–99)

## 2013-02-20 LAB — GLUCOSE, CAPILLARY
Glucose-Capillary: 81 mg/dL (ref 70–99)
Glucose-Capillary: 128 mg/dL — ABNORMAL HIGH (ref 70–99)

## 2013-02-20 LAB — PROTIME-INR: Prothrombin Time: 18.3 seconds — ABNORMAL HIGH (ref 11.6–15.2)

## 2013-02-20 SURGERY — EGD (ESOPHAGOGASTRODUODENOSCOPY)
Anesthesia: Moderate Sedation

## 2013-02-20 MED ORDER — WARFARIN SODIUM 5 MG PO TABS
5.0000 mg | ORAL_TABLET | Freq: Once | ORAL | Status: AC
  Administered 2013-02-20: 5 mg via ORAL
  Filled 2013-02-20: qty 1

## 2013-02-20 MED ORDER — STERILE WATER FOR IRRIGATION IR SOLN
Status: DC | PRN
  Administered 2013-02-20: 16:00:00

## 2013-02-20 MED ORDER — DEXTROSE IN LACTATED RINGERS 5 % IV SOLN
Freq: Once | INTRAVENOUS | Status: AC
  Administered 2013-02-20: 500 mL via INTRAVENOUS

## 2013-02-20 MED ORDER — BUTAMBEN-TETRACAINE-BENZOCAINE 2-2-14 % EX AERO
INHALATION_SPRAY | CUTANEOUS | Status: DC | PRN
  Administered 2013-02-20: 2 via TOPICAL

## 2013-02-20 MED ORDER — MEPERIDINE HCL 50 MG/ML IJ SOLN
INTRAMUSCULAR | Status: AC
  Filled 2013-02-20: qty 1

## 2013-02-20 MED ORDER — SODIUM CHLORIDE 0.9 % IV SOLN: INTRAVENOUS | Status: DC

## 2013-02-20 MED ORDER — MEPERIDINE HCL 25 MG/ML IJ SOLN
INTRAMUSCULAR | Status: DC | PRN
  Administered 2013-02-20: 25 mg via INTRAVENOUS

## 2013-02-20 MED ORDER — MIDAZOLAM HCL 5 MG/5ML IJ SOLN
INTRAMUSCULAR | Status: DC | PRN
  Administered 2013-02-20 (×2): 2 mg via INTRAVENOUS

## 2013-02-20 MED ORDER — MIDAZOLAM HCL 5 MG/5ML IJ SOLN
INTRAMUSCULAR | Status: AC
  Filled 2013-02-20: qty 10

## 2013-02-20 NOTE — Progress Notes (Signed)
[name] PROGRESS NOTE  Kristin Logan:[id number] DOB: [birth year] DOA: 02/18/2013 PCP: [name], MD  Assessment/Plan: 1. Intractable vomiting. The etiology is unclear. GI has been consulted. Plan is for EGD today. Discussed with Dr. Karilyn Cota and it was felt that [name]'s disease may be playing a role here. Review of records indicate that patient had a serum cortisol checked in 12/2012 which was on the lower side of normal. We have performed an ACTH stimulation test, with results currently pending. Gastroparesis may also be a possibility with a history of diabetes. Once patient has undergone endoscopy, her diet can likely be advanced as tolerated. 2. Dehydration, improved with IV fluids 3. Diabetes. Patient has been becoming hypoglycemic. This may be due to decreased by mouth intake. We'll discontinue sliding scale insulin and monitor for now. 4. Chronic atrial fibrillation. INR has been reversed for endoscopy today. This can be resumed after procedures complete. 5. Hyponatremia, resolved. 6. Depression, continue Wellbutrin. 7. Hypertension. Stable on metoprolol and Norvasc.  Code Status: Full code Family Communication: Discussed with patient and daughter at the bedside Disposition Plan: Discharge home once improved   Consultants:  Gastroenterology  Procedures:  none  Antibiotics:  none  HPI/Subjective: Patient feels very anxious this morning. She's not had any further nausea or vomiting.  Objective: Filed Vitals:   02/19/13 1530 02/19/13 2100 02/20/13 0525 02/20/13 0805  BP: 134/70 132/78 132/68   Pulse: 77 50 61 63  Temp: 98.6 F (37 C) 97.3 F (36.3 C) 98 F (36.7 C)   TempSrc: Oral Oral Oral   Resp: 18 20 18 17   Height:      Weight:   88.3 kg (194 lb 10.7 oz)   SpO2: 98% 94% 98% 97%    Intake/Output Summary (Last 24 hours) at 02/20/13 1225 Last data filed at 02/20/13 1008  Gross per 24 hour  Intake   2400 ml  Output   2375 ml  Net     25  ml   Filed Weights   02/18/13 1822 02/19/13 0524 02/20/13 0525  Weight: 86.183 kg (190 lb) 88.2 kg (194 lb 7.1 oz) 88.3 kg (194 lb 10.7 oz)    Exam:   General:  NAD  Cardiovascular: S1, S2 RRR  Respiratory: CTA B  Abdomen: soft, nt, nd,bs+  Musculoskeletal: no pedal edema b/l   Data Reviewed: Basic Metabolic Panel:  Recent Labs Lab 02/18/13 1535 02/19/13 0535 02/20/13 0525  NA 132* 135 135  K 3.8 3.4* 3.1*  CL 92* 97 100  CO2 22 26 25   GLUCOSE 90 64* 68*  BUN 21 17 9   CREATININE 1.18* 1.21* 1.01  CALCIUM 9.5 8.6 8.1*   Liver Function Tests:  Recent Labs Lab 02/18/13 1535 02/19/13 0535  AST 39* 25  ALT 12 8  ALKPHOS 126* 97  BILITOT 0.4 0.4  PROT 7.7 6.1  ALBUMIN 3.9 3.1*    Recent Labs Lab 02/18/13 1535  LIPASE 25   No results found for this basename: AMMONIA,  in the last 168 hours CBC:  Recent Labs Lab 02/18/13 1535 02/20/13 0525  WBC 9.9 5.5  NEUTROABS 4.1  --   HGB 14.6 11.9*  HCT 42.4 34.8*  MCV 83.3 84.1  PLT 301 220   Cardiac Enzymes:  Recent Labs Lab 02/18/13 1535  TROPONINI <0.30   BNP (last 3 results)  Recent Labs  11/11/12 0108 01/14/13 2215  PROBNP 354.6 386.4   CBG:  Recent Labs Lab 02/19/13 1154 02/19/13 1652 02/19/13 2205 02/20/13 0747  02/20/13 1211  GLUCAP 73 121* 80 81 82    Recent Results (from the past 240 hour(s))  URINE CULTURE     Status: None   Collection Time    02/18/13  4:53 PM      Result Value Range Status   Specimen Description URINE, CLEAN CATCH   Final   Special Requests NONE   Final   Culture  Setup Time 02/19/2013 01:29   Final   Colony Count NO GROWTH   Final   Culture NO GROWTH   Final   Report Status 02/19/2013 FINAL   Final     Studies: Dg Abd Acute W/chest  02/18/2013   *RADIOLOGY REPORT*  Clinical Data: Abdominal pain, nausea, vomiting, breast cancer, diabetes, hypertension, hyperlipidemia  ACUTE ABDOMEN SERIES (ABDOMEN 2 VIEW & CHEST 1 VIEW)  Comparison: 02/10/2013   Findings: Enlargement of cardiac silhouette. Calcified tortuous aorta. Pulmonary vascularity normal. Linear scarring versus chronic subsegmental atelectasis at right base. Lungs otherwise clear. No pleural effusion or pneumothorax. Bones demineralized. Nonobstructive bowel gas pattern. No bowel dilatation, bowel wall thickening, or free intraperitoneal air. Multilevel endplate spur formation thoracolumbar spine with question compression deformities at L2 and L1.  IMPRESSION: Enlargement of cardiac silhouette. Right basilar atelectasis versus scarring. No acute abdominal findings. Osseous demineralization with old appearing compression deformities at L1 and L2.   Original Report Authenticated By: Ulyses Southward, M.D.    Scheduled Meds: . amLODipine  5 mg Oral Daily  . buPROPion  37.5 mg Oral BID  . chlorhexidine  15 mL Mouth/Throat BID  . feeding supplement  1 Container Oral TID BM  . levothyroxine  50 mcg Oral QAC breakfast  . metoprolol succinate  25 mg Oral q morning - 10a  . pantoprazole (PROTONIX) IV  40 mg Intravenous Q24H  . simvastatin  20 mg Oral QHS  . Warfarin - Pharmacist Dosing Inpatient   Does not apply Q24H   Continuous Infusions: . dextrose 5 % and 0.9% NaCl 75 mL/hr at 02/19/13 2247    Active Problems:   DIABETES MELLITUS, TYPE II   HYPERTENSION   Atrial fibrillation, chronic   GERD   Chronic anticoagulation   Depression   Hypothyroidism   Vomiting and Abdominal Pain   Dehydration    Time spent:    [facility]  [name] Pager [phone]. If 7PM-7AM, please contact night-coverage at [email or web address], password [facility] 02/20/2013, 12:25 PM  LOS: 2 days

## 2013-02-20 NOTE — Progress Notes (Signed)
ANTICOAGULATION CONSULT NOTE  Pharmacy Consult for Warfarin Indication: atrial fibrillation  Allergies  Allergen Reactions  . Nsaids     Bleeding ulcer  . Codeine Nausea And Vomiting  . Adhesive (Tape) Rash    Redness, and peeling skin off.     Patient Measurements: Height: 5\' 5"  (165.1 cm) Weight: 194 lb 10.7 oz (88.3 kg) IBW/kg (Calculated) : 57   Vital Signs: Temp: 97.7 F (36.5 C) (05/22 1523) Temp src: Oral (05/22 1523) BP: 119/58 mmHg (05/22 1620) Pulse Rate: 67 (05/22 1620)  Labs:  Recent Labs  02/18/13 1535 02/19/13 0535 02/20/13 0525  HGB 14.6  --  11.9*  HCT 42.4  --  34.8*  PLT 301  --  220  LABPROT 35.5* 36.4* 18.3*  INR 3.84* 3.97* 1.57*  CREATININE 1.18* 1.21* 1.01  TROPONINI <0.30  --   --     Estimated Creatinine Clearance: 49.6 ml/min (by C-G formula based on Cr of 1.01).   Medical History: Past Medical History  Diagnosis Date  . Diabetes mellitus   . Hyperlipidemia   . Eosinophilia   . Cataract   . Hypertension   . Atrial fibrillation prior to 2008    moderate biatrial enlargement in 2009; mild to moderate LVH with a low-normal EF  . GERD (gastroesophageal reflux disease)     Hemorrhoids; history of peptic ulcer disease  . DJD (degenerative joint disease) of knee   . Sleep apnea     CPAP  . Chronic anticoagulation   . Aortic valve disease     2009: mild stenosis and regurgitation; peak gradient of 30-35 mmHg , subsequent [facility]R at [facility]  . Incontinence     Mild  . Carcinoma of breast     Right mastectomy; radiation and hormonal therapy  . Hip fracture, left 12/12/2012  . Foot injury 06/25/2012  . Degenerative joint disease of knee 01/29/2008    Bilateral    . BREAST CANCER, HX OF 01/29/2008    Lumpectomy, radiation therapy  Arimidex stopped in July 2013    . Syncope and collapse 11/11/2012  . Diverticulosis   . Umbilical hernia   . Cholelithiasis   . DDD (degenerative disc disease), lumbar   . Vertebral compression fracture    lumbar  . Chronic pain   . Paresthesias     feet    Medications:  Prescriptions prior to admission  Medication Sig Dispense Refill  . albuterol (PROVENTIL HFA;VENTOLIN HFA) 108 (90 BASE) MCG/ACT inhaler Inhale 2 puffs into the lungs every 4 (four) hours as needed for wheezing.  1 Inhaler  2  . albuterol (PROVENTIL) (2.5 MG/3ML) 0.083% nebulizer solution Take 3 mLs (2.5 mg total) by nebulization every 6 (six) hours as needed for wheezing.  75 mL  3  . amLODipine (NORVASC) 5 MG tablet Take 5 mg by mouth daily.      . bisacodyl (DULCOLAX) 10 MG suppository Place 1 suppository (10 mg total) rectally daily as needed for constipation.  12 suppository  2  . bisacodyl (DULCOLAX) 5 MG EC tablet Take 5 mg by mouth daily as needed for constipation.      Marland Kitchen buPROPion (WELLBUTRIN) 75 MG tablet Take 0.5 tablets (37.5 mg total) by mouth 2 (two) times daily.  60 tablet  1  . clotrimazole-betamethasone (LOTRISONE) cream Apply 1 application topically 2 (two) times daily.      Marland Kitchen glimepiride (AMARYL) 1 MG tablet Take 1 mg by mouth daily before breakfast.      . HYDROcodone-acetaminophen (  NORCO) 5-325 MG per tablet Take 1 tablet by mouth every 6 (six) hours as needed for pain.  60 tablet  2  . levothyroxine (SYNTHROID, LEVOTHROID) 50 MCG tablet Take 50 mcg by mouth daily. To take 30 minutes prior to all other medications      . metoprolol succinate (TOPROL-XL) 25 MG 24 hr tablet Take 25 mg by mouth every morning.       Marland Kitchen omeprazole (PRILOSEC) 20 MG capsule Take 20 mg by mouth daily.      . ondansetron (ZOFRAN-ODT) 4 MG disintegrating tablet Take 4 mg by mouth every 6 (six) hours as needed for nausea.      . simvastatin (ZOCOR) 20 MG tablet Take 1 tablet (20 mg total) by mouth at bedtime.  30 tablet  6  . warfarin (COUMADIN) 5 MG tablet Take 5 mg by mouth daily.       Marland Kitchen acetaminophen (TYLENOL) 650 MG CR tablet Take 1 tablet (650 mg total) by mouth every 8 (eight) hours as needed for pain.      . calcium carbonate  (OS-CAL) 600 MG TABS Take 600 mg by mouth daily.      . diclofenac sodium (VOLTAREN) 1 % GEL Apply 2 g topically daily as needed (Pain).      Marland Kitchen lactulose (CHRONULAC) 10 GM/15ML solution Take 30 g by mouth daily as needed. Take 30ml daily for constipation as needed      . Multiple Vitamin (MULTIVITAMIN) tablet Take 1 tablet by mouth daily.      . polyethylene glycol (MIRALAX / GLYCOLAX) packet Take 17 g by mouth 2 (two) times daily.        Assessment: Patient is on chronic warfarin 5mg  daily for Afib. Elevated INR was elevated on admission.  Patient received 10mg  IV Vit K on 5/21 to reverse INR and is s/p EGD today.  OK to resume warfarin.    Goal of Therapy:  INR 2-3   Plan:  Coumadin 5mg  po x1 today Daily PT/INR.  Elson Clan 02/20/2013,4:45 PM

## 2013-02-20 NOTE — Progress Notes (Signed)
Inpatient Diabetes Program Recommendations  AACE/ADA: New Consensus Statement on Inpatient Glycemic Control (2013)  Target Ranges:  Prepandial:   less than 140 mg/dL      Peak postprandial:   less than 180 mg/dL (1-2 hours)      Critically ill patients:  140 - 180 mg/dL   Results for MILAGRO, BELMARES (MRN [id number]) as of 02/20/2013 08:06  Ref. Range 02/19/2013 05:35 02/20/2013 05:25  Glucose Latest Range: 70-99 mg/dL 64 (L) 68 (L)   Inpatient Diabetes Program Recommendations Correction (SSI): Please consider decreasing Novolog correction to sensitive scale.   Note: Noted fasting blood glucose has been low over the past two mornings.  Currently patient is NPO for possible EGD procedure today and is ordered D5NS@75  ml/hr.  If appropriate for patient, may want to increase IVF rate.  Please consider decreasing Novolog correction to sensitive scale.   Thanks, Orlando Penner, RN, MSN, CCRN Diabetes Coordinator Inpatient Diabetes Program [phone]

## 2013-02-20 NOTE — Op Note (Signed)
EGD PROCEDURE REPORT  PATIENT:  [name]  MR#:  [id number] Birthdate:  07-11-1932, 79 y.o., female Endoscopist:  Dr. Malissa Hippo, MD Procedure Date: 02/20/2013  Procedure:   EGD  Indications:  Patient is 79 year old Caucasian female with multiple medical problems who presents with 3 month history of nausea vomiting and massive weight loss. Over the last few months she's undergone multiple studies including head CT chest CT abdominopelvic CT. CT suggested tiny stones. Her symptoms are not typical of gallbladder disease. She is undergoing EGD to rule out PUD or pyloric stenosis.            Informed Consent:  The risks, benefits, alternatives & imponderables which include, but are not limited to, bleeding, infection, perforation, drug reaction and potential missed lesion have been reviewed.  The potential for biopsy, lesion removal, esophageal dilation, etc. have also been discussed.  Questions have been answered.  All parties agreeable.  Please see history & physical in medical record for more information.  Medications:  Demerol 25 mg IV Versed 4 mg IV Cetacaine spray topically for oropharyngeal anesthesia  Description of procedure:  The endoscope was introduced through the mouth and advanced to the second portion of the duodenum without difficulty or limitations. The mucosal surfaces were surveyed very carefully during advancement of the scope and upon withdrawal.  Findings:  Esophagus:  Mucosa of the esophagus was normal. GE junction was unremarkable. GEJ:  35 cm Hiatus:  38 cm Stomach:  Stomach was empty and distended very well with insufflation. Stomach did not appear to be dilated. Folds in the proximal stomach were normal. Examination of mucosa at body was normal. Focal erythema noted in the prepyloric region but no erosions or ulcers identified. Pyloric channel was patent. Angularis fundus and cardia were examined by retroflex in the scope and were normal. Duodenum:  Normal  bulbar and post bulbar mucosa/  Therapeutic/Diagnostic Maneuvers Performed:  None   Complications:  None  Impression: Small sliding heart hernia without evidence of reflux esophagitis. Focal antral erythema but no evidence of peptic ulcer disease or pyloric stenosis. No abnormality noted on this exam to account for patient's symptoms.  Recommendations:  Full liquid diet. Patient can go back on warfarin. Upper abdominal ultrasound and solid phase gastric emptying study to be scheduled in a.m.  REHMAN,NAJEEB U  02/20/2013  4:32 PM  CC: Dr. [name], MD & Dr. Bonnetta Barry ref. provider found

## 2013-02-21 ENCOUNTER — Encounter (HOSPITAL_COMMUNITY): Payer: Self-pay

## 2013-02-21 ENCOUNTER — Inpatient Hospital Stay (HOSPITAL_COMMUNITY): Payer: Medicare Other

## 2013-02-21 DIAGNOSIS — N179 Acute kidney failure, unspecified: Secondary | ICD-10-CM

## 2013-02-21 LAB — GLUCOSE, CAPILLARY
Glucose-Capillary: 88 mg/dL (ref 70–99)
Glucose-Capillary: 114 mg/dL — ABNORMAL HIGH (ref 70–99)

## 2013-02-21 LAB — CBC
MCV: 84.9 fL (ref 78.0–100.0)
Platelets: 232 10*3/uL (ref 150–400)
RDW: 15 % (ref 11.5–15.5)
WBC: 6.1 10*3/uL (ref 4.0–10.5)

## 2013-02-21 LAB — BASIC METABOLIC PANEL
Calcium: 8.5 mg/dL (ref 8.4–10.5)
Chloride: 103 mEq/L (ref 96–112)
Creatinine, Ser: 0.88 mg/dL (ref 0.50–1.10)
GFR calc Af Amer: 71 mL/min — ABNORMAL LOW (ref >90)

## 2013-02-21 LAB — PROTIME-INR: Prothrombin Time: 16 seconds — ABNORMAL HIGH (ref 11.6–15.2)

## 2013-02-21 MED ORDER — WARFARIN SODIUM 5 MG PO TABS
5.0000 mg | ORAL_TABLET | Freq: Once | ORAL | Status: AC
  Administered 2013-02-21: 5 mg via ORAL
  Filled 2013-02-21: qty 1

## 2013-02-21 MED ORDER — HYDROCODONE-ACETAMINOPHEN 5-325 MG PO TABS
1.0000 | ORAL_TABLET | Freq: Four times a day (QID) | ORAL | Status: DC | PRN
  Administered 2013-02-21 – 2013-02-24 (×3): 1 via ORAL
  Filled 2013-02-21 (×3): qty 1

## 2013-02-21 MED ORDER — KCL IN DEXTROSE-NACL 40-5-0.9 MEQ/L-%-% IV SOLN
INTRAVENOUS | Status: DC
  Administered 2013-02-21 – 2013-02-23 (×2): via INTRAVENOUS
  Filled 2013-02-21 (×5): qty 1000

## 2013-02-21 MED ORDER — TECHNETIUM TC 99M SULFUR COLLOID
2.0000 | Freq: Once | INTRAVENOUS | Status: AC | PRN
  Administered 2013-02-21: 2 via ORAL

## 2013-02-21 NOTE — Progress Notes (Signed)
ANTICOAGULATION CONSULT NOTE  Pharmacy Consult for Warfarin Indication: atrial fibrillation  Allergies  Allergen Reactions  . Nsaids     Bleeding ulcer  . Codeine Nausea And Vomiting  . Adhesive (Tape) Rash    Redness, and peeling skin off.    Patient Measurements: Height: 5\' 5"  (165.1 cm) Weight: 194 lb 10.7 oz (88.3 kg) IBW/kg (Calculated) : 57  Vital Signs: Temp: 97.2 F (36.2 C) (05/23 0500) Temp src: Oral (05/23 0500) BP: 155/69 mmHg (05/23 0500) Pulse Rate: 52 (05/23 0500)  Labs:  Recent Labs  02/18/13 1535 02/19/13 0535 02/20/13 0525 02/21/13 0521  HGB 14.6  --  11.9* 11.2*  HCT 42.4  --  34.8* 33.2*  PLT 301  --  220 232  LABPROT 35.5* 36.4* 18.3* 16.0*  INR 3.84* 3.97* 1.57* 1.31  CREATININE 1.18* 1.21* 1.01 0.88  TROPONINI <0.30  --   --   --    Estimated Creatinine Clearance: 56.9 ml/min (by C-G formula based on Cr of 0.88).  Medical History: Past Medical History  Diagnosis Date  . Diabetes mellitus   . Hyperlipidemia   . Eosinophilia   . Cataract   . Hypertension   . Atrial fibrillation prior to 2008    moderate biatrial enlargement in 2009; mild to moderate LVH with a low-normal EF  . GERD (gastroesophageal reflux disease)     Hemorrhoids; history of peptic ulcer disease  . DJD (degenerative joint disease) of knee   . Sleep apnea     CPAP  . Chronic anticoagulation   . Aortic valve disease     2009: mild stenosis and regurgitation; peak gradient of 30-35 mmHg , subsequent AVR at [facility]  . Incontinence     Mild  . Carcinoma of breast     Right mastectomy; radiation and hormonal therapy  . Hip fracture, left 12/12/2012  . Foot injury 06/25/2012  . Degenerative joint disease of knee 01/29/2008    Bilateral    . BREAST CANCER, HX OF 01/29/2008    Lumpectomy, radiation therapy  Arimidex stopped in July 2013    . Syncope and collapse 11/11/2012  . Diverticulosis   . Umbilical hernia   . Cholelithiasis   . DDD (degenerative disc disease),  lumbar   . Vertebral compression fracture     lumbar  . Chronic pain   . Paresthesias     feet   Medications:  Prescriptions prior to admission  Medication Sig Dispense Refill  . albuterol (PROVENTIL HFA;VENTOLIN HFA) 108 (90 BASE) MCG/ACT inhaler Inhale 2 puffs into the lungs every 4 (four) hours as needed for wheezing.  1 Inhaler  2  . albuterol (PROVENTIL) (2.5 MG/3ML) 0.083% nebulizer solution Take 3 mLs (2.5 mg total) by nebulization every 6 (six) hours as needed for wheezing.  75 mL  3  . amLODipine (NORVASC) 5 MG tablet Take 5 mg by mouth daily.      . bisacodyl (DULCOLAX) 10 MG suppository Place 1 suppository (10 mg total) rectally daily as needed for constipation.  12 suppository  2  . bisacodyl (DULCOLAX) 5 MG EC tablet Take 5 mg by mouth daily as needed for constipation.      Marland Kitchen buPROPion (WELLBUTRIN) 75 MG tablet Take 0.5 tablets (37.5 mg total) by mouth 2 (two) times daily.  60 tablet  1  . clotrimazole-betamethasone (LOTRISONE) cream Apply 1 application topically 2 (two) times daily.      Marland Kitchen glimepiride (AMARYL) 1 MG tablet Take 1 mg by mouth daily before  breakfast.      . HYDROcodone-acetaminophen (NORCO) 5-325 MG per tablet Take 1 tablet by mouth every 6 (six) hours as needed for pain.  60 tablet  2  . levothyroxine (SYNTHROID, LEVOTHROID) 50 MCG tablet Take 50 mcg by mouth daily. To take 30 minutes prior to all other medications      . metoprolol succinate (TOPROL-XL) 25 MG 24 hr tablet Take 25 mg by mouth every morning.       Marland Kitchen omeprazole (PRILOSEC) 20 MG capsule Take 20 mg by mouth daily.      . ondansetron (ZOFRAN-ODT) 4 MG disintegrating tablet Take 4 mg by mouth every 6 (six) hours as needed for nausea.      . simvastatin (ZOCOR) 20 MG tablet Take 1 tablet (20 mg total) by mouth at bedtime.  30 tablet  6  . warfarin (COUMADIN) 5 MG tablet Take 5 mg by mouth daily.       Marland Kitchen acetaminophen (TYLENOL) 650 MG CR tablet Take 1 tablet (650 mg total) by mouth every 8 (eight) hours  as needed for pain.      . calcium carbonate (OS-CAL) 600 MG TABS Take 600 mg by mouth daily.      . diclofenac sodium (VOLTAREN) 1 % GEL Apply 2 g topically daily as needed (Pain).      Marland Kitchen lactulose (CHRONULAC) 10 GM/15ML solution Take 30 g by mouth daily as needed. Take 30ml daily for constipation as needed      . Multiple Vitamin (MULTIVITAMIN) tablet Take 1 tablet by mouth daily.      . polyethylene glycol (MIRALAX / GLYCOLAX) packet Take 17 g by mouth 2 (two) times daily.       Assessment: Patient is on chronic warfarin 5mg  daily for Afib. Elevated INR was elevated on admission.  Patient received 10mg  IV Vit K on 5/21 to reverse INR and is s/p EGD today.  OK to resume warfarin.    Goal of Therapy:  INR 2-3   Plan:  Coumadin 5mg  po x1 today Daily PT/INR.  Valrie Hart A 02/21/2013,9:01 AM

## 2013-02-21 NOTE — Progress Notes (Signed)
Patient has been keeping liquids down; she was also able to keep you labeled meal down for gastric emptying study. She denies abdominal pain. Her daughter states she is anxious and nerve pill is not working. She believes this making her symptoms worse. Abdominal exam remains benign. Gastric emptying study reveals slow emptying. 68% of the activity remains in stomach by 2 hours. Ultrasound reveals cholelithiasis, large gallbladder and prominent CBD but no choledocholithiasis. Reports suggest cirrhosis but no such changes apparent on CT from 5 weeks ago. Assessment; Patient's symptoms would appear to be multifactorial but I believe distended gallbladder with cholelithiasis is contradicting to her symptoms. She will also need intraoperative cholangiogram if decision is made to proceed with cholecystectomy. Recommendations. Surgical consultation with Dr. Lovell Sheehan in a.m. Dr. Karilyn Cota is in agreement. Advance diet to bland modified carb diet.

## 2013-02-21 NOTE — Progress Notes (Signed)
     Subjective: This lady she says she feels much improved since she came to the hospital and she has no further vomiting. She is due to have further studies today including a solid phase gastric emptying study. She already had an ultrasound of the abdomen, we are awaiting the result.           Physical Exam: Blood pressure 155/69, pulse 52, temperature 97.2 F (36.2 C), temperature source Oral, resp. rate 16, height 5\' 5"  (1.651 m), weight 88.3 kg (194 lb 10.7 oz), SpO2 92.00%. She looks systemically well. She is obese. Abdomen is soft and nontender. Heart sounds are present and normal. Lung fields are clear.   Investigations:  Recent Results (from the past 240 hour(s))  URINE CULTURE     Status: None   Collection Time    02/18/13  4:53 PM      Result Value Range Status   Specimen Description URINE, CLEAN CATCH   Final   Special Requests NONE   Final   Culture  Setup Time 02/19/2013 01:29   Final   Colony Count NO GROWTH   Final   Culture NO GROWTH   Final   Report Status 02/19/2013 FINAL   Final     Basic Metabolic Panel:  Recent Labs  09/81/19 0525 02/21/13 0521  NA 135 138  K 3.1* 3.1*  CL 100 103  CO2 25 25  GLUCOSE 68* 78  BUN 9 4*  CREATININE 1.01 0.88  CALCIUM 8.1* 8.5   Liver Function Tests:  Recent Labs  02/18/13 1535 02/19/13 0535  AST 39* 25  ALT 12 8  ALKPHOS 126* 97  BILITOT 0.4 0.4  PROT 7.7 6.1  ALBUMIN 3.9 3.1*     CBC:  Recent Labs  02/18/13 1535 02/20/13 0525 02/21/13 0521  WBC 9.9 5.5 6.1  NEUTROABS 4.1  --   --   HGB 14.6 11.9* 11.2*  HCT 42.4 34.8* 33.2*  MCV 83.3 84.1 84.9  PLT 301 220 232    No results found.    Medications: I have reviewed the patient's current medications.  Impression: 1. Chronic nausea and vomiting, unclear etiology for the last 3 months. 2. Hypertension. 3. Type 2 diabetes mellitus. 4. Chronic atrial fibrillation on chronic anticoagulation. 5. Hypothyroidism. 6. Depression. 7.  Hypokalemia.     Plan: 1. Check TSH. 2. Replete potassium. 3. Await gastric emptying study.     LOS: 3 days   Wilson Singer Pager [phone]  02/21/2013, 7:51 AM

## 2013-02-22 ENCOUNTER — Inpatient Hospital Stay (HOSPITAL_COMMUNITY): Payer: Medicare Other

## 2013-02-22 DIAGNOSIS — E669 Obesity, unspecified: Secondary | ICD-10-CM

## 2013-02-22 LAB — GLUCOSE, CAPILLARY: Glucose-Capillary: 93 mg/dL (ref 70–99)

## 2013-02-22 LAB — PROTIME-INR: Prothrombin Time: 16.7 seconds — ABNORMAL HIGH (ref 11.6–15.2)

## 2013-02-22 LAB — ACTH STIMULATION, 3 TIME POINTS: Cortisol, 30 Min: 8.5 ug/dL — ABNORMAL LOW (ref >20)

## 2013-02-22 LAB — TSH: TSH: 0.585 u[IU]/mL (ref 0.350–4.500)

## 2013-02-22 LAB — COMPREHENSIVE METABOLIC PANEL
Albumin: 2.7 g/dL — ABNORMAL LOW (ref 3.5–5.2)
BUN: 3 mg/dL — ABNORMAL LOW (ref 6–23)
Creatinine, Ser: 0.8 mg/dL (ref 0.50–1.10)
Total Protein: 5.2 g/dL — ABNORMAL LOW (ref 6.0–8.3)

## 2013-02-22 MED ORDER — POTASSIUM CHLORIDE CRYS ER 20 MEQ PO TBCR
40.0000 meq | EXTENDED_RELEASE_TABLET | Freq: Once | ORAL | Status: AC
  Administered 2013-02-22: 40 meq via ORAL

## 2013-02-22 MED ORDER — WARFARIN SODIUM 5 MG PO TABS
5.0000 mg | ORAL_TABLET | Freq: Once | ORAL | Status: AC
  Administered 2013-02-22: 5 mg via ORAL
  Filled 2013-02-22: qty 1

## 2013-02-22 NOTE — Progress Notes (Signed)
Nuc med unable to do scan until Tuesday, dr Lovell Sheehan notified

## 2013-02-22 NOTE — Consult Note (Signed)
Reason for Consult: Cholelithiasis Referring Physician: [name], Dr. Deirdre Evener Kristin Logan is an 79 y.o. female.  HPI: 79 year old white female with multiple medical problems who presents back any pain with nausea and vomiting. To summarize her history from a surgical standpoint, she was seen last month by surgery for cholelithiasis, though surgery was not recommended as the patient appeared to be asymptomatic from a cholelithiasis standpoint. In talking with the patient and family, she has never had right upper quadrant abdominal pain or fevers. She denies any history of jaundice. Since being in the hospital, she is not having much nausea or vomiting. At home, her appetite is significantly depressed.  Past Medical History  Diagnosis Date  . Diabetes mellitus   . Hyperlipidemia   . Eosinophilia   . Cataract   . Hypertension   . Atrial fibrillation prior to 2008    moderate biatrial enlargement in 2009; mild to moderate LVH with a low-normal EF  . GERD (gastroesophageal reflux disease)     Hemorrhoids; history of peptic ulcer disease  . DJD (degenerative joint disease) of knee   . Sleep apnea     CPAP  . Chronic anticoagulation   . Aortic valve disease     2009: mild stenosis and regurgitation; peak gradient of 30-35 mmHg , subsequent AVR at [facility]  . Incontinence     Mild  . Carcinoma of breast     Right mastectomy; radiation and hormonal therapy  . Hip fracture, left 12/12/2012  . Foot injury 06/25/2012  . Degenerative joint disease of knee 01/29/2008    Bilateral    . BREAST CANCER, HX OF 01/29/2008    Lumpectomy, radiation therapy  Arimidex stopped in July 2013    . Syncope and collapse 11/11/2012  . Diverticulosis   . Umbilical hernia   . Cholelithiasis   . DDD (degenerative disc disease), lumbar   . Vertebral compression fracture     lumbar  . Chronic pain   . Paresthesias     feet    Past Surgical History  Procedure Laterality Date  . Exploration post  operative open heart    . Colonoscopy      Approximately 2000  . Cardiac valve replacement      [facility]  . Cardiac valve replacement    . Breast surgery    . Breast lumpectomy      Family History  Problem Relation Age of Onset  . Heart disease Mother   . Stroke Father   . Cancer Sister   . Heart disease Sister   . Stroke Sister   . Stroke Brother     Social History:  reports that she has never smoked. She does not have any smokeless tobacco history on file. She reports that she does not drink alcohol or use illicit drugs.  Allergies:  Allergies  Allergen Reactions  . Nsaids     Bleeding ulcer  . Codeine Nausea And Vomiting  . Adhesive (Tape) Rash    Redness, and peeling skin off.     Medications: I have reviewed the patient's current medications.  Results for orders placed during the hospital encounter of 02/18/13 (from the past 48 hour(s))  GLUCOSE, CAPILLARY     Status: None   Collection Time    02/20/13 12:11 PM      Result Value Range   Glucose-Capillary 82  70 - 99 mg/dL   Comment 1 Notify RN    GLUCOSE, CAPILLARY     Status: None  Collection Time    02/20/13  3:28 PM      Result Value Range   Glucose-Capillary 84  70 - 99 mg/dL  GLUCOSE, CAPILLARY     Status: Abnormal   Collection Time    02/20/13  4:58 PM      Result Value Range   Glucose-Capillary 128 (*) 70 - 99 mg/dL   Comment 1 Notify RN    GLUCOSE, CAPILLARY     Status: None   Collection Time    02/20/13 10:07 PM      Result Value Range   Glucose-Capillary 89  70 - 99 mg/dL   Comment 1 Notify RN     Comment 2 Documented in Chart    PROTIME-INR     Status: Abnormal   Collection Time    02/21/13  5:21 AM      Result Value Range   Prothrombin Time 16.0 (*) 11.6 - 15.2 seconds   INR 1.31  0.00 - 1.49  CBC     Status: Abnormal   Collection Time    02/21/13  5:21 AM      Result Value Range   WBC 6.1  4.0 - 10.5 K/uL   RBC 3.91  3.87 - 5.11 MIL/uL   Hemoglobin 11.2 (*) 12.0 - 15.0 g/dL   HCT  40.9 (*) 81.1 - 46.0 %   MCV 84.9  78.0 - 100.0 fL   MCH 28.6  26.0 - 34.0 pg   MCHC 33.7  30.0 - 36.0 g/dL   RDW 91.4  78.2 - 95.6 %   Platelets 232  150 - 400 K/uL  BASIC METABOLIC PANEL     Status: Abnormal   Collection Time    02/21/13  5:21 AM      Result Value Range   Sodium 138  135 - 145 mEq/L   Potassium 3.1 (*) 3.5 - 5.1 mEq/L   Chloride 103  96 - 112 mEq/L   CO2 25  19 - 32 mEq/L   Glucose, Bld 78  70 - 99 mg/dL   BUN 4 (*) 6 - 23 mg/dL   Creatinine, Ser 2.13  0.50 - 1.10 mg/dL   Calcium 8.5  8.4 - 08.6 mg/dL   GFR calc non Af Amer 61 (*) >90 mL/min   GFR calc Af Amer 71 (*) >90 mL/min   Comment:            The eGFR has been calculated     using the CKD EPI equation.     This calculation has not been     validated in all clinical     situations.     eGFR's persistently     <90 mL/min signify     possible Chronic Kidney Disease.  GLUCOSE, CAPILLARY     Status: None   Collection Time    02/21/13  7:57 AM      Result Value Range   Glucose-Capillary 82  70 - 99 mg/dL   Comment 1 Notify RN    GLUCOSE, CAPILLARY     Status: None   Collection Time    02/21/13 11:27 AM      Result Value Range   Glucose-Capillary 88  70 - 99 mg/dL   Comment 1 Notify RN    GLUCOSE, CAPILLARY     Status: Abnormal   Collection Time    02/21/13  4:35 PM      Result Value Range   Glucose-Capillary 114 (*) 70 - 99 mg/dL  Comment 1 Notify RN    GLUCOSE, CAPILLARY     Status: Abnormal   Collection Time    02/21/13  8:57 PM      Result Value Range   Glucose-Capillary 114 (*) 70 - 99 mg/dL   Comment 1 Notify RN    PROTIME-INR     Status: Abnormal   Collection Time    02/22/13  6:51 AM      Result Value Range   Prothrombin Time 16.7 (*) 11.6 - 15.2 seconds   INR 1.39  0.00 - 1.49    Nm Gastric Emptying  02/21/2013   *RADIOLOGY REPORT*  Clinical Data:  Nausea vomiting.  Weight loss.  NUCLEAR MEDICINE GASTRIC EMPTYING SCAN  Technique:  After oral ingestion of radiolabeled meal,  sequential abdominal images were obtained for 120 minutes.  Residual percentage of activity remaining within the stomach was calculated at 60 and 120 minutes.  Radiopharmaceutical:  2 mCi Tc-63m sulfur colloid.  Comparison:  None.  Findings: At 60 minutes post ingestion, 89% of the ingested activity remains within the stomach.  The 68% of the ingested activity is still within the gastric lumen at 120 minutes.  IMPRESSION: Delayed gastric emptying.  Normal is considered less than 30% activity remaining in the stomach at 120 minutes.   Original Report Authenticated By: Kennith Center, M.D.   US Abdomen Limited Ruq  02/21/2013   *RADIOLOGY REPORT*  Clinical Data: Abdominal pain.  LIMITED RIGHT UPPER QUADRANT ULTRASOUND  Technique:  Complete abdominal ultrasound examination was performed including evaluation of the liver, gallbladder, bile ducts, pancreas, kidneys, spleen, IVC, and abdominal aorta.  Comparison: CT 01/14/2013  Findings: Multiple small gallstones are noted.  The largest is 6 mm.  No wall thickening or pericholecystic fluid.  No Murphy's sign.  The common bile duct is 10 mm in caliber.  The liver is diffusely heterogeneous and somewhat nodular.  It is enlarged with a vertical length of 18.6 cm.  IMPRESSION:  Cholelithiasis.  Dilated common bile duct.  Biliary obstruction is not excluded. Correlate with liver function tests as for the need for MRCP air.  Nodular and heterogeneous liver likely due to cirrhotic change. Diffuse hepatic parenchymal disease can have a similar appearance.   Original Report Authenticated By: Jolaine Click, M.D.    ROS: See chart Blood pressure 120/68, pulse 50, temperature 97.6 F (36.4 C), temperature source Oral, resp. rate 20, height 5\' 5"  (1.651 m), weight 88.3 kg (194 lb 10.7 oz), SpO2 93.00%. Physical Exam: Pleasant white female in no acute distress. Abdomen is soft, nontender, nondistended. No hepatosplenomegaly, masses, or hernias are  appreciated.  Assessment/Plan: Impression: Cholelithiasis, gastroparesis, failure to thrive, status post aortic valve replacement/stenting Plan: Patient is a high risk surgical candidate. Patient and family would like to avoid surgery. I did explain to them that in my experience, gastric emptying does not significantly improve with taking the gallbladder out for just cholelithiasis. I agree that her overall symptoms are multifactorial in nature. I wonder whether a trial of erythromycin by mouth would helped her gastric emptying. Will discuss with Dr. Karilyn Cota.  Aune Adami A 02/22/2013, 7:50 AM

## 2013-02-22 NOTE — Progress Notes (Signed)
ANTICOAGULATION CONSULT NOTE  Pharmacy Consult for Warfarin Indication: atrial fibrillation  Allergies  Allergen Reactions  . Nsaids     Bleeding ulcer  . Codeine Nausea And Vomiting  . Adhesive (Tape) Rash    Redness, and peeling skin off.    Patient Measurements: Height: 5\' 5"  (165.1 cm) Weight: 194 lb 10.7 oz (88.3 kg) IBW/kg (Calculated) : 57  Vital Signs: BP: 151/72 mmHg (05/24 0833) Pulse Rate: 52 (05/24 0833)  Labs:  Recent Labs  02/20/13 0525 02/21/13 0521 02/22/13 0651  HGB 11.9* 11.2*  --   HCT 34.8* 33.2*  --   PLT 220 232  --   LABPROT 18.3* 16.0* 16.7*  INR 1.57* 1.31 1.39  CREATININE 1.01 0.88 0.80   Estimated Creatinine Clearance: 62.6 ml/min (by C-G formula based on Cr of 0.8).  Medical History: Past Medical History  Diagnosis Date  . Diabetes mellitus   . Hyperlipidemia   . Eosinophilia   . Cataract   . Hypertension   . Atrial fibrillation prior to 2008    moderate biatrial enlargement in 2009; mild to moderate LVH with a low-normal EF  . GERD (gastroesophageal reflux disease)     Hemorrhoids; history of peptic ulcer disease  . DJD (degenerative joint disease) of knee   . Sleep apnea     CPAP  . Chronic anticoagulation   . Aortic valve disease     2009: mild stenosis and regurgitation; peak gradient of 30-35 mmHg , subsequent AVR at [facility]  . Incontinence     Mild  . Carcinoma of breast     Right mastectomy; radiation and hormonal therapy  . Hip fracture, left 12/12/2012  . Foot injury 06/25/2012  . Degenerative joint disease of knee 01/29/2008    Bilateral    . BREAST CANCER, HX OF 01/29/2008    Lumpectomy, radiation therapy  Arimidex stopped in July 2013    . Syncope and collapse 11/11/2012  . Diverticulosis   . Umbilical hernia   . Cholelithiasis   . DDD (degenerative disc disease), lumbar   . Vertebral compression fracture     lumbar  . Chronic pain   . Paresthesias     feet   Medications:  Prescriptions prior to admission   Medication Sig Dispense Refill  . albuterol (PROVENTIL HFA;VENTOLIN HFA) 108 (90 BASE) MCG/ACT inhaler Inhale 2 puffs into the lungs every 4 (four) hours as needed for wheezing.  1 Inhaler  2  . albuterol (PROVENTIL) (2.5 MG/3ML) 0.083% nebulizer solution Take 3 mLs (2.5 mg total) by nebulization every 6 (six) hours as needed for wheezing.  75 mL  3  . amLODipine (NORVASC) 5 MG tablet Take 5 mg by mouth daily.      . bisacodyl (DULCOLAX) 10 MG suppository Place 1 suppository (10 mg total) rectally daily as needed for constipation.  12 suppository  2  . bisacodyl (DULCOLAX) 5 MG EC tablet Take 5 mg by mouth daily as needed for constipation.      Marland Kitchen buPROPion (WELLBUTRIN) 75 MG tablet Take 0.5 tablets (37.5 mg total) by mouth 2 (two) times daily.  60 tablet  1  . clotrimazole-betamethasone (LOTRISONE) cream Apply 1 application topically 2 (two) times daily.      Marland Kitchen glimepiride (AMARYL) 1 MG tablet Take 1 mg by mouth daily before breakfast.      . HYDROcodone-acetaminophen (NORCO) 5-325 MG per tablet Take 1 tablet by mouth every 6 (six) hours as needed for pain.  60 tablet  2  .  levothyroxine (SYNTHROID, LEVOTHROID) 50 MCG tablet Take 50 mcg by mouth daily. To take 30 minutes prior to all other medications      . metoprolol succinate (TOPROL-XL) 25 MG 24 hr tablet Take 25 mg by mouth every morning.       Marland Kitchen omeprazole (PRILOSEC) 20 MG capsule Take 20 mg by mouth daily.      . ondansetron (ZOFRAN-ODT) 4 MG disintegrating tablet Take 4 mg by mouth every 6 (six) hours as needed for nausea.      . simvastatin (ZOCOR) 20 MG tablet Take 1 tablet (20 mg total) by mouth at bedtime.  30 tablet  6  . warfarin (COUMADIN) 5 MG tablet Take 5 mg by mouth daily.       Marland Kitchen acetaminophen (TYLENOL) 650 MG CR tablet Take 1 tablet (650 mg total) by mouth every 8 (eight) hours as needed for pain.      . calcium carbonate (OS-CAL) 600 MG TABS Take 600 mg by mouth daily.      . diclofenac sodium (VOLTAREN) 1 % GEL Apply 2 g  topically daily as needed (Pain).      Marland Kitchen lactulose (CHRONULAC) 10 GM/15ML solution Take 30 g by mouth daily as needed. Take 30ml daily for constipation as needed      . Multiple Vitamin (MULTIVITAMIN) tablet Take 1 tablet by mouth daily.      . polyethylene glycol (MIRALAX / GLYCOLAX) packet Take 17 g by mouth 2 (two) times daily.       Assessment: Patient is on chronic warfarin 5mg  daily for Afib. INR was elevated on admission.  Patient received 10mg  IV Vit K on 5/21 to reverse INR and is s/p EGD.  OK to resume warfarin.    Goal of Therapy:  INR 2-3   Plan:  Coumadin 5mg  po x1 today Daily PT/INR.  Margo Aye, Nilesh Stegall A 02/22/2013,10:12 AM

## 2013-02-22 NOTE — Progress Notes (Signed)
Subjective: This lady she says she feels much improved since she came to the hospital and she has no further vomiting. Gastric emptying study showed delayed gastric emptying. The patient admits that she does better when she has smaller more frequent meals.           Physical Exam: Blood pressure 120/68, pulse 50, temperature 97.6 F (36.4 C), temperature source Oral, resp. rate 20, height 5\' 5"  (1.651 m), weight 88.3 kg (194 lb 10.7 oz), SpO2 93.00%. She looks systemically well. She is obese. Abdomen is soft and nontender. Heart sounds are present and normal. Lung fields are clear.   Investigations:  Recent Results (from the past 240 hour(s))  URINE CULTURE     Status: None   Collection Time    02/18/13  4:53 PM      Result Value Range Status   Specimen Description URINE, CLEAN CATCH   Final   Special Requests NONE   Final   Culture  Setup Time 02/19/2013 01:29   Final   Colony Count NO GROWTH   Final   Culture NO GROWTH   Final   Report Status 02/19/2013 FINAL   Final     Basic Metabolic Panel:  Recent Labs  04/54/09 0525 02/21/13 0521  NA 135 138  K 3.1* 3.1*  CL 100 103  CO2 25 25  GLUCOSE 68* 78  BUN 9 4*  CREATININE 1.01 0.88  CALCIUM 8.1* 8.5      CBC:  Recent Labs  02/20/13 0525 02/21/13 0521  WBC 5.5 6.1  HGB 11.9* 11.2*  HCT 34.8* 33.2*  MCV 84.1 84.9  PLT 220 232    Nm Gastric Emptying  02/21/2013   *RADIOLOGY REPORT*  Clinical Data:  Nausea vomiting.  Weight loss.  NUCLEAR MEDICINE GASTRIC EMPTYING SCAN  Technique:  After oral ingestion of radiolabeled meal, sequential abdominal images were obtained for 120 minutes.  Residual percentage of activity remaining within the stomach was calculated at 60 and 120 minutes.  Radiopharmaceutical:  2 mCi Tc-32m sulfur colloid.  Comparison:  None.  Findings: At 60 minutes post ingestion, 89% of the ingested activity remains within the stomach.  The 68% of the ingested activity is still within the  gastric lumen at 120 minutes.  IMPRESSION: Delayed gastric emptying.  Normal is considered less than 30% activity remaining in the stomach at 120 minutes.   Original Report Authenticated By: Kennith Center, M.D.   US Abdomen Limited Ruq  02/21/2013   *RADIOLOGY REPORT*  Clinical Data: Abdominal pain.  LIMITED RIGHT UPPER QUADRANT ULTRASOUND  Technique:  Complete abdominal ultrasound examination was performed including evaluation of the liver, gallbladder, bile ducts, pancreas, kidneys, spleen, IVC, and abdominal aorta.  Comparison: CT 01/14/2013  Findings: Multiple small gallstones are noted.  The largest is 6 mm.  No wall thickening or pericholecystic fluid.  No Murphy's sign.  The common bile duct is 10 mm in caliber.  The liver is diffusely heterogeneous and somewhat nodular.  It is enlarged with a vertical length of 18.6 cm.  IMPRESSION:  Cholelithiasis.  Dilated common bile duct.  Biliary obstruction is not excluded. Correlate with liver function tests as for the need for MRCP air.  Nodular and heterogeneous liver likely due to cirrhotic change. Diffuse hepatic parenchymal disease can have a similar appearance.   Original Report Authenticated By: Jolaine Click, M.D.      Medications: I have reviewed the patient's current medications.  Impression: 1. Chronic nausea and vomiting, unclear  etiology for the last 3 months. 2. Hypertension. 3. Type 2 diabetes mellitus. 4. Chronic atrial fibrillation on chronic anticoagulation. 5. Hypothyroidism. 6. Depression. 7.Delayed gastric emptying.     Plan: 1. Encourage smaller meals more frequently. 2. Dr. Lovell Sheehan will speak with Dr. Karilyn Cota regarding further management.     LOS: 4 days   Wilson Singer Pager [phone]  02/22/2013, 7:56 AM

## 2013-02-22 NOTE — Progress Notes (Signed)
Patient feels better today. She has not experienced nausea and/or vomiting. She had more than half of her breakfast and kept it down. She denies abdominal pain. Dr. Lovell Sheehan note appreciated. I agree patient is very high risk for surgical intervention. Furthermore her symptoms may be multifactorial. If her gallbladder does not visualize on HIDA scan I believe that what help in decision-making. Condition discussed at length with two of her children, daughter-in-law and husband. I also asked her daughter to try to get domperidone for her mother as she is not a candidate for therapy with metoclopramide. HIDA scan has been ordered by Dr. Lovell Sheehan.

## 2013-02-23 ENCOUNTER — Inpatient Hospital Stay (HOSPITAL_COMMUNITY): Payer: Medicare Other

## 2013-02-23 LAB — CORTISOL-PM, BLOOD: Cortisol - PM: 2.8 ug/dL — ABNORMAL LOW (ref 3.1–16.7)

## 2013-02-23 LAB — BASIC METABOLIC PANEL
BUN: 4 mg/dL — ABNORMAL LOW (ref 6–23)
Creatinine, Ser: 0.78 mg/dL (ref 0.50–1.10)
GFR calc Af Amer: 90 mL/min — ABNORMAL LOW (ref >90)
GFR calc non Af Amer: 77 mL/min — ABNORMAL LOW (ref >90)
Glucose, Bld: 81 mg/dL (ref 70–99)
Potassium: 3.9 mEq/L (ref 3.5–5.1)

## 2013-02-23 LAB — GLUCOSE, CAPILLARY
Glucose-Capillary: 71 mg/dL (ref 70–99)
Glucose-Capillary: 106 mg/dL — ABNORMAL HIGH (ref 70–99)

## 2013-02-23 LAB — PROTIME-INR: Prothrombin Time: 14.8 seconds (ref 11.6–15.2)

## 2013-02-23 MED ORDER — WARFARIN SODIUM 10 MG PO TABS
10.0000 mg | ORAL_TABLET | Freq: Once | ORAL | Status: AC
  Administered 2013-02-23: 10 mg via ORAL
  Filled 2013-02-23: qty 1

## 2013-02-23 NOTE — Progress Notes (Signed)
HIDA scan will not be able to be performed until Tuesday, 02/25/2013.

## 2013-02-23 NOTE — Progress Notes (Signed)
Patient and family requesting a wheelchair so patient can leave floor and get some fresh air.  Patient is stable and has several family members willing to go with her.  Pt up in wheelchair with standby assist. Gave patient's son my phone number incase they went off the floor and needed me.

## 2013-02-23 NOTE — Progress Notes (Signed)
Kristin Logan:[id number] DOB: [birth year] DOA: 02/18/2013 PCP: [name], MD   Subjective: This lady is feeling better. She was able to tolerate a diet yesterday without nausea or vomiting. Unfortunately, HIDA scan cannot be done today, or probably tomorrow.           Physical Exam: Blood pressure 146/83, pulse 68, temperature 97.8 F (36.6 C), temperature source Oral, resp. rate 20, height 5\' 5"  (1.651 m), weight 88.3 kg (194 lb 10.7 oz), SpO2 95.00%. She looks systemically well. Heart sounds are present and normal. Lung fields are clear. Abdomen is soft and nontender. She is alert and oriented.   Investigations:  Recent Results (from the past 240 hour(s))  URINE CULTURE     Status: None   Collection Time    02/18/13  4:53 PM      Result Value Range Status   Specimen Description URINE, CLEAN CATCH   Final   Special Requests NONE   Final   Culture  Setup Time 02/19/2013 01:29   Final   Colony Count NO GROWTH   Final   Culture NO GROWTH   Final   Report Status 02/19/2013 FINAL   Final     Basic Metabolic Panel:  Recent Labs  81/19/14 0651 02/23/13 0633  NA 136 138  K 3.4* 3.9  CL 101 106  CO2 25 26  GLUCOSE 78 81  BUN 3* 4*  CREATININE 0.80 0.78  CALCIUM 8.3* 8.3*   Liver Function Tests:  Recent Labs  02/22/13 0651  AST 22  ALT 6  ALKPHOS 89  BILITOT 0.6  PROT 5.2*  ALBUMIN 2.7*     CBC:  Recent Labs  02/21/13 0521  WBC 6.1  HGB 11.2*  HCT 33.2*  MCV 84.9  PLT 232    Nm Gastric Emptying  02/21/2013   *RADIOLOGY REPORT*  Clinical Data:  Nausea vomiting.  Weight loss.  NUCLEAR MEDICINE GASTRIC EMPTYING SCAN  Technique:  After oral ingestion of radiolabeled meal, sequential abdominal images were obtained for 120 minutes.  Residual percentage of activity remaining within the stomach was calculated at 60 and 120 minutes.  Radiopharmaceutical:  2 mCi Tc-62m sulfur colloid.  Comparison:  None.  Findings: At 60 minutes post ingestion,  89% of the ingested activity remains within the stomach.  The 68% of the ingested activity is still within the gastric lumen at 120 minutes.  IMPRESSION: Delayed gastric emptying.  Normal is considered less than 30% activity remaining in the stomach at 120 minutes.   Original Report Authenticated By: Kennith Center, M.D.      Medications: I have reviewed the patient's current medications.  Impression: 1. Chronic nausea and vomiting, for the last 3 months, etiology is probably multifactorial. She has delayed gastric emptying but also could have gallbladder problems. 2. Hypertension. 3. Type 2 diabetes mellitus. 4. Chronic atrial fibrillation on chronic anticoagulation. 5. Hypothyroidism. 6. Depression.      Plan: 1. Reduce IV fluids to [facility] now and encourage oral intake and small frequent meals. 2. Plan for HIDA scan soon.  Consultants:  Gastroenterology, Dr. Karilyn Cota.  Surgery, Dr. Lovell Sheehan.   Procedures:  None.   Antibiotics:  None.                   Code Status: Full code.  Family Communication: Discussed plan with patient and family at the bedside.   Disposition Plan: Home when medically stable.  Time spent: 20 minutes.   LOS: 5 days   Dmitry Macomber C  Pager [phone]  02/23/2013, 10:01 AM

## 2013-02-23 NOTE — Progress Notes (Signed)
Subjective; Patient has no complaints. She is eating half to one third at each mealtime. She wants to eat tacos. She denies nausea vomiting or abdominal pain. Objective; BP 146/83  Pulse 68  Temp(Src) 97.8 F (36.6 C) (Oral)  Resp 20  Ht 5\' 5"  (1.651 m)  Wt 194 lb 10.7 oz (88.3 kg)  BMI 32.39 kg/m2  SpO2 95% Abdomen remains soft and nontender without organomegaly or masses. Lab data; Baseline cortisol level is 3.6. Poststimulation is 8.5 and 12.3 Assessment; Recurrent nausea vomiting associated with massive weight loss. Workup so far reveals gastroparesis he is cholelithiasis. Dr. Lovell Sheehan has evaluated patient and HIDA scan is pending. Results of CT exam tests reviewed along with Dr. Karilyn Cota. Baseline cortisol level is low but she did mount a response. He is checking 4 PM cortisol. Recommendations; Await results of HIDA scan. Patient encouraged to increase by mouth intake.

## 2013-02-23 NOTE — Progress Notes (Signed)
Spoke with Nuc Med tech, will be able to do scan in am. NPO after midnight and no pain medication after midnight

## 2013-02-23 NOTE — Progress Notes (Signed)
ANTICOAGULATION CONSULT NOTE  Pharmacy Consult for Warfarin Indication: atrial fibrillation  Allergies  Allergen Reactions  . Nsaids     Bleeding ulcer  . Codeine Nausea And Vomiting  . Adhesive (Tape) Rash    Redness, and peeling skin off.    Patient Measurements: Height: 5\' 5"  (165.1 cm) Weight: 194 lb 10.7 oz (88.3 kg) IBW/kg (Calculated) : 57  Vital Signs: Temp: 97.8 F (36.6 C) (05/25 0640) Temp src: Oral (05/24 2139) BP: 146/83 mmHg (05/25 0640) Pulse Rate: 68 (05/25 0640)  Labs:  Recent Labs  02/21/13 0521 02/22/13 0651 02/23/13 0633  HGB 11.2*  --   --   HCT 33.2*  --   --   PLT 232  --   --   LABPROT 16.0* 16.7* 14.8  INR 1.31 1.39 1.18  CREATININE 0.88 0.80 0.78   Estimated Creatinine Clearance: 62.6 ml/min (by C-G formula based on Cr of 0.78).  Medical History: Past Medical History  Diagnosis Date  . Diabetes mellitus   . Hyperlipidemia   . Eosinophilia   . Cataract   . Hypertension   . Atrial fibrillation prior to 2008    moderate biatrial enlargement in 2009; mild to moderate LVH with a low-normal EF  . GERD (gastroesophageal reflux disease)     Hemorrhoids; history of peptic ulcer disease  . DJD (degenerative joint disease) of knee   . Sleep apnea     CPAP  . Chronic anticoagulation   . Aortic valve disease     2009: mild stenosis and regurgitation; peak gradient of 30-35 mmHg , subsequent AVR at [facility]  . Incontinence     Mild  . Carcinoma of breast     Right mastectomy; radiation and hormonal therapy  . Hip fracture, left 12/12/2012  . Foot injury 06/25/2012  . Degenerative joint disease of knee 01/29/2008    Bilateral    . BREAST CANCER, HX OF 01/29/2008    Lumpectomy, radiation therapy  Arimidex stopped in July 2013    . Syncope and collapse 11/11/2012  . Diverticulosis   . Umbilical hernia   . Cholelithiasis   . DDD (degenerative disc disease), lumbar   . Vertebral compression fracture     lumbar  . Chronic pain   . Paresthesias      feet   Medications:  Prescriptions prior to admission  Medication Sig Dispense Refill  . albuterol (PROVENTIL HFA;VENTOLIN HFA) 108 (90 BASE) MCG/ACT inhaler Inhale 2 puffs into the lungs every 4 (four) hours as needed for wheezing.  1 Inhaler  2  . albuterol (PROVENTIL) (2.5 MG/3ML) 0.083% nebulizer solution Take 3 mLs (2.5 mg total) by nebulization every 6 (six) hours as needed for wheezing.  75 mL  3  . amLODipine (NORVASC) 5 MG tablet Take 5 mg by mouth daily.      . bisacodyl (DULCOLAX) 10 MG suppository Place 1 suppository (10 mg total) rectally daily as needed for constipation.  12 suppository  2  . bisacodyl (DULCOLAX) 5 MG EC tablet Take 5 mg by mouth daily as needed for constipation.      Marland Kitchen buPROPion (WELLBUTRIN) 75 MG tablet Take 0.5 tablets (37.5 mg total) by mouth 2 (two) times daily.  60 tablet  1  . clotrimazole-betamethasone (LOTRISONE) cream Apply 1 application topically 2 (two) times daily.      Marland Kitchen glimepiride (AMARYL) 1 MG tablet Take 1 mg by mouth daily before breakfast.      . HYDROcodone-acetaminophen (NORCO) 5-325 MG per tablet Take  1 tablet by mouth every 6 (six) hours as needed for pain.  60 tablet  2  . levothyroxine (SYNTHROID, LEVOTHROID) 50 MCG tablet Take 50 mcg by mouth daily. To take 30 minutes prior to all other medications      . metoprolol succinate (TOPROL-XL) 25 MG 24 hr tablet Take 25 mg by mouth every morning.       Marland Kitchen omeprazole (PRILOSEC) 20 MG capsule Take 20 mg by mouth daily.      . ondansetron (ZOFRAN-ODT) 4 MG disintegrating tablet Take 4 mg by mouth every 6 (six) hours as needed for nausea.      . simvastatin (ZOCOR) 20 MG tablet Take 1 tablet (20 mg total) by mouth at bedtime.  30 tablet  6  . warfarin (COUMADIN) 5 MG tablet Take 5 mg by mouth daily.       Marland Kitchen acetaminophen (TYLENOL) 650 MG CR tablet Take 1 tablet (650 mg total) by mouth every 8 (eight) hours as needed for pain.      . calcium carbonate (OS-CAL) 600 MG TABS Take 600 mg by mouth  daily.      . diclofenac sodium (VOLTAREN) 1 % GEL Apply 2 g topically daily as needed (Pain).      Marland Kitchen lactulose (CHRONULAC) 10 GM/15ML solution Take 30 g by mouth daily as needed. Take 30ml daily for constipation as needed      . Multiple Vitamin (MULTIVITAMIN) tablet Take 1 tablet by mouth daily.      . polyethylene glycol (MIRALAX / GLYCOLAX) packet Take 17 g by mouth 2 (two) times daily.       Assessment: Patient is on chronic warfarin 5mg  daily for Afib. INR was elevated on admission.  Patient received 10mg  IV Vit K on 5/21 to reverse INR and is s/p EGD.  OK to resume warfarin after EGD per MD.  Sluggish INR response after 2 doses of Coumadin (5mg  each) most likely due to effects of Vitamin K administration.  Goal of Therapy:  INR 2-3   Plan:  Coumadin 10mg  po x1 today to boost INR Daily PT/INR.  Valrie Hart A 02/23/2013,9:05 AM

## 2013-02-24 ENCOUNTER — Encounter (HOSPITAL_COMMUNITY): Payer: Self-pay

## 2013-02-24 ENCOUNTER — Inpatient Hospital Stay (HOSPITAL_COMMUNITY): Payer: Medicare Other

## 2013-02-24 DIAGNOSIS — E871 Hypo-osmolality and hyponatremia: Secondary | ICD-10-CM

## 2013-02-24 LAB — GLUCOSE, CAPILLARY
Glucose-Capillary: 74 mg/dL (ref 70–99)
Glucose-Capillary: 122 mg/dL — ABNORMAL HIGH (ref 70–99)

## 2013-02-24 LAB — BASIC METABOLIC PANEL
BUN: 4 mg/dL — ABNORMAL LOW (ref 6–23)
CO2: 28 mEq/L (ref 19–32)
Calcium: 8.7 mg/dL (ref 8.4–10.5)
GFR calc non Af Amer: 61 mL/min — ABNORMAL LOW (ref >90)
Glucose, Bld: 83 mg/dL (ref 70–99)
Potassium: 3.7 mEq/L (ref 3.5–5.1)
Sodium: 134 mEq/L — ABNORMAL LOW (ref 135–145)

## 2013-02-24 MED ORDER — SINCALIDE 5 MCG IJ SOLR
0.0200 ug/kg | Freq: Once | INTRAMUSCULAR | Status: DC
  Administered 2013-02-24: 1.8 ug via INTRAVENOUS

## 2013-02-24 MED ORDER — TECHNETIUM TC 99M MEBROFENIN IV KIT
5.0000 | PACK | Freq: Once | INTRAVENOUS | Status: AC | PRN
  Administered 2013-02-24: 5 via INTRAVENOUS

## 2013-02-24 MED ORDER — COSYNTROPIN 0.25 MG IJ SOLR
0.2500 mg | Freq: Once | INTRAMUSCULAR | Status: AC
  Administered 2013-02-25: 0.25 mg via INTRAVENOUS
  Filled 2013-02-24: qty 0.25

## 2013-02-24 MED ORDER — WARFARIN SODIUM 10 MG PO TABS
10.0000 mg | ORAL_TABLET | Freq: Once | ORAL | Status: AC
  Administered 2013-02-24: 10 mg via ORAL
  Filled 2013-02-24: qty 1

## 2013-02-24 MED ORDER — DOCUSATE SODIUM 100 MG PO CAPS
100.0000 mg | ORAL_CAPSULE | Freq: Two times a day (BID) | ORAL | Status: DC
  Administered 2013-02-24 – 2013-02-25 (×3): 100 mg via ORAL
  Filled 2013-02-24 (×3): qty 1

## 2013-02-24 NOTE — Progress Notes (Addendum)
ACTH stimulation was unable to be completed today. Pt was down in radiology getting HIDA scan and RN was unable to give medication due to medication not being available to RN before pt was brought down to for HIDA scan. Lab called to say the baseline and stimulation will need to be redone tomorrow morning because of the delay between time baseline was drawn and time pt spent in radiology. MD made aware.

## 2013-02-24 NOTE — Progress Notes (Addendum)
CRITICAL VALUE ALERT  Critical value received:  CBG 68  Date of notification:  5/26  Time of notification:  1145  Critical value read back:yes  Nurse who received alert:  Sammuel Bailiff    MD notified (1st page):  N/A  Time of first page:  N/A   Gave pt some orange Juice will recheck CBG in 15 mins  1200: Recheck CBG 93  MD notified (2nd page):  Time of second page:  Responding MD:  N/A  Time MD responded:  N/A

## 2013-02-24 NOTE — Progress Notes (Signed)
Patient has no complaints. She is eating some at each meal but less than 50%. She denies nausea. No vomiting reported in over 3 days. HIDA scan normal with EF of almost 54%. Patient's condition reviewed with 3 family members. [place] She needs to be on gastroparesis diet. Domperidone dose would be 10 mg before breakfast and before evening meal. Will arrange for office visit in one month. Patient also begun on Colace 100 mg by mouth twice a day for constipation.

## 2013-02-24 NOTE — Progress Notes (Signed)
  Kristin Logan ZOX:[id number] DOB: [birth year] DOA: 02/18/2013 PCP: [name], MD   Subjective: This lady is feeling better. She was able to tolerate a diet yesterday without nausea or vomiting. She also mobilized yesterday. Fortunately, she will be able to have a HIDA scan this morning.           Physical Exam: Blood pressure 148/75, pulse 63, temperature 97.7 F (36.5 C), temperature source Oral, resp. rate 20, height 5\' 5"  (1.651 m), weight 88.3 kg (194 lb 10.7 oz), SpO2 97.00%. She looks systemically well. Heart sounds are present and normal. Lung fields are clear. Abdomen is soft and nontender. She is alert and oriented.   Investigations:  Recent Results (from the past 240 hour(s))  URINE CULTURE     Status: None   Collection Time    02/18/13  4:53 PM      Result Value Range Status   Specimen Description URINE, CLEAN CATCH   Final   Special Requests NONE   Final   Culture  Setup Time 02/19/2013 01:29   Final   Colony Count NO GROWTH   Final   Culture NO GROWTH   Final   Report Status 02/19/2013 FINAL   Final     Basic Metabolic Panel:  Recent Labs  81/19/14 0633 02/24/13 0645  NA 138 134*  K 3.9 3.7  CL 106 100  CO2 26 28  GLUCOSE 81 83  BUN 4* 4*  CREATININE 0.78 0.88  CALCIUM 8.3* 8.7   Liver Function Tests:  Recent Labs  02/22/13 0651  AST 22  ALT 6  ALKPHOS 89  BILITOT 0.6  PROT 5.2*  ALBUMIN 2.7*            Medications: I have reviewed the patient's current medications.  Impression: 1. Chronic nausea and vomiting, for the last 3 months, etiology is probably multifactorial. She has delayed gastric emptying but also could have gallbladder problems. 2. Hypertension. 3. Type 2 diabetes mellitus. 4. Chronic atrial fibrillation on chronic anticoagulation. 5. Hypothyroidism. 6. Depression. 7. Possible adrenal insufficiency. Her afternoon cortisol also was reduced. We are repeating an ACTH stimulation test this  morning.      Plan: 1. Continue with current therapy. Repeat ACTH stimulation test this morning. 2. Plan for HIDA scan soon today.  Consultants:  Gastroenterology, Dr. Karilyn Cota.  Surgery, Dr. Lovell Sheehan.   Procedures:  None.   Antibiotics:  None.                   Code Status: Full code.  Family Communication: Discussed plan with patient and family at the bedside.   Disposition Plan: Home when medically stable.  Time spent: 20 minutes.   LOS: 6 days   Wilson Singer Pager [phone]  02/24/2013, 8:41 AM

## 2013-02-24 NOTE — Progress Notes (Signed)
ANTICOAGULATION CONSULT NOTE  Pharmacy Consult for Warfarin Indication: atrial fibrillation  Allergies  Allergen Reactions  . Nsaids     Bleeding ulcer  . Codeine Nausea And Vomiting  . Adhesive (Tape) Rash    Redness, and peeling skin off.    Patient Measurements: Height: 5\' 5"  (165.1 cm) Weight: 194 lb 10.7 oz (88.3 kg) IBW/kg (Calculated) : 57  Vital Signs: Temp: 97.7 F (36.5 C) (05/26 0600) Temp src: Oral (05/26 0600) BP: 148/75 mmHg (05/26 0600) Pulse Rate: 63 (05/26 0600)  Labs:  Recent Labs  02/22/13 0651 02/23/13 0633 02/24/13 0645  LABPROT 16.7* 14.8 14.9  INR 1.39 1.18 1.19  CREATININE 0.80 0.78 0.88   Estimated Creatinine Clearance: 56.9 ml/min (by C-G formula based on Cr of 0.88).  Medical History: Past Medical History  Diagnosis Date  . Diabetes mellitus   . Hyperlipidemia   . Eosinophilia   . Cataract   . Hypertension   . Atrial fibrillation prior to 2008    moderate biatrial enlargement in 2009; mild to moderate LVH with a low-normal EF  . GERD (gastroesophageal reflux disease)     Hemorrhoids; history of peptic ulcer disease  . DJD (degenerative joint disease) of knee   . Sleep apnea     CPAP  . Chronic anticoagulation   . Aortic valve disease     2009: mild stenosis and regurgitation; peak gradient of 30-35 mmHg , subsequent AVR at [facility]  . Incontinence     Mild  . Carcinoma of breast     Right mastectomy; radiation and hormonal therapy  . Hip fracture, left 12/12/2012  . Foot injury 06/25/2012  . Degenerative joint disease of knee 01/29/2008    Bilateral    . BREAST CANCER, HX OF 01/29/2008    Lumpectomy, radiation therapy  Arimidex stopped in July 2013    . Syncope and collapse 11/11/2012  . Diverticulosis   . Umbilical hernia   . Cholelithiasis   . DDD (degenerative disc disease), lumbar   . Vertebral compression fracture     lumbar  . Chronic pain   . Paresthesias     feet   Medications:  Prescriptions prior to admission   Medication Sig Dispense Refill  . albuterol (PROVENTIL HFA;VENTOLIN HFA) 108 (90 BASE) MCG/ACT inhaler Inhale 2 puffs into the lungs every 4 (four) hours as needed for wheezing.  1 Inhaler  2  . albuterol (PROVENTIL) (2.5 MG/3ML) 0.083% nebulizer solution Take 3 mLs (2.5 mg total) by nebulization every 6 (six) hours as needed for wheezing.  75 mL  3  . amLODipine (NORVASC) 5 MG tablet Take 5 mg by mouth daily.      . bisacodyl (DULCOLAX) 10 MG suppository Place 1 suppository (10 mg total) rectally daily as needed for constipation.  12 suppository  2  . bisacodyl (DULCOLAX) 5 MG EC tablet Take 5 mg by mouth daily as needed for constipation.      Marland Kitchen buPROPion (WELLBUTRIN) 75 MG tablet Take 0.5 tablets (37.5 mg total) by mouth 2 (two) times daily.  60 tablet  1  . clotrimazole-betamethasone (LOTRISONE) cream Apply 1 application topically 2 (two) times daily.      Marland Kitchen glimepiride (AMARYL) 1 MG tablet Take 1 mg by mouth daily before breakfast.      . HYDROcodone-acetaminophen (NORCO) 5-325 MG per tablet Take 1 tablet by mouth every 6 (six) hours as needed for pain.  60 tablet  2  . levothyroxine (SYNTHROID, LEVOTHROID) 50 MCG tablet Take 50  mcg by mouth daily. To take 30 minutes prior to all other medications      . metoprolol succinate (TOPROL-XL) 25 MG 24 hr tablet Take 25 mg by mouth every morning.       Marland Kitchen omeprazole (PRILOSEC) 20 MG capsule Take 20 mg by mouth daily.      . ondansetron (ZOFRAN-ODT) 4 MG disintegrating tablet Take 4 mg by mouth every 6 (six) hours as needed for nausea.      . simvastatin (ZOCOR) 20 MG tablet Take 1 tablet (20 mg total) by mouth at bedtime.  30 tablet  6  . warfarin (COUMADIN) 5 MG tablet Take 5 mg by mouth daily.       Marland Kitchen acetaminophen (TYLENOL) 650 MG CR tablet Take 1 tablet (650 mg total) by mouth every 8 (eight) hours as needed for pain.      . calcium carbonate (OS-CAL) 600 MG TABS Take 600 mg by mouth daily.      . diclofenac sodium (VOLTAREN) 1 % GEL Apply 2 g  topically daily as needed (Pain).      Marland Kitchen lactulose (CHRONULAC) 10 GM/15ML solution Take 30 g by mouth daily as needed. Take 30ml daily for constipation as needed      . Multiple Vitamin (MULTIVITAMIN) tablet Take 1 tablet by mouth daily.      . polyethylene glycol (MIRALAX / GLYCOLAX) packet Take 17 g by mouth 2 (two) times daily.       Assessment: PTA Patient was on chronic warfarin 5mg  daily for Afib. INR was elevated on admission.  Patient received 10mg  IV Vit K on 5/21 to reverse INR and is s/p EGD.  OK to resume warfarin after EGD per MD.  Sluggish INR response after 3 doses of Coumadin (5mg , 5mg , 10mg ) most likely due to effects of Vitamin K administration.  Goal of Therapy:  INR 2-3   Plan:  Repeat Coumadin 10mg  po x1 today to boost INR Daily PT/INR.  Margo Aye, Alessandra Sawdey A 02/24/2013,7:51 AM

## 2013-02-25 ENCOUNTER — Ambulatory Visit: Payer: Medicare Other | Admitting: Orthopedic Surgery

## 2013-02-25 ENCOUNTER — Encounter (HOSPITAL_COMMUNITY): Payer: Self-pay | Admitting: Internal Medicine

## 2013-02-25 LAB — GLUCOSE, CAPILLARY: Glucose-Capillary: 83 mg/dL (ref 70–99)

## 2013-02-25 MED ORDER — PANTOPRAZOLE SODIUM 40 MG PO TBEC
40.0000 mg | DELAYED_RELEASE_TABLET | Freq: Every day | ORAL | Status: DC
  Administered 2013-02-25: 40 mg via ORAL
  Filled 2013-02-25: qty 1

## 2013-02-25 MED ORDER — WARFARIN SODIUM 10 MG PO TABS: 10.0000 mg | ORAL_TABLET | Freq: Once | ORAL | Status: DC

## 2013-02-25 NOTE — Progress Notes (Signed)
HIDA scan has a normal ejection fraction. With this result, I doubt cholecystectomy would help her at this point. This was discussed with the patient's family. We'll sign off.

## 2013-02-25 NOTE — Progress Notes (Signed)
Patient discharged this am with instructions given on medications,and follow up visits,patient ,and family verbalized understanding.Prescriptions sent with patient.No c/o pain or discomfort noted.Vital signs stable.Accompanied by staff to an awaiting vehicle.

## 2013-02-25 NOTE — Discharge Summary (Signed)
Physician Discharge Summary  Kristin Logan AVW:[id number] DOB: 08-21-1932 DOA: 02/18/2013  PCP: Kristin Antis, MD  Admit date: 02/18/2013 Discharge date: 02/25/2013  Time spent: Greater than 30 minutes  Recommendations for Outpatient Follow-up:  1. Followup with gastroenterology, Kristin Logan in one month.  Discharge Diagnoses:  1. Chronic nausea and vomiting for the previous 3 months, multifactorial. Probable delayed gastric emptying/gastroparesis. 2. Hypertension. 3. Type 2 diabetes mellitus. 4. Chronic atrial fibrillation on chronic anticoagulation. 5. Hypothyroidism. 6. Depression. 7. Possible adrenal insufficiency, ACTH stimulation test is not confirmatory.  Discharge Condition: Stable and improved.  Diet recommendation: Small frequent meals, carbohydrate modified diet.  Filed Weights   02/18/13 1822 02/19/13 0524 02/20/13 0525  Weight: 86.183 kg (190 lb) 88.2 kg (194 lb 7.1 oz) 88.3 kg (194 lb 10.7 oz)    History of present illness:  This 79 year old lady presented to the hospital with symptoms of intractable vomiting. Please see initial history as outlined below: HPI: Kristin Logan is a 79 y.o. female who was sent to the emergency room by her nephrologist when she presented to his office with intractable vomiting. Her family reports that she has had vomiting on and off for the last few months. Symptoms usually wax and wane but have never really resolved. She may have one day where she may be able to tolerate food, but usually she ends up throwing up. Vomiting occurs with any type of food intake. Her family reports that she lost approximately 80 pounds in the past few months. She was becoming increasingly dehydrated, lightheaded and weak. She's not had any shortness of breath, cough, fever, dysuria. She has difficulty with constipation. Patient has been unable to follow with a gastroenterologist to do repeatedly getting ill and having other doctors appointments. She has been  admitted to the hospital for further workup of her vomiting.  Hospital Course:  The patient was admitted and given intravenous fluids, antiemetics. She was seen by gastroenterology who proceeded further workup. Gastric emptying study was positive for delayed emptying. She underwent a HIDA  scan which was negative for gallbladder disease. Once she has become better hydrated and she has started eating small frequent meals, her symptoms have improved. She will continue to do this as she goes home and she will follow up with gastroenterology, Kristin Logan in one month's time.  Procedures:  None.  Consultations:  Gastroenterology, Kristin Logan  Discharge Exam: Filed Vitals:   02/24/13 1522 02/24/13 1817 02/24/13 2030 02/25/13 0451  BP: 159/78  122/71 168/67  Pulse: 75  59 55  Temp: 97.3 F (36.3 C)  98.3 F (36.8 C) 97.8 F (36.6 C)  TempSrc: Oral  Oral Oral  Resp: 20  20 20   Height:      Weight:      SpO2: 96% 97% 94% 96%    General: She looks systemically well. She is obese. Cardiovascular: Heart sounds are present and normal without murmurs or added sounds. Respiratory: Lung fields are lung fields are clear. Abdomen is soft and nontender. She is alert and oriented.  Discharge Instructions  Discharge Orders   Future Appointments Provider Department Dept Phone   02/25/2013 1:45 PM Vickki Hearing, MD [facility] [phone]   03/31/2013 12:30 PM Salley Scarlet, MD [facility] [phone]   Future Orders Complete By Expires     Diet - low sodium heart healthy  As directed     Increase activity slowly  As directed  Medication List    TAKE these medications       acetaminophen 650 MG CR tablet  Commonly known as:  TYLENOL  Take 1 tablet (650 mg total) by mouth every 8 (eight) hours as needed for pain.     albuterol 108 (90 BASE) MCG/ACT inhaler  Commonly known as:  PROVENTIL HFA;VENTOLIN HFA  Inhale 2 puffs into  the lungs every 4 (four) hours as needed for wheezing.     albuterol (2.5 MG/3ML) 0.083% nebulizer solution  Commonly known as:  PROVENTIL  Take 3 mLs (2.5 mg total) by nebulization every 6 (six) hours as needed for wheezing.     amLODipine 5 MG tablet  Commonly known as:  NORVASC  Take 5 mg by mouth daily.     bisacodyl 10 MG suppository  Commonly known as:  DULCOLAX  Place 1 suppository (10 mg total) rectally daily as needed for constipation.     bisacodyl 5 MG EC tablet  Commonly known as:  DULCOLAX  Take 5 mg by mouth daily as needed for constipation.     buPROPion 75 MG tablet  Commonly known as:  WELLBUTRIN  Take 0.5 tablets (37.5 mg total) by mouth 2 (two) times daily.     calcium carbonate 600 MG Tabs  Commonly known as:  OS-CAL  Take 600 mg by mouth daily.     clotrimazole-betamethasone cream  Commonly known as:  LOTRISONE  Apply 1 application topically 2 (two) times daily.     diclofenac sodium 1 % Gel  Commonly known as:  VOLTAREN  Apply 2 g topically daily as needed (Pain).     glimepiride 1 MG tablet  Commonly known as:  AMARYL  Take 1 mg by mouth daily before breakfast.     HYDROcodone-acetaminophen 5-325 MG per tablet  Commonly known as:  NORCO  Take 1 tablet by mouth every 6 (six) hours as needed for pain.     lactulose 10 GM/15ML solution  Commonly known as:  CHRONULAC  Take 30 g by mouth daily as needed. Take 30ml daily for constipation as needed     levothyroxine 50 MCG tablet  Commonly known as:  SYNTHROID, LEVOTHROID  Take 50 mcg by mouth daily. To take 30 minutes prior to all other medications     metoprolol succinate 25 MG 24 hr tablet  Commonly known as:  TOPROL-XL  Take 25 mg by mouth every morning.     multivitamin tablet  Take 1 tablet by mouth daily.     omeprazole 20 MG capsule  Commonly known as:  PRILOSEC  Take 20 mg by mouth daily.     ondansetron 4 MG disintegrating tablet  Commonly known as:  ZOFRAN-ODT  Take 4 mg by  mouth every 6 (six) hours as needed for nausea.     polyethylene glycol packet  Commonly known as:  MIRALAX / GLYCOLAX  Take 17 g by mouth 2 (two) times daily.     simvastatin 20 MG tablet  Commonly known as:  ZOCOR  Take 1 tablet (20 mg total) by mouth at bedtime.     warfarin 10 MG tablet  Commonly known as:  COUMADIN  Take 1 tablet (10 mg total) by mouth one time only at 6 PM.       Allergies  Allergen Reactions  . Nsaids     Bleeding ulcer  . Codeine Nausea And Vomiting  . Adhesive (Tape) Rash    Redness, and peeling skin off.  Follow-up Information   Follow up with REHMAN,NAJEEB U, MD. Schedule an appointment as soon as possible for a visit in 1 month.   Contact information:   [address], [address] [place] [address] [phone]        The results of significant diagnostics from this hospitalization (including imaging, microbiology, ancillary and laboratory) are listed below for reference.    Significant Diagnostic Studies: Nm Gastric Emptying  02/21/2013   *RADIOLOGY REPORT*  Clinical Data:  Nausea vomiting.  Weight loss.  NUCLEAR MEDICINE GASTRIC EMPTYING SCAN  Technique:  After oral ingestion of radiolabeled meal, sequential abdominal images were obtained for 120 minutes.  Residual percentage of activity remaining within the stomach was calculated at 60 and 120 minutes.  Radiopharmaceutical:  2 mCi Tc-91m sulfur colloid.  Comparison:  None.  Findings: At 60 minutes post ingestion, 89% of the ingested activity remains within the stomach.  The 68% of the ingested activity is still within the gastric lumen at 120 minutes.  IMPRESSION: Delayed gastric emptying.  Normal is considered less than 30% activity remaining in the stomach at 120 minutes.   Original Report Authenticated By: Kennith Center, M.D.   Nm Hepato W/eject Fract  02/24/2013   *RADIOLOGY REPORT*  Clinical Data: Nausea, cholelithiasis, evaluate for cholecystitis and/or biliary dyskinesia.  NUCLEAR  MEDICINE HEPATOBILIARY IMAGING WITH GALLBLADDER EF  Technique:  Sequential images of the abdomen were obtained out to 60 minutes following intravenous administration of radiopharmaceutical.  After slow intravenous infusion of 1.77 micrograms Cholecystokinin, gallbladder ejection fraction was determined.  Radiopharmaceutical:   mCi Tc-66m Choletec  Comparison:  CT abdomen pelvis - 01/04/2013  Findings:  There is homogeneous distribution of injector radiotracer throughout the hepatic parenchyma.  There is early filling of the gallbladder, initially seen on the 25- minute anterior projection planar image.  There is opacification of the small bowel, initially seen on the 10- minute anterior projection planar image following the administration of CCK.  There is normal gallbladder ejection fraction of 59.3% (normal gallbladder ejection fraction: >30%).  The patient did not symptoms during CCK infusion.  IMPRESSION: No evidence of acute cholecystitis or biliary dyskinesia.  Normal gallbladder ejection fraction.   Original Report Authenticated By: Tacey Ruiz, MD   Dg Abd Acute W/chest  02/18/2013   *RADIOLOGY REPORT*  Clinical Data: Abdominal pain, nausea, vomiting, breast cancer, diabetes, hypertension, hyperlipidemia  ACUTE ABDOMEN SERIES (ABDOMEN 2 VIEW & CHEST 1 VIEW)  Comparison: 02/10/2013  Findings: Enlargement of cardiac silhouette. Calcified tortuous aorta. Pulmonary vascularity normal. Linear scarring versus chronic subsegmental atelectasis at right base. Lungs otherwise clear. No pleural effusion or pneumothorax. Bones demineralized. Nonobstructive bowel gas pattern. No bowel dilatation, bowel wall thickening, or free intraperitoneal air. Multilevel endplate spur formation thoracolumbar spine with question compression deformities at L2 and L1.  IMPRESSION: Enlargement of cardiac silhouette. Right basilar atelectasis versus scarring. No acute abdominal findings. Osseous demineralization with old appearing  compression deformities at L1 and L2.   Original Report Authenticated By: Ulyses Southward, M.D.   Dg Abd Acute W/chest  02/10/2013   *RADIOLOGY REPORT*  Clinical Data: Vomiting.  ACUTE ABDOMEN SERIES (ABDOMEN 2 VIEW & CHEST 1 VIEW)  Comparison: 01/16/2013 plain film exam.  01/15/2013 CT chest. 01/14/2013 CT abdomen pelvis.  Findings: Cardiomegaly.  Calcified tortuous aorta.  Post right axillary lymph node dissection.  7 mm ground-glass opacity noted within the left upper lobe on the recent CT cannot be assessed by plain film examination.  Please see prior CT report.  No infiltrate or pneumothorax.  Mild central pulmonary vascular prominence.  Nonspecific bowel gas pattern with gas filled top normal size small bowel loops and gas filled slightly featureless colonic loops. This represents an improvement since the prior plain film examination.  No free intraperitoneal air.  IMPRESSION: Nonspecific bowel gas pattern appears improved when compared to the most recent plain film examination.  Please see above.   Original Report Authenticated By: Lacy Duverney, M.D.   US Abdomen Limited Ruq  02/21/2013   *RADIOLOGY REPORT*  Clinical Data: Abdominal pain.  LIMITED RIGHT UPPER QUADRANT ULTRASOUND  Technique:  Complete abdominal ultrasound examination was performed including evaluation of the liver, gallbladder, bile ducts, pancreas, kidneys, spleen, IVC, and abdominal aorta.  Comparison: CT 01/14/2013  Findings: Multiple small gallstones are noted.  The largest is 6 mm.  No wall thickening or pericholecystic fluid.  No Murphy's sign.  The common bile duct is 10 mm in caliber.  The liver is diffusely heterogeneous and somewhat nodular.  It is enlarged with a vertical length of 18.6 cm.  IMPRESSION:  Cholelithiasis.  Dilated common bile duct.  Biliary obstruction is not excluded. Correlate with liver function tests as for the need for MRCP air.  Nodular and heterogeneous liver likely due to cirrhotic change. Diffuse hepatic  parenchymal disease can have a similar appearance.   Original Report Authenticated By: Jolaine Click, M.D.    Microbiology: Recent Results (from the past 240 hour(s))  URINE CULTURE     Status: None   Collection Time    02/18/13  4:53 PM      Result Value Range Status   Specimen Description URINE, CLEAN CATCH   Final   Special Requests NONE   Final   Culture  Setup Time 02/19/2013 01:29   Final   Colony Count NO GROWTH   Final   Culture NO GROWTH   Final   Report Status 02/19/2013 FINAL   Final     Labs: Basic Metabolic Panel:  Recent Labs Lab 02/20/13 0525 02/21/13 0521 02/22/13 0651 02/23/13 0633 02/24/13 0645  NA 135 138 136 138 134*  K 3.1* 3.1* 3.4* 3.9 3.7  CL 100 103 101 106 100  CO2 25 25 25 26 28   GLUCOSE 68* 78 78 81 83  BUN 9 4* 3* 4* 4*  CREATININE 1.01 0.88 0.80 0.78 0.88  CALCIUM 8.1* 8.5 8.3* 8.3* 8.7   Liver Function Tests:  Recent Labs Lab 02/18/13 1535 02/19/13 0535 02/22/13 0651  AST 39* 25 22  ALT 12 8 6   ALKPHOS 126* 97 89  BILITOT 0.4 0.4 0.6  PROT 7.7 6.1 5.2*  ALBUMIN 3.9 3.1* 2.7*    Recent Labs Lab 02/18/13 1535  LIPASE 25    CBC:  Recent Labs Lab 02/18/13 1535 02/20/13 0525 02/21/13 0521  WBC 9.9 5.5 6.1  NEUTROABS 4.1  --   --   HGB 14.6 11.9* 11.2*  HCT 42.4 34.8* 33.2*  MCV 83.3 84.1 84.9  PLT 301 220 232   Cardiac Enzymes:  Recent Labs Lab 02/18/13 1535  TROPONINI <0.30   BNP: BNP (last 3 results)  Recent Labs  11/11/12 0108 01/14/13 2215  PROBNP 354.6 386.4   CBG:  Recent Labs Lab 02/24/13 1140 02/24/13 1221 02/24/13 1619 02/24/13 2046 02/25/13 0728  GLUCAP 68* 93 95 122* 83       Signed:  Kenia Teagarden C  [name] 02/25/2013, 9:51 AM

## 2013-02-25 NOTE — Progress Notes (Addendum)
ANTICOAGULATION CONSULT NOTE  Pharmacy Consult for Warfarin Indication: atrial fibrillation  Allergies  Allergen Reactions  . Nsaids     Bleeding ulcer  . Codeine Nausea And Vomiting  . Adhesive (Tape) Rash    Redness, and peeling skin off.    Patient Measurements: Height: 5\' 5"  (165.1 cm) Weight: 194 lb 10.7 oz (88.3 kg) IBW/kg (Calculated) : 57  Vital Signs: Temp: 97.8 F (36.6 C) (05/27 0451) Temp src: Oral (05/27 0451) BP: 168/67 mmHg (05/27 0451) Pulse Rate: 55 (05/27 0451)  Labs:  Recent Labs  02/23/13 0633 02/24/13 0645 02/25/13 0444  LABPROT 14.8 14.9 15.9*  INR 1.18 1.19 1.30  CREATININE 0.78 0.88  --    Estimated Creatinine Clearance: 56.9 ml/min (by C-G formula based on Cr of 0.88).  Medical History: Past Medical History  Diagnosis Date  . Diabetes mellitus   . Hyperlipidemia   . Eosinophilia   . Cataract   . Hypertension   . Atrial fibrillation prior to 2008    moderate biatrial enlargement in 2009; mild to moderate LVH with a low-normal EF  . GERD (gastroesophageal reflux disease)     Hemorrhoids; history of peptic ulcer disease  . DJD (degenerative joint disease) of knee   . Sleep apnea     CPAP  . Chronic anticoagulation   . Aortic valve disease     2009: mild stenosis and regurgitation; peak gradient of 30-35 mmHg , subsequent AVR at [facility]  . Incontinence     Mild  . Carcinoma of breast     Right mastectomy; radiation and hormonal therapy  . Hip fracture, left 12/12/2012  . Foot injury 06/25/2012  . Degenerative joint disease of knee 01/29/2008    Bilateral    . BREAST CANCER, HX OF 01/29/2008    Lumpectomy, radiation therapy  Arimidex stopped in July 2013    . Syncope and collapse 11/11/2012  . Diverticulosis   . Umbilical hernia   . Cholelithiasis   . DDD (degenerative disc disease), lumbar   . Vertebral compression fracture     lumbar  . Chronic pain   . Paresthesias     feet   Medications:  Prescriptions prior to admission   Medication Sig Dispense Refill  . albuterol (PROVENTIL HFA;VENTOLIN HFA) 108 (90 BASE) MCG/ACT inhaler Inhale 2 puffs into the lungs every 4 (four) hours as needed for wheezing.  1 Inhaler  2  . albuterol (PROVENTIL) (2.5 MG/3ML) 0.083% nebulizer solution Take 3 mLs (2.5 mg total) by nebulization every 6 (six) hours as needed for wheezing.  75 mL  3  . amLODipine (NORVASC) 5 MG tablet Take 5 mg by mouth daily.      . bisacodyl (DULCOLAX) 10 MG suppository Place 1 suppository (10 mg total) rectally daily as needed for constipation.  12 suppository  2  . bisacodyl (DULCOLAX) 5 MG EC tablet Take 5 mg by mouth daily as needed for constipation.      Marland Kitchen buPROPion (WELLBUTRIN) 75 MG tablet Take 0.5 tablets (37.5 mg total) by mouth 2 (two) times daily.  60 tablet  1  . clotrimazole-betamethasone (LOTRISONE) cream Apply 1 application topically 2 (two) times daily.      Marland Kitchen glimepiride (AMARYL) 1 MG tablet Take 1 mg by mouth daily before breakfast.      . HYDROcodone-acetaminophen (NORCO) 5-325 MG per tablet Take 1 tablet by mouth every 6 (six) hours as needed for pain.  60 tablet  2  . levothyroxine (SYNTHROID, LEVOTHROID) 50 MCG tablet  Take 50 mcg by mouth daily. To take 30 minutes prior to all other medications      . metoprolol succinate (TOPROL-XL) 25 MG 24 hr tablet Take 25 mg by mouth every morning.       Marland Kitchen omeprazole (PRILOSEC) 20 MG capsule Take 20 mg by mouth daily.      . ondansetron (ZOFRAN-ODT) 4 MG disintegrating tablet Take 4 mg by mouth every 6 (six) hours as needed for nausea.      . simvastatin (ZOCOR) 20 MG tablet Take 1 tablet (20 mg total) by mouth at bedtime.  30 tablet  6  . warfarin (COUMADIN) 5 MG tablet Take 5 mg by mouth daily.       Marland Kitchen acetaminophen (TYLENOL) 650 MG CR tablet Take 1 tablet (650 mg total) by mouth every 8 (eight) hours as needed for pain.      . calcium carbonate (OS-CAL) 600 MG TABS Take 600 mg by mouth daily.      . diclofenac sodium (VOLTAREN) 1 % GEL Apply 2 g  topically daily as needed (Pain).      Marland Kitchen lactulose (CHRONULAC) 10 GM/15ML solution Take 30 g by mouth daily as needed. Take 30ml daily for constipation as needed      . Multiple Vitamin (MULTIVITAMIN) tablet Take 1 tablet by mouth daily.      . polyethylene glycol (MIRALAX / GLYCOLAX) packet Take 17 g by mouth 2 (two) times daily.       Assessment: PTA Patient was on chronic warfarin 5mg  daily for Afib. INR was elevated on admission.  Patient received 10mg  IV Vit K on 5/21 to reverse INR and is s/p EGD.  Warfarin resumed after EGD but has had sluggish INR response most likely due to effects of Vitamin K administration.  Goal of Therapy:  INR 2-3   Plan:  Repeat Coumadin 10mg  po x1 today Daily PT/INR. Change Protonix to po as patient is tolerating oral diet without nausea.   Elson Clan 02/25/2013,9:14 AM

## 2013-02-25 NOTE — Progress Notes (Signed)
UR chart review completed.  

## 2013-02-26 LAB — ACTH STIMULATION, 3 TIME POINTS
Cortisol, 30 Min: 6.6 ug/dL — ABNORMAL LOW (ref >20)
Cortisol, 60 Min: 9.5 ug/dL — ABNORMAL LOW (ref >20)

## 2013-02-27 ENCOUNTER — Telehealth: Payer: Self-pay | Admitting: Cardiology

## 2013-02-27 ENCOUNTER — Telehealth: Payer: Self-pay | Admitting: Family Medicine

## 2013-02-27 NOTE — Telephone Encounter (Signed)
Tried to call patient back and no option to leave message

## 2013-02-27 NOTE — Telephone Encounter (Signed)
Have her increase to 75mg  twice a day ( 1 whole tablet), for now this is the safest due to her problems with the sodium, she is on a very low dose of wellbutrin  Have her schedule for next week at [facility]

## 2013-02-27 NOTE — Telephone Encounter (Signed)
Patient was D/C'd from hospital on 10mg  of Coumadin.  [facility] has no orders on when patient is to be checked again.  Will you please call Rayfield Citizen to go over this. / tgs

## 2013-02-27 NOTE — Telephone Encounter (Signed)
States the wellbutrin isn't working- pt still depressed and feeling sad. Is there anything else she can try?

## 2013-02-28 ENCOUNTER — Ambulatory Visit (INDEPENDENT_AMBULATORY_CARE_PROVIDER_SITE_OTHER): Payer: Medicare Other | Admitting: *Deleted

## 2013-02-28 DIAGNOSIS — I482 Chronic atrial fibrillation, unspecified: Secondary | ICD-10-CM

## 2013-02-28 DIAGNOSIS — Z7901 Long term (current) use of anticoagulants: Secondary | ICD-10-CM

## 2013-02-28 DIAGNOSIS — I4891 Unspecified atrial fibrillation: Secondary | ICD-10-CM

## 2013-02-28 NOTE — Telephone Encounter (Signed)
INR ordered for 02/28/13.  Message left for [facility] and Fredia Beets RN [facility]

## 2013-03-03 ENCOUNTER — Ambulatory Visit (INDEPENDENT_AMBULATORY_CARE_PROVIDER_SITE_OTHER): Payer: Medicare Other | Admitting: *Deleted

## 2013-03-03 DIAGNOSIS — Z7901 Long term (current) use of anticoagulants: Secondary | ICD-10-CM

## 2013-03-03 DIAGNOSIS — I4891 Unspecified atrial fibrillation: Secondary | ICD-10-CM

## 2013-03-03 DIAGNOSIS — I482 Chronic atrial fibrillation, unspecified: Secondary | ICD-10-CM

## 2013-03-03 NOTE — Telephone Encounter (Signed)
Tried to call pt back again with no option to leave message

## 2013-03-04 ENCOUNTER — Telehealth (INDEPENDENT_AMBULATORY_CARE_PROVIDER_SITE_OTHER): Payer: Self-pay | Admitting: *Deleted

## 2013-03-04 NOTE — Telephone Encounter (Signed)
Gavin Pound said Kristin Logan is vomiting. Every thing she eats or drinks is coming back up withing 2 hours. Please have the nurse return her call at [phone].

## 2013-03-05 NOTE — Telephone Encounter (Signed)
I reached Carlyle Basques , the daughter of the patient at [phone].She states that her mother had not had a bowel movement in 3-4 days. She has taken Ducolax,Stool Softners, and suppostories. In addition to lactulose which the patient threw up. Also the patient had drank prune juice. The Bm that she had at first was hard, then later another that appeared normal to her daughter. She has been throwing up , the family put her on liquids again , and today she has not thrown up. They have ordered the Domperidone but was told that it would take up 20 days to rec'v. She cannot take the Zofran as per her daughter she could not keep it down. Gavin Pound was told that this would be discussed with Dr.Rehman and I would call her back.  Per Dr.Rehman the patient should use 1 -2 fleet enemas to help with the constipation Reglan 10 mg take 1 by mouth three times a day 2 weeks supply for nausea If she continues to have vomiting and constipation , they should take her to the ED for further evaluation.  The above prescription was called to [facility]/April , April was also told that someone in her family would come and pick the prescription up.  I ask Gavin Pound to call tomorrow with a progress report.

## 2013-03-07 ENCOUNTER — Ambulatory Visit (INDEPENDENT_AMBULATORY_CARE_PROVIDER_SITE_OTHER): Payer: Medicare Other | Admitting: *Deleted

## 2013-03-07 DIAGNOSIS — Z7901 Long term (current) use of anticoagulants: Secondary | ICD-10-CM

## 2013-03-07 DIAGNOSIS — I482 Chronic atrial fibrillation, unspecified: Secondary | ICD-10-CM

## 2013-03-07 DIAGNOSIS — I4891 Unspecified atrial fibrillation: Secondary | ICD-10-CM

## 2013-03-10 NOTE — Progress Notes (Signed)
I can not complete without a return date.

## 2013-03-13 ENCOUNTER — Telehealth: Payer: Self-pay | Admitting: *Deleted

## 2013-03-13 NOTE — Telephone Encounter (Signed)
Instructed home health nurse it will be fine to check INR tomorrow instead of today.

## 2013-03-13 NOTE — Telephone Encounter (Signed)
Needs orders to cancel PT/INR for today and order to do it tomorrow. Please call Leandra with instructions. / tgs

## 2013-03-14 ENCOUNTER — Ambulatory Visit (INDEPENDENT_AMBULATORY_CARE_PROVIDER_SITE_OTHER): Payer: Medicare Other | Admitting: *Deleted

## 2013-03-14 ENCOUNTER — Other Ambulatory Visit: Payer: Self-pay | Admitting: Family Medicine

## 2013-03-14 DIAGNOSIS — I4891 Unspecified atrial fibrillation: Secondary | ICD-10-CM

## 2013-03-14 DIAGNOSIS — I482 Chronic atrial fibrillation, unspecified: Secondary | ICD-10-CM

## 2013-03-14 DIAGNOSIS — Z7901 Long term (current) use of anticoagulants: Secondary | ICD-10-CM

## 2013-03-14 LAB — PROTIME-INR: INR: 4.1 — AB (ref 0.9–1.1)

## 2013-03-17 NOTE — Telephone Encounter (Signed)
Med refilled.

## 2013-03-18 ENCOUNTER — Ambulatory Visit (INDEPENDENT_AMBULATORY_CARE_PROVIDER_SITE_OTHER): Payer: Medicare Other | Admitting: *Deleted

## 2013-03-18 ENCOUNTER — Telehealth: Payer: Self-pay | Admitting: *Deleted

## 2013-03-18 DIAGNOSIS — Z7901 Long term (current) use of anticoagulants: Secondary | ICD-10-CM

## 2013-03-18 DIAGNOSIS — I4891 Unspecified atrial fibrillation: Secondary | ICD-10-CM

## 2013-03-18 DIAGNOSIS — I482 Chronic atrial fibrillation, unspecified: Secondary | ICD-10-CM

## 2013-03-18 NOTE — Telephone Encounter (Signed)
INR 2.7 PT 32.2  Patient did forget to take her coumadin last night per Okey Regal.

## 2013-03-18 NOTE — Telephone Encounter (Signed)
See coumadin note. 

## 2013-03-24 ENCOUNTER — Ambulatory Visit (INDEPENDENT_AMBULATORY_CARE_PROVIDER_SITE_OTHER): Payer: Medicare Other | Admitting: *Deleted

## 2013-03-24 DIAGNOSIS — Z7901 Long term (current) use of anticoagulants: Secondary | ICD-10-CM

## 2013-03-24 DIAGNOSIS — I482 Chronic atrial fibrillation, unspecified: Secondary | ICD-10-CM

## 2013-03-24 DIAGNOSIS — I4891 Unspecified atrial fibrillation: Secondary | ICD-10-CM

## 2013-03-25 DIAGNOSIS — M1710 Unilateral primary osteoarthritis, unspecified knee: Secondary | ICD-10-CM

## 2013-03-25 DIAGNOSIS — I4891 Unspecified atrial fibrillation: Secondary | ICD-10-CM

## 2013-03-25 DIAGNOSIS — F329 Major depressive disorder, single episode, unspecified: Secondary | ICD-10-CM

## 2013-03-25 DIAGNOSIS — E119 Type 2 diabetes mellitus without complications: Secondary | ICD-10-CM

## 2013-03-25 DIAGNOSIS — F3289 Other specified depressive episodes: Secondary | ICD-10-CM

## 2013-03-31 ENCOUNTER — Ambulatory Visit (INDEPENDENT_AMBULATORY_CARE_PROVIDER_SITE_OTHER): Payer: Medicare Other | Admitting: *Deleted

## 2013-03-31 ENCOUNTER — Ambulatory Visit (INDEPENDENT_AMBULATORY_CARE_PROVIDER_SITE_OTHER): Payer: Medicare Other | Admitting: Internal Medicine

## 2013-03-31 ENCOUNTER — Ambulatory Visit: Payer: Medicare Other | Admitting: Family Medicine

## 2013-03-31 ENCOUNTER — Ambulatory Visit (INDEPENDENT_AMBULATORY_CARE_PROVIDER_SITE_OTHER): Payer: Medicare Other | Admitting: Family Medicine

## 2013-03-31 VITALS — BP 130/70 | HR 58 | Temp 97.7°F | Resp 18 | Wt 201.0 lb

## 2013-03-31 DIAGNOSIS — R053 Chronic cough: Secondary | ICD-10-CM

## 2013-03-31 DIAGNOSIS — M1710 Unilateral primary osteoarthritis, unspecified knee: Secondary | ICD-10-CM

## 2013-03-31 DIAGNOSIS — I10 Essential (primary) hypertension: Secondary | ICD-10-CM

## 2013-03-31 DIAGNOSIS — R634 Abnormal weight loss: Secondary | ICD-10-CM

## 2013-03-31 DIAGNOSIS — R05 Cough: Secondary | ICD-10-CM

## 2013-03-31 DIAGNOSIS — E039 Hypothyroidism, unspecified: Secondary | ICD-10-CM

## 2013-03-31 DIAGNOSIS — F329 Major depressive disorder, single episode, unspecified: Secondary | ICD-10-CM

## 2013-03-31 DIAGNOSIS — F32A Depression, unspecified: Secondary | ICD-10-CM

## 2013-03-31 DIAGNOSIS — I482 Chronic atrial fibrillation, unspecified: Secondary | ICD-10-CM

## 2013-03-31 DIAGNOSIS — K5900 Constipation, unspecified: Secondary | ICD-10-CM

## 2013-03-31 DIAGNOSIS — E871 Hypo-osmolality and hyponatremia: Secondary | ICD-10-CM

## 2013-03-31 DIAGNOSIS — I4891 Unspecified atrial fibrillation: Secondary | ICD-10-CM

## 2013-03-31 DIAGNOSIS — E119 Type 2 diabetes mellitus without complications: Secondary | ICD-10-CM

## 2013-03-31 DIAGNOSIS — IMO0002 Reserved for concepts with insufficient information to code with codable children: Secondary | ICD-10-CM

## 2013-03-31 DIAGNOSIS — R059 Cough, unspecified: Secondary | ICD-10-CM

## 2013-03-31 DIAGNOSIS — K3184 Gastroparesis: Secondary | ICD-10-CM

## 2013-03-31 DIAGNOSIS — M170 Bilateral primary osteoarthritis of knee: Secondary | ICD-10-CM

## 2013-03-31 DIAGNOSIS — F3289 Other specified depressive episodes: Secondary | ICD-10-CM

## 2013-03-31 DIAGNOSIS — Z7901 Long term (current) use of anticoagulants: Secondary | ICD-10-CM

## 2013-03-31 LAB — CBC WITH DIFFERENTIAL/PLATELET
Basophils Absolute: 0.1 K/uL (ref 0.0–0.1)
Basophils Relative: 1 % (ref 0–1)
Eosinophils Absolute: 0.6 K/uL (ref 0.0–0.7)
Eosinophils Relative: 9 % — ABNORMAL HIGH (ref 0–5)
HCT: 35 % — ABNORMAL LOW (ref 36.0–46.0)
Hemoglobin: 11.6 g/dL — ABNORMAL LOW (ref 12.0–15.0)
Lymphocytes Relative: 39 % (ref 12–46)
Lymphs Abs: 2.6 K/uL (ref 0.7–4.0)
MCH: 28.5 pg (ref 26.0–34.0)
MCHC: 33.1 g/dL (ref 30.0–36.0)
MCV: 86 fL (ref 78.0–100.0)
Monocytes Absolute: 0.6 K/uL (ref 0.1–1.0)
Monocytes Relative: 10 % (ref 3–12)
Neutro Abs: 2.8 K/uL (ref 1.7–7.7)
Neutrophils Relative %: 41 % — ABNORMAL LOW (ref 43–77)
Platelets: 350 K/uL (ref 150–400)
RBC: 4.07 MIL/uL (ref 3.87–5.11)
RDW: 15.7 % — ABNORMAL HIGH (ref 11.5–15.5)
WBC: 6.6 K/uL (ref 4.0–10.5)

## 2013-03-31 LAB — POCT INR: INR: 2.3

## 2013-03-31 MED ORDER — ALBUTEROL SULFATE (2.5 MG/3ML) 0.083% IN NEBU: 2.5000 mg | INHALATION_SOLUTION | Freq: Four times a day (QID) | RESPIRATORY_TRACT | Status: DC | PRN

## 2013-03-31 MED ORDER — LEVOTHYROXINE SODIUM 50 MCG PO TABS: 50.0000 ug | ORAL_TABLET | Freq: Every day | ORAL | Status: DC

## 2013-03-31 MED ORDER — BUPROPION HCL 75 MG PO TABS: 75.0000 mg | ORAL_TABLET | Freq: Two times a day (BID) | ORAL | Status: DC

## 2013-03-31 NOTE — Patient Instructions (Addendum)
Increase depression medication wellbutrin to 1 tablet twice a day Restart synthroid Continue all other medications We will call with lab results Reschedule with Dr. Karilyn Cota F/U 2 months

## 2013-04-01 ENCOUNTER — Encounter: Payer: Self-pay | Admitting: Family Medicine

## 2013-04-01 DIAGNOSIS — K3184 Gastroparesis: Secondary | ICD-10-CM | POA: Insufficient documentation

## 2013-04-01 LAB — COMPREHENSIVE METABOLIC PANEL
ALT: <8 U/L (ref 0–35)
AST: 22 U/L (ref 0–37)
Albumin: 3.6 g/dL (ref 3.5–5.2)
BUN: 7 mg/dL (ref 6–23)
Calcium: 9.1 mg/dL (ref 8.4–10.5)
Chloride: 101 mEq/L (ref 96–112)
Potassium: 4 mEq/L (ref 3.5–5.3)
Sodium: 139 mEq/L (ref 135–145)
Total Protein: 6.2 g/dL (ref 6.0–8.3)

## 2013-04-01 NOTE — Assessment & Plan Note (Signed)
Her Amaryl was discontinued her last A1c was 5.8% we will continue to monitor her sugars

## 2013-04-01 NOTE — Assessment & Plan Note (Signed)
She has gained some weight and she is able to keep her food down. She will followup with gastroenterology

## 2013-04-01 NOTE — Assessment & Plan Note (Signed)
This appears to be a type of cough. Chronic bronchitis. Unfortunately she has not seen pulmonary as an outpatient either but she does use when necessary albuterol which they suggested during one of her admissions

## 2013-04-01 NOTE — Assessment & Plan Note (Signed)
Chronic hyponatremia unknown cause we'll repeat her sodium level she never did followup with endocrinology secondary to being sick and weak and unable to make it to appointments. I have been following her sodium levels and will continue to do so

## 2013-04-01 NOTE — Progress Notes (Signed)
  Subjective:    Patient ID: Kristin Logan, female    DOB: [birth year], 79 y.o.   MRN: [id number]  HPI Patient here to followup chronic medical problems. Since her last visit she was admitted again for nausea vomiting of uncertain cause this is thought to be secondary to gastroparesis versus delayed gastric emptying. She has not followed up with gastroenterology she was supposed to see them this morning but cannot make the appointment. She now uses ginger root 30 minutes before meals and this keeps her from vomiting. She has gained some weight. She's still depressed and unfortunately her and her husband did not work out so she is moved out of their home and is looking for new place to rent. She currently resides with her daughter who has been caring for her and her son. She is taking one half tablet of Wellbutrin twice a day but thinks he needs to be stronger. Medications reviewed her Synthroid was stopped however the reason is unknown. Review of hospital records did not show any evidence of stopping this medication. Her diabetes medication was also stopped secondary to lower blood sugars. She continues to have difficulty with her bowels and he sees multiple regimens over-the-counter as well as prescription but none have helped.  Review of Systems   GEN- denies fatigue, fever, weight loss,weakness, recent illness HEENT- denies eye drainage, change in vision, nasal discharge, CVS- denies chest pain, palpitations RESP- denies SOB, cough, wheeze ABD- denies N/V, change in stools, abd pain, + constipation GU- denies dysuria, hematuria, dribbling, incontinence MSK- +joint pain, muscle aches, injury Neuro- denies headache, dizziness, syncope, seizure activity      Objective:   Physical Exam  GEN- NAD, alert and oriented x 3 ,sitting in wheelchair, fatigued appearing  HEENT-PEERL, EOMI , oropharynx clear, MMM CVS- irregular rhythem, normal rate  2/6 SEM RESP-, CTAB  ABD-NABS,soft,NT,ND Ext-  no swelling Pulse- Radial 2+, DP 2+ Psych- depressed affect, not overly anxious, no SI, no hallucinations, good eye contact      Assessment & Plan:

## 2013-04-01 NOTE — Assessment & Plan Note (Signed)
Her depression is uncontrolled. I will increase her to 75 mg twice a day. Unfortunately her husband came from [place] but this did not work out she also moved in with her son as she was renting her previous home and is now looking for a new one

## 2013-04-01 NOTE — Assessment & Plan Note (Signed)
She may restart the Synthroid at 50 mcg daily

## 2013-04-01 NOTE — Assessment & Plan Note (Signed)
Constipation is unchanged she has used multiple agents. She's now and using Kristin Logan which helps her bowels some. She will followup with gastroenterology for any further recommendations. She's used MiraLAX, page, suppositories, enema

## 2013-04-01 NOTE — Assessment & Plan Note (Signed)
She continues to use hydrocodone for severe pain for her hip and knee

## 2013-04-09 ENCOUNTER — Telehealth: Payer: Self-pay | Admitting: Family Medicine

## 2013-04-09 NOTE — Telephone Encounter (Signed)
Pt has recently moved to [facility] and the SW has been out to visit her and stated everything ok. Needs written RX for pullups and glucerna in order for Medicaid to pay.. The Prescription should say Glucerna 2 cans a day PO everyday. Pull Ups should say 1 a day. Please fax over to attention of [facility] [phone].

## 2013-04-10 ENCOUNTER — Other Ambulatory Visit: Payer: Self-pay

## 2013-04-11 NOTE — Telephone Encounter (Signed)
Kristin Logan called back and wants to add to RX for bladder control pads 1/day.

## 2013-04-14 ENCOUNTER — Encounter: Payer: Self-pay | Admitting: Family Medicine

## 2013-04-14 DIAGNOSIS — R32 Unspecified urinary incontinence: Secondary | ICD-10-CM | POA: Insufficient documentation

## 2013-04-14 NOTE — Telephone Encounter (Signed)
Faxed over script to [facility]

## 2013-04-14 NOTE — Telephone Encounter (Signed)
Script written, please send

## 2013-04-16 ENCOUNTER — Other Ambulatory Visit: Payer: Self-pay | Admitting: Family Medicine

## 2013-04-16 NOTE — Telephone Encounter (Signed)
Med refill

## 2013-04-17 ENCOUNTER — Telehealth: Payer: Self-pay | Admitting: Family Medicine

## 2013-04-18 ENCOUNTER — Telehealth: Payer: Self-pay | Admitting: Family Medicine

## 2013-04-18 MED ORDER — HYDROCODONE-ACETAMINOPHEN 5-325 MG PO TABS: 1.0000 | ORAL_TABLET | Freq: Four times a day (QID) | ORAL | Status: DC | PRN

## 2013-04-18 NOTE — Telephone Encounter (Signed)
Do NOT take lisinopril Take the amlodipine and Metoprolol for blood pressure She is not on coumadin 10mg - this should be filled by the cardiologist ( she should have 5mg  and 2.5mg ) Okay to fill the hydrocodone  With 3 refills

## 2013-04-18 NOTE — Telephone Encounter (Signed)
Med refilled and pt daughter is aware of message

## 2013-04-21 NOTE — Telephone Encounter (Signed)
Spoke to pt on friday

## 2013-04-30 ENCOUNTER — Other Ambulatory Visit: Payer: Self-pay | Admitting: Family Medicine

## 2013-05-06 ENCOUNTER — Encounter (HOSPITAL_COMMUNITY): Payer: Self-pay | Admitting: *Deleted

## 2013-05-06 ENCOUNTER — Inpatient Hospital Stay (HOSPITAL_COMMUNITY)
Admission: EM | Admit: 2013-05-06 | Discharge: 2013-05-09 | DRG: 640 | Disposition: A | Discharge status: Home Health | Payer: Medicare Other | Attending: Family Medicine | Admitting: Family Medicine

## 2013-05-06 ENCOUNTER — Emergency Department (HOSPITAL_COMMUNITY): Payer: Medicare Other

## 2013-05-06 DIAGNOSIS — E119 Type 2 diabetes mellitus without complications: Secondary | ICD-10-CM | POA: Diagnosis present

## 2013-05-06 DIAGNOSIS — K3184 Gastroparesis: Secondary | ICD-10-CM | POA: Diagnosis present

## 2013-05-06 DIAGNOSIS — M51379 Other intervertebral disc degeneration, lumbosacral region without mention of lumbar back pain or lower extremity pain: Secondary | ICD-10-CM | POA: Diagnosis present

## 2013-05-06 DIAGNOSIS — M5137 Other intervertebral disc degeneration, lumbosacral region: Secondary | ICD-10-CM | POA: Diagnosis present

## 2013-05-06 DIAGNOSIS — E1169 Type 2 diabetes mellitus with other specified complication: Secondary | ICD-10-CM | POA: Diagnosis present

## 2013-05-06 DIAGNOSIS — E876 Hypokalemia: Secondary | ICD-10-CM

## 2013-05-06 DIAGNOSIS — E039 Hypothyroidism, unspecified: Secondary | ICD-10-CM | POA: Diagnosis present

## 2013-05-06 DIAGNOSIS — F3289 Other specified depressive episodes: Secondary | ICD-10-CM | POA: Diagnosis present

## 2013-05-06 DIAGNOSIS — Z923 Personal history of irradiation: Secondary | ICD-10-CM

## 2013-05-06 DIAGNOSIS — I4892 Unspecified atrial flutter: Secondary | ICD-10-CM | POA: Diagnosis present

## 2013-05-06 DIAGNOSIS — Z9010 Acquired absence of unspecified breast and nipple: Secondary | ICD-10-CM

## 2013-05-06 DIAGNOSIS — Z79899 Other long term (current) drug therapy: Secondary | ICD-10-CM

## 2013-05-06 DIAGNOSIS — G9340 Encephalopathy, unspecified: Secondary | ICD-10-CM | POA: Diagnosis present

## 2013-05-06 DIAGNOSIS — R112 Nausea with vomiting, unspecified: Secondary | ICD-10-CM

## 2013-05-06 DIAGNOSIS — Z853 Personal history of malignant neoplasm of breast: Secondary | ICD-10-CM

## 2013-05-06 DIAGNOSIS — I359 Nonrheumatic aortic valve disorder, unspecified: Secondary | ICD-10-CM | POA: Diagnosis present

## 2013-05-06 DIAGNOSIS — E785 Hyperlipidemia, unspecified: Secondary | ICD-10-CM | POA: Diagnosis present

## 2013-05-06 DIAGNOSIS — R4182 Altered mental status, unspecified: Secondary | ICD-10-CM

## 2013-05-06 DIAGNOSIS — Z7901 Long term (current) use of anticoagulants: Secondary | ICD-10-CM

## 2013-05-06 DIAGNOSIS — G8929 Other chronic pain: Secondary | ICD-10-CM | POA: Diagnosis present

## 2013-05-06 DIAGNOSIS — M1710 Unilateral primary osteoarthritis, unspecified knee: Secondary | ICD-10-CM | POA: Diagnosis present

## 2013-05-06 DIAGNOSIS — I482 Chronic atrial fibrillation, unspecified: Secondary | ICD-10-CM

## 2013-05-06 DIAGNOSIS — Z823 Family history of stroke: Secondary | ICD-10-CM

## 2013-05-06 DIAGNOSIS — I4891 Unspecified atrial fibrillation: Secondary | ICD-10-CM | POA: Diagnosis present

## 2013-05-06 DIAGNOSIS — K219 Gastro-esophageal reflux disease without esophagitis: Secondary | ICD-10-CM | POA: Diagnosis present

## 2013-05-06 DIAGNOSIS — Z8249 Family history of ischemic heart disease and other diseases of the circulatory system: Secondary | ICD-10-CM

## 2013-05-06 DIAGNOSIS — F329 Major depressive disorder, single episode, unspecified: Secondary | ICD-10-CM | POA: Diagnosis present

## 2013-05-06 DIAGNOSIS — E871 Hypo-osmolality and hyponatremia: Principal | ICD-10-CM | POA: Diagnosis present

## 2013-05-06 DIAGNOSIS — I10 Essential (primary) hypertension: Secondary | ICD-10-CM | POA: Diagnosis present

## 2013-05-06 DIAGNOSIS — E860 Dehydration: Secondary | ICD-10-CM

## 2013-05-06 DIAGNOSIS — Z952 Presence of prosthetic heart valve: Secondary | ICD-10-CM

## 2013-05-06 DIAGNOSIS — G4730 Sleep apnea, unspecified: Secondary | ICD-10-CM | POA: Diagnosis present

## 2013-05-06 LAB — PROTIME-INR: Prothrombin Time: 22.1 seconds — ABNORMAL HIGH (ref 11.6–15.2)

## 2013-05-06 LAB — BASIC METABOLIC PANEL
BUN: 4 mg/dL — ABNORMAL LOW (ref 6–23)
CO2: 22 mEq/L (ref 19–32)
Calcium: 9.9 mg/dL (ref 8.4–10.5)
Chloride: 83 mEq/L — ABNORMAL LOW (ref 96–112)
Creatinine, Ser: 0.55 mg/dL (ref 0.50–1.10)
Creatinine, Ser: 0.49 mg/dL — ABNORMAL LOW (ref 0.50–1.10)
GFR calc Af Amer: >90 mL/min (ref >90)
GFR calc non Af Amer: 87 mL/min — ABNORMAL LOW (ref >90)
Sodium: 116 mEq/L — CL (ref 135–145)

## 2013-05-06 LAB — CBC WITH DIFFERENTIAL/PLATELET
Basophils Absolute: 0 10*3/uL (ref 0.0–0.1)
Basophils Relative: 1 % (ref 0–1)
Eosinophils Relative: 11 % — ABNORMAL HIGH (ref 0–5)
HCT: 53.5 % — ABNORMAL HIGH (ref 36.0–46.0)
Lymphocytes Relative: 41 % (ref 12–46)
MCHC: 28.2 g/dL — ABNORMAL LOW (ref 30.0–36.0)
MCV: 107.2 fL — ABNORMAL HIGH (ref 78.0–100.0)
Monocytes Absolute: 0.6 10*3/uL (ref 0.1–1.0)
Platelets: 317 10*3/uL (ref 150–400)
RDW: 14.6 % (ref 11.5–15.5)
WBC: 7 10*3/uL (ref 4.0–10.5)

## 2013-05-06 LAB — URINE MICROSCOPIC-ADD ON

## 2013-05-06 LAB — URINALYSIS, ROUTINE W REFLEX MICROSCOPIC
Ketones, ur: 15 mg/dL — AB
Nitrite: NEGATIVE
Protein, ur: NEGATIVE mg/dL
Urobilinogen, UA: 0.2 mg/dL (ref 0.0–1.0)

## 2013-05-06 LAB — TROPONIN I: Troponin I: <0.3 ng/mL (ref <0.30)

## 2013-05-06 LAB — HEPATIC FUNCTION PANEL
ALT: 6 U/L (ref 0–35)
Bilirubin, Direct: 0.2 mg/dL (ref 0.0–0.3)
Indirect Bilirubin: 0.4 mg/dL (ref 0.3–0.9)
Total Protein: 8.4 g/dL — ABNORMAL HIGH (ref 6.0–8.3)

## 2013-05-06 LAB — GLUCOSE, CAPILLARY

## 2013-05-06 MED ORDER — SODIUM CHLORIDE 0.9 % IV SOLN
INTRAVENOUS | Status: DC
  Administered 2013-05-06: 21:00:00 via INTRAVENOUS

## 2013-05-06 MED ORDER — SIMVASTATIN 20 MG PO TABS
20.0000 mg | ORAL_TABLET | Freq: Every day | ORAL | Status: DC
  Administered 2013-05-06 – 2013-05-08 (×3): 20 mg via ORAL
  Filled 2013-05-06 (×3): qty 1

## 2013-05-06 MED ORDER — ONDANSETRON HCL 4 MG/2ML IJ SOLN
INTRAMUSCULAR | Status: AC
  Administered 2013-05-06: 4 mg via INTRAVENOUS
  Filled 2013-05-06: qty 2

## 2013-05-06 MED ORDER — WARFARIN SODIUM 5 MG PO TABS
5.0000 mg | ORAL_TABLET | ORAL | Status: DC
  Administered 2013-05-07: 5 mg via ORAL
  Filled 2013-05-06: qty 1

## 2013-05-06 MED ORDER — ALBUTEROL SULFATE HFA 108 (90 BASE) MCG/ACT IN AERS: 2.0000 | INHALATION_SPRAY | RESPIRATORY_TRACT | Status: DC | PRN

## 2013-05-06 MED ORDER — POTASSIUM CHLORIDE 10 MEQ/100ML IV SOLN
10.0000 meq | INTRAVENOUS | Status: AC
  Administered 2013-05-06 (×3): 10 meq via INTRAVENOUS
  Filled 2013-05-06: qty 100
  Filled 2013-05-06: qty 400

## 2013-05-06 MED ORDER — SODIUM CHLORIDE 0.9 % IV SOLN
Freq: Once | INTRAVENOUS | Status: AC
  Administered 2013-05-06: 19:00:00 via INTRAVENOUS

## 2013-05-06 MED ORDER — ONDANSETRON HCL 4 MG/2ML IJ SOLN: 4.0000 mg | Freq: Three times a day (TID) | INTRAMUSCULAR | Status: AC | PRN

## 2013-05-06 MED ORDER — PANTOPRAZOLE SODIUM 40 MG IV SOLR
40.0000 mg | INTRAVENOUS | Status: DC
  Administered 2013-05-06: 40 mg via INTRAVENOUS
  Filled 2013-05-06: qty 40

## 2013-05-06 MED ORDER — WARFARIN SODIUM 7.5 MG PO TABS: 7.5000 mg | ORAL_TABLET | ORAL | Status: DC

## 2013-05-06 MED ORDER — AMLODIPINE BESYLATE 5 MG PO TABS
5.0000 mg | ORAL_TABLET | Freq: Every day | ORAL | Status: DC
  Administered 2013-05-07 – 2013-05-09 (×3): 5 mg via ORAL
  Filled 2013-05-06 (×3): qty 1

## 2013-05-06 MED ORDER — SODIUM CHLORIDE 0.9 % IV SOLN: INTRAVENOUS | Status: DC

## 2013-05-06 MED ORDER — METOPROLOL SUCCINATE ER 25 MG PO TB24
25.0000 mg | ORAL_TABLET | Freq: Every morning | ORAL | Status: DC
  Administered 2013-05-07: 25 mg via ORAL
  Filled 2013-05-06: qty 1

## 2013-05-06 MED ORDER — WARFARIN - PHARMACIST DOSING INPATIENT: Freq: Every day | Status: DC

## 2013-05-06 MED ORDER — SODIUM CHLORIDE 0.9 % IV SOLN: 1000.0000 mL | INTRAVENOUS | Status: DC

## 2013-05-06 MED ORDER — ONDANSETRON HCL 4 MG/2ML IJ SOLN
4.0000 mg | Freq: Once | INTRAMUSCULAR | Status: AC
Start: 2013-05-06 — End: 2013-05-06

## 2013-05-06 MED ORDER — ONDANSETRON HCL 4 MG/2ML IJ SOLN
4.0000 mg | Freq: Once | INTRAMUSCULAR | Status: AC
  Administered 2013-05-06: 4 mg via INTRAVENOUS
  Filled 2013-05-06: qty 2

## 2013-05-06 MED ORDER — INSULIN ASPART 100 UNIT/ML ~~LOC~~ SOLN: 0.0000 [IU] | Freq: Three times a day (TID) | SUBCUTANEOUS | Status: DC

## 2013-05-06 MED ORDER — DICLOFENAC SODIUM 1 % TD GEL
1.0000 "application " | Freq: Every day | TRANSDERMAL | Status: DC | PRN
  Filled 2013-05-06: qty 100

## 2013-05-06 MED ORDER — LEVOTHYROXINE SODIUM 25 MCG PO TABS
50.0000 ug | ORAL_TABLET | Freq: Every day | ORAL | Status: DC
  Administered 2013-05-06: 50 ug via ORAL
  Filled 2013-05-06: qty 2

## 2013-05-06 MED ORDER — BUPROPION HCL 75 MG PO TABS
75.0000 mg | ORAL_TABLET | Freq: Every day | ORAL | Status: DC
  Administered 2013-05-07: 75 mg via ORAL
  Filled 2013-05-06: qty 1

## 2013-05-06 MED ORDER — ONDANSETRON HCL 4 MG/2ML IJ SOLN: 4.0000 mg | Freq: Four times a day (QID) | INTRAMUSCULAR | Status: DC | PRN

## 2013-05-06 MED ORDER — ONDANSETRON HCL 4 MG PO TABS
4.0000 mg | ORAL_TABLET | Freq: Four times a day (QID) | ORAL | Status: DC | PRN
  Administered 2013-05-06: 4 mg via ORAL
  Filled 2013-05-06: qty 1

## 2013-05-06 MED ORDER — SODIUM CHLORIDE 0.9 % IV SOLN
1000.0000 mL | Freq: Once | INTRAVENOUS | Status: AC
  Administered 2013-05-06: 1000 mL via INTRAVENOUS

## 2013-05-06 MED ORDER — HYDROCODONE-ACETAMINOPHEN 5-325 MG PO TABS
1.0000 | ORAL_TABLET | Freq: Four times a day (QID) | ORAL | Status: DC | PRN
Start: 2013-05-06 — End: 2013-05-09
  Administered 2013-05-06 – 2013-05-09 (×4): 1 via ORAL
  Filled 2013-05-06 (×5): qty 1

## 2013-05-06 NOTE — ED Provider Notes (Signed)
CSN: [id number]     Arrival date & time 05/06/13  1821 History     First MD Initiated Contact with Patient 05/06/13 1846     Chief Complaint  Patient presents with  . Nausea  . Emesis   LEVEL 5 Caveat for confusion  (Consider location/radiation/quality/duration/timing/severity/associated sxs/prior Treatment) HPI  Patient's son, whom she lives with, states that about 2 weeks ago patient had her Wellbutrin dose increased and she also was restarted on her Synthroid. He states she started having nausea and vomiting 3 days ago and is vomiting about 3 times a day. She has had decreased appetite. She does not have diarrhea and usually has constipation and only has a bowel movement every 5 days. She denies any abdominal pain, headache, or chest pain. She has not seen blood in her vomitus. Family states she was confused just prior to coming to the emergency department. She was recently diagnosed with gastroparesis and they started her on domperidone and she has had 3 doses of that. Family is concerned because she has been admitted in the past for hyponatremia. She was admitted in February and April with sodiums in the 116-118 range. At that time she was also on Wellbutrin and Synthroid. Family states they have read that Synthroid should not be given to patients on diabetes medication because of interactions.  PCP Dr Jeanice Lim  Past Medical History  Diagnosis Date  . Diabetes mellitus   . Hyperlipidemia   . Eosinophilia   . Cataract   . Hypertension   . Atrial fibrillation prior to 2008    moderate biatrial enlargement in 2009; mild to moderate LVH with a low-normal EF  . GERD (gastroesophageal reflux disease)     Hemorrhoids; history of peptic ulcer disease  . DJD (degenerative joint disease) of knee   . Sleep apnea     CPAP  . Chronic anticoagulation   . Aortic valve disease     2009: mild stenosis and regurgitation; peak gradient of 30-35 mmHg , subsequent AVR at [facility]  . Incontinence    Mild  . Carcinoma of breast     Right mastectomy; radiation and hormonal therapy  . Hip fracture, left 12/12/2012  . Foot injury 06/25/2012  . Degenerative joint disease of knee 01/29/2008    Bilateral    . BREAST CANCER, HX OF 01/29/2008    Lumpectomy, radiation therapy  Arimidex stopped in July 2013    . Syncope and collapse 11/11/2012  . Diverticulosis   . Umbilical hernia   . Cholelithiasis   . DDD (degenerative disc disease), lumbar   . Vertebral compression fracture     lumbar  . Chronic pain   . Paresthesias     feet   Past Surgical History  Procedure Laterality Date  . Exploration post operative open heart    . Colonoscopy      Approximately 2000  . Cardiac valve replacement      [facility]  . Cardiac valve replacement    . Breast surgery    . Breast lumpectomy    . Esophagogastroduodenoscopy N/A 02/20/2013    Procedure: ESOPHAGOGASTRODUODENOSCOPY (EGD);  Surgeon: Malissa Hippo, MD;  Location: [facility];  Service: Endoscopy;  Laterality: N/A;   Family History  Problem Relation Age of Onset  . Heart disease Mother   . Stroke Father   . Cancer Sister   . Heart disease Sister   . Stroke Sister   . Stroke Brother    History  Substance Use Topics  .  Smoking status: Never Smoker   . Smokeless tobacco: Not on file  . Alcohol Use: No   lives with son   OB History   Grav Para Term Preterm Abortions TAB SAB Ect Mult Living                 Review of Systems  All other systems reviewed and are negative.    Allergies  Nsaids; Codeine; and Adhesive  Home Medications   Current Outpatient Rx  Name  Route  Sig  Dispense  Refill  . acetaminophen (TYLENOL) 650 MG CR tablet   Oral   Take 1 tablet (650 mg total) by mouth every 8 (eight) hours as needed for pain.         Marland Kitchen albuterol (PROVENTIL HFA;VENTOLIN HFA) 108 (90 BASE) MCG/ACT inhaler   Inhalation   Inhale 2 puffs into the lungs every 4 (four) hours as needed for wheezing.   1 Inhaler   2   . albuterol  (PROVENTIL) (2.5 MG/3ML) 0.083% nebulizer solution   Nebulization   Take 3 mLs (2.5 mg total) by nebulization every 6 (six) hours as needed for wheezing.   75 mL   3   . amLODipine (NORVASC) 5 MG tablet   Oral   Take 5 mg by mouth daily.         Marland Kitchen buPROPion (WELLBUTRIN) 75 MG tablet   Oral   Take 1 tablet (75 mg total) by mouth 2 (two) times daily.   60 tablet   3   . clotrimazole-betamethasone (LOTRISONE) cream      APPLY TO THE AFEFCTED AREAS TWICE DAILY.   15 g   1   . HYDROcodone-acetaminophen (NORCO) 5-325 MG per tablet   Oral   Take 1 tablet by mouth every 6 (six) hours as needed for pain.   60 tablet   3   . levothyroxine (SYNTHROID, LEVOTHROID) 50 MCG tablet   Oral   Take 1 tablet (50 mcg total) by mouth daily. To take 30 minutes prior to all other medications   30 tablet   3   . metoprolol succinate (TOPROL-XL) 25 MG 24 hr tablet      TAKE ONE TABLET BY MOUTH DAILY FOR BLOOD PRESSURE.   30 tablet   2   . omeprazole (PRILOSEC) 20 MG capsule   Oral   Take 20 mg by mouth daily.         . simvastatin (ZOCOR) 20 MG tablet   Oral   Take 1 tablet (20 mg total) by mouth at bedtime.   30 tablet   6   . warfarin (COUMADIN) 10 MG tablet   Oral   Take 1 tablet (10 mg total) by mouth one time only at 6 PM.   30 tablet   0    BP 144/61  Pulse 55  Temp(Src) 97.1 F (36.2 C) (Rectal)  Resp 15  Ht 5\' 5"  (1.651 m)  Wt 200 lb (90.719 kg)  BMI 33.28 kg/m2  SpO2 99%  Vital signs normal except bradycardia  Physical Exam  Nursing note and vitals reviewed. Constitutional: She is oriented to person, place, and time. She appears well-developed and well-nourished.  Non-toxic appearance. She does not appear ill. No distress.  HENT:  Head: Normocephalic and atraumatic.  Right Ear: External ear normal.  Left Ear: External ear normal.  Nose: Nose normal. No mucosal edema or rhinorrhea.  Mouth/Throat: Mucous membranes are normal. No dental abscesses or  edematous.  Dry,  Eyes:  Conjunctivae and EOM are normal. Pupils are equal, round, and reactive to light.  Neck: Normal range of motion and full passive range of motion without pain. Neck supple.  Cardiovascular: Normal rate, regular rhythm and normal heart sounds.  Exam reveals no gallop and no friction rub.   No murmur heard. Pulmonary/Chest: Effort normal and breath sounds normal. No respiratory distress. She has no wheezes. She has no rhonchi. She has no rales. She exhibits no tenderness and no crepitus.  Abdominal: Soft. Normal appearance and bowel sounds are normal. She exhibits no distension. There is no tenderness. There is no rebound and no guarding.  Musculoskeletal: Normal range of motion. She exhibits no edema and no tenderness.  Patient is painful to palpation even with light touch of her lower extremities. There is no swelling, redness or abnormalities of the skin seen. She states she has chronic myalgias of her legs.  Neurological: She is alert and oriented to person, place, and time. She has normal strength. No cranial nerve deficit.  Appears to be mentally slow  Skin: Skin is warm, dry and intact. No rash noted. No erythema. No pallor.  Psychiatric: Her speech is normal and behavior is normal. Her mood appears not anxious.  Flat affect    ED Course   Medications  0.9 %  sodium chloride infusion (0 mLs Intravenous Stopped 05/06/13 2038)  ondansetron (ZOFRAN) injection 4 mg (4 mg Intravenous Given 05/06/13 1853)  0.9 %  sodium chloride infusion ( Intravenous Stopped 05/06/13 2038)  ondansetron (ZOFRAN) injection 4 mg (4 mg Intravenous Given 05/06/13 1918)     Procedures (including critical care time)  Family and patient given results of her blood work, with recurrent hyponatremia.   20:04 Dr Sharl Ma admit to step down team 1  Results for orders placed during the hospital encounter of 05/06/13  CBC WITH DIFFERENTIAL      Result Value Range   WBC 7.0  4.0 - 10.5 K/uL   RBC 4.99   3.87 - 5.11 MIL/uL   Hemoglobin 15.1 (*) 12.0 - 15.0 g/dL   HCT 16.1 (*) 09.6 - 04.5 %   MCV 107.2 (*) 78.0 - 100.0 fL   MCH 30.3  26.0 - 34.0 pg   MCHC 28.2 (*) 30.0 - 36.0 g/dL   RDW 40.9  81.1 - 91.4 %   Platelets 317  150 - 400 K/uL   Neutrophils Relative % 39 (*) 43 - 77 %   Neutro Abs 2.7  1.7 - 7.7 K/uL   Lymphocytes Relative 41  12 - 46 %   Lymphs Abs 2.9  0.7 - 4.0 K/uL   Monocytes Relative 9  3 - 12 %   Monocytes Absolute 0.6  0.1 - 1.0 K/uL   Eosinophils Relative 11 (*) 0 - 5 %   Eosinophils Absolute 0.7  0.0 - 0.7 K/uL   Basophils Relative 1  0 - 1 %   Basophils Absolute 0.0  0.0 - 0.1 K/uL  BASIC METABOLIC PANEL      Result Value Range   Sodium 116 (*) 135 - 145 mEq/L   Potassium 3.0 (*) 3.5 - 5.1 mEq/L   Chloride 79 (*) 96 - 112 mEq/L   CO2 22  19 - 32 mEq/L   Glucose, Bld 88  70 - 99 mg/dL   BUN 4 (*) 6 - 23 mg/dL   Creatinine, Ser 7.82  0.50 - 1.10 mg/dL   Calcium 9.9  8.4 - 95.6 mg/dL   GFR calc non  Af Amer 87 (*) >90 mL/min   GFR calc Af Amer >90  >90 mL/min  URINALYSIS, ROUTINE W REFLEX MICROSCOPIC      Result Value Range   Color, Urine YELLOW  YELLOW   APPearance CLEAR  CLEAR   Specific Gravity, Urine 1.015  1.005 - 1.030   pH 6.5  5.0 - 8.0   Glucose, UA NEGATIVE  NEGATIVE mg/dL   Hgb urine dipstick TRACE (*) NEGATIVE   Bilirubin Urine NEGATIVE  NEGATIVE   Ketones, ur 15 (*) NEGATIVE mg/dL   Protein, ur NEGATIVE  NEGATIVE mg/dL   Urobilinogen, UA 0.2  0.0 - 1.0 mg/dL   Nitrite NEGATIVE  NEGATIVE   Leukocytes, UA TRACE (*) NEGATIVE  HEPATIC FUNCTION PANEL      Result Value Range   Total Protein 8.4 (*) 6.0 - 8.3 g/dL   Albumin 4.5  3.5 - 5.2 g/dL   AST 26  0 - 37 U/L   ALT 6  0 - 35 U/L   Alkaline Phosphatase 72  39 - 117 U/L   Total Bilirubin 0.6  0.3 - 1.2 mg/dL   Bilirubin, Direct 0.2  0.0 - 0.3 mg/dL   Indirect Bilirubin 0.4  0.3 - 0.9 mg/dL  TROPONIN I      Result Value Range   Troponin I <0.30  <0.30 ng/mL  URINE MICROSCOPIC-ADD ON       Result Value Range   Squamous Epithelial / LPF RARE  RARE   WBC, UA 3-6  <3 WBC/hpf   RBC / HPF 0-2  <3 RBC/hpf   Bacteria, UA RARE  RARE   Laboratory interpretation all normal except hyponatremia, hypokalemia, concentrated hemoglobin consistent with dehydration   Dg Chest Portable 1 View  05/06/2013   *RADIOLOGY REPORT*  Clinical Data: Chest pain.  PORTABLE CHEST - 1 VIEW  Comparison: Chest x-ray 02/18/2013.  Findings: There is cephalization of the pulmonary vasculature and slight indistinctness of the interstitial markings suggestive of mild pulmonary edema.  Mild cardiomegaly.  Extensive atherosclerosis of the thoracic aorta.  No definite pleural effusions.  No acute consolidative airspace disease.  Multiple wires project over the thorax just to the right of midline.  IMPRESSION: 1.  Findings, as above, concerning for very mild congestive heart failure. 2.  Atherosclerosis.   Original Report Authenticated By: Trudie Reed, M.D.     Date: 05/06/2013  Rate: 60  Rhythm: atrial fibrillation and premature ventricular contractions (PVC)  QRS Axis: normal  Intervals: normal  ST/T Wave abnormalities: nonspecific ST/T changes  Conduction Disutrbances:none  Narrative Interpretation:   Old EKG Reviewed: unchanged from 02/18/2013     1. Hyponatremia   2. Hypokalemia   3. Altered mental status   4. Nausea and vomiting   5. Dehydration   6. Aortic valve disease   7. Atrial fibrillation, chronic   8. Chronic anticoagulation    Plan admission   Devoria Albe, MD, FACEP   CRITICAL CARE Performed by: Devoria Albe L Total critical care time: 31 min Critical care time was exclusive of separately billable procedures and treating other patients. Critical care was necessary to treat or prevent imminent or life-threatening deterioration. Critical care was time spent personally by me on the following activities: development of treatment plan with patient and/or surrogate as well as nursing,  discussions with consultants, evaluation of patient's response to treatment, examination of patient, obtaining history from patient or surrogate, ordering and performing treatments and interventions, ordering and review of laboratory studies, ordering and review of radiographic  studies, pulse oximetry and re-evaluation of patient's condition.    MDM    Ward Givens, MD 05/06/13 2145

## 2013-05-06 NOTE — ED Notes (Signed)
CRITICAL VALUE ALERT  Critical value received:  Sodium 116  Date of notification:  05/06/2013  Time of notification:  1928  Critical value read back:yes  Nurse who received alert:  GM  MD notified (1st page):  IK  Time of first page:  1928  MD notified (2nd page):  Time of second page:  Responding MD:  Salley Slaughter  Time MD responded:  1610

## 2013-05-06 NOTE — ED Notes (Signed)
Nausea and vomiting began 3 or more days ago, but has been feeling nauseated x 1 week.  Has been c/o diaphoresis, itching, joint pain, weakness, constipation. No insect bites known of.   No CP or abd. Pain.  Had increased dose of Welbutin about a two weeks ago.  Saw [name] June 30, was to have appt w/Dr. Minus Liberty tomorrow but patient kept getting worse.

## 2013-05-06 NOTE — H&P (Addendum)
PCP:   [name], MD   Chief Complaint:  Nausea vomiting  HPI: 79 year old female with a history of hypertension, atrial fibrillation, status post aVR with bioprosthetic device, on chronic anticoagulation with Coumadin, hypothyroidism, depression who came to the ED today with history of nausea vomiting for past one week. As per patient's daughter, patient was taking Wellbutrin and Synthroid. The dose of Wellbutrin was increased to 75 mg by mouth twice a day earlier she was taking once a day. And since the dose has been changed patient has not been feeling well and complains of nausea and vomiting. She denies diarrhea, she also admits to having nausea on movement and feels better when laying still. There is no abdominal pain no history of gallbladder problem. She denies chest pain, no shortness of breath.  Allergies:   Allergies  Allergen Reactions  . Nsaids     Bleeding ulcer  . Codeine Nausea And Vomiting  . Adhesive (Tape) Rash    Redness, and peeling skin off.       Past Medical History  Diagnosis Date  . Diabetes mellitus   . Hyperlipidemia   . Eosinophilia   . Cataract   . Hypertension   . Atrial fibrillation prior to 2008    moderate biatrial enlargement in 2009; mild to moderate LVH with a low-normal EF  . GERD (gastroesophageal reflux disease)     Hemorrhoids; history of peptic ulcer disease  . DJD (degenerative joint disease) of knee   . Sleep apnea     CPAP  . Chronic anticoagulation   . Aortic valve disease     2009: mild stenosis and regurgitation; peak gradient of 30-35 mmHg , subsequent AVR at [facility]  . Incontinence     Mild  . Carcinoma of breast     Right mastectomy; radiation and hormonal therapy  . Hip fracture, left 12/12/2012  . Foot injury 06/25/2012  . Degenerative joint disease of knee 01/29/2008    Bilateral    . BREAST CANCER, HX OF 01/29/2008    Lumpectomy, radiation therapy  Arimidex stopped in July 2013    . Syncope and collapse 11/11/2012   . Diverticulosis   . Umbilical hernia   . Cholelithiasis   . DDD (degenerative disc disease), lumbar   . Vertebral compression fracture     lumbar  . Chronic pain   . Paresthesias     feet    Past Surgical History  Procedure Laterality Date  . Exploration post operative open heart    . Colonoscopy      Approximately 2000  . Cardiac valve replacement      [facility]  . Cardiac valve replacement    . Breast surgery    . Breast lumpectomy    . Esophagogastroduodenoscopy N/A 02/20/2013    Procedure: ESOPHAGOGASTRODUODENOSCOPY (EGD);  Surgeon: Malissa Hippo, MD;  Location: [facility];  Service: Endoscopy;  Laterality: N/A;    Prior to Admission medications   Medication Sig Start Date End Date Taking? Authorizing Provider  acetaminophen (TYLENOL) 650 MG CR tablet Take 1 tablet (650 mg total) by mouth every 8 (eight) hours as needed for pain. 01/21/13  Yes Standley Brooking, MD  albuterol (PROVENTIL HFA;VENTOLIN HFA) 108 (90 BASE) MCG/ACT inhaler Inhale 2 puffs into the lungs every 4 (four) hours as needed for wheezing. 10/15/12 10/15/13 Yes Salley Scarlet, MD  albuterol (PROVENTIL) (2.5 MG/3ML) 0.083% nebulizer solution Take 3 mLs (2.5 mg total) by nebulization every 6 (six) hours as needed  for wheezing. 03/31/13  Yes Salley Scarlet, MD  amLODipine (NORVASC) 5 MG tablet Take 5 mg by mouth daily.   Yes Historical Provider, MD  buPROPion (WELLBUTRIN) 75 MG tablet Take 1 tablet (75 mg total) by mouth 2 (two) times daily. 03/31/13 03/31/14 Yes Salley Scarlet, MD  clotrimazole-betamethasone (LOTRISONE) cream Apply 1 application topically 2 (two) times daily.   Yes Historical Provider, MD  diclofenac sodium (VOLTAREN) 1 % GEL Apply 1 application topically daily as needed (for knee pain).   Yes Historical Provider, MD  glimepiride (AMARYL) 1 MG tablet Take 1 mg by mouth every morning.   Yes Historical Provider, MD  HYDROcodone-acetaminophen (NORCO) 5-325 MG per tablet Take 1 tablet by mouth  every 6 (six) hours as needed for pain. 04/18/13 04/18/14 Yes Salley Scarlet, MD  levothyroxine (SYNTHROID, LEVOTHROID) 50 MCG tablet Take 1 tablet (50 mcg total) by mouth daily. To take 30 minutes prior to all other medications 03/31/13  Yes Salley Scarlet, MD  metoprolol succinate (TOPROL-XL) 25 MG 24 hr tablet Take 25 mg by mouth every morning.   Yes Historical Provider, MD  omeprazole (PRILOSEC) 20 MG capsule Take 20 mg by mouth daily.   Yes Historical Provider, MD  PRESCRIPTION MEDICATION Take 10 mg by mouth 2 (two) times daily. MOTILIUM: Domperidone 10 mg   Yes Historical Provider, MD  simvastatin (ZOCOR) 20 MG tablet Take 1 tablet (20 mg total) by mouth at bedtime. 01/28/13  Yes Salley Scarlet, MD  warfarin (COUMADIN) 5 MG tablet Take 5-7.5 mg by mouth See admin instructions. Take one tablet daily except take one & one-half tablet on Tuesdays and Fridays.   Yes Historical Provider, MD    Social History:  reports that she has never smoked. She does not have any smokeless tobacco history on file. She reports that she does not drink alcohol or use illicit drugs.  Family History  Problem Relation Age of Onset  . Heart disease Mother   . Stroke Father   . Cancer Sister   . Heart disease Sister   . Stroke Sister   . Stroke Brother      All the positives are listed in BOLD  Review of Systems:  HEENT: Headache, blurred vision, runny nose, sore throat Neck: Hypothyroidism, hyperthyroidism,,lymphadenopathy Chest : Shortness of breath, history of COPD, Asthma Heart : Chest pain, history of coronary arterey disease GI:  Nausea, vomiting, diarrhea, constipation, GERD GU: Dysuria, urgency, frequency of urination, hematuria Neuro: Stroke, seizures, syncope Psych: Depression, anxiety, hallucinations   Physical Exam: Blood pressure 144/61, pulse 53, temperature 97.1 F (36.2 C), temperature source Rectal, resp. rate 12, height 5\' 5"  (1.651 m), weight 90.719 kg (200 lb), SpO2  98.00%. Constitutional:   Patient is a well-developed and well-nourished *female in no acute distress and cooperative with exam. Head: Normocephalic and atraumatic Mouth: Mucus membranes moist Eyes: PERRL, EOMI, conjunctivae normal Neck: Supple, No Thyromegaly Cardiovascular: RRR, S1 normal, S2 normal Pulmonary/Chest: CTAB, no wheezes, rales, or rhonchi Abdominal: Soft. Non-tender, non-distended, bowel sounds are normal, no masses, organomegaly, or guarding present.  Neurological: A&O x3, Strenght is normal and symmetric bilaterally, cranial nerve II-XII are grossly intact, no focal motor deficit, sensory intact to light touch bilaterally.  Extremities : No Cyanosis, Clubbing or Edema   Labs on Admission:  Results for orders placed during the hospital encounter of 05/06/13 (from the past 48 hour(s))  CBC WITH DIFFERENTIAL     Status: Abnormal   Collection Time  05/06/13  6:55 PM      Result Value Range   WBC 7.0  4.0 - 10.5 K/uL   RBC 4.99  3.87 - 5.11 MIL/uL   Hemoglobin 15.1 (*) 12.0 - 15.0 g/dL   HCT 21.3 (*) 08.6 - 57.8 %   MCV 107.2 (*) 78.0 - 100.0 fL   MCH 30.3  26.0 - 34.0 pg   MCHC 28.2 (*) 30.0 - 36.0 g/dL   RDW 46.9  62.9 - 52.8 %   Platelets 317  150 - 400 K/uL   Neutrophils Relative % 39 (*) 43 - 77 %   Neutro Abs 2.7  1.7 - 7.7 K/uL   Lymphocytes Relative 41  12 - 46 %   Lymphs Abs 2.9  0.7 - 4.0 K/uL   Monocytes Relative 9  3 - 12 %   Monocytes Absolute 0.6  0.1 - 1.0 K/uL   Eosinophils Relative 11 (*) 0 - 5 %   Eosinophils Absolute 0.7  0.0 - 0.7 K/uL   Basophils Relative 1  0 - 1 %   Basophils Absolute 0.0  0.0 - 0.1 K/uL  BASIC METABOLIC PANEL     Status: Abnormal   Collection Time    05/06/13  6:55 PM      Result Value Range   Sodium 116 (*) 135 - 145 mEq/L   Comment: CRITICAL RESULT CALLED TO, READ BACK BY AND VERIFIED WITH:     MEADOWS,G ON 05/06/13 AT 1830 BY LOY,C   Potassium 3.0 (*) 3.5 - 5.1 mEq/L   Chloride 79 (*) 96 - 112 mEq/L   CO2 22  19 -  32 mEq/L   Glucose, Bld 88  70 - 99 mg/dL   BUN 4 (*) 6 - 23 mg/dL   Creatinine, Ser 4.13  0.50 - 1.10 mg/dL   Calcium 9.9  8.4 - 24.4 mg/dL   GFR calc non Af Amer 87 (*) >90 mL/min   GFR calc Af Amer >90  >90 mL/min   Comment:            The eGFR has been calculated     using the CKD EPI equation.     This calculation has not been     validated in all clinical     situations.     eGFR's persistently     <90 mL/min signify     possible Chronic Kidney Disease.  HEPATIC FUNCTION PANEL     Status: Abnormal   Collection Time    05/06/13  6:55 PM      Result Value Range   Total Protein 8.4 (*) 6.0 - 8.3 g/dL   Albumin 4.5  3.5 - 5.2 g/dL   AST 26  0 - 37 U/L   ALT 6  0 - 35 U/L   Alkaline Phosphatase 72  39 - 117 U/L   Total Bilirubin 0.6  0.3 - 1.2 mg/dL   Bilirubin, Direct 0.2  0.0 - 0.3 mg/dL   Indirect Bilirubin 0.4  0.3 - 0.9 mg/dL  TROPONIN I     Status: None   Collection Time    05/06/13  6:55 PM      Result Value Range   Troponin I <0.30  <0.30 ng/mL   Comment:            Due to the release kinetics of cTnI,     a negative result within the first hours     of the onset of symptoms does not rule  out     myocardial infarction with certainty.     If myocardial infarction is still suspected,     repeat the test at appropriate intervals.    Radiological Exams on Admission: Dg Chest Portable 1 View  05/06/2013   *RADIOLOGY REPORT*  Clinical Data: Chest pain.  PORTABLE CHEST - 1 VIEW  Comparison: Chest x-ray 02/18/2013.  Findings: There is cephalization of the pulmonary vasculature and slight indistinctness of the interstitial markings suggestive of mild pulmonary edema.  Mild cardiomegaly.  Extensive atherosclerosis of the thoracic aorta.  No definite pleural effusions.  No acute consolidative airspace disease.  Multiple wires project over the thorax just to the right of midline.  IMPRESSION: 1.  Findings, as above, concerning for very mild congestive heart failure. 2.   Atherosclerosis.   Original Report Authenticated By: Trudie Reed, M.D.    Assessment/Plan Active Problems:   DIABETES MELLITUS, TYPE II   Atrial fibrillation, chronic   Aortic valve disease   Depression   Hypothyroidism   Hyponatremia  Hyponatremia Likely due to nausea vomiting, dehydration. We'll start IV normal saline at 75 mL per hour. Will check BMP every 4 hours. Patient will be put on fluid restriction of 1200 mL per day Patient has been taking Wellbutrin which can cause hyponatremia Will cut down the dose of Wellbutrin to once a day Will obtain serum and urine osmolality.  Nausea/vomiting ? Cause We'll give Zofran for nausea vomiting Could be viral or medication induced  Will follow  Chronic atrial fibrillation Patient's heart rate is controlled, EKG shows nonspecific ST-T changes Facet of cardiac enzymes are negative We'll obtain 3 sets of troponin Patient is on Coumadin, we'll get pharmacy to manage the PT/INR  Diabetes mellitus We'll start since insulin Hold the oral hypoglycemic agents at this time  Depression Will change Wellbutrin to 75 mg once a day  Hypothyroidism Continue Synthroid  Aortic valve disease Patient has bioprosthetic aortic valve No acute issues at this time  Hypokalemia Likely due to nausea vomiting Will replace potassium  Code status:  Family discussion:   Time Spent on Admission: 65 min  Rachella Basden S [name] Pager: [phone] 05/06/2013, 8:40 PM  If 7PM-7AM, please contact night-coverage  [email or web address]  Password [email or web address]

## 2013-05-07 ENCOUNTER — Ambulatory Visit (INDEPENDENT_AMBULATORY_CARE_PROVIDER_SITE_OTHER): Payer: Medicare Other | Admitting: Internal Medicine

## 2013-05-07 DIAGNOSIS — R112 Nausea with vomiting, unspecified: Secondary | ICD-10-CM

## 2013-05-07 DIAGNOSIS — E860 Dehydration: Secondary | ICD-10-CM

## 2013-05-07 DIAGNOSIS — G9340 Encephalopathy, unspecified: Secondary | ICD-10-CM

## 2013-05-07 LAB — BASIC METABOLIC PANEL
CO2: 20 mEq/L (ref 19–32)
Calcium: 9.1 mg/dL (ref 8.4–10.5)
Chloride: 91 mEq/L — ABNORMAL LOW (ref 96–112)
Creatinine, Ser: 0.47 mg/dL — ABNORMAL LOW (ref 0.50–1.10)
GFR calc Af Amer: >90 mL/min (ref >90)
GFR calc non Af Amer: >90 mL/min (ref >90)
Glucose, Bld: 74 mg/dL (ref 70–99)
Potassium: 3.6 mEq/L (ref 3.5–5.1)
Sodium: 124 mEq/L — ABNORMAL LOW (ref 135–145)

## 2013-05-07 LAB — GLUCOSE, CAPILLARY
Glucose-Capillary: 61 mg/dL — ABNORMAL LOW (ref 70–99)
Glucose-Capillary: 103 mg/dL — ABNORMAL HIGH (ref 70–99)

## 2013-05-07 LAB — CBC
MCH: 29.5 pg (ref 26.0–34.0)
MCV: 81.7 fL (ref 78.0–100.0)
Platelets: 261 10*3/uL (ref 150–400)
RDW: 13.6 % (ref 11.5–15.5)

## 2013-05-07 LAB — COMPREHENSIVE METABOLIC PANEL
AST: 22 U/L (ref 0–37)
Albumin: 3.6 g/dL (ref 3.5–5.2)
Calcium: 8.9 mg/dL (ref 8.4–10.5)
Creatinine, Ser: 0.47 mg/dL — ABNORMAL LOW (ref 0.50–1.10)
GFR calc non Af Amer: >90 mL/min (ref >90)

## 2013-05-07 LAB — PROTIME-INR
INR: 2.38 — ABNORMAL HIGH (ref 0.00–1.49)
Prothrombin Time: 25.2 seconds — ABNORMAL HIGH (ref 11.6–15.2)

## 2013-05-07 LAB — TROPONIN I: Troponin I: <0.3 ng/mL (ref <0.30)

## 2013-05-07 LAB — TSH: TSH: 0.963 u[IU]/mL (ref 0.350–4.500)

## 2013-05-07 MED ORDER — SODIUM CHLORIDE 0.9 % IV SOLN
INTRAVENOUS | Status: AC
  Administered 2013-05-07: 400 mL via INTRAVENOUS
  Administered 2013-05-08: 1000 mL via INTRAVENOUS

## 2013-05-07 MED ORDER — PANTOPRAZOLE SODIUM 40 MG PO TBEC
40.0000 mg | DELAYED_RELEASE_TABLET | Freq: Every day | ORAL | Status: DC
  Administered 2013-05-07 – 2013-05-09 (×3): 40 mg via ORAL
  Filled 2013-05-07 (×3): qty 1

## 2013-05-07 MED ORDER — WARFARIN - PHARMACIST DOSING INPATIENT
Status: DC
  Administered 2013-05-08: 17:00:00

## 2013-05-07 MED ORDER — ENSURE COMPLETE PO LIQD
237.0000 mL | Freq: Two times a day (BID) | ORAL | Status: DC
  Administered 2013-05-07 – 2013-05-08 (×2): 237 mL via ORAL

## 2013-05-07 MED ORDER — WARFARIN SODIUM 5 MG PO TABS
5.0000 mg | ORAL_TABLET | Freq: Once | ORAL | Status: AC
  Administered 2013-05-07: 5 mg via ORAL
  Filled 2013-05-07: qty 1

## 2013-05-07 MED ORDER — BOOST / RESOURCE BREEZE PO LIQD: 1.0000 | Freq: Every day | ORAL | Status: DC

## 2013-05-07 MED ORDER — GLUCOSE 40 % PO GEL
ORAL | Status: AC
  Filled 2013-05-07: qty 0.83

## 2013-05-07 MED ORDER — SODIUM CHLORIDE 0.9 % IV SOLN
1000.0000 mL | INTRAVENOUS | Status: DC
  Administered 2013-05-07: 1000 mL via INTRAVENOUS

## 2013-05-07 MED ORDER — METOPROLOL SUCCINATE ER 25 MG PO TB24
12.5000 mg | ORAL_TABLET | Freq: Every morning | ORAL | Status: DC
Start: 2013-05-08 — End: 2013-05-09
  Administered 2013-05-08 – 2013-05-09 (×2): 12.5 mg via ORAL
  Filled 2013-05-07 (×2): qty 1

## 2013-05-07 MED ORDER — NYSTATIN 100000 UNIT/GM EX POWD
Freq: Three times a day (TID) | CUTANEOUS | Status: DC
Start: 2013-05-07 — End: 2013-05-09
  Administered 2013-05-07 – 2013-05-08 (×3): via TOPICAL
  Administered 2013-05-08: 1 via TOPICAL
  Administered 2013-05-08 – 2013-05-09 (×2): via TOPICAL
  Filled 2013-05-07: qty 15

## 2013-05-07 NOTE — Progress Notes (Signed)
UR chart review completed.  

## 2013-05-07 NOTE — Progress Notes (Signed)
[name] PROGRESS NOTE  TALMA AGUILLARD ZOX:[id number] DOB: 10-08-1931 DOA: 05/06/2013 PCP: [name], MD Malissa Hippo, MD   Assessment/Plan: 1. Recurrent hyponatremia: Suspect hypovolemic hyponatremia by history, possibly secondary to vomiting induced by Wellbutrin. Responding well to fluid restriction and isotonic saline. Last TSH 01/2013 was normal. Hyponatremia was in 12/2012, 11/2012. 2. Acute encephalopathy: Secondary most likely to hyponatremia. Resolved at this point. 3. Nausea and vomiting: Multifactorial, long-standing, including gastroparesis. Not a candidate for Reglan by chart review. Likely exacerbated by hyponatremia, increased Wellbutrin dose, possibly resumption of Synthroid. Outpatient gastroenterology evaluation ongoing. Outpatient endocrine evaluation is pending. Cortisol was equivocal on previous testing. 4. Dehydration: Resolving with isotonic saline. 5. Gastroparesis: Continue gastroparesis diet, low residue. 6. Diabetes mellitus type 2: Currently with hypoglycemia secondary to n.p.o. status. Treat hypoglycemia, continue sliding scale insulin, advance diet. 7. Chronic atrial fibrillation on warfarin with slow ventricular response: Decrease dose of metoprolol. Set hold parameters. Warfarin per pharmacy. 8. Status post aVR with bioprosthetic device 9. Hypothyroidism: Last TSH 5/24 within normal limits. Patient has not been taking Synthroid until just recently.  Overall her history is most suggestive of decompensation secondary to change in Wellbutrin (this has been described by the patient in the past as well). She is responding to isotonic saline. Acute encephalopathy has resolved. Followup basic metabolic panel the morning. Anticipate discharge home next 48 hours if continues to improve. She will need to consider an alternative antidepressant as depression is a major issue for her. She will need to reschedule followup with endocrinology for further assessment of  alternative treatments for hypothyroidism and evaluation of her cortisol.   Check TSH  Repeat basic metabolic panel now,  Hold IV fluids for now,advance diet  Stop Wellbutrin and Synthroid  Modify metoprolol dosing and parameters  Treat hypoglycemia, advance diet  Transfer to telemetry  Pending studies:   Serum osmolality, urine osmolality  TSH  Code Status: Full code DVT prophylaxis: Warfarin Family Communication: Discussed above in detail with daughter Disposition Plan: Home when improved  Brendia Sacks, MD  [name]  Pager [phone] If 7PM-7AM, please contact night-coverage at [email or web address], password [email or web address] 05/07/2013, 8:12 AM  LOS: 1 day   Clinical Summary: 79 year old woman presented to the emergency department with several day history of nausea, vomiting and some confusion.  Consultants:    Procedures:    HPI/Subjective: Feels better today. Vomiting appears to be resolving. No issues. Patient and family report she was doing quite well at home, consuming ginger tea before each meal without vomiting. Her Wellbutrin dose was recently increased and she was recently restarted on Synthroid. She attributes nausea and vomiting 2 both these medications.  Objective: Filed Vitals:   05/07/13 0400 05/07/13 0500 05/07/13 0600 05/07/13 0738  BP: 137/118     Pulse: 41 43 50   Temp: 97 F (36.1 C)   97.8 F (36.6 C)  TempSrc: Oral   Oral  Resp: 15 9 16    Height:      Weight:  91 kg (200 lb 9.9 oz)    SpO2: 85% 92% 92%     Intake/Output Summary (Last 24 hours) at 05/07/13 0812 Last data filed at 05/07/13 0800  Gross per 24 hour  Intake 1578.75 ml  Output   1950 ml  Net -371.25 ml     Filed Weights   05/06/13 1834 05/06/13 2124 05/07/13 0500  Weight: 90.719 kg (200 lb) 90.5 kg (199 lb 8.3 oz) 91 kg (200 lb 9.9 oz)    Exam:  Afebrile, vital signs stable  General: Appears calm and comfortable.  Psychiatric: Speech fluent and clear. Oriented  to self, location, month, year. Mood and affect appear appropriate.  Cardiovascular: Bradycardic, irregular. No murmur, rub, gallop. No lower extremity edema.  Telemetry: Atrial fibrillation, bradycardia.  Respiratory: Clear to auscultation bilaterally. No wheezes, rales, rhonchi. Normal respiratory effort.  Abdomen: Soft, nontender, nondistended.  Skin: Appears grossly unremarkable.  Data Reviewed:  Basic metabolic panel: Sodium now 124. Chloride increased 90. Glucose 66.   CBC unremarkable.  INR 2  2-D echocardiogram 12/2012: Left ventricular ejection fraction 65-70%. Systolic function of the ureters.   Scheduled Meds: . amLODipine  5 mg Oral Daily  . buPROPion  75 mg Oral Daily  . insulin aspart  0-9 Units Subcutaneous TID WC  . levothyroxine  50 mcg Oral QAC breakfast  . metoprolol succinate  25 mg Oral q morning - 10a  . pantoprazole (PROTONIX) IV  40 mg Intravenous Q24H  . simvastatin  20 mg Oral QHS  . Warfarin - Pharmacist Dosing Inpatient   Does not apply Q24H   Continuous Infusions: . sodium chloride    . sodium chloride 75 mL/hr at 05/07/13 0800    Principal Problem:   Hyponatremia Active Problems:   DIABETES MELLITUS, TYPE II   Atrial fibrillation, chronic   Aortic valve disease   Depression   Hypothyroidism   Gastroparesis   Nausea and vomiting   Dehydration   Acute encephalopathy   Time spent 25 minutes

## 2013-05-07 NOTE — Progress Notes (Signed)
ANTICOAGULATION CONSULT NOTE - Initial Consult  Pharmacy Consult for Coumadin (chronic PTA) Indication: atrial fibrillation  Allergies  Allergen Reactions  . Nsaids     Bleeding ulcer  . Codeine Nausea And Vomiting  . Adhesive (Tape) Rash    Redness, and peeling skin off.     Patient Measurements: Height: 5\' 5"  (165.1 cm) Weight: 200 lb 9.9 oz (91 kg) IBW/kg (Calculated) : 57  Vital Signs: Temp: 97.6 F (36.4 C) (08/06 1130) Temp src: Oral (08/06 1130) BP: 128/56 mmHg (08/06 0932) Pulse Rate: 51 (08/06 0800)  Labs:  Recent Labs  05/06/13 1855 05/06/13 2124 05/06/13 2312 05/07/13 0127 05/07/13 0343 05/07/13 0931 05/07/13 1141  HGB 15.1*  --   --   --  13.2  --   --   HCT 53.5*  --   --   --  36.5  --   --   PLT 317  --   --   --  261  --   --   LABPROT  --   --  22.1*  --   --   --  25.2*  INR  --   --  2.00*  --   --   --  2.38*  CREATININE 0.55 0.49*  --  0.47* 0.47*  --  0.48*  TROPONINI <0.30 <0.30  --   --  <0.30 <0.30  --     Estimated Creatinine Clearance: 63.6 ml/min (by C-G formula based on Cr of 0.48).   Medical History: Past Medical History  Diagnosis Date  . Diabetes mellitus   . Hyperlipidemia   . Eosinophilia   . Cataract   . Hypertension   . Atrial fibrillation prior to 2008    moderate biatrial enlargement in 2009; mild to moderate LVH with a low-normal EF  . GERD (gastroesophageal reflux disease)     Hemorrhoids; history of peptic ulcer disease  . DJD (degenerative joint disease) of knee   . Sleep apnea     CPAP  . Chronic anticoagulation   . Aortic valve disease     2009: mild stenosis and regurgitation; peak gradient of 30-35 mmHg , subsequent AVR at [facility]  . Incontinence     Mild  . Carcinoma of breast     Right mastectomy; radiation and hormonal therapy  . Hip fracture, left 12/12/2012  . Foot injury 06/25/2012  . Degenerative joint disease of knee 01/29/2008    Bilateral    . BREAST CANCER, HX OF 01/29/2008    Lumpectomy,  radiation therapy  Arimidex stopped in July 2013    . Syncope and collapse 11/11/2012  . Diverticulosis   . Umbilical hernia   . Cholelithiasis   . DDD (degenerative disc disease), lumbar   . Vertebral compression fracture     lumbar  . Chronic pain   . Paresthesias     feet    Medications:  Prescriptions prior to admission  Medication Sig Dispense Refill  . acetaminophen (TYLENOL) 650 MG CR tablet Take 1 tablet (650 mg total) by mouth every 8 (eight) hours as needed for pain.      Marland Kitchen albuterol (PROVENTIL HFA;VENTOLIN HFA) 108 (90 BASE) MCG/ACT inhaler Inhale 2 puffs into the lungs every 4 (four) hours as needed for wheezing.  1 Inhaler  2  . albuterol (PROVENTIL) (2.5 MG/3ML) 0.083% nebulizer solution Take 3 mLs (2.5 mg total) by nebulization every 6 (six) hours as needed for wheezing.  75 mL  3  . amLODipine (NORVASC) 5 MG  tablet Take 5 mg by mouth daily.      Marland Kitchen buPROPion (WELLBUTRIN) 75 MG tablet Take 1 tablet (75 mg total) by mouth 2 (two) times daily.  60 tablet  3  . clotrimazole-betamethasone (LOTRISONE) cream Apply 1 application topically 2 (two) times daily.      . diclofenac sodium (VOLTAREN) 1 % GEL Apply 1 application topically daily as needed (for knee pain).      Marland Kitchen glimepiride (AMARYL) 1 MG tablet Take 1 mg by mouth every morning.      Marland Kitchen HYDROcodone-acetaminophen (NORCO) 5-325 MG per tablet Take 1 tablet by mouth every 6 (six) hours as needed for pain.  60 tablet  3  . levothyroxine (SYNTHROID, LEVOTHROID) 50 MCG tablet Take 1 tablet (50 mcg total) by mouth daily. To take 30 minutes prior to all other medications  30 tablet  3  . metoprolol succinate (TOPROL-XL) 25 MG 24 hr tablet Take 25 mg by mouth every morning.      Marland Kitchen omeprazole (PRILOSEC) 20 MG capsule Take 20 mg by mouth daily.      Marland Kitchen PRESCRIPTION MEDICATION Take 10 mg by mouth 2 (two) times daily. MOTILIUM: Domperidone 10 mg      . simvastatin (ZOCOR) 20 MG tablet Take 1 tablet (20 mg total) by mouth at bedtime.  30  tablet  6  . warfarin (COUMADIN) 5 MG tablet Take 5-7.5 mg by mouth See admin instructions. Take one tablet daily except take one & one-half tablet on Tuesdays and Fridays.        Assessment: 79yo female on chronic Coumadin for h/o afib.  Home dose listed above.  INR is therapeutic on admission.  Goal of Therapy:  INR 2-3 Monitor platelets by anticoagulation protocol: Yes   Plan:  Coumadin 5mg  po today x 1 (home dose) INR daily  Valrie Hart A 05/07/2013,2:23 PM

## 2013-05-07 NOTE — Progress Notes (Signed)
Inpatient Diabetes Program Recommendations  AACE/ADA: New Consensus Statement on Inpatient Glycemic Control (2013)  Target Ranges:  Prepandial:   less than 140 mg/dL      Peak postprandial:   less than 180 mg/dL (1-2 hours)      Critically ill patients:  140 - 180 mg/dL  Results for Kristin Logan, Kristin Logan (MRN [id number]) as of 05/07/2013 08:59  Ref. Range 01/14/2013 22:15  Hemoglobin A1C Latest Range: <5.7 % 5.8 (H)   Results for Kristin Logan, Kristin Logan (MRN [id number]) as of 05/07/2013 08:59  Ref. Range 05/06/2013 21:49 05/07/2013 01:34 05/07/2013 07:20 05/07/2013 08:13  Glucose-Capillary Latest Range: 70-99 mg/dL 72 79 60 (L) 61 (L)    Inpatient Diabetes Program Recommendations IV Fluids : May want to add dextrose to IVF to prevent hypoglycemia.  Note: Patient has a history of diabetes and takes Amaryl 1mg  QAM at home for diabetes management.  Currently, patient is ordered to receive Novolog 0-9 units AC for inpatient glycemic control.  However, patient has not received any insulin since being admitted.  Patient is NPO and blood glucose noted to be 60 mg/dl this morning.  If patient is to remain NPO, may want to add dextrose to IVF.  Will continue to follow.  Thanks, Orlando Penner, RN, MSN, CCRN Diabetes Coordinator Inpatient Diabetes Program [phone]

## 2013-05-07 NOTE — Care Management Note (Signed)
    Page 1 of 2   05/09/2013     1:54:03 PM   CARE MANAGEMENT NOTE 05/09/2013  Patient:  VANNESA, ABAIR   Account Number:  [id number]  Date Initiated:  05/07/2013  Documentation initiated by:  Sharrie Rothman  Subjective/Objective Assessment:   Pt admitted from home with hyponatremia. Pt lives with her son and his wife. Pt has a walker, BSC, and cpap for home. Pt plans to return home with son at discharge. Pt has CAP aide M-F 3hours a day.     Action/Plan:   Pay would benefit from [facility] at discharge. WIll continue to follow.   Anticipated DC Date:  05/10/2013   Anticipated DC Plan:  HOME W HOME HEALTH SERVICES      DC Planning Services  CM consult      [facility] Choice  HOME HEALTH   Choice offered to / List presented to:  C-1 Patient        HH arranged  HH-1 RN      [facility] agency  [facility]   Status of service:  Completed, signed off Medicare Important Message given?  YES (If response is "NO", the following Medicare IM given date fields will be blank) Date Medicare IM given:  05/09/2013 Date Additional Medicare IM given:    Discharge Disposition:  HOME W HOME HEALTH SERVICES  Per UR Regulation:    If discussed at Long Length of Stay Meetings, dates discussed:    Comments:  05/09/13 1350 Arlyss Queen, RN BSN CM Pt discharged home today with [facility] RN and resumption of CAP aide with [place]. Judeth Cornfield of [facility] is aware of referral and will collect the pts information from the chart. HH services to start within 48 hours of discharge. Pt and pts nurse aware of discharge arrangements.  05/07/13 1530 Arlyss Queen, RN BSN CM

## 2013-05-07 NOTE — Progress Notes (Signed)
INITIAL NUTRITION ASSESSMENT  DOCUMENTATION CODES Per approved criteria  -Not Applicable   INTERVENTION:  Ensure Complete po BID, each supplement provides 350 kcal and 13 grams of protein.  Alternate Resource Breeze po daily each supplement provides 250 kcal and 9 grams of protein.  NUTRITION DIAGNOSIS: Inadequate oral intake related to altered GI function as evidenced by nausea and vomiting, dehydration on admission   Goal: Pt to meet >/= 90% of their estimated nutrition needs   Monitor:  Po intake, labs and wt trends  Reason for Assessment: malnutrition screen score = 5   79 y.o. female  Admitting Dx: Hyponatremia-improving  ASSESSMENT: Pt resting and family present and very involved her care. She follows a low fiber diet at home due to gastroparesis per family. She has been vomiting prior to admission reportedly associated with Wellbutrin medication. Hx of recurrent hyponatremia, vomiting and poor appetite. She is currently 95% usual body weight which is not significant. Pt is tolerating Low Fiber diet with 1200 ml fluid restriction. Poor po intake 25%. Will add oral supplement 120 ml TID and increase as fluid restriction is liberalized.  Pt at risk for malnutrition given her hx of vomiting, poor appetite and chronic dz state.  Height: Ht Readings from Last 1 Encounters:  05/06/13 5\' 5"  (1.651 m)    Weight: Wt Readings from Last 1 Encounters:  05/07/13 200 lb 9.9 oz (91 kg)    Ideal Body Weight: 125# (56.8kg)  % Ideal Body Weight: 160%  Wt Readings from Last 10 Encounters:  05/07/13 200 lb 9.9 oz (91 kg)  03/31/13 201 lb (91.173 kg)  02/20/13 194 lb 10.7 oz (88.3 kg)  02/20/13 194 lb 10.7 oz (88.3 kg)  02/10/13 195 lb (88.451 kg)  01/30/13 207 lb (93.895 kg)  01/21/13 216 lb 0.8 oz (98 kg)  12/31/12 220 lb (99.791 kg)  12/27/12 211 lb (95.709 kg)  12/25/12 220 lb (99.791 kg)    Usual Body Weight: 210-220##  % Usual Body Weight: 95%  BMI:  Body mass  index is 33.38 kg/(m^2).obesity class I  Estimated Nutritional Needs: Kcal: 1400-1600 Protein: 60-70 gr Fluid: 1200 ml daily per MD  Skin: No issues noted  Diet Order: Fiber Restricted --Food preferences obtained.  EDUCATION NEEDS: -Education needs addressed   Intake/Output Summary (Last 24 hours) at 05/07/13 1446 Last data filed at 05/07/13 1222  Gross per 24 hour  Intake 2253.75 ml  Output   1950 ml  Net 303.75 ml    Last BM: 05/06/13  Labs:   Recent Labs Lab 05/07/13 0127 05/07/13 0343 05/07/13 1141  NA 117* 124* 124*  K 3.7 3.3* 3.6  CL 85* 90* 91*  CO2 20 22 23   BUN 3* 3* 3*  CREATININE 0.47* 0.47* 0.48*  CALCIUM 9.1 8.9 8.9  GLUCOSE 74 66* 88    CBG (last 3)   Recent Labs  05/07/13 0720 05/07/13 0813 05/07/13 1124  GLUCAP 60* 61* 89    Scheduled Meds: . amLODipine  5 mg Oral Daily  . dextrose      . insulin aspart  0-9 Units Subcutaneous TID WC  . [START ON 05/08/2013] metoprolol succinate  12.5 mg Oral q morning - 10a  . pantoprazole  40 mg Oral Daily  . simvastatin  20 mg Oral QHS  . warfarin  5 mg Oral Once  . Warfarin - Pharmacist Dosing Inpatient   Does not apply Q24H    Continuous Infusions:   Past Medical History  Diagnosis Date  .  Diabetes mellitus   . Hyperlipidemia   . Eosinophilia   . Cataract   . Hypertension   . Atrial fibrillation prior to 2008    moderate biatrial enlargement in 2009; mild to moderate LVH with a low-normal EF  . GERD (gastroesophageal reflux disease)     Hemorrhoids; history of peptic ulcer disease  . DJD (degenerative joint disease) of knee   . Sleep apnea     CPAP  . Chronic anticoagulation   . Aortic valve disease     2009: mild stenosis and regurgitation; peak gradient of 30-35 mmHg , subsequent AVR [facility]  . Incontinence     Mild  . Carcinoma of breast     Right mastectomy; radiation and hormonal therapy  . Hip fracture, left 12/12/2012  . Foot injury 06/25/2012  . Degenerative joint disease  of knee 01/29/2008    Bilateral    . BREAST CANCER, HX OF 01/29/2008    Lumpectomy, radiation therapy  Arimidex stopped in July 2013    . Syncope and collapse 11/11/2012  . Diverticulosis   . Umbilical hernia   . Cholelithiasis   . DDD (degenerative disc disease), lumbar   . Vertebral compression fracture     lumbar  . Chronic pain   . Paresthesias     feet    Past Surgical History  Procedure Laterality Date  . Exploration post operative open heart    . Colonoscopy      Approximately 2000  . Cardiac valve replacement      [facility]  . Cardiac valve replacement    . Breast surgery    . Breast lumpectomy    . Esophagogastroduodenoscopy N/A 02/20/2013    Procedure: ESOPHAGOGASTRODUODENOSCOPY (EGD);  Surgeon: Malissa Hippo, MD;  Location: [facility];  Service: Endoscopy;  Laterality: N/A;    Royann Shivers MS,RD,LDN,CSG Office: [phone] Pager: [phone]

## 2013-05-07 NOTE — Plan of Care (Signed)
Problem: Phase I Progression Outcomes Goal: EF % per last Echo/documented,Core Reminder form on chart Ejection Fraction was 65-70% on 01/15/13

## 2013-05-08 DIAGNOSIS — E119 Type 2 diabetes mellitus without complications: Secondary | ICD-10-CM

## 2013-05-08 LAB — PROTIME-INR: INR: 2.77 — ABNORMAL HIGH (ref 0.00–1.49)

## 2013-05-08 LAB — GLUCOSE, CAPILLARY
Glucose-Capillary: 103 mg/dL — ABNORMAL HIGH (ref 70–99)
Glucose-Capillary: 95 mg/dL (ref 70–99)

## 2013-05-08 LAB — BASIC METABOLIC PANEL
CO2: 24 mEq/L (ref 19–32)
Calcium: 8.8 mg/dL (ref 8.4–10.5)
Chloride: 97 mEq/L (ref 96–112)
Creatinine, Ser: 0.52 mg/dL (ref 0.50–1.10)
GFR calc Af Amer: >90 mL/min (ref >90)
Glucose, Bld: 78 mg/dL (ref 70–99)
Sodium: 128 mEq/L — ABNORMAL LOW (ref 135–145)

## 2013-05-08 LAB — OSMOLALITY, URINE: Osmolality, Ur: 293 mOsm/kg — ABNORMAL LOW (ref 390–1090)

## 2013-05-08 MED ORDER — SODIUM CHLORIDE 0.9 % IV SOLN
INTRAVENOUS | Status: DC
  Filled 2013-05-08: qty 1000

## 2013-05-08 MED ORDER — GLUCERNA SHAKE PO LIQD
237.0000 mL | Freq: Two times a day (BID) | ORAL | Status: DC
  Administered 2013-05-09: 237 mL via ORAL

## 2013-05-08 MED ORDER — WARFARIN SODIUM 5 MG PO TABS
5.0000 mg | ORAL_TABLET | Freq: Once | ORAL | Status: AC
  Administered 2013-05-08: 5 mg via ORAL
  Filled 2013-05-08: qty 1

## 2013-05-08 NOTE — Plan of Care (Signed)
Problem: Consults Goal: General Medical Patient Education See Patient Education Module for specific education. Outcome: Progressing Hyponatremia resolving, admitted with Sodium of 116 presently 124, will recheck in am Goal: Nutrition Consult-if indicated Outcome: Progressing Nausea resolving.  Appetite good today. Ate 3 meals Goal: Diabetes Guidelines if Diabetic/Glucose > 140 If diabetic or lab glucose is > 140 mg/dl - Initiate Diabetes/Hyperglycemia Guidelines & Document Interventions  Outcome: Progressing Blood sugars remained WNL this evening.  Problem: Phase I Progression Outcomes Goal: Pain controlled with appropriate interventions Outcome: Progressing Patient has arthritic pain.  Goal: OOB as tolerated unless otherwise ordered Outcome: Progressing Patient oob to bsc with 2 person assist tonight.  Very weak

## 2013-05-08 NOTE — Plan of Care (Signed)
Problem: Phase I Progression Outcomes Goal: Hemodynamically stable Outcome: Progressing Vitals remain within normal limits for this patient.  Elevated to med surg today,  Goal: Other Phase I Outcomes/Goals Outcome: Progressing Patient tearful, states she is depressed, then complained of pain.  Pain issue resolved and patient in somewhat of a better mood. Complains that she can barely use her legs anymore. 2 person assist to bedside commode

## 2013-05-08 NOTE — Progress Notes (Signed)
Patient tearful at onset of shift, stating she is very depressed, family at bedside to console. Then stated she was aching in her legs and left upper arm.  Chronic arthritic pain per family.  Vicodin 1 tab given PO.  Patient tearful that she can not use her legs as well anymore.   Rated her pain 7 of 10

## 2013-05-08 NOTE — Progress Notes (Signed)
ANTICOAGULATION CONSULT NOTE   Pharmacy Consult for Coumadin (chronic PTA) Indication: atrial fibrillation  Allergies  Allergen Reactions  . Nsaids     Bleeding ulcer  . Codeine Nausea And Vomiting  . Adhesive (Tape) Rash    Redness, and peeling skin off.    Patient Measurements: Height: 5\' 5"  (165.1 cm) Weight: 200 lb 2.8 oz (90.8 kg) IBW/kg (Calculated) : 57  Vital Signs: Temp: 97.7 F (36.5 C) (08/07 0730) Temp src: Oral (08/07 0730) BP: 101/46 mmHg (08/07 0800) Pulse Rate: 65 (08/07 0951)  Labs:  Recent Labs  05/06/13 1855 05/06/13 2124 05/06/13 2312  05/07/13 0343 05/07/13 0931 05/07/13 1141 05/08/13 0450  HGB 15.1*  --   --   --  13.2  --   --   --   HCT 53.5*  --   --   --  36.5  --   --   --   PLT 317  --   --   --  261  --   --   --   LABPROT  --   --  22.1*  --   --   --  25.2* 28.3*  INR  --   --  2.00*  --   --   --  2.38* 2.77*  CREATININE 0.55 0.49*  --   < > 0.47*  --  0.48* 0.52  TROPONINI <0.30 <0.30  --   --  <0.30 <0.30  --   --   < > = values in this interval not displayed.  Estimated Creatinine Clearance: 63.5 ml/min (by C-G formula based on Cr of 0.52).  Medical History: Past Medical History  Diagnosis Date  . Diabetes mellitus   . Hyperlipidemia   . Eosinophilia   . Cataract   . Hypertension   . Atrial fibrillation prior to 2008    moderate biatrial enlargement in 2009; mild to moderate LVH with a low-normal EF  . GERD (gastroesophageal reflux disease)     Hemorrhoids; history of peptic ulcer disease  . DJD (degenerative joint disease) of knee   . Sleep apnea     CPAP  . Chronic anticoagulation   . Aortic valve disease     2009: mild stenosis and regurgitation; peak gradient of 30-35 mmHg , subsequent AVR at [facility]  . Incontinence     Mild  . Carcinoma of breast     Right mastectomy; radiation and hormonal therapy  . Hip fracture, left 12/12/2012  . Foot injury 06/25/2012  . Degenerative joint disease of knee 01/29/2008   Bilateral    . BREAST CANCER, HX OF 01/29/2008    Lumpectomy, radiation therapy  Arimidex stopped in July 2013    . Syncope and collapse 11/11/2012  . Diverticulosis   . Umbilical hernia   . Cholelithiasis   . DDD (degenerative disc disease), lumbar   . Vertebral compression fracture     lumbar  . Chronic pain   . Paresthesias     feet   Medications:  Prescriptions prior to admission  Medication Sig Dispense Refill  . acetaminophen (TYLENOL) 650 MG CR tablet Take 1 tablet (650 mg total) by mouth every 8 (eight) hours as needed for pain.      Marland Kitchen albuterol (PROVENTIL HFA;VENTOLIN HFA) 108 (90 BASE) MCG/ACT inhaler Inhale 2 puffs into the lungs every 4 (four) hours as needed for wheezing.  1 Inhaler  2  . albuterol (PROVENTIL) (2.5 MG/3ML) 0.083% nebulizer solution Take 3 mLs (2.5 mg total) by nebulization  every 6 (six) hours as needed for wheezing.  75 mL  3  . amLODipine (NORVASC) 5 MG tablet Take 5 mg by mouth daily.      Marland Kitchen buPROPion (WELLBUTRIN) 75 MG tablet Take 1 tablet (75 mg total) by mouth 2 (two) times daily.  60 tablet  3  . clotrimazole-betamethasone (LOTRISONE) cream Apply 1 application topically 2 (two) times daily.      . diclofenac sodium (VOLTAREN) 1 % GEL Apply 1 application topically daily as needed (for knee pain).      Marland Kitchen glimepiride (AMARYL) 1 MG tablet Take 1 mg by mouth every morning.      Marland Kitchen HYDROcodone-acetaminophen (NORCO) 5-325 MG per tablet Take 1 tablet by mouth every 6 (six) hours as needed for pain.  60 tablet  3  . levothyroxine (SYNTHROID, LEVOTHROID) 50 MCG tablet Take 1 tablet (50 mcg total) by mouth daily. To take 30 minutes prior to all other medications  30 tablet  3  . metoprolol succinate (TOPROL-XL) 25 MG 24 hr tablet Take 25 mg by mouth every morning.      Marland Kitchen omeprazole (PRILOSEC) 20 MG capsule Take 20 mg by mouth daily.      Marland Kitchen PRESCRIPTION MEDICATION Take 10 mg by mouth 2 (two) times daily. MOTILIUM: Domperidone 10 mg      . simvastatin (ZOCOR) 20 MG  tablet Take 1 tablet (20 mg total) by mouth at bedtime.  30 tablet  6  . warfarin (COUMADIN) 5 MG tablet Take 5-7.5 mg by mouth See admin instructions. Take one tablet daily except take one & one-half tablet on Tuesdays and Fridays.       Assessment: 79yo female on chronic Coumadin for h/o afib.  Home dose listed above.  INR is therapeutic on admission.  Goal of Therapy:  INR 2-3 Monitor platelets by anticoagulation protocol: Yes   Plan:  Coumadin 5mg  po today x 1 (home dose) INR daily  Valrie Hart A 05/08/2013,11:01 AM

## 2013-05-08 NOTE — Progress Notes (Signed)
[name] PROGRESS NOTE  Kristin Logan NWG:[id number] DOB: Jan 15, 1932 DOA: 05/06/2013 PCP: [name], MD Malissa Hippo, MD   Assessment/Plan: 1. Recurrent hyponatremia: Suspect hypovolemic hyponatremia by history, possibly secondary to vomiting induced by Wellbutrin. Continues to improve with free water restriction and isotonic saline. TSH normal. This is a recurrent issue. 2. Acute encephalopathy: Resolved. Secondary to hyponatremia. 3. Nausea and vomiting: Resolved. Multifactorial, long-standing, including gastroparesis. Not a candidate for Reglan by chart review. Likely exacerbated by hyponatremia, Wellbutrin. Outpatient GI evaluation ongoing. Outpatient endocrine evaluation is pending. Cortisol was equivocal on previous testing. 4. Dehydration: Resolved. 5. Gastroparesis: Stable. 6. Diabetes mellitus type 2: Mild hypoglycemia. Follow diet closely. Should improve his oral intake improves. 7. Chronic atrial flutter relation on warfarin: Stable. Slow ventricular response, continue current dosing of metoprolol. Continue warfarin. 8. Hypothyroidism: Stable. 9. Status post AVR with bioprosthetic device  Overall her history is most suggestive of decompensation secondary to change in Wellbutrin (this has been described by the patient in the past as well). She is responding to isotonic saline. Acute encephalopathy has resolved. Followup basic metabolic panel the morning. Anticipate discharge home next 48 hours if continues to improve. She will need to consider an alternative antidepressant as depression is a major issue for her. She will need to reschedule followup with endocrinology for further assessment of alternative treatments for hypothyroidism and evaluation of her cortisol.   Will not restart Wellbutrin and Synthroid  Treat hypoglycemia, advance diet  Transfer to medical bed resolved.   Repeat basic metabolic panel morning. Likely discharge 8/8  Pending studies:    None  Code Status: Full code DVT prophylaxis: Warfarin Family Communication:  Disposition Plan: as above  Brendia Sacks, MD  [name]  Pager [phone] If 7PM-7AM, please contact night-coverage at [email or web address], password [facility] 05/08/2013, 9:21 AM  LOS: 2 days   Clinical Summary: 79 year old woman presented to the emergency department with several day history of nausea, vomiting and some confusion.  Consultants:    Procedures:    HPI/Subjective: Feels better today. No vomiting. Eating breakfast. No new issues. Discussed with RN--doing well.  Objective: Filed Vitals:   05/08/13 0000 05/08/13 0400 05/08/13 0500 05/08/13 0535  BP: 111/71   122/55  Pulse: 54   55  Temp: 98.3 F (36.8 C) 98 F (36.7 C)    TempSrc: Oral Oral    Resp: 14   16  Height:      Weight:   90.8 kg (200 lb 2.8 oz)   SpO2: 95%   96%    Intake/Output Summary (Last 24 hours) at 05/08/13 0921 Last data filed at 05/08/13 0500  Gross per 24 hour  Intake 813.75 ml  Output   1050 ml  Net -236.25 ml     Filed Weights   05/06/13 2124 05/07/13 0500 05/08/13 0500  Weight: 90.5 kg (199 lb 8.3 oz) 91 kg (200 lb 9.9 oz) 90.8 kg (200 lb 2.8 oz)    Exam:   Afebrile, vital signs stable  General: Appears calm and comfortable.  Psychiatric: Grossly normal mood and affect. Speech fluent and appropriate.  Cardiovascular: Regular rate and rhythm. No murmur, rub, gallop.  Respiratory: Clear to auscultation bilaterally. No wheezes, rales, rhonchi. Normal respiratory effort.  Abdomen: Soft, nontender, nondistended.  Data Reviewed:  Excellent oral intake  Basic metabolic panel: Potassium 3.3. Sodium improved to 128. Hypoglycemic 67. INR 2.77.   Scheduled Meds: . amLODipine  5 mg Oral Daily  . feeding supplement  237 mL Oral BID  BM  . feeding supplement  1 Container Oral Q1200  . insulin aspart  0-9 Units Subcutaneous TID WC  . metoprolol succinate  12.5 mg Oral q morning - 10a  .  nystatin   Topical TID  . pantoprazole  40 mg Oral Daily  . simvastatin  20 mg Oral QHS  . Warfarin - Pharmacist Dosing Inpatient   Does not apply Q24H   Continuous Infusions: . sodium chloride 75 mL/hr at 05/08/13 0500    Principal Problem:   Hyponatremia Active Problems:   DIABETES MELLITUS, TYPE II   Atrial fibrillation, chronic   Aortic valve disease   Depression   Hypothyroidism   Gastroparesis   Nausea and vomiting   Dehydration   Acute encephalopathy   Time spent 20 minutes

## 2013-05-09 LAB — BASIC METABOLIC PANEL
Calcium: 8.6 mg/dL (ref 8.4–10.5)
Chloride: 99 mEq/L (ref 96–112)
Creatinine, Ser: 0.57 mg/dL (ref 0.50–1.10)
GFR calc Af Amer: >90 mL/min (ref >90)
GFR calc non Af Amer: 86 mL/min — ABNORMAL LOW (ref >90)

## 2013-05-09 LAB — PROTIME-INR
INR: 2.8 — ABNORMAL HIGH (ref 0.00–1.49)
Prothrombin Time: 28.5 seconds — ABNORMAL HIGH (ref 11.6–15.2)

## 2013-05-09 LAB — GLUCOSE, CAPILLARY: Glucose-Capillary: 80 mg/dL (ref 70–99)

## 2013-05-09 MED ORDER — METOPROLOL SUCCINATE ER 25 MG PO TB24: 12.5000 mg | ORAL_TABLET | Freq: Every morning | ORAL | Status: DC

## 2013-05-09 MED ORDER — POTASSIUM CHLORIDE CRYS ER 20 MEQ PO TBCR: 40.0000 meq | EXTENDED_RELEASE_TABLET | Freq: Once | ORAL | Status: DC

## 2013-05-09 MED ORDER — WARFARIN SODIUM 2.5 MG PO TABS: 2.5000 mg | ORAL_TABLET | Freq: Once | ORAL | Status: DC

## 2013-05-09 NOTE — Progress Notes (Signed)
Discharge instructions given to patient and patients daughters.  Verbalized understanding of discharge instructions.  No acute distress noted.  Discontinued IV. Out via wheelchair to awaiting vehicle for discharge home.

## 2013-05-09 NOTE — Progress Notes (Signed)
ANTICOAGULATION CONSULT NOTE   Pharmacy Consult for Coumadin  Indication: atrial fibrillation  Allergies  Allergen Reactions  . Nsaids     Bleeding ulcer  . Codeine Nausea And Vomiting  . Adhesive (Tape) Rash    Redness, and peeling skin off.    Patient Measurements: Height: 5\' 5"  (165.1 cm) Weight: 203 lb 11.3 oz (92.4 kg) IBW/kg (Calculated) : 57  Vital Signs: Temp: 98.1 F (36.7 C) (08/07 2038) Temp src: Oral (08/07 2038) BP: 124/66 mmHg (08/07 2038) Pulse Rate: 50 (08/07 2038)  Labs:  Recent Labs  05/06/13 1855 05/06/13 2124  05/07/13 0343 05/07/13 0931 05/07/13 1141 05/08/13 0450 05/09/13 0457  HGB 15.1*  --   --  13.2  --   --   --   --   HCT 53.5*  --   --  36.5  --   --   --   --   PLT 317  --   --  261  --   --   --   --   LABPROT  --   --   < >  --   --  25.2* 28.3* 28.5*  INR  --   --   < >  --   --  2.38* 2.77* 2.80*  CREATININE 0.55 0.49*  < > 0.47*  --  0.48* 0.52 0.57  TROPONINI <0.30 <0.30  --  <0.30 <0.30  --   --   --   < > = values in this interval not displayed.  Estimated Creatinine Clearance: 64.1 ml/min (by C-G formula based on Cr of 0.57).  Medical History: Past Medical History  Diagnosis Date  . Diabetes mellitus   . Hyperlipidemia   . Eosinophilia   . Cataract   . Hypertension   . Atrial fibrillation prior to 2008    moderate biatrial enlargement in 2009; mild to moderate LVH with a low-normal EF  . GERD (gastroesophageal reflux disease)     Hemorrhoids; history of peptic ulcer disease  . DJD (degenerative joint disease) of knee   . Sleep apnea     CPAP  . Chronic anticoagulation   . Aortic valve disease     2009: mild stenosis and regurgitation; peak gradient of 30-35 mmHg , subsequent AVR at [facility]  . Incontinence     Mild  . Carcinoma of breast     Right mastectomy; radiation and hormonal therapy  . Hip fracture, left 12/12/2012  . Foot injury 06/25/2012  . Degenerative joint disease of knee 01/29/2008    Bilateral    .  BREAST CANCER, HX OF 01/29/2008    Lumpectomy, radiation therapy  Arimidex stopped in July 2013    . Syncope and collapse 11/11/2012  . Diverticulosis   . Umbilical hernia   . Cholelithiasis   . DDD (degenerative disc disease), lumbar   . Vertebral compression fracture     lumbar  . Chronic pain   . Paresthesias     feet   Medications:  Prescriptions prior to admission  Medication Sig Dispense Refill  . acetaminophen (TYLENOL) 650 MG CR tablet Take 1 tablet (650 mg total) by mouth every 8 (eight) hours as needed for pain.      Marland Kitchen albuterol (PROVENTIL HFA;VENTOLIN HFA) 108 (90 BASE) MCG/ACT inhaler Inhale 2 puffs into the lungs every 4 (four) hours as needed for wheezing.  1 Inhaler  2  . albuterol (PROVENTIL) (2.5 MG/3ML) 0.083% nebulizer solution Take 3 mLs (2.5 mg total) by nebulization every  6 (six) hours as needed for wheezing.  75 mL  3  . amLODipine (NORVASC) 5 MG tablet Take 5 mg by mouth daily.      Marland Kitchen buPROPion (WELLBUTRIN) 75 MG tablet Take 1 tablet (75 mg total) by mouth 2 (two) times daily.  60 tablet  3  . clotrimazole-betamethasone (LOTRISONE) cream Apply 1 application topically 2 (two) times daily.      . diclofenac sodium (VOLTAREN) 1 % GEL Apply 1 application topically daily as needed (for knee pain).      Marland Kitchen glimepiride (AMARYL) 1 MG tablet Take 1 mg by mouth every morning.      Marland Kitchen HYDROcodone-acetaminophen (NORCO) 5-325 MG per tablet Take 1 tablet by mouth every 6 (six) hours as needed for pain.  60 tablet  3  . levothyroxine (SYNTHROID, LEVOTHROID) 50 MCG tablet Take 1 tablet (50 mcg total) by mouth daily. To take 30 minutes prior to all other medications  30 tablet  3  . metoprolol succinate (TOPROL-XL) 25 MG 24 hr tablet Take 25 mg by mouth every morning.      Marland Kitchen omeprazole (PRILOSEC) 20 MG capsule Take 20 mg by mouth daily.      Marland Kitchen PRESCRIPTION MEDICATION Take 10 mg by mouth 2 (two) times daily. MOTILIUM: Domperidone 10 mg      . simvastatin (ZOCOR) 20 MG tablet Take 1  tablet (20 mg total) by mouth at bedtime.  30 tablet  6  . warfarin (COUMADIN) 5 MG tablet Take 5-7.5 mg by mouth See admin instructions. Take one tablet daily except take one & one-half tablet on Tuesdays and Fridays.       Assessment: 79 yo female on chronic Coumadin for h/o afib.  Home dose listed above.  INR was therapeutic on admission. It remains therapeutic but has trended to upper end of goal range during this admission.  She could have decreased warfarin needs since stopping her levothyroxine.   No bleeding noted.   Goal of Therapy:  INR 2-3   Plan:  Coumadin 2.5mg  po today x 1 (decrease from home dose) INR daily If discharge home today, recommend 5mg  po x1 today then resume previous home regimen with close outpatient follow-up.  Elson Clan 05/09/2013,8:34 AM

## 2013-05-09 NOTE — Progress Notes (Signed)
[name] PROGRESS NOTE  TAQUITA DEMBY YNW:[id number] DOB: [birth year] DOA: 05/06/2013 PCP: [name], MD Malissa Hippo, MD   Assessment/Plan: 1. Recurrent hyponatremia: Suspect hypovolemic hyponatremia by history, possibly secondary to vomiting induced by Wellbutrin. Resolved with free water restriction and isotonic saline. TSH normal.  2. Acute encephalopathy: Resolved. Secondary to hyponatremia. 3. Nausea and vomiting: Resolved. Multifactorial, long-standing, including gastroparesis. Not a candidate for Reglan by chart review. Likely exacerbated by hyponatremia, Wellbutrin. Outpatient GI evaluation ongoing. Outpatient endocrine evaluation is pending. Cortisol was equivocal on previous testing. 4. Dehydration: Resolved with IV fluids. 5. Gastroparesis: Stable. 6. Diabetes mellitus type 2: Hypoglycemia has resolved with improved oral intake. 7. Chronic atrial flutter relation on warfarin: Stable. Slow ventricular response, continue current dosing of metoprolol. Continue warfarin. 8. Hypothyroidism: Stable. 9. Status post AVR with bioprosthetic device  Overall her history is most suggestive of decompensation secondary to change in Wellbutrin (this has been described by the patient in the past as well). She responded to isotonic saline. She will need to consider an alternative antidepressant as depression is a major issue for her. She will need to reschedule followup with endocrinology for further assessment of alternative treatments for hypothyroidism and evaluation of her cortisol.   Will not restart Wellbutrin and Synthroid  Home today  Pending studies:   None  Code Status: Full code DVT prophylaxis: Warfarin Family Communication:  Disposition Plan: as above  Brendia Sacks, MD  [name]  Pager [phone] If 7PM-7AM, please contact night-coverage at [email or web address], password [facility] 05/09/2013, 11:40 AM  LOS: 3 days   Clinical Summary: 79 year old woman  presented to the emergency department with several day history of nausea, vomiting and some confusion.  Consultants:    Procedures:    HPI/Subjective: Continues to feel better. No nausea or vomiting. Eating well. Feels rate go home. She reports chronic bilateral lower extremity pain specifically in her knees. Surgery was not recommended given her overall clinical condition. No weakness or new issues with her legs.  Objective: Filed Vitals:   05/08/13 2038 05/09/13 0500 05/09/13 0800 05/09/13 1027  BP: 124/66   128/52  Pulse: 50   68  Temp: 98.1 F (36.7 C)     TempSrc: Oral     Resp: 18     Height:      Weight:  92.4 kg (203 lb 11.3 oz)    SpO2: 92%  94%     Intake/Output Summary (Last 24 hours) at 05/09/13 1140 Last data filed at 05/09/13 0400  Gross per 24 hour  Intake 1034.17 ml  Output   1450 ml  Net -415.83 ml     Filed Weights   05/07/13 0500 05/08/13 0500 05/09/13 0500  Weight: 91 kg (200 lb 9.9 oz) 90.8 kg (200 lb 2.8 oz) 92.4 kg (203 lb 11.3 oz)    Exam:   Afebrile, vital signs stable  General: Appears well. Speech fluent and clear. Grossly normal mood and affect.  Cardiovascular: Regular rate and rhythm. No murmur, rub, gallop.  Respiratory: Clear to auscultation bilaterally. No wheezes, rales, rhonchi. Normal respiratory effort.  Musculoskeletal: Excellent bilateral lower extremity strength 5/5. Both feet warm and dry. Dorsalis pedis pulses 2+ bilaterally. Skin appears unremarkable.  Data Reviewed:  Excellent oral intake  Basic metabolic panel: Sodium now 132. INR 2.8.   Scheduled Meds: . amLODipine  5 mg Oral Daily  . feeding supplement  237 mL Oral BID BM  . feeding supplement  1 Container Oral Q1200  . insulin aspart  0-9 Units Subcutaneous TID WC  . metoprolol succinate  12.5 mg Oral q morning - 10a  . nystatin   Topical TID  . pantoprazole  40 mg Oral Daily  . simvastatin  20 mg Oral QHS  . warfarin  2.5 mg Oral ONCE-1800  .  Warfarin - Pharmacist Dosing Inpatient   Does not apply Q24H   Continuous Infusions: . sodium chloride 0.9 % 1,000 mL infusion 50 mL/hr at 05/09/13 0400    Principal Problem:   Hyponatremia Active Problems:   DIABETES MELLITUS, TYPE II   Atrial fibrillation, chronic   Aortic valve disease   Depression   Hypothyroidism   Gastroparesis   Nausea and vomiting   Dehydration   Acute encephalopathy

## 2013-05-09 NOTE — Discharge Summary (Signed)
Physician Discharge Summary  Kristin Logan:[id number] DOB: 05/07/1932 DOA: 05/06/2013  PCP: [name], MD  Admit date: 05/06/2013 Discharge date: 05/09/2013  Recommendations for Outpatient Follow-up:  1. Followup hyponatremia, consider repeat basic metabolic panel 2. Followup depression. Wellbutrin has been discontinued. May need an alternative agent, see below. 3. Followup diabetes mellitus, currently well-controlled without medication, therefore Amer on hold. 4. Followup PT/INR for history of atrial fibrillation. (include homehealth, outpatient follow-up instructions, specific recommendations for PCP to follow-up on, etc.)  Follow-up Information   Follow up with [name], MD In 1 week.   Contact information:   [address] [place] [address] [phone]      Discharge Diagnoses:  1. Recurrent hyponatremia, suspect hypovolemic in etiology 2. Acute encephalopathy 3. Nausea and vomiting 4. Dehydration 5. Diabetes mellitus type 2 6. Hypothyroidism  Discharge Condition: Improved Disposition: Home  Diet recommendation: Diabetic diet. 1500 mL free water restriction.  Filed Weights   05/07/13 0500 05/08/13 0500 05/09/13 0500  Weight: 91 kg (200 lb 9.9 oz) 90.8 kg (200 lb 2.8 oz) 92.4 kg (203 lb 11.3 oz)    History of present illness:  79 year old woman presented to the emergency department with several day history of nausea, vomiting and some confusion. Initial investigation revealed hyponatremia.  Hospital Course:  Kristin Logan was admitted for treatment of hyponatremia. She was treated empirically for hypovolemic hyponatremia with isotonic saline and free water restriction. With this treatment her sodium improved to near normal, encephalopathy resolved as did nausea and vomiting. She has had recurrent hyponatremia which she has attributed to Wellbutrin. By report will be from causes nausea and vomiting and it may be that this precipitated hyponatremia  which makes sense given the clinical picture of hypovolemia. She also reports some difficulty with Synthroid which she has associated with GI upset and which she was just recently started on. As her TSH was normal we will hold this medication until followup with her primary care physician. She is stable for discharge.  1. Recurrent hyponatremia: Suspect hypovolemic hyponatremia by history, possibly secondary to vomiting induced by Wellbutrin. Resolved with free water restriction and isotonic saline. TSH normal.  2. Acute encephalopathy: Resolved. Secondary to hyponatremia. 3. Nausea and vomiting: Resolved. Multifactorial, long-standing, including gastroparesis. Not a candidate for Reglan by chart review. Likely exacerbated by hyponatremia, Wellbutrin. Outpatient GI evaluation ongoing. Outpatient endocrine evaluation is pending. Cortisol was equivocal on previous testing. 4. Dehydration: Resolved with IV fluids. 5. Gastroparesis: Stable. 6. Diabetes mellitus type 2: Hypoglycemia has resolved with improved oral intake. Blood sugars very well controlled without medication therefore hold Amaryl for now. Followup with primary care physician. 7. Chronic atrial flutter relation on warfarin: Stable. Slow ventricular response, continue current dosing of metoprolol. Continue warfarin. 8. Hypothyroidism: Stable. 9. Status post AVR with bioprosthetic device   Overall her history is most suggestive of decompensation secondary to change in Wellbutrin (this has been described by the patient in the past as well). She responded to isotonic saline. She will need to consider an alternative antidepressant as depression is a major issue for her. She will need to reschedule followup with endocrinology for further assessment of alternative treatments for hypothyroidism and evaluation of her cortisol.  Discharge Instructions  Discharge Orders   Future Appointments Provider Department Dept Phone   06/03/2013 2:15 PM Salley Scarlet, MD [facility] [phone]   Future Orders Complete By Expires     Discharge instructions  As directed  Comments:      Continue diabetic diet, limit plain water consumption to 1500 mL per day. May drink other liquids as desired. Stop Wellbutrin and Synthroid until followup with your primary care physician. Your blood sugars have been very well controlled without medication. Stop Amaryl until followup with your primary care physician. Continue to check your blood sugars at least once per day, call your physician for blood sugar less than 70 or greater than 300.    Increase activity slowly  As directed         Medication List    STOP taking these medications       buPROPion 75 MG tablet  Commonly known as:  WELLBUTRIN     glimepiride 1 MG tablet  Commonly known as:  AMARYL     levothyroxine 50 MCG tablet  Commonly known as:  SYNTHROID, LEVOTHROID      TAKE these medications       acetaminophen 650 MG CR tablet  Commonly known as:  TYLENOL  Take 1 tablet (650 mg total) by mouth every 8 (eight) hours as needed for pain.     albuterol 108 (90 BASE) MCG/ACT inhaler  Commonly known as:  PROVENTIL HFA;VENTOLIN HFA  Inhale 2 puffs into the lungs every 4 (four) hours as needed for wheezing.     albuterol (2.5 MG/3ML) 0.083% nebulizer solution  Commonly known as:  PROVENTIL  Take 3 mLs (2.5 mg total) by nebulization every 6 (six) hours as needed for wheezing.     amLODipine 5 MG tablet  Commonly known as:  NORVASC  Take 5 mg by mouth daily.     clotrimazole-betamethasone cream  Commonly known as:  LOTRISONE  Apply 1 application topically 2 (two) times daily.     diclofenac sodium 1 % Gel  Commonly known as:  VOLTAREN  Apply 1 application topically daily as needed (for knee pain).     HYDROcodone-acetaminophen 5-325 MG per tablet  Commonly known as:  NORCO  Take 1 tablet by mouth every 6 (six) hours as needed for pain.     metoprolol succinate  25 MG 24 hr tablet  Commonly known as:  TOPROL-XL  Take 0.5 tablets (12.5 mg total) by mouth every morning.     omeprazole 20 MG capsule  Commonly known as:  PRILOSEC  Take 20 mg by mouth daily.     PRESCRIPTION MEDICATION  Take 10 mg by mouth 2 (two) times daily. MOTILIUM: Domperidone 10 mg     simvastatin 20 MG tablet  Commonly known as:  ZOCOR  Take 1 tablet (20 mg total) by mouth at bedtime.     warfarin 5 MG tablet  Commonly known as:  COUMADIN  Take 5-7.5 mg by mouth See admin instructions. Take one tablet daily except take one & one-half tablet on Tuesdays and Fridays.       Allergies  Allergen Reactions  . Nsaids     Bleeding ulcer  . Codeine Nausea And Vomiting  . Adhesive (Tape) Rash    Redness, and peeling skin off.     The results of significant diagnostics from this hospitalization (including imaging, microbiology, ancillary and laboratory) are listed below for reference.    Significant Diagnostic Studies: Dg Chest Portable 1 View  05/06/2013   *RADIOLOGY REPORT*  Clinical Data: Chest pain.  PORTABLE CHEST - 1 VIEW  Comparison: Chest x-ray 02/18/2013.  Findings: There is cephalization of the pulmonary vasculature and slight indistinctness of the interstitial markings suggestive of mild pulmonary  edema.  Mild cardiomegaly.  Extensive atherosclerosis of the thoracic aorta.  No definite pleural effusions.  No acute consolidative airspace disease.  Multiple wires project over the thorax just to the right of midline.  IMPRESSION: 1.  Findings, as above, concerning for very mild congestive heart failure. 2.  Atherosclerosis.   Original Report Authenticated By: Trudie Reed, M.D.    Microbiology: Recent Results (from the past 240 hour(s))  MRSA PCR SCREENING     Status: None   Collection Time    05/06/13  9:15 PM      Result Value Range Status   MRSA by PCR NEGATIVE  NEGATIVE Final   Comment:            The GeneXpert MRSA Assay (FDA     approved for NASAL  specimens     only), is one component of a     comprehensive MRSA colonization     surveillance program. It is not     intended to diagnose MRSA     infection nor to guide or     monitor treatment for     MRSA infections.     Labs: Basic Metabolic Panel:  Recent Labs Lab 05/07/13 0127 05/07/13 0343 05/07/13 1141 05/08/13 0450 05/09/13 0457  NA 117* 124* 124* 128* 132*  K 3.7 3.3* 3.6 3.3* 3.4*  CL 85* 90* 91* 97 99  CO2 20 22 23 24 23   GLUCOSE 74 66* 88 78 156*  BUN 3* 3* 3* 4* 4*  CREATININE 0.47* 0.47* 0.48* 0.52 0.57  CALCIUM 9.1 8.9 8.9 8.8 8.6   Liver Function Tests:  Recent Labs Lab 05/06/13 1855 05/07/13 0343  AST 26 22  ALT 6 5  ALKPHOS 72 58  BILITOT 0.6 0.5  PROT 8.4* 7.0  ALBUMIN 4.5 3.6   CBC:  Recent Labs Lab 05/06/13 1855 05/07/13 0343  WBC 7.0 6.6  NEUTROABS 2.7  --   HGB 15.1* 13.2  HCT 53.5* 36.5  MCV 107.2* 81.7  PLT 317 261   Cardiac Enzymes:  Recent Labs Lab 05/06/13 1855 05/06/13 2124 05/07/13 0343 05/07/13 0931  TROPONINI <0.30 <0.30 <0.30 <0.30   CBG:  Recent Labs Lab 05/08/13 1145 05/08/13 1656 05/08/13 2113 05/09/13 0740 05/09/13 1127  GLUCAP 103* 95 110* 95 80    Principal Problem:   Hyponatremia Active Problems:   DIABETES MELLITUS, TYPE II   Atrial fibrillation, chronic   Aortic valve disease   Depression   Hypothyroidism   Gastroparesis   Nausea and vomiting   Dehydration   Acute encephalopathy   Time coordinating discharge: 25 minutes  Signed:  Brendia Sacks, MD [name] 05/09/2013, 11:55 AM

## 2013-05-12 ENCOUNTER — Telehealth: Payer: Self-pay | Admitting: *Deleted

## 2013-05-12 DIAGNOSIS — F3289 Other specified depressive episodes: Secondary | ICD-10-CM

## 2013-05-12 DIAGNOSIS — Z7901 Long term (current) use of anticoagulants: Secondary | ICD-10-CM

## 2013-05-12 DIAGNOSIS — I509 Heart failure, unspecified: Secondary | ICD-10-CM

## 2013-05-12 DIAGNOSIS — F329 Major depressive disorder, single episode, unspecified: Secondary | ICD-10-CM

## 2013-05-12 DIAGNOSIS — I4891 Unspecified atrial fibrillation: Secondary | ICD-10-CM

## 2013-05-12 DIAGNOSIS — E119 Type 2 diabetes mellitus without complications: Secondary | ICD-10-CM

## 2013-05-12 NOTE — Telephone Encounter (Signed)
Pt has been discharges from hospital, with no order to check coumadin. Pt was told that someone would be out to check it. Can they have order to check later this week?   She has been stable and he dose that she is on is 7.5 tue and Friday and 5mg  all other. 05/09/13 was last check.

## 2013-05-12 NOTE — Telephone Encounter (Signed)
LMOM for Lucky Chrismon RN to have [facility] nurse check INR later this week with results to me.

## 2013-05-14 ENCOUNTER — Inpatient Hospital Stay: Payer: Medicare Other | Admitting: Family Medicine

## 2013-05-15 ENCOUNTER — Telehealth: Payer: Self-pay | Admitting: *Deleted

## 2013-05-15 ENCOUNTER — Ambulatory Visit (INDEPENDENT_AMBULATORY_CARE_PROVIDER_SITE_OTHER): Payer: Medicare Other | Admitting: *Deleted

## 2013-05-15 DIAGNOSIS — Z7901 Long term (current) use of anticoagulants: Secondary | ICD-10-CM

## 2013-05-15 DIAGNOSIS — I482 Chronic atrial fibrillation, unspecified: Secondary | ICD-10-CM

## 2013-05-15 DIAGNOSIS — I4891 Unspecified atrial fibrillation: Secondary | ICD-10-CM

## 2013-05-15 LAB — POCT INR: INR: 2.2

## 2013-05-15 NOTE — Telephone Encounter (Signed)
Nurse called into to advise this is her visit home visit with pt and was advised by pt/family that she has noted increase of SOB on and off since hospital d/c on 05-09-13 minimal non pitting edema, lung sounds clear, denies chest pain, BP noted 142/70 HR 60 02 98%, noted HR irregular, no scale in home at this time, however family noted they will purchase today and call office with 3lb weight gain or 5lbs in a week as well as to record daily weights, still using her C-pap at night, pt family advised they d/c the fluid pill in the hospital with the last visit, however noted pt was not admitted with fluid medications and no fluid pill listed in the chart on her last OV with PCP noted 03-31-13, pt family also noted confusion that they are to get a glucometer from the cardiologist, pt was educated that the PCP will prescribe her to have a glucometer, pt has hospital f/u scheduled for PCP, the [facility] nurse advised that she wanted to pt to have pt cardiologist address the SOB concerns if possible, pt willing to come in office however noted no apt available til Tuesday and pt is scheduled with PCP on Tuesday, please advise

## 2013-05-15 NOTE — Telephone Encounter (Signed)
LMOM for Ileana Ladd RN [facility] to have pt continue current dose of coumadin and recheck INR on 8/21.

## 2013-05-15 NOTE — Telephone Encounter (Signed)
Reviewed.    Thanks!

## 2013-05-15 NOTE — Telephone Encounter (Signed)
INR - 2.2 please call with instructions / tgs  °

## 2013-05-20 ENCOUNTER — Ambulatory Visit (INDEPENDENT_AMBULATORY_CARE_PROVIDER_SITE_OTHER): Payer: Medicare Other | Admitting: Family Medicine

## 2013-05-20 ENCOUNTER — Encounter: Payer: Self-pay | Admitting: Family Medicine

## 2013-05-20 VITALS — BP 110/74 | HR 54 | Temp 97.6°F | Ht 63.75 in | Wt 194.0 lb

## 2013-05-20 DIAGNOSIS — R7989 Other specified abnormal findings of blood chemistry: Secondary | ICD-10-CM

## 2013-05-20 DIAGNOSIS — E2749 Other adrenocortical insufficiency: Secondary | ICD-10-CM

## 2013-05-20 DIAGNOSIS — E2740 Unspecified adrenocortical insufficiency: Secondary | ICD-10-CM

## 2013-05-20 DIAGNOSIS — E871 Hypo-osmolality and hyponatremia: Secondary | ICD-10-CM

## 2013-05-20 DIAGNOSIS — E119 Type 2 diabetes mellitus without complications: Secondary | ICD-10-CM

## 2013-05-20 DIAGNOSIS — E039 Hypothyroidism, unspecified: Secondary | ICD-10-CM

## 2013-05-20 DIAGNOSIS — K5900 Constipation, unspecified: Secondary | ICD-10-CM

## 2013-05-20 DIAGNOSIS — K3184 Gastroparesis: Secondary | ICD-10-CM

## 2013-05-20 NOTE — Patient Instructions (Addendum)
Do not take calcium No diabetes medications No thyroid medication Okay to take stool softeners  We will call with lab results  Referral to endocrinology F/U 6 weeks

## 2013-05-20 NOTE — Assessment & Plan Note (Signed)
There was some disconnect between last visit, she was not suppose to be taking amaryl as noted in my OV

## 2013-05-20 NOTE — Assessment & Plan Note (Signed)
?   If synthroid causing GI upset, she stopped taking levels looked okay, for now hold off

## 2013-05-20 NOTE — Assessment & Plan Note (Signed)
Probable cause of recurrent N/V She has not been able to make GI appt

## 2013-05-20 NOTE — Progress Notes (Signed)
  Subjective:    Patient ID: Kristin Logan, female    DOB: [birth year], 79 y.o.   MRN: [id number]  HPI  Patient here to followup hospital admission secondary to nausea vomiting and hyponatremia. I reviewed hospital discharge. Still unclear cause of her recurrent hyponatremia. Her Wellbutrin was stopped. There was also concerned she gets an upset stomach with her Synthroid therefore this was stopped. She continued to take her Amaryl despite being told not to take this at the last visit therefore had some mild hypoglycemia but this was also discontinued again. She states she feels well today and has not had any abdominal pain or nausea vomiting since discharge. Her appetite is also good. Her pain is well-controlled and her hip with her pain medication which she typically takes at night. She does state that all of her episodes have been centered around stress at home and she was wondering if this was causing her sodium levels to decrease. Her family members have been trying to keep her distress free environment soon she is no longer or depression medications.   Review of Systems   GEN- denies fatigue, fever, weight loss,weakness, recent illness HEENT- denies eye drainage, change in vision, nasal discharge, CVS- denies chest pain, palpitations RESP- denies SOB, cough, wheeze ABD- denies N/V, change in stools, abd pain GU- denies dysuria, hematuria, dribbling, incontinence MSK- + joint pain, muscle aches, injury Neuro- denies headache, dizziness, syncope, seizure activity       Objective:   Physical Exam  GEN- NAD, alert and oriented x 3 ,sitting in wheelchair, HEENT-PEERL, EOMI , oropharynx clear, MMM CVS- irregular rhythem, normal rate  2/6 SEM RESP-, CTAB  ABD-NABS,soft,NT,ND Ext- no swelling Pulse- Radial 2+, DP 2+ Psych- not depressed or anxious appearing        Assessment & Plan:

## 2013-05-20 NOTE — Assessment & Plan Note (Signed)
Reviewed admission, ? Water intake vs Depression meds (celexa and wellbutrin), despite her having changes in sodium level with or without wellbutrin She did have abnormal cortisol levels a few months ago She did not do well on democyclomine For now no new medications Check levels today Refer to endocrine

## 2013-05-20 NOTE — Assessment & Plan Note (Signed)
D/c calcium for now She uses stool softner and phillips if needed Will plan to get her to GI,

## 2013-05-22 ENCOUNTER — Telehealth: Payer: Self-pay | Admitting: *Deleted

## 2013-05-22 LAB — CBC WITH DIFFERENTIAL/PLATELET

## 2013-05-22 LAB — BASIC METABOLIC PANEL

## 2013-05-22 NOTE — Telephone Encounter (Signed)
Pt is going to see Dr Jeanice Lim tomorrow and she will get PT/INR with other labs and fax results to me.

## 2013-05-22 NOTE — Telephone Encounter (Signed)
Pt is not home to get INR today. Patient is in process of moving and the daughter Stanton Kidney [phone] will call us to schedule appt here when they get settled in.  Pt daughter was advised that it is important to get this checked ASAP

## 2013-05-22 NOTE — Telephone Encounter (Signed)
Kristin Logan would like you to return her call.  I asked if there was anything I could help her with and she stated that she needed to talk to you. / tgs

## 2013-05-22 NOTE — Addendum Note (Signed)
Addended by: WRAY, [place] on: 05/22/2013 09:19 AM   Modules accepted: Orders

## 2013-05-23 ENCOUNTER — Other Ambulatory Visit: Payer: Medicare Other

## 2013-05-23 ENCOUNTER — Telehealth: Payer: Self-pay | Admitting: Family Medicine

## 2013-05-23 NOTE — Telephone Encounter (Signed)
Daughter has question and would not let me know what it was.  States would really like to talk to you.

## 2013-05-23 NOTE — Telephone Encounter (Signed)
Stanton Kidney would like to speak to a nurse about her mom. Can you please call her?

## 2013-05-23 NOTE — Telephone Encounter (Signed)
Left message for pt to call hotline if urgent if , not call Monday Also mother needs to come in for her bloodwork

## 2013-05-26 ENCOUNTER — Telehealth: Payer: Self-pay | Admitting: Family Medicine

## 2013-05-26 DIAGNOSIS — E871 Hypo-osmolality and hyponatremia: Secondary | ICD-10-CM

## 2013-05-26 DIAGNOSIS — Z5181 Encounter for therapeutic drug level monitoring: Secondary | ICD-10-CM

## 2013-05-26 NOTE — Telephone Encounter (Signed)
Spoke with Kristin Logan has been very upset and crying the past few days. Friday also had N/V that resolved with use of ginger, she missed her coumadin appt because of this. She became upset when multiple people from therapy and nursing came to the home, she was very frustrated and was in pain with her knees. Her daughter has scheduled an appt with Dr. Hilda Lias tomorrow for steroid injections in both knees, they are giving the pain meds every 6 hours as needed She will take her to get her blood drawn tomorrow for her sodium level and her Coumadin level. I will followup on Wednesday  At this time we're concerned that the antidepressants are causing her sodium levels have dropped even more severely therefore have to hold off on these

## 2013-05-27 LAB — CBC
Hemoglobin: 12.8 g/dL (ref 12.0–15.0)
Platelets: 357 10*3/uL (ref 150–400)
RBC: 4.33 MIL/uL (ref 3.87–5.11)
WBC: 6.8 10*3/uL (ref 4.0–10.5)

## 2013-05-28 ENCOUNTER — Telehealth: Payer: Self-pay | Admitting: *Deleted

## 2013-05-28 LAB — BASIC METABOLIC PANEL
BUN: 5 mg/dL — ABNORMAL LOW (ref 6–23)
Calcium: 9.4 mg/dL (ref 8.4–10.5)
Chloride: 96 mEq/L (ref 96–112)
Creat: 0.54 mg/dL (ref 0.50–1.10)

## 2013-05-28 NOTE — Telephone Encounter (Signed)
INR 1.29 per labs drawn yesterday.  Labs are in EPIC. / tgs

## 2013-05-29 ENCOUNTER — Ambulatory Visit: Payer: Self-pay | Admitting: Cardiology

## 2013-05-29 DIAGNOSIS — Z7901 Long term (current) use of anticoagulants: Secondary | ICD-10-CM

## 2013-05-29 DIAGNOSIS — I482 Chronic atrial fibrillation, unspecified: Secondary | ICD-10-CM

## 2013-05-29 NOTE — Telephone Encounter (Signed)
Opened coumadin encounter

## 2013-06-03 ENCOUNTER — Ambulatory Visit: Payer: Medicare Other | Admitting: Family Medicine

## 2013-06-04 ENCOUNTER — Encounter: Payer: Self-pay | Admitting: *Deleted

## 2013-06-06 ENCOUNTER — Other Ambulatory Visit: Payer: Self-pay | Admitting: Family Medicine

## 2013-06-11 ENCOUNTER — Ambulatory Visit (INDEPENDENT_AMBULATORY_CARE_PROVIDER_SITE_OTHER): Payer: Medicare Other | Admitting: *Deleted

## 2013-06-11 DIAGNOSIS — I4891 Unspecified atrial fibrillation: Secondary | ICD-10-CM

## 2013-06-11 DIAGNOSIS — Z7901 Long term (current) use of anticoagulants: Secondary | ICD-10-CM

## 2013-06-11 DIAGNOSIS — I482 Chronic atrial fibrillation, unspecified: Secondary | ICD-10-CM

## 2013-06-18 ENCOUNTER — Other Ambulatory Visit: Payer: Self-pay | Admitting: Family Medicine

## 2013-06-20 ENCOUNTER — Other Ambulatory Visit: Payer: Self-pay | Admitting: Family Medicine

## 2013-06-20 ENCOUNTER — Telehealth: Payer: Self-pay | Admitting: Family Medicine

## 2013-06-20 MED ORDER — AMLODIPINE BESYLATE 5 MG PO TABS: 5.0000 mg | ORAL_TABLET | Freq: Every day | ORAL | Status: DC

## 2013-06-20 NOTE — Telephone Encounter (Signed)
Amlodipine 5mg

## 2013-06-20 NOTE — Telephone Encounter (Signed)
Meds refilled.

## 2013-06-24 ENCOUNTER — Encounter: Payer: Self-pay | Admitting: Cardiovascular Disease

## 2013-07-02 ENCOUNTER — Ambulatory Visit: Payer: Medicare Other | Admitting: Cardiovascular Disease

## 2013-07-02 ENCOUNTER — Encounter: Payer: Self-pay | Admitting: *Deleted

## 2013-07-09 ENCOUNTER — Ambulatory Visit (INDEPENDENT_AMBULATORY_CARE_PROVIDER_SITE_OTHER): Payer: Medicare Other | Admitting: *Deleted

## 2013-07-09 DIAGNOSIS — I4891 Unspecified atrial fibrillation: Secondary | ICD-10-CM

## 2013-07-09 DIAGNOSIS — I482 Chronic atrial fibrillation, unspecified: Secondary | ICD-10-CM

## 2013-07-09 DIAGNOSIS — Z7901 Long term (current) use of anticoagulants: Secondary | ICD-10-CM

## 2013-07-09 LAB — POCT INR: INR: 1.6

## 2013-07-16 ENCOUNTER — Ambulatory Visit: Payer: Medicare Other | Admitting: Family Medicine

## 2013-07-18 ENCOUNTER — Ambulatory Visit (INDEPENDENT_AMBULATORY_CARE_PROVIDER_SITE_OTHER): Payer: Medicare Other | Admitting: *Deleted

## 2013-07-18 DIAGNOSIS — I482 Chronic atrial fibrillation, unspecified: Secondary | ICD-10-CM

## 2013-07-18 DIAGNOSIS — I4891 Unspecified atrial fibrillation: Secondary | ICD-10-CM

## 2013-07-18 DIAGNOSIS — Z7901 Long term (current) use of anticoagulants: Secondary | ICD-10-CM

## 2013-07-21 ENCOUNTER — Ambulatory Visit (INDEPENDENT_AMBULATORY_CARE_PROVIDER_SITE_OTHER): Payer: Medicare Other | Admitting: Family Medicine

## 2013-07-21 NOTE — Progress Notes (Signed)
  Subjective:    Patient ID: Kristin Logan, female    DOB: 1932/05/20, 79 y.o.   MRN: [id number]  HPI   Review of Systems     Objective:   Physical Exam       Assessment & Plan:

## 2013-07-23 ENCOUNTER — Telehealth: Payer: Self-pay | Admitting: Family Medicine

## 2013-07-23 MED ORDER — OMEPRAZOLE 20 MG PO CPDR: 20.0000 mg | DELAYED_RELEASE_CAPSULE | Freq: Two times a day (BID) | ORAL | Status: DC

## 2013-07-23 NOTE — Telephone Encounter (Signed)
Omeprazole 20mg BID

## 2013-07-24 ENCOUNTER — Inpatient Hospital Stay (HOSPITAL_COMMUNITY)
Admission: EM | Admit: 2013-07-24 | Discharge: 2013-07-27 | DRG: 641 | Disposition: A | Discharge status: Home Health | Payer: Medicare Other | Attending: Internal Medicine | Admitting: Internal Medicine

## 2013-07-24 ENCOUNTER — Encounter (HOSPITAL_COMMUNITY): Payer: Self-pay | Admitting: Emergency Medicine

## 2013-07-24 ENCOUNTER — Emergency Department (HOSPITAL_COMMUNITY): Payer: Medicare Other

## 2013-07-24 DIAGNOSIS — G8929 Other chronic pain: Secondary | ICD-10-CM | POA: Diagnosis present

## 2013-07-24 DIAGNOSIS — Z9010 Acquired absence of unspecified breast and nipple: Secondary | ICD-10-CM

## 2013-07-24 DIAGNOSIS — I498 Other specified cardiac arrhythmias: Secondary | ICD-10-CM

## 2013-07-24 DIAGNOSIS — K5900 Constipation, unspecified: Secondary | ICD-10-CM | POA: Diagnosis present

## 2013-07-24 DIAGNOSIS — M51379 Other intervertebral disc degeneration, lumbosacral region without mention of lumbar back pain or lower extremity pain: Secondary | ICD-10-CM | POA: Diagnosis present

## 2013-07-24 DIAGNOSIS — E878 Other disorders of electrolyte and fluid balance, not elsewhere classified: Secondary | ICD-10-CM

## 2013-07-24 DIAGNOSIS — Z923 Personal history of irradiation: Secondary | ICD-10-CM

## 2013-07-24 DIAGNOSIS — M5137 Other intervertebral disc degeneration, lumbosacral region: Secondary | ICD-10-CM | POA: Diagnosis present

## 2013-07-24 DIAGNOSIS — Z823 Family history of stroke: Secondary | ICD-10-CM

## 2013-07-24 DIAGNOSIS — I482 Chronic atrial fibrillation, unspecified: Secondary | ICD-10-CM | POA: Diagnosis present

## 2013-07-24 DIAGNOSIS — I10 Essential (primary) hypertension: Secondary | ICD-10-CM | POA: Diagnosis present

## 2013-07-24 DIAGNOSIS — E119 Type 2 diabetes mellitus without complications: Secondary | ICD-10-CM | POA: Diagnosis present

## 2013-07-24 DIAGNOSIS — E785 Hyperlipidemia, unspecified: Secondary | ICD-10-CM | POA: Diagnosis present

## 2013-07-24 DIAGNOSIS — M1710 Unilateral primary osteoarthritis, unspecified knee: Secondary | ICD-10-CM | POA: Diagnosis present

## 2013-07-24 DIAGNOSIS — R5381 Other malaise: Secondary | ICD-10-CM

## 2013-07-24 DIAGNOSIS — Z7901 Long term (current) use of anticoagulants: Secondary | ICD-10-CM

## 2013-07-24 DIAGNOSIS — E871 Hypo-osmolality and hyponatremia: Principal | ICD-10-CM | POA: Diagnosis present

## 2013-07-24 DIAGNOSIS — E876 Hypokalemia: Secondary | ICD-10-CM | POA: Diagnosis present

## 2013-07-24 DIAGNOSIS — R001 Bradycardia, unspecified: Secondary | ICD-10-CM | POA: Diagnosis present

## 2013-07-24 DIAGNOSIS — Z853 Personal history of malignant neoplasm of breast: Secondary | ICD-10-CM

## 2013-07-24 DIAGNOSIS — G4730 Sleep apnea, unspecified: Secondary | ICD-10-CM | POA: Diagnosis present

## 2013-07-24 DIAGNOSIS — I4891 Unspecified atrial fibrillation: Secondary | ICD-10-CM | POA: Diagnosis present

## 2013-07-24 DIAGNOSIS — Z79899 Other long term (current) drug therapy: Secondary | ICD-10-CM

## 2013-07-24 DIAGNOSIS — F329 Major depressive disorder, single episode, unspecified: Secondary | ICD-10-CM | POA: Diagnosis present

## 2013-07-24 DIAGNOSIS — E860 Dehydration: Secondary | ICD-10-CM | POA: Diagnosis present

## 2013-07-24 DIAGNOSIS — K219 Gastro-esophageal reflux disease without esophagitis: Secondary | ICD-10-CM | POA: Diagnosis present

## 2013-07-24 DIAGNOSIS — Z8249 Family history of ischemic heart disease and other diseases of the circulatory system: Secondary | ICD-10-CM

## 2013-07-24 DIAGNOSIS — Z23 Encounter for immunization: Secondary | ICD-10-CM

## 2013-07-24 DIAGNOSIS — I359 Nonrheumatic aortic valve disorder, unspecified: Secondary | ICD-10-CM | POA: Diagnosis present

## 2013-07-24 DIAGNOSIS — E039 Hypothyroidism, unspecified: Secondary | ICD-10-CM

## 2013-07-24 DIAGNOSIS — R531 Weakness: Secondary | ICD-10-CM | POA: Diagnosis present

## 2013-07-24 DIAGNOSIS — Z952 Presence of prosthetic heart valve: Secondary | ICD-10-CM

## 2013-07-24 DIAGNOSIS — F3289 Other specified depressive episodes: Secondary | ICD-10-CM | POA: Diagnosis present

## 2013-07-24 LAB — COMPREHENSIVE METABOLIC PANEL
AST: 29 U/L (ref 0–37)
CO2: 26 mEq/L (ref 19–32)
Calcium: 9 mg/dL (ref 8.4–10.5)
Creatinine, Ser: 0.69 mg/dL (ref 0.50–1.10)
GFR calc Af Amer: >90 mL/min (ref >90)
GFR calc non Af Amer: 81 mL/min — ABNORMAL LOW (ref >90)
Potassium: 3.1 mEq/L — ABNORMAL LOW (ref 3.5–5.1)

## 2013-07-24 LAB — CBC WITH DIFFERENTIAL/PLATELET
Basophils Absolute: 0.1 10*3/uL (ref 0.0–0.1)
Basophils Relative: 1 % (ref 0–1)
Eosinophils Relative: 13 % — ABNORMAL HIGH (ref 0–5)
HCT: 36.9 % (ref 36.0–46.0)
Lymphocytes Relative: 39 % (ref 12–46)
Lymphs Abs: 2.1 10*3/uL (ref 0.7–4.0)
MCHC: 36 g/dL (ref 30.0–36.0)
MCV: 83.5 fL (ref 78.0–100.0)
Monocytes Absolute: 0.5 10*3/uL (ref 0.1–1.0)
Monocytes Relative: 9 % (ref 3–12)
Neutro Abs: 2.1 10*3/uL (ref 1.7–7.7)
RBC: 4.42 MIL/uL (ref 3.87–5.11)
RDW: 14 % (ref 11.5–15.5)

## 2013-07-24 LAB — PRO B NATRIURETIC PEPTIDE: Pro B Natriuretic peptide (BNP): 439.5 pg/mL (ref 0–450)

## 2013-07-24 LAB — MAGNESIUM: Magnesium: 2.3 mg/dL (ref 1.5–2.5)

## 2013-07-24 LAB — GLUCOSE, CAPILLARY: Glucose-Capillary: 107 mg/dL — ABNORMAL HIGH (ref 70–99)

## 2013-07-24 LAB — BASIC METABOLIC PANEL
BUN: 5 mg/dL — ABNORMAL LOW (ref 6–23)
Calcium: 8.5 mg/dL (ref 8.4–10.5)
Chloride: 88 mEq/L — ABNORMAL LOW (ref 96–112)
Creatinine, Ser: 0.66 mg/dL (ref 0.50–1.10)
GFR calc Af Amer: >90 mL/min (ref >90)
GFR calc non Af Amer: 82 mL/min — ABNORMAL LOW (ref >90)
Glucose, Bld: 123 mg/dL — ABNORMAL HIGH (ref 70–99)
Potassium: 3.2 mEq/L — ABNORMAL LOW (ref 3.5–5.1)
Sodium: 121 mEq/L — ABNORMAL LOW (ref 135–145)

## 2013-07-24 LAB — PROTIME-INR
INR: 1.72 — ABNORMAL HIGH (ref 0.00–1.49)
Prothrombin Time: 19.7 seconds — ABNORMAL HIGH (ref 11.6–15.2)

## 2013-07-24 LAB — APTT: aPTT: 50 seconds — ABNORMAL HIGH (ref 24–37)

## 2013-07-24 MED ORDER — ACETAMINOPHEN 325 MG PO TABS
650.0000 mg | ORAL_TABLET | Freq: Four times a day (QID) | ORAL | Status: DC | PRN
  Administered 2013-07-25: 650 mg via ORAL
  Filled 2013-07-24: qty 2

## 2013-07-24 MED ORDER — SODIUM CHLORIDE 0.9 % IV SOLN
INTRAVENOUS | Status: DC
  Administered 2013-07-24 – 2013-07-26 (×4): via INTRAVENOUS

## 2013-07-24 MED ORDER — WARFARIN SODIUM 7.5 MG PO TABS
7.5000 mg | ORAL_TABLET | Freq: Once | ORAL | Status: AC
  Administered 2013-07-24: 7.5 mg via ORAL
  Filled 2013-07-24 (×2): qty 1

## 2013-07-24 MED ORDER — ENOXAPARIN SODIUM 40 MG/0.4ML ~~LOC~~ SOLN
40.0000 mg | SUBCUTANEOUS | Status: DC
  Administered 2013-07-24 – 2013-07-26 (×3): 40 mg via SUBCUTANEOUS
  Filled 2013-07-24 (×3): qty 0.4

## 2013-07-24 MED ORDER — ONDANSETRON HCL 4 MG/2ML IJ SOLN
4.0000 mg | Freq: Once | INTRAMUSCULAR | Status: AC
  Administered 2013-07-24: 4 mg via INTRAMUSCULAR
  Filled 2013-07-24: qty 2

## 2013-07-24 MED ORDER — ACETAMINOPHEN 650 MG RE SUPP: 650.0000 mg | Freq: Four times a day (QID) | RECTAL | Status: DC | PRN

## 2013-07-24 MED ORDER — ALBUTEROL SULFATE HFA 108 (90 BASE) MCG/ACT IN AERS: 2.0000 | INHALATION_SPRAY | RESPIRATORY_TRACT | Status: DC | PRN

## 2013-07-24 MED ORDER — AMLODIPINE BESYLATE 5 MG PO TABS
5.0000 mg | ORAL_TABLET | Freq: Every day | ORAL | Status: DC
  Administered 2013-07-24 – 2013-07-27 (×4): 5 mg via ORAL
  Filled 2013-07-24 (×4): qty 1

## 2013-07-24 MED ORDER — ALBUTEROL SULFATE (5 MG/ML) 0.5% IN NEBU: 2.5000 mg | INHALATION_SOLUTION | Freq: Four times a day (QID) | RESPIRATORY_TRACT | Status: DC | PRN

## 2013-07-24 MED ORDER — PANTOPRAZOLE SODIUM 40 MG PO TBEC
40.0000 mg | DELAYED_RELEASE_TABLET | Freq: Every day | ORAL | Status: DC
  Administered 2013-07-24 – 2013-07-27 (×4): 40 mg via ORAL
  Filled 2013-07-24 (×4): qty 1

## 2013-07-24 MED ORDER — INSULIN ASPART 100 UNIT/ML ~~LOC~~ SOLN
0.0000 [IU] | Freq: Three times a day (TID) | SUBCUTANEOUS | Status: DC
  Administered 2013-07-25 – 2013-07-26 (×2): 2 [IU] via SUBCUTANEOUS

## 2013-07-24 MED ORDER — INSULIN ASPART 100 UNIT/ML ~~LOC~~ SOLN: 0.0000 [IU] | Freq: Every day | SUBCUTANEOUS | Status: DC

## 2013-07-24 MED ORDER — ALUM & MAG HYDROXIDE-SIMETH 200-200-20 MG/5ML PO SUSP: 30.0000 mL | Freq: Four times a day (QID) | ORAL | Status: DC | PRN

## 2013-07-24 MED ORDER — ONDANSETRON HCL 4 MG PO TABS: 4.0000 mg | ORAL_TABLET | Freq: Four times a day (QID) | ORAL | Status: DC | PRN

## 2013-07-24 MED ORDER — DOCUSATE SODIUM 100 MG PO CAPS
100.0000 mg | ORAL_CAPSULE | Freq: Two times a day (BID) | ORAL | Status: DC
  Administered 2013-07-24 – 2013-07-27 (×6): 100 mg via ORAL
  Filled 2013-07-24 (×6): qty 1

## 2013-07-24 MED ORDER — ONDANSETRON HCL 4 MG/2ML IJ SOLN: 4.0000 mg | Freq: Four times a day (QID) | INTRAMUSCULAR | Status: DC | PRN

## 2013-07-24 MED ORDER — BISACODYL 10 MG RE SUPP
10.0000 mg | Freq: Every day | RECTAL | Status: DC | PRN
  Administered 2013-07-26: 10 mg via RECTAL
  Filled 2013-07-24 (×2): qty 1

## 2013-07-24 MED ORDER — WARFARIN - PHARMACIST DOSING INPATIENT: Freq: Every day | Status: DC

## 2013-07-24 MED ORDER — FLEET ENEMA 7-19 GM/118ML RE ENEM: 1.0000 | ENEMA | Freq: Once | RECTAL | Status: AC | PRN

## 2013-07-24 MED ORDER — POTASSIUM CHLORIDE CRYS ER 20 MEQ PO TBCR
40.0000 meq | EXTENDED_RELEASE_TABLET | ORAL | Status: AC
  Administered 2013-07-24 (×2): 40 meq via ORAL
  Filled 2013-07-24 (×2): qty 2

## 2013-07-24 MED ORDER — INFLUENZA VAC SPLIT QUAD 0.5 ML IM SUSP
0.5000 mL | INTRAMUSCULAR | Status: AC
  Administered 2013-07-25: 0.5 mL via INTRAMUSCULAR
  Filled 2013-07-24: qty 0.5

## 2013-07-24 MED ORDER — HYDROCODONE-ACETAMINOPHEN 5-325 MG PO TABS
1.0000 | ORAL_TABLET | Freq: Every evening | ORAL | Status: DC | PRN
  Administered 2013-07-24 – 2013-07-26 (×2): 1 via ORAL
  Filled 2013-07-24 (×2): qty 1

## 2013-07-24 MED ORDER — SIMVASTATIN 20 MG PO TABS
20.0000 mg | ORAL_TABLET | Freq: Every day | ORAL | Status: DC
  Administered 2013-07-24 – 2013-07-26 (×3): 20 mg via ORAL
  Filled 2013-07-24 (×3): qty 1

## 2013-07-24 NOTE — Progress Notes (Signed)
ANTICOAGULATION CONSULT NOTE - Initial Consult  Pharmacy Consult for Warfarin Indication: atrial fibrillation  Allergies  Allergen Reactions  . Nsaids     Bleeding ulcer  . Codeine Nausea And Vomiting  . Adhesive [Tape] Rash    Redness, and peeling skin off.     Patient Measurements: Height: 5\' 5"  (165.1 cm) Weight: 195 lb (88.451 kg) IBW/kg (Calculated) : 57 Heparin Dosing Weight:   Vital Signs: Temp: 97.2 F (36.2 C) (10/23 1151) Temp src: Oral (10/23 1151) BP: 134/70 mmHg (10/23 1151) Pulse Rate: 60 (10/23 1151)  Labs:  Recent Labs  07/24/13 1322  HGB 13.3  HCT 36.9  PLT 242  APTT 50*  LABPROT 19.7*  INR 1.72*  CREATININE 0.69    Estimated Creatinine Clearance: 62.7 ml/min (by C-G formula based on Cr of 0.69).   Medical History: Past Medical History  Diagnosis Date  . Diabetes mellitus   . Hyperlipidemia   . Eosinophilia   . Cataract   . Hypertension   . Atrial fibrillation prior to 2008    moderate biatrial enlargement in 2009; mild to moderate LVH with a low-normal EF  . GERD (gastroesophageal reflux disease)     Hemorrhoids; history of peptic ulcer disease  . DJD (degenerative joint disease) of knee   . Sleep apnea     CPAP  . Chronic anticoagulation   . Aortic valve disease     2009: mild stenosis and regurgitation; peak gradient of 30-35 mmHg , subsequent AVR at [facility]  . Incontinence     Mild  . Carcinoma of breast     Right mastectomy; radiation and hormonal therapy  . Hip fracture, left 12/12/2012  . Foot injury 06/25/2012  . Degenerative joint disease of knee 01/29/2008    Bilateral    . BREAST CANCER, HX OF 01/29/2008    Lumpectomy, radiation therapy  Arimidex stopped in July 2013    . Syncope and collapse 11/11/2012  . Diverticulosis   . Umbilical hernia   . Cholelithiasis   . DDD (degenerative disc disease), lumbar   . Vertebral compression fracture     lumbar  . Chronic pain   . Paresthesias     feet    Medications:   Scheduled:  . warfarin  7.5 mg Oral ONCE-1800  . Warfarin - Pharmacist Dosing Inpatient   Does not apply q1800    Assessment: Continuation of warfarin from home for atrial fibrillation PTA Warfarin 7.5 mg Tues,Thurs,Sat,Sun, 5 mg Mon,Wed,Fri INR subtherapeutic on admission (1.72)  Goal of Therapy:  INR 2-3 Monitor platelets by anticoagulation protocol: Yes   Plan:  Warfarin 7.5 mg po today (home dosage) INR/PT daily CBC, monitor platelets   Raquel James, [place] Mcluckie Bennett 07/24/2013,4:19 PM

## 2013-07-24 NOTE — H&P (Signed)
Patient seen and examined. Agree with not as above per Toya Smothers, NP.  She's been admitted with generalized weakness, decreased appetite and some hyponatremia. Appears to be a hypovolemic hyponatremia. Agree with starting IV fluids. We'll repeat levels in the morning. She is also bradycardic. She does have a history of visual fibrillation and is on beta blockers. This will be held for the time being and we'll monitor on telemetry. Check thyroid studies. Physical therapy evaluation  MEMON,JEHANZEB

## 2013-07-24 NOTE — ED Notes (Signed)
Called to give report, Clydie Braun to call me back.

## 2013-07-24 NOTE — ED Notes (Signed)
Last few days progressively getting weaker.  Has problems with sodium.  Also has chf.  Yesterday had a 10 lb weight gain.  Also c/o headache since last night

## 2013-07-24 NOTE — ED Provider Notes (Signed)
CSN: [id number]     Arrival date & time 07/24/13  1141 History  This chart was scribed for Ward Givens, MD by Quintella Reichert, ED scribe.  This patient was seen in room APA17/AP[facility]17 and the patient's care was started at 1:04 PM.   Chief Complaint  Patient presents with  . Weakness    The history is provided by the patient and a relative. No language interpreter was used.    HPI Comments: Kristin Logan is a 79 y.o. female with h/o A-fib, aortic valve disease, breast cancer, and hyponatremia who presents to the Emergency Department complaining of progressively-worsening generalized weakness that began one week ago.  Pt states she is normally ambulatory with a walker but since this morning has not even been able to do that without falling.  This morning she was in her restroom and fell onto a wall.  She denies head impact or injury to any area in the fall.  Pt also feels SOB mainly at night.   She had to use the restroom more times than usual last night.  She has also had a mild cough.  She has had no appetite but family has successfully convinced her to eat normally for the past several days.  She was also nauseated this morning.  Pt denies leg swelling, abdominal distension, or CP.  She has had weight gain. She lives at home with family.  She is not on oxygen at home but uses a breathing machine when needed.  Pt also has h/o constipation.  She did not get her flu vaccination this year.  She reports she needs to see Dr Hilda Lias soon to get her knees injected again.   PCP is [name]   Past Medical History  Diagnosis Date  . Diabetes mellitus   . Hyperlipidemia   . Eosinophilia   . Cataract   . Hypertension   . Atrial fibrillation prior to 2008    moderate biatrial enlargement in 2009; mild to moderate LVH with a low-normal EF  . GERD (gastroesophageal reflux disease)     Hemorrhoids; history of peptic ulcer disease  . DJD (degenerative joint disease) of knee   . Sleep apnea     CPAP   . Chronic anticoagulation   . Aortic valve disease     2009: mild stenosis and regurgitation; peak gradient of 30-35 mmHg , subsequent [facility]R at [facility]  . Incontinence     Mild  . Carcinoma of breast     Right mastectomy; radiation and hormonal therapy  . Hip fracture, left 12/12/2012  . Foot injury 06/25/2012  . Degenerative joint disease of knee 01/29/2008    Bilateral    . BREAST CANCER, HX OF 01/29/2008    Lumpectomy, radiation therapy  Arimidex stopped in July 2013    . Syncope and collapse 11/11/2012  . Diverticulosis   . Umbilical hernia   . Cholelithiasis   . DDD (degenerative disc disease), lumbar   . Vertebral compression fracture     lumbar  . Chronic pain   . Paresthesias     feet    Past Surgical History  Procedure Laterality Date  . Exploration post operative open heart    . Colonoscopy      Approximately 2000  . Cardiac valve replacement      [facility]  . Cardiac valve replacement    . Breast surgery    . Breast lumpectomy    . Esophagogastroduodenoscopy N/A 02/20/2013    Procedure: ESOPHAGOGASTRODUODENOSCOPY (EGD);  Surgeon: Malissa Hippo, MD;  Location: [facility];  Service: Endoscopy;  Laterality: N/A;    Family History  Problem Relation Age of Onset  . Heart disease Mother   . Stroke Father   . Cancer Sister   . Heart disease Sister   . Stroke Sister   . Stroke Brother    History  Substance Use Topics  . Smoking status: Never Smoker   . Smokeless tobacco: Not on file  . Alcohol Use: No   Was living at home with her daughter, for past week has moved in with her son and his wife. Uses a walker   OB History   Grav Para Term Preterm Abortions TAB SAB Ect Mult Living                 Review of Systems  Constitutional: Positive for appetite change.  Respiratory: Positive for cough and shortness of breath.   Cardiovascular: Negative for chest pain and leg swelling.  Gastrointestinal: Positive for nausea. Negative for abdominal distention.   Genitourinary: Positive for frequency.  Neurological: Positive for weakness.  All other systems reviewed and are negative.    Allergies  Nsaids; Codeine; and Adhesive  Home Medications   Current Outpatient Rx  Name  Route  Sig  Dispense  Refill  . acetaminophen (TYLENOL) 650 MG CR tablet   Oral   Take 1 tablet (650 mg total) by mouth every 8 (eight) hours as needed for pain.         Marland Kitchen amLODipine (NORVASC) 5 MG tablet   Oral   Take 1 tablet (5 mg total) by mouth daily.   30 tablet   2   . diclofenac sodium (VOLTAREN) 1 % GEL   Topical   Apply 1 application topically daily as needed (for knee pain).         Marland Kitchen HYDROcodone-acetaminophen (NORCO/VICODIN) 5-325 MG per tablet   Oral   Take 1 tablet by mouth at bedtime as needed for pain.         . metoprolol succinate (TOPROL-XL) 25 MG 24 hr tablet   Oral   Take 0.5 tablets (12.5 mg total) by mouth every morning.         . Multiple Vitamins-Minerals (CENTRUM SILVER ULTRA WOMENS PO)   Oral   Take 1 tablet by mouth daily.          Marland Kitchen omeprazole (PRILOSEC) 20 MG capsule   Oral   Take 1 capsule (20 mg total) by mouth 2 (two) times daily.   60 capsule   5   . Sennosides (EX-LAX PO)   Oral   Take 2 tablets by mouth daily as needed (constipation).         . simvastatin (ZOCOR) 20 MG tablet   Oral   Take 1 tablet (20 mg total) by mouth at bedtime.   30 tablet   6   . warfarin (COUMADIN) 5 MG tablet      5-7.5 mg See admin instructions. Take one tablet (5 mg) on Monday, Wednesday, and Friday. Takes 1.5 tabs (7.5 mg) on Tuesday Thursday Saturday and Sunday         . albuterol (PROVENTIL HFA;VENTOLIN HFA) 108 (90 BASE) MCG/ACT inhaler   Inhalation   Inhale 2 puffs into the lungs every 4 (four) hours as needed for wheezing.   1 Inhaler   2   . albuterol (PROVENTIL) (2.5 MG/3ML) 0.083% nebulizer solution   Nebulization   Take 3 mLs (2.5 mg  total) by nebulization every 6 (six) hours as needed for  wheezing.   75 mL   3   . clotrimazole-betamethasone (LOTRISONE) cream   Topical   Apply 1 application topically daily.         Marland Kitchen docusate sodium (COLACE) 100 MG capsule   Oral   Take 100 mg by mouth 2 (two) times daily as needed for constipation.          Marland Kitchen HYDROcodone-acetaminophen (NORCO) 5-325 MG per tablet   Oral   Take 1 tablet by mouth every 6 (six) hours as needed for pain.   60 tablet   3    BP 134/70  Pulse 60  Temp(Src) 97.2 F (36.2 C) (Oral)  Resp 18  Ht 5\' 5"  (1.651 m)  Wt 195 lb (88.451 kg)  BMI 32.45 kg/m2  SpO2 98%  Vital signs normal    Physical Exam  Nursing note and vitals reviewed. Constitutional: She is oriented to person, place, and time. She appears well-developed.  Non-toxic appearance. She does not appear ill. No distress.  Appears diffusely weak  HENT:  Head: Normocephalic and atraumatic.  Right Ear: External ear normal.  Left Ear: External ear normal.  Nose: Nose normal. No mucosal edema or rhinorrhea.  Mouth/Throat: Oropharynx is clear and moist. Mucous membranes are dry. No dental abscesses or uvula swelling.  Eyes: Conjunctivae and EOM are normal. Pupils are equal, round, and reactive to light.  Neck: Normal range of motion and full passive range of motion without pain. Neck supple.  Cardiovascular: Normal rate, regular rhythm and normal heart sounds.  Exam reveals no gallop and no friction rub.   No murmur heard. Pulmonary/Chest: Effort normal and breath sounds normal. No respiratory distress. She has no wheezes. She has no rhonchi. She has no rales. She exhibits no tenderness and no crepitus.  Abdominal: Soft. Normal appearance and bowel sounds are normal. She exhibits no distension. There is no tenderness. There is no rebound and no guarding.  Musculoskeletal: Normal range of motion. She exhibits no edema and no tenderness.  Moves all extremities well.   Neurological: She is alert and oriented to person, place, and time. She has  normal strength. No cranial nerve deficit.  Skin: Skin is warm, dry and intact. No rash noted. No erythema. No pallor.  Psychiatric: She has a normal mood and affect. Her speech is normal and behavior is normal. Her mood appears not anxious.  Flat affect    ED Course  Procedures (including critical care time)  Medications  ondansetron (ZOFRAN) injection 4 mg (4 mg Intramuscular Given 07/24/13 1345)     DIAGNOSTIC STUDIES: Oxygen Saturation is 98% on room air, normal by my interpretation.    COORDINATION OF CARE: 1:11 PM: Discussed treatment plan which includes anti-emetics, labs and CXR.  Pt and family expressed understanding and agreed to plan.   Family given test results.  15:02 Dr Kerry Hough, admit to observation, tele, Team 1  Labs Review Results for orders placed during the hospital encounter of 07/24/13  CBC WITH DIFFERENTIAL      Result Value Range   WBC 5.4  4.0 - 10.5 K/uL   RBC 4.42  3.87 - 5.11 MIL/uL   Hemoglobin 13.3  12.0 - 15.0 g/dL   HCT 96.0  45.4 - 09.8 %   MCV 83.5  78.0 - 100.0 fL   MCH 30.1  26.0 - 34.0 pg   MCHC 36.0  30.0 - 36.0 g/dL   RDW 11.9  14.7 -  15.5 %   Platelets 242  150 - 400 K/uL   Neutrophils Relative % 38 (*) 43 - 77 %   Neutro Abs 2.1  1.7 - 7.7 K/uL   Lymphocytes Relative 39  12 - 46 %   Lymphs Abs 2.1  0.7 - 4.0 K/uL   Monocytes Relative 9  3 - 12 %   Monocytes Absolute 0.5  0.1 - 1.0 K/uL   Eosinophils Relative 13 (*) 0 - 5 %   Eosinophils Absolute 0.7  0.0 - 0.7 K/uL   Basophils Relative 1  0 - 1 %   Basophils Absolute 0.1  0.0 - 0.1 K/uL  COMPREHENSIVE METABOLIC PANEL      Result Value Range   Sodium 124 (*) 135 - 145 mEq/L   Potassium 3.1 (*) 3.5 - 5.1 mEq/L   Chloride 88 (*) 96 - 112 mEq/L   CO2 26  19 - 32 mEq/L   Glucose, Bld 90  70 - 99 mg/dL   BUN 4 (*) 6 - 23 mg/dL   Creatinine, Ser 4.09  0.50 - 1.10 mg/dL   Calcium 9.0  8.4 - 81.1 mg/dL   Total Protein 7.0  6.0 - 8.3 g/dL   Albumin 3.8  3.5 - 5.2 g/dL   AST 29  0  - 37 U/L   ALT 7  0 - 35 U/L   Alkaline Phosphatase 54  39 - 117 U/L   Total Bilirubin 0.4  0.3 - 1.2 mg/dL   GFR calc non Af Amer 81 (*) >90 mL/min   GFR calc Af Amer >90  >90 mL/min  APTT      Result Value Range   aPTT 50 (*) 24 - 37 seconds  PROTIME-INR      Result Value Range   Prothrombin Time 19.7 (*) 11.6 - 15.2 seconds   INR 1.72 (*) 0.00 - 1.49  PRO B NATRIURETIC PEPTIDE      Result Value Range   Pro B Natriuretic peptide (BNP) 439.5  0 - 450 pg/mL   Laboratory interpretation all normal except hyponatremia, hypokalemia, low chloride, subtherapeutic INR   Imaging Review Dg Chest Portable 1 View  07/24/2013   CLINICAL DATA:  Shortness of breath and weakness.  EXAM: PORTABLE CHEST - 1 VIEW  COMPARISON:  05/06/2013.  FINDINGS: The heart is enlarged but stable. The mediastinal and hilar contours are unchanged. There is mild vascular congestion but no overt pulmonary edema. No pleural effusion or focal infiltrate. The bony thorax is intact.  IMPRESSION: Cardiac enlargement and mild vascular congestion but no edema or effusions.   Electronically Signed   By: Loralie Champagne M.D.   On: 07/24/2013 14:00    EKG Interpretation     Ventricular Rate:  49 PR Interval:    QRS Duration: 108 QT Interval:  460 QTC Calculation: 415 R Axis:   27 Text Interpretation:  Undetermined rhythm Anteroseptal infarct (cited on or before 06-May-2013) ST \\T \ T wave abnormality, consider inferior ischemia EKG from 05/06/2013 has too much artifact to compare. When compared to Feb 18, 2013 T wave inversion more evident in Inferior leads            MDM   1. Weakness   2. Hyponatremia   3. Hypokalemia   4. Chloride, decreased level     Plan admission  Devoria Albe, MD, FACEP    I personally performed the services described in this documentation, which was scribed in my presence. The recorded information has been  reviewed and considered.  Devoria Albe, MD, Armando Gang    Ward Givens,  MD 07/24/13 [phone]

## 2013-07-24 NOTE — H&P (Signed)
[name] History and Physical  Kristin Logan:[id number] DOB: Dec 17, 1931 DOA: 07/24/2013  Referring physician:  PCP: [name], MD  Specialists:   Chief Complaint: Weakness  HPI: Kristin Logan is a 79 y.o. female with a history of hypertension, atrial fibrillation, status post aVR with bioprosthetic device, on chronic anticoagulation with Coumadin, hypothyroidism, depression who came to the emergency department today with the chief complaint of one-week history of progressive worsening weakness. Information is obtained from the patient and her daughter who is at the bedside. Daughter reports over the last 7 days patient has been calm him increasingly weak her. She has taken less and less by mouth. This morning weakness had progressed to the point the patient was unable to stand. Yesterday morning patient had a near fall while going to the bathroom. She denies dizziness lightheadedness syncope or near-syncope. She states that her legs just got too weak to hold her. She also developed 2 days ago nausea without vomiting. In addition she has experienced worsening shortness of breath with exertion. Daughter indicates that patient's weight has gone from 186-195 over the last 10 days. She denies chest pain palpitations cough fever chills. She denies abdominal pain dysuria hematuria frequency or urgency. She does endorse constipation and reports her last BM was 6 days ago. Evaluation in the emergency room is significant for sodium level of 124, potassium of 3.1, INR 1.72 chest x-ray yields cardiac enlargement and mild vascular congestion but no edema or effusions. EKG with undetermined rhythm. Vital signs are significant for a heart rate of 45-60 otherwise vital signs are stable. We are asked to admit for further evaluation and treatment    Review of Systems: 10 point review of systems completed and all systems are negative except as indicated in history of present illness  Past  Medical History  Diagnosis Date  . Diabetes mellitus   . Hyperlipidemia   . Eosinophilia   . Cataract   . Hypertension   . Atrial fibrillation prior to 2008    moderate biatrial enlargement in 2009; mild to moderate LVH with a low-normal EF  . GERD (gastroesophageal reflux disease)     Hemorrhoids; history of peptic ulcer disease  . DJD (degenerative joint disease) of knee   . Sleep apnea     CPAP  . Chronic anticoagulation   . Aortic valve disease     2009: mild stenosis and regurgitation; peak gradient of 30-35 mmHg , subsequent AVR at [facility]  . Incontinence     Mild  . Carcinoma of breast     Right mastectomy; radiation and hormonal therapy  . Hip fracture, left 12/12/2012  . Foot injury 06/25/2012  . Degenerative joint disease of knee 01/29/2008    Bilateral    . BREAST CANCER, HX OF 01/29/2008    Lumpectomy, radiation therapy  Arimidex stopped in July 2013    . Syncope and collapse 11/11/2012  . Diverticulosis   . Umbilical hernia   . Cholelithiasis   . DDD (degenerative disc disease), lumbar   . Vertebral compression fracture     lumbar  . Chronic pain   . Paresthesias     feet   Past Surgical History  Procedure Laterality Date  . Exploration post operative open heart    . Colonoscopy      Approximately 2000  . Cardiac valve replacement      [facility]  . Cardiac valve replacement    . Breast surgery    . Breast lumpectomy    .  Esophagogastroduodenoscopy N/A 02/20/2013    Procedure: ESOPHAGOGASTRODUODENOSCOPY (EGD);  Surgeon: Malissa Hippo, MD;  Location: [facility];  Service: Endoscopy;  Laterality: N/A;   Social History:  reports that she has never smoked. She does not have any smokeless tobacco history on file. She reports that she does not drink alcohol or use illicit drugs. Patient lives with her daughter. Uses a walker with ambulation Allergies  Allergen Reactions  . Nsaids     Bleeding ulcer  . Codeine Nausea And Vomiting  . Adhesive [Tape] Rash     Redness, and peeling skin off.     Family History  Problem Relation Age of Onset  . Heart disease Mother   . Stroke Father   . Cancer Sister   . Heart disease Sister   . Stroke Sister   . Stroke Brother      Prior to Admission medications   Medication Sig Start Date End Date Taking? Authorizing Provider  acetaminophen (TYLENOL) 650 MG CR tablet Take 1 tablet (650 mg total) by mouth every 8 (eight) hours as needed for pain. 01/21/13  Yes Standley Brooking, MD  amLODipine (NORVASC) 5 MG tablet Take 1 tablet (5 mg total) by mouth daily. 06/20/13  Yes Salley Scarlet, MD  diclofenac sodium (VOLTAREN) 1 % GEL Apply 1 application topically daily as needed (for knee pain).   Yes Historical Provider, MD  HYDROcodone-acetaminophen (NORCO/VICODIN) 5-325 MG per tablet Take 1 tablet by mouth at bedtime as needed for pain.   Yes Historical Provider, MD  metoprolol succinate (TOPROL-XL) 25 MG 24 hr tablet Take 0.5 tablets (12.5 mg total) by mouth every morning. 05/09/13  Yes Standley Brooking, MD  Multiple Vitamins-Minerals (CENTRUM SILVER ULTRA WOMENS PO) Take 1 tablet by mouth daily.    Yes Historical Provider, MD  omeprazole (PRILOSEC) 20 MG capsule Take 1 capsule (20 mg total) by mouth 2 (two) times daily. 07/23/13  Yes Salley Scarlet, MD  Sennosides (EX-LAX PO) Take 2 tablets by mouth daily as needed (constipation).   Yes Historical Provider, MD  simvastatin (ZOCOR) 20 MG tablet Take 1 tablet (20 mg total) by mouth at bedtime. 01/28/13  Yes Salley Scarlet, MD  warfarin (COUMADIN) 5 MG tablet 5-7.5 mg See admin instructions. Take one tablet (5 mg) on Monday, Wednesday, and Friday. Takes 1.5 tabs (7.5 mg) on Tuesday Thursday Saturday and Sunday   Yes Historical Provider, MD  albuterol (PROVENTIL HFA;VENTOLIN HFA) 108 (90 BASE) MCG/ACT inhaler Inhale 2 puffs into the lungs every 4 (four) hours as needed for wheezing. 10/15/12 10/15/13  Salley Scarlet, MD  albuterol (PROVENTIL) (2.5 MG/3ML) 0.083%  nebulizer solution Take 3 mLs (2.5 mg total) by nebulization every 6 (six) hours as needed for wheezing. 03/31/13   Salley Scarlet, MD  clotrimazole-betamethasone (LOTRISONE) cream Apply 1 application topically daily. 07/04/13   Historical Provider, MD  docusate sodium (COLACE) 100 MG capsule Take 100 mg by mouth 2 (two) times daily as needed for constipation.     Historical Provider, MD  HYDROcodone-acetaminophen (NORCO) 5-325 MG per tablet Take 1 tablet by mouth every 6 (six) hours as needed for pain. 04/18/13 04/18/14  Salley Scarlet, MD   Physical Exam: Filed Vitals:   07/24/13 1151  BP: 134/70  Pulse: 60  Temp: 97.2 F (36.2 C)  Resp: 18     General:  Somewhat frail but well-nourished no acute distress  Eyes: PERRLA, EOMI, no scleral icterus  ENT: Ears clear nose without drainage  oropharynx without erythema or exudate. Mucous membranes are her mouth are pink slightly dry  Neck: Supple no JVD full range of motion no lymphadenopathy  Cardiovascular: Heart sounds are distant, bradycardia, regular, no murmur or gallop or rub appreciated, no lower extremity edema  Respiratory: Normal effort. Slightly shallow but breath sounds are clear bilaterally I hear no crackles no wheeze no rhonchi  Abdomen: Obese soft positive bowel sounds nontender to palpation no mass organomegaly noted  Skin: Warm and dry no lesions or rashes noted  Musculoskeletal: Joints without erythema or swelling. Nontender to palpation  Psychiatric: Calm cooperative  Neurologic: Cranial nerves II through XII grossly intact speech is clear facial symmetry  Labs on Admission:  Basic Metabolic Panel:  Recent Labs Lab 07/24/13 1322  NA 124*  K 3.1*  CL 88*  CO2 26  GLUCOSE 90  BUN 4*  CREATININE 0.69  CALCIUM 9.0   Liver Function Tests:  Recent Labs Lab 07/24/13 1322  AST 29  ALT 7  ALKPHOS 54  BILITOT 0.4  PROT 7.0  ALBUMIN 3.8   No results found for this basename: LIPASE, AMYLASE,  in the  last 168 hours No results found for this basename: AMMONIA,  in the last 168 hours CBC:  Recent Labs Lab 07/24/13 1322  WBC 5.4  NEUTROABS 2.1  HGB 13.3  HCT 36.9  MCV 83.5  PLT 242   Cardiac Enzymes: No results found for this basename: CKTOTAL, CKMB, CKMBINDEX, TROPONINI,  in the last 168 hours  BNP (last 3 results)  Recent Labs  11/11/12 0108 01/14/13 2215 07/24/13 1322  PROBNP 354.6 386.4 439.5   CBG: No results found for this basename: GLUCAP,  in the last 168 hours  Radiological Exams on Admission: Dg Chest Portable 1 View  07/24/2013   CLINICAL DATA:  Shortness of breath and weakness.  EXAM: PORTABLE CHEST - 1 VIEW  COMPARISON:  05/06/2013.  FINDINGS: The heart is enlarged but stable. The mediastinal and hilar contours are unchanged. There is mild vascular congestion but no overt pulmonary edema. No pleural effusion or focal infiltrate. The bony thorax is intact.  IMPRESSION: Cardiac enlargement and mild vascular congestion but no edema or effusions.   Electronically Signed   By: Loralie Champagne M.D.   On: 07/24/2013 14:00    EKG: Independently reviewed undetermined rhythm  Assessment/Plan Active Problems: Hyponatremia: Recurrent. Likely related to mild to moderate dehydration from decreased by mouth intake. Will admit to telemetry. Will start IV normal saline at 75 mils per hour. Will restrict her free water intake. Will check a sodium level later tonight. Will also get urine sodium and osmolality. Will check serum osmolality as well.    Weakness: Likely related to #1. Will request PT and OT consult. No signs of infection. Of note patient baseline is ambulating with a walker    Dehydration: Likely related to decreased by mouth intake over the last several days. Will gently hydrate with normal saline intravenously. Will offer her a diet.    Bradycardia: Etiology unclear. Patient did take her beta blocker this morning. Will hold this for now. Will monitor on  telemetry. Will repeat an EKG in the a.m. Blood pressure is stable    Hypokalemia: Likely related to #1 and decreased by mouth intake. Will replete and recheck in the morning. Will check a mag level as well    DIABETES MELLITUS, TYPE II will check hemoglobin A1c. Will hold home medications. Will use sliding scale insulin for optimal glycemic control.  HYPERLIPIDEMIA: Will continue statin.     HYPERTENSION: Stable. Will hold beta blocker do to #4. Continue Norvasc     Atrial fibrillation, chronic: INR 1.72 on admission. Will request Coumadin dosing per pharmacy. Patient is on Toprol-XL 12.5 daily will hold that for now     GERD: Appears stable continue PPI   Code Status: full Family Communication: daughter at bedside Disposition Plan: home when ready  Time spent: 42 minutes  Gwenyth Bender [name] Pager [phone]  If 7PM-7AM, please contact night-coverage [email or web address] Password [email or web address] 07/24/2013, 3:49 PM

## 2013-07-25 LAB — CBC
HCT: 34.6 % — ABNORMAL LOW (ref 36.0–46.0)
Hemoglobin: 12.2 g/dL (ref 12.0–15.0)
MCH: 29.7 pg (ref 26.0–34.0)
MCHC: 35.3 g/dL (ref 30.0–36.0)
RBC: 4.11 MIL/uL (ref 3.87–5.11)
RDW: 14.3 % (ref 11.5–15.5)

## 2013-07-25 LAB — COMPREHENSIVE METABOLIC PANEL
AST: 24 U/L (ref 0–37)
Alkaline Phosphatase: 48 U/L (ref 39–117)
BUN: 5 mg/dL — ABNORMAL LOW (ref 6–23)
CO2: 25 mEq/L (ref 19–32)
Calcium: 8.8 mg/dL (ref 8.4–10.5)
GFR calc Af Amer: >90 mL/min (ref >90)
GFR calc non Af Amer: 80 mL/min — ABNORMAL LOW (ref >90)
Glucose, Bld: 77 mg/dL (ref 70–99)
Total Protein: 6.3 g/dL (ref 6.0–8.3)

## 2013-07-25 LAB — OSMOLALITY: Osmolality: 252 mOsm/kg — ABNORMAL LOW (ref 275–300)

## 2013-07-25 LAB — GLUCOSE, CAPILLARY
Glucose-Capillary: 89 mg/dL (ref 70–99)
Glucose-Capillary: 128 mg/dL — ABNORMAL HIGH (ref 70–99)

## 2013-07-25 LAB — HEMOGLOBIN A1C: Hgb A1c MFr Bld: 5.7 % — ABNORMAL HIGH (ref <5.7)

## 2013-07-25 LAB — PROTIME-INR: Prothrombin Time: 20.5 seconds — ABNORMAL HIGH (ref 11.6–15.2)

## 2013-07-25 MED ORDER — WARFARIN SODIUM 7.5 MG PO TABS
7.5000 mg | ORAL_TABLET | Freq: Once | ORAL | Status: AC
  Administered 2013-07-25: 7.5 mg via ORAL
  Filled 2013-07-25: qty 1

## 2013-07-25 MED ORDER — WARFARIN - PHARMACIST DOSING INPATIENT: Status: DC

## 2013-07-25 NOTE — Progress Notes (Signed)
UR chart review completed.  

## 2013-07-25 NOTE — Care Management Note (Signed)
    Page 1 of 1   07/25/2013     11:04:57 AM   CARE MANAGEMENT NOTE 07/25/2013  Patient:  Kristin Logan, Kristin Logan   Account Number:  [id number]  Date Initiated:  07/25/2013  Documentation initiated by:  Sharrie Rothman  Subjective/Objective Assessment:   Pt admitted from home with hyponatremia. Pt lives with her son and daughter in law. Pt has hover round, 3N1, walker, shower bench, neb machine. Pt active with [facility] RN. CAP aide services being transferred to [facility].     Action/Plan:   Will arrange resumption of [facility] RN. Alroy Bailiff of [facility] aware of admission and possible weekend d/c. HH services to resume within 48 hours of discharge. No DME needs noted.   Anticipated DC Date:  07/27/2013   Anticipated DC Plan:  HOME W HOME HEALTH SERVICES      DC Planning Services  CM consult      [facility] Choice  Resumption Of Svcs/PTA Provider   Choice offered to / List presented to:  C-4 Adult Children        HH arranged  HH-1 RN      [facility] agency  [facility]   Status of service:  Completed, signed off Medicare Important Message given?   (If response is "NO", the following Medicare IM given date fields will be blank) Date Medicare IM given:   Date Additional Medicare IM given:    Discharge Disposition:  HOME W HOME HEALTH SERVICES  Per UR Regulation:    If discussed at Long Length of Stay Meetings, dates discussed:    Comments:  07/25/13 1105 Arlyss Queen, RN BSN CM

## 2013-07-25 NOTE — Evaluation (Signed)
Physical Therapy Evaluation Patient Details Name: Kristin Logan MRN: [id number] DOB: 04/30/1932 Today's Date: 07/25/2013 Time: 1308-6578 PT Time Calculation (min): 23 min  PT Assessment / Plan / Recommendation History of Present Illness  79 y.o. female with a history of hypertension, atrial fibrillation, status post aVR with bioprosthetic device, on chronic anticoagulation with Coumadin, hypothyroidism, depression who came to the emergency department today with the chief complaint of one-week history of progressive worsening weakness. She's been admitted with generalized weakness, decreased appetite and some hyponatremia. Started IV fluids.  She lives at home with her son and daugther in law and requires assistance with RW.   Clinical Impression  Pt requires min A from sit to stand from lowered bed surface and instruction on proper hand placement.  Declines to walk at this time due to her OA knee pain and the early hour and typically in her day becomes more mobil around 10 am.  Family reports she has lived with them for about a week due to requiring increased assistance with household activities. Spoke with nursing and placed an order for nursing to continue with mobility up in a chair and around her room, as pt would like to sit up for some of the day. Educated family member on importance of mobility to decrease risk of secondary impairments (pt reports she prefers to use her RW rather than the scooter).  Recommend HHPT to improve functional LE strength and balance.     PT Assessment  Patent does not need any further PT services    Follow Up Recommendations  Home health PT    Does the patient have the potential to tolerate intense rehabilitation      Barriers to Discharge        Equipment Recommendations  None recommended by PT    Recommendations for Other Services     Frequency      Precautions / Restrictions Precautions Precautions: Fall   Pertinent Vitals/Pain "Headache"        Mobility  Bed Mobility Bed Mobility: Left Sidelying to Sit;Sit to Supine;Sitting - Scoot to Edge of Bed;Scooting to [facility] Left Sidelying to Sit: 5: Supervision;With rails Supine to Sit: 5: Supervision;With rails Sitting - Scoot to Edge of Bed: 4: Min assist;With rail Sit to Supine: HOB flat;4: Min assist Scooting to [facility]: 5: Supervision;With rail Transfers Transfers: Sit to Stand;Stand to Sit;Stand Pivot Transfers Sit to Stand: 4: Min assist;With upper extremity assist;From bed;From chair/3-in-1;5: Supervision Stand to Sit: To chair/3-in-1;5: Supervision;To bed Stand Pivot Transfers: 5: Supervision Details for Transfer Assistance: used RW with standing and with stand pivot transfer to [facility]. Ambulation/Gait Ambulation/Gait Assistance: Not tested (comment)    Exercises     PT Diagnosis:    PT Problem List:   PT Treatment Interventions:       PT Goals(Current goals can be found in the care plan section)    Visit Information  Last PT Received On: 07/25/13 History of Present Illness: 79 y.o. female with a history of hypertension, atrial fibrillation, status post aVR with bioprosthetic device, on chronic anticoagulation with Coumadin, hypothyroidism, depression who came to the emergency department today with the chief complaint of one-week history of progressive worsening weakness. She's been admitted with generalized weakness, decreased appetite and some hyponatremia. Started IV fluids.  She lives at home with her son and daugther in law and requires assistance with RW.        Prior Functioning  Home Living Family/patient expects to be discharged to:: Private residence Living Arrangements:  Children Available Help at Discharge: Family;Available 24 hours/day Type of Home: House Home Access: Ramped entrance Home Layout: One level Home Equipment: Bedside commode;Hospital bed;Walker - 2 wheels;Shower seat;Electric scooter Prior Function Level of Independence: Needs  assistance Gait / Transfers Assistance Needed: supervision w/RW and use of scooter Communication Communication: HOH    Cognition       Extremity/Trunk Assessment Lower Extremity Assessment Lower Extremity Assessment: Generalized weakness   Balance Balance Balance Assessed: Yes Static Standing Balance Static Standing - Balance Support: Bilateral upper extremity supported Static Standing - Level of Assistance: 5: Stand by assistance  End of Session PT - End of Session Equipment Utilized During Treatment: Gait belt Activity Tolerance: Patient limited by fatigue Patient left: in bed;with family/visitor present;with call bell/phone within reach Nurse Communication: Mobility status  GP     Jamia Hoban, MPT, ATC 07/25/2013, 8:42 AM

## 2013-07-25 NOTE — Progress Notes (Signed)
Patient seen and examined. Agree with not as above.  Hyponatremia appears to be improving with IV fluids. Appears to be hypovolemic. Continue normal saline infusion. Appetite appears to be improving. Overall clinical condition also improving.  Bradycardia persists. TSH is pending. Patient was on Toprol which has been held. She appears to be asymptomatic at this time. Followup with cardiology as needed.  Anticipate discharge home tomorrow if further improvement.  Kristin Logan

## 2013-07-25 NOTE — Plan of Care (Signed)
Problem: Phase I Progression Outcomes Goal: OOB as tolerated unless otherwise ordered Outcome: Completed/Met Date Met:  07/25/13 07/25/13 1516 Patient up to chair with assistance, general weakness. Unsteady gait at times. family at bedside assisting with care today. Instructed to call for assistance getting up for safety. bedalarm on for safety, call light kept within reach. Devin Going ,RN

## 2013-07-25 NOTE — Progress Notes (Signed)
[name] PROGRESS NOTE  Kristin Logan GMW:[id number] DOB: 10/31/1931 DOA: 07/24/2013 PCP: [name], MD  Assessment/Plan: Hyponatremia: Recurrent. Likely related to mild to moderate dehydration from decreased by mouth intake. Trending up today. Continue  IV normal saline at 50 mils per hour as patient is taking po's somewhat better. Will restrict her free water intake. Will check a sodium level later tonight. Will also get urine sodium and osmolality. serum osmolality 252, urine sodium unremarkable, urine osmolality pending.    Weakness: Likely related to #1. PT evaluation recommending HH PT.  No signs of infection. Of note patient baseline is ambulating with a walker. Will arrange HH  Dehydration: Likely related to decreased by mouth intake over the last several days. Continue to  hydrate with normal saline intravenously. Tolerating diet.   Bradycardia: Etiology unclear. TSH pending. Blood pressure is stable. Continue to hold BB  Hypokalemia: Likely related to #1 and decreased by mouth intake. Resolved this am. DIABETES MELLITUS, TYPE II Hemoglobin A1c 5.7. Continue to  hold home medications. CBG range 74-107. Continue sliding scale insulin for optimal glycemic control.   HYPERLIPIDEMIA: Will continue statin.   HYPERTENSION: Stable. Will hold beta blocker do to #4. Continue Norvasc   Atrial fibrillation, chronic: INR 1.82. Coumadin dosing per pharmacy. Patient is on Toprol-XL 12.5 daily and will continue to hold.   GERD: Appears stable continue PPI    Code Status: full Family Communication: daughter at bedside Disposition Plan: home hopefully tomorrow   Consultants:  none  Procedures:  none  Antibiotics:  none  HPI/Subjective: Sitting up in bed. Reports "feeling a whole lot better"  Objective: Filed Vitals:   07/25/13 0439  BP: 121/65  Pulse: 48  Temp: 97.7 F (36.5 C)  Resp: 20    Intake/Output Summary (Last 24 hours) at 07/25/13 0853 Last  data filed at 07/25/13 4403  Gross per 24 hour  Intake   1000 ml  Output    450 ml  Net    550 ml   Filed Weights   07/24/13 1151 07/24/13 1712 07/25/13 0413  Weight: 88.451 kg (195 lb) 86.6 kg (190 lb 14.7 oz) 92.5 kg (203 lb 14.8 oz)    Exam:   General:  Awake alet NAD  Cardiovascular: bradycarda, irregular, distant HS no Murmur. No LE edema  Respiratory: normal effort BS clear bilaterally no wheeze no rhonchi  Abdomen: soft +BS  Non-tender to palpation  Musculoskeletal: no clubbing no cyanosis   Data Reviewed: Basic Metabolic Panel:  Recent Labs Lab 07/24/13 1322 07/24/13 2216 07/25/13 0448  NA 124* 121* 126*  K 3.1* 3.2* 4.8  CL 88* 88* 94*  CO2 26 25 25   GLUCOSE 90 123* 77  BUN 4* 5* 5*  CREATININE 0.69 0.66 0.70  CALCIUM 9.0 8.5 8.8  MG 2.3  --   --    Liver Function Tests:  Recent Labs Lab 07/24/13 1322 07/25/13 0448  AST 29 24  ALT 7 7  ALKPHOS 54 48  BILITOT 0.4 0.4  PROT 7.0 6.3  ALBUMIN 3.8 3.5   No results found for this basename: LIPASE, AMYLASE,  in the last 168 hours No results found for this basename: AMMONIA,  in the last 168 hours CBC:  Recent Labs Lab 07/24/13 1322 07/25/13 0448  WBC 5.4 6.3  NEUTROABS 2.1  --   HGB 13.3 12.2  HCT 36.9 34.6*  MCV 83.5 84.2  PLT 242 264   Cardiac Enzymes: No results found for this basename: CKTOTAL, CKMB,  CKMBINDEX, TROPONINI,  in the last 168 hours BNP (last 3 results)  Recent Labs  11/11/12 0108 01/14/13 2215 07/24/13 1322  PROBNP 354.6 386.4 439.5   CBG:  Recent Labs Lab 07/24/13 2113 07/25/13 0723  GLUCAP 107* 74    No results found for this or any previous visit (from the past 240 hour(s)).   Studies: Dg Chest Portable 1 View  07/24/2013   CLINICAL DATA:  Shortness of breath and weakness.  EXAM: PORTABLE CHEST - 1 VIEW  COMPARISON:  05/06/2013.  FINDINGS: The heart is enlarged but stable. The mediastinal and hilar contours are unchanged. There is mild vascular  congestion but no overt pulmonary edema. No pleural effusion or focal infiltrate. The bony thorax is intact.  IMPRESSION: Cardiac enlargement and mild vascular congestion but no edema or effusions.   Electronically Signed   By: Loralie Champagne M.D.   On: 07/24/2013 14:00    Scheduled Meds: . amLODipine  5 mg Oral Daily  . docusate sodium  100 mg Oral BID  . enoxaparin (LOVENOX) injection  40 mg Subcutaneous Q24H  . influenza vac split quadrivalent PF  0.5 mL Intramuscular Tomorrow-1000  . insulin aspart  0-15 Units Subcutaneous TID WC  . insulin aspart  0-5 Units Subcutaneous QHS  . pantoprazole  40 mg Oral Daily  . simvastatin  20 mg Oral QHS  . Warfarin - Pharmacist Dosing Inpatient   Does not apply q1800   Continuous Infusions: . sodium chloride 100 mL/hr at 07/25/13 0605    Principal Problem:   Hypokalemia Active Problems:   DIABETES MELLITUS, TYPE II   HYPERLIPIDEMIA   HYPERTENSION   Atrial fibrillation, chronic   GERD   Hyponatremia   Weakness   Dehydration   Bradycardia    Time spent: 30 minutes    [facility] M  [name] Pager [phone]. If 7PM-7AM, please contact night-coverage at [email or web address], password [email or web address] 07/25/2013, 8:53 AM  LOS: 1 day

## 2013-07-25 NOTE — Progress Notes (Signed)
ANTICOAGULATION CONSULT NOTE  Pharmacy Consult for Warfarin Indication: atrial fibrillation  Allergies  Allergen Reactions  . Nsaids     Bleeding ulcer  . Codeine Nausea And Vomiting  . Adhesive [Tape] Rash    Redness, and peeling skin off.     Patient Measurements: Height: 5\' 5"  (165.1 cm) Weight: 203 lb 14.8 oz (92.5 kg) IBW/kg (Calculated) : 57  Vital Signs: Temp: 97.7 F (36.5 C) (10/24 0439) Temp src: Oral (10/24 0439) BP: 121/65 mmHg (10/24 0439) Pulse Rate: 48 (10/24 0439)  Labs:  Recent Labs  07/24/13 1322 07/24/13 2216 07/25/13 0448  HGB 13.3  --  12.2  HCT 36.9  --  34.6*  PLT 242  --  264  APTT 50*  --   --   LABPROT 19.7*  --  20.5*  INR 1.72*  --  1.82*  CREATININE 0.69 0.66 0.70    Estimated Creatinine Clearance: 64.1 ml/min (by C-G formula based on Cr of 0.7).   Medical History: Past Medical History  Diagnosis Date  . Diabetes mellitus   . Hyperlipidemia   . Eosinophilia   . Cataract   . Hypertension   . Atrial fibrillation prior to 2008    moderate biatrial enlargement in 2009; mild to moderate LVH with a low-normal EF  . GERD (gastroesophageal reflux disease)     Hemorrhoids; history of peptic ulcer disease  . DJD (degenerative joint disease) of knee   . Sleep apnea     CPAP  . Chronic anticoagulation   . Aortic valve disease     2009: mild stenosis and regurgitation; peak gradient of 30-35 mmHg , subsequent AVR at [facility]  . Incontinence     Mild  . Carcinoma of breast     Right mastectomy; radiation and hormonal therapy  . Hip fracture, left 12/12/2012  . Foot injury 06/25/2012  . Degenerative joint disease of knee 01/29/2008    Bilateral    . BREAST CANCER, HX OF 01/29/2008    Lumpectomy, radiation therapy  Arimidex stopped in July 2013    . Syncope and collapse 11/11/2012  . Diverticulosis   . Umbilical hernia   . Cholelithiasis   . DDD (degenerative disc disease), lumbar   . Vertebral compression fracture     lumbar  .  Chronic pain   . Paresthesias     feet    Medications:  Scheduled:  . amLODipine  5 mg Oral Daily  . docusate sodium  100 mg Oral BID  . enoxaparin (LOVENOX) injection  40 mg Subcutaneous Q24H  . insulin aspart  0-15 Units Subcutaneous TID WC  . insulin aspart  0-5 Units Subcutaneous QHS  . pantoprazole  40 mg Oral Daily  . simvastatin  20 mg Oral QHS  . Warfarin - Pharmacist Dosing Inpatient   Does not apply Q24H    Assessment: Continuation of warfarin from home for atrial fibrillation PTA Warfarin 7.5 mg Tues,Thurs,Sat,Sun, 5 mg Mon,Wed,Fri INR subtherapeutic this AM  @ 1.82.  Goal of Therapy:  INR 2-3   Plan:  Repeat Warfarin 7.5 mg po today. INR/PT daily Also on SQ Lovenox 40mg  daily.  Lamonte Richer R 07/25/2013,11:42 AM

## 2013-07-26 ENCOUNTER — Inpatient Hospital Stay (HOSPITAL_COMMUNITY): Payer: Medicare Other

## 2013-07-26 DIAGNOSIS — E039 Hypothyroidism, unspecified: Secondary | ICD-10-CM

## 2013-07-26 LAB — CBC
MCHC: 34.7 g/dL (ref 30.0–36.0)
MCV: 84.6 fL (ref 78.0–100.0)
Platelets: 226 10*3/uL (ref 150–400)
RDW: 14.2 % (ref 11.5–15.5)
WBC: 5.2 10*3/uL (ref 4.0–10.5)

## 2013-07-26 LAB — GLUCOSE, CAPILLARY
Glucose-Capillary: 150 mg/dL — ABNORMAL HIGH (ref 70–99)
Glucose-Capillary: 123 mg/dL — ABNORMAL HIGH (ref 70–99)
Glucose-Capillary: 88 mg/dL (ref 70–99)

## 2013-07-26 LAB — BASIC METABOLIC PANEL
Calcium: 8.5 mg/dL (ref 8.4–10.5)
Chloride: 95 mEq/L — ABNORMAL LOW (ref 96–112)
Creatinine, Ser: 0.63 mg/dL (ref 0.50–1.10)
GFR calc Af Amer: >90 mL/min (ref >90)
Potassium: 3.5 mEq/L (ref 3.5–5.1)
Sodium: 128 mEq/L — ABNORMAL LOW (ref 135–145)

## 2013-07-26 LAB — PROTIME-INR
INR: 1.72 — ABNORMAL HIGH (ref 0.00–1.49)
Prothrombin Time: 19.7 seconds — ABNORMAL HIGH (ref 11.6–15.2)

## 2013-07-26 MED ORDER — WARFARIN SODIUM 10 MG PO TABS
10.0000 mg | ORAL_TABLET | Freq: Once | ORAL | Status: AC
  Administered 2013-07-26: 10 mg via ORAL
  Filled 2013-07-26: qty 1

## 2013-07-26 MED ORDER — FUROSEMIDE 10 MG/ML IJ SOLN
20.0000 mg | Freq: Once | INTRAMUSCULAR | Status: AC
  Administered 2013-07-26: 20 mg via INTRAVENOUS
  Filled 2013-07-26: qty 2

## 2013-07-26 MED ORDER — SODIUM CHLORIDE 1 G PO TABS
1.0000 g | ORAL_TABLET | Freq: Three times a day (TID) | ORAL | Status: DC
  Administered 2013-07-26 – 2013-07-27 (×3): 1 g via ORAL
  Filled 2013-07-26 (×6): qty 1

## 2013-07-26 NOTE — Progress Notes (Signed)
[name] PROGRESS NOTE  Kristin Logan HYQ:[id number] DOB: 04-01-1932 DOA: 07/24/2013 PCP: [name], MD  Assessment/Plan: Hyponatremia: Recurrent. Improving with IV fluids. Continue saline for now. Continue fluid restriction and add salt tablets. Repeat labs in the morning.   Weakness: Likely related to #1. PT evaluation recommending HH PT.  No signs of infection. Of note patient baseline is ambulating with a walker. Will arrange HH. Appears to be improving with hydration  Dehydration: Likely related to decreased by mouth intake over the last several days. Continue to  hydrate with normal saline intravenously. Tolerating diet.   Bradycardia: TSH normal. Beta blocker held and bradycardia has improved.  Hypokalemia: Likely related to #1 and decreased by mouth intake. Resolved.  DIABETES MELLITUS, TYPE II Hemoglobin A1c 5.7. Continue to  hold home medications. CBG range 74-107. Continue sliding scale insulin for optimal glycemic control.   HYPERLIPIDEMIA: Will continue statin.   HYPERTENSION: Stable. Will hold beta blocker do to #4. Continue Norvasc   Atrial fibrillation, chronic: INR 1.82. Coumadin dosing per pharmacy. Patient is on Toprol-XL 12.5 daily and will continue to hold.   GERD: Appears stable continue PPI    Code Status: full Family Communication: daughter at bedside Disposition Plan: home hopefully tomorrow   Consultants:  none  Procedures:  none  Antibiotics:  none  HPI/Subjective: Feeling better, trying to eat lunch, not much of an appetite  Objective: Filed Vitals:   07/26/13 1448  BP: 122/67  Pulse: 54  Temp: 97.5 F (36.4 C)  Resp: 18    Intake/Output Summary (Last 24 hours) at 07/26/13 1501 Last data filed at 07/26/13 0900  Gross per 24 hour  Intake 1390.83 ml  Output   2200 ml  Net -809.17 ml   Filed Weights   07/24/13 1712 07/25/13 0413 07/26/13 0534  Weight: 86.6 kg (190 lb 14.7 oz) 92.5 kg (203 lb 14.8 oz) 92.2 kg  (203 lb 4.2 oz)    Exam:   General:  Awake alet NAD  Cardiovascular: bradycardic, irregular, distant HS no Murmur. No LE edema  Respiratory: normal effort BS clear bilaterally no wheeze no rhonchi  Abdomen: soft +BS  Non-tender to palpation  Musculoskeletal: no clubbing no cyanosis   Data Reviewed: Basic Metabolic Panel:  Recent Labs Lab 07/24/13 1322 07/24/13 2216 07/25/13 0448 07/26/13 0557  NA 124* 121* 126* 128*  K 3.1* 3.2* 4.8 3.5  CL 88* 88* 94* 95*  CO2 26 25 25 25   GLUCOSE 90 123* 77 96  BUN 4* 5* 5* 6  CREATININE 0.69 0.66 0.70 0.63  CALCIUM 9.0 8.5 8.8 8.5  MG 2.3  --   --   --    Liver Function Tests:  Recent Labs Lab 07/24/13 1322 07/25/13 0448  AST 29 24  ALT 7 7  ALKPHOS 54 48  BILITOT 0.4 0.4  PROT 7.0 6.3  ALBUMIN 3.8 3.5   No results found for this basename: LIPASE, AMYLASE,  in the last 168 hours No results found for this basename: AMMONIA,  in the last 168 hours CBC:  Recent Labs Lab 07/24/13 1322 07/25/13 0448 07/26/13 0557  WBC 5.4 6.3 5.2  NEUTROABS 2.1  --   --   HGB 13.3 12.2 11.6*  HCT 36.9 34.6* 33.4*  MCV 83.5 84.2 84.6  PLT 242 264 226   Cardiac Enzymes: No results found for this basename: CKTOTAL, CKMB, CKMBINDEX, TROPONINI,  in the last 168 hours BNP (last 3 results)  Recent Labs  11/11/12 0108 01/14/13 2215  07/24/13 1322  PROBNP 354.6 386.4 439.5   CBG:  Recent Labs Lab 07/25/13 1150 07/25/13 1704 07/25/13 2102 07/26/13 0716 07/26/13 1143  GLUCAP 89 128* 150* 84 96    No results found for this or any previous visit (from the past 240 hour(s)).   Studies: Dg Chest Port 1 View  07/26/2013   CLINICAL DATA:  Wheezing and weakness  EXAM: PORTABLE CHEST - 1 VIEW  COMPARISON:  07/24/2013  FINDINGS: Moderate cardiac enlargement stable. Mild vascular congestion with no evidence of actual pulmonary edema. Mild discoid atelectasis bilateral lower lung zones.  IMPRESSION: No change from prior study. No  acute findings.   Electronically Signed   By: Esperanza Heir M.D.   On: 07/26/2013 13:34    Scheduled Meds: . amLODipine  5 mg Oral Daily  . docusate sodium  100 mg Oral BID  . enoxaparin (LOVENOX) injection  40 mg Subcutaneous Q24H  . insulin aspart  0-15 Units Subcutaneous TID WC  . insulin aspart  0-5 Units Subcutaneous QHS  . pantoprazole  40 mg Oral Daily  . simvastatin  20 mg Oral QHS  . sodium chloride  1 g Oral TID WC  . warfarin  10 mg Oral Once  . Warfarin - Pharmacist Dosing Inpatient   Does not apply Q24H   Continuous Infusions: . sodium chloride 50 mL/hr at 07/26/13 0310    Principal Problem:   Hypokalemia Active Problems:   DIABETES MELLITUS, TYPE II   HYPERLIPIDEMIA   HYPERTENSION   Atrial fibrillation, chronic   GERD   Hyponatremia   Weakness   Dehydration   Bradycardia    Time spent: 30 minutes    MEMON,JEHANZEB  [name] Pager [phone]. If 7PM-7AM, please contact night-coverage at [email or web address], password [facility] 07/26/2013, 3:01 PM  LOS: 2 days

## 2013-07-26 NOTE — Progress Notes (Signed)
ANTICOAGULATION CONSULT NOTE  Pharmacy Consult for Warfarin Indication: atrial fibrillation  Allergies  Allergen Reactions  . Nsaids     Bleeding ulcer  . Codeine Nausea And Vomiting  . Adhesive [Tape] Rash    Redness, and peeling skin off.     Patient Measurements: Height: 5\' 5"  (165.1 cm) Weight: 203 lb 4.2 oz (92.2 kg) IBW/kg (Calculated) : 57  Vital Signs: Temp: 97.8 F (36.6 C) (10/25 0534) Temp src: Oral (10/25 0534) BP: 113/52 mmHg (10/25 0534) Pulse Rate: 98 (10/25 0534)  Labs:  Recent Labs  07/24/13 1322 07/24/13 2216 07/25/13 0448 07/26/13 0557  HGB 13.3  --  12.2 11.6*  HCT 36.9  --  34.6* 33.4*  PLT 242  --  264 226  APTT 50*  --   --   --   LABPROT 19.7*  --  20.5* 19.7*  INR 1.72*  --  1.82* 1.72*  CREATININE 0.69 0.66 0.70 0.63    Estimated Creatinine Clearance: 64 ml/min (by C-G formula based on Cr of 0.63).   Medical History: Past Medical History  Diagnosis Date  . Diabetes mellitus   . Hyperlipidemia   . Eosinophilia   . Cataract   . Hypertension   . Atrial fibrillation prior to 2008    moderate biatrial enlargement in 2009; mild to moderate LVH with a low-normal EF  . GERD (gastroesophageal reflux disease)     Hemorrhoids; history of peptic ulcer disease  . DJD (degenerative joint disease) of knee   . Sleep apnea     CPAP  . Chronic anticoagulation   . Aortic valve disease     2009: mild stenosis and regurgitation; peak gradient of 30-35 mmHg , subsequent [facility]R at [facility]  . Incontinence     Mild  . Carcinoma of breast     Right mastectomy; radiation and hormonal therapy  . Hip fracture, left 12/12/2012  . Foot injury 06/25/2012  . Degenerative joint disease of knee 01/29/2008    Bilateral    . BREAST CANCER, HX OF 01/29/2008    Lumpectomy, radiation therapy  Arimidex stopped in July 2013    . Syncope and collapse 11/11/2012  . Diverticulosis   . Umbilical hernia   . Cholelithiasis   . DDD (degenerative disc disease), lumbar   .  Vertebral compression fracture     lumbar  . Chronic pain   . Paresthesias     feet    Medications:  Scheduled:  . amLODipine  5 mg Oral Daily  . docusate sodium  100 mg Oral BID  . enoxaparin (LOVENOX) injection  40 mg Subcutaneous Q24H  . insulin aspart  0-15 Units Subcutaneous TID WC  . insulin aspart  0-5 Units Subcutaneous QHS  . pantoprazole  40 mg Oral Daily  . simvastatin  20 mg Oral QHS  . Warfarin - Pharmacist Dosing Inpatient   Does not apply Q24H    Assessment: Continuation of warfarin from home for atrial fibrillation PTA Warfarin 7.5 mg Tues,Thurs,Sat,Sun, 5 mg Mon,Wed,Fri INR subtherapeutic this AM  @ 1.72.  Goal of Therapy:  INR 2-3   Plan:  Warfarin 10 mg po today. INR/PT daily Also on SQ Lovenox 40mg  daily.  Lamonte Richer R 07/26/2013,11:03 AM

## 2013-07-27 LAB — CBC
HCT: 35.1 % — ABNORMAL LOW (ref 36.0–46.0)
MCH: 29.8 pg (ref 26.0–34.0)
MCHC: 34.8 g/dL (ref 30.0–36.0)
Platelets: 222 10*3/uL (ref 150–400)
RBC: 4.1 MIL/uL (ref 3.87–5.11)
RDW: 14.5 % (ref 11.5–15.5)

## 2013-07-27 LAB — GLUCOSE, CAPILLARY

## 2013-07-27 LAB — BASIC METABOLIC PANEL
Calcium: 8.9 mg/dL (ref 8.4–10.5)
GFR calc Af Amer: >90 mL/min (ref >90)
GFR calc non Af Amer: 84 mL/min — ABNORMAL LOW (ref >90)
Sodium: 132 mEq/L — ABNORMAL LOW (ref 135–145)

## 2013-07-27 LAB — PROTIME-INR: Prothrombin Time: 21.1 seconds — ABNORMAL HIGH (ref 11.6–15.2)

## 2013-07-27 MED ORDER — WARFARIN SODIUM 7.5 MG PO TABS: 7.5000 mg | ORAL_TABLET | Freq: Once | ORAL | Status: DC

## 2013-07-27 MED ORDER — SODIUM CHLORIDE 1 G PO TABS: 1.0000 g | ORAL_TABLET | Freq: Two times a day (BID) | ORAL | Status: DC

## 2013-07-27 MED ORDER — POTASSIUM CHLORIDE CRYS ER 20 MEQ PO TBCR
40.0000 meq | EXTENDED_RELEASE_TABLET | Freq: Once | ORAL | Status: AC
  Administered 2013-07-27: 40 meq via ORAL
  Filled 2013-07-27: qty 2

## 2013-07-27 NOTE — Progress Notes (Signed)
ANTICOAGULATION CONSULT NOTE  Pharmacy Consult for Warfarin Indication: atrial fibrillation  Allergies  Allergen Reactions  . Nsaids     Bleeding ulcer  . Codeine Nausea And Vomiting  . Adhesive [Tape] Rash    Redness, and peeling skin off.     Patient Measurements: Height: 5\' 5"  (165.1 cm) Weight: 199 lb 15.3 oz (90.7 kg) IBW/kg (Calculated) : 57  Vital Signs: Temp: 97.6 F (36.4 C) (10/26 0516) Temp src: Oral (10/26 0516) BP: 140/61 mmHg (10/26 0516) Pulse Rate: 55 (10/26 0516)  Labs:  Recent Labs  07/24/13 1322  07/25/13 0448 07/26/13 0557 07/27/13 0545  HGB 13.3  --  12.2 11.6* 12.2  HCT 36.9  --  34.6* 33.4* 35.1*  PLT 242  --  264 226 222  APTT 50*  --   --   --   --   LABPROT 19.7*  --  20.5* 19.7* 21.1*  INR 1.72*  --  1.82* 1.72* 1.89*  CREATININE 0.69  < > 0.70 0.63 0.62  < > = values in this interval not displayed.  Estimated Creatinine Clearance: 63.5 ml/min (by C-G formula based on Cr of 0.62).   Medical History: Past Medical History  Diagnosis Date  . Diabetes mellitus   . Hyperlipidemia   . Eosinophilia   . Cataract   . Hypertension   . Atrial fibrillation prior to 2008    moderate biatrial enlargement in 2009; mild to moderate LVH with a low-normal EF  . GERD (gastroesophageal reflux disease)     Hemorrhoids; history of peptic ulcer disease  . DJD (degenerative joint disease) of knee   . Sleep apnea     CPAP  . Chronic anticoagulation   . Aortic valve disease     2009: mild stenosis and regurgitation; peak gradient of 30-35 mmHg , subsequent AVR at [facility]  . Incontinence     Mild  . Carcinoma of breast     Right mastectomy; radiation and hormonal therapy  . Hip fracture, left 12/12/2012  . Foot injury 06/25/2012  . Degenerative joint disease of knee 01/29/2008    Bilateral    . BREAST CANCER, HX OF 01/29/2008    Lumpectomy, radiation therapy  Arimidex stopped in July 2013    . Syncope and collapse 11/11/2012  . Diverticulosis   .  Umbilical hernia   . Cholelithiasis   . DDD (degenerative disc disease), lumbar   . Vertebral compression fracture     lumbar  . Chronic pain   . Paresthesias     feet    Medications:  Scheduled:  . amLODipine  5 mg Oral Daily  . docusate sodium  100 mg Oral BID  . enoxaparin (LOVENOX) injection  40 mg Subcutaneous Q24H  . insulin aspart  0-15 Units Subcutaneous TID WC  . insulin aspart  0-5 Units Subcutaneous QHS  . pantoprazole  40 mg Oral Daily  . simvastatin  20 mg Oral QHS  . sodium chloride  1 g Oral TID WC  . Warfarin - Pharmacist Dosing Inpatient   Does not apply Q24H    Assessment: Continuation of warfarin from home for atrial fibrillation PTA Warfarin 7.5 mg Tues,Thurs,Sat,Sun, 5 mg Mon,Wed,Fri INR subtherapeutic this AM  @ 1.89.  Goal of Therapy:  INR 2-3   Plan:  Warfarin 7.5 mg po today. INR/PT daily Also on SQ Lovenox 40mg  daily.  Lamonte Richer R 07/27/2013,9:15 AM

## 2013-07-27 NOTE — Progress Notes (Signed)
07/27/13 1241 Reviewed discharge instructions with patient, son and daughter-in-law at bedside. Given copy of instructions, medication list, prescription, f/u appointment information. State they will call Dr. Deirdre Peer office Monday to schedule appointment. Reviewed hyponatremia education sheet and when to call MD. Verbalized understanding of instructions. IV site d/c'd and within normal limits. Home health set up with [facility]. Notified [facility] of discharge today, faxed home health order and d/c summary as requested. Pt left floor in stable condition via w/c accompanied by nurse tech. Earnstine Regal, RN

## 2013-07-27 NOTE — Discharge Summary (Signed)
Physician Discharge Summary  Kristin Logan AOZ:[id number] DOB: [birth year] DOA: 07/24/2013  PCP: [name], MD  Admit date: 07/24/2013 Discharge date: 07/27/2013  Time spent: 45 minutes  Recommendations for Outpatient Follow-up:  1. Follow up with primary care doctor in 1 week 2. Have home health RN check BMET on 10/31 with results sent to PCP 3. Patient started on salt tabs for hyponatremia.  Advised family to observe for signs of CHF 4. Beta blocker held due to bradycardia (heart rate in the 40s) 5. Follow up with Dr. Hilda Lias as needed for arthritis of knees 6. Follow up with Dr. Beulah Gandy as needed for atrial fib  Discharge Diagnoses:  Principal Problem:   Hypokalemia Active Problems:   DIABETES MELLITUS, TYPE II   HYPERLIPIDEMIA   HYPERTENSION   Atrial fibrillation, chronic   GERD   Hyponatremia   Weakness   Dehydration   Bradycardia   Discharge Condition: improved  Diet recommendation: regular  Filed Weights   07/25/13 0413 07/26/13 0534 07/27/13 0410  Weight: 92.5 kg (203 lb 14.8 oz) 92.2 kg (203 lb 4.2 oz) 90.7 kg (199 lb 15.3 oz)    History of present illness:  Kristin Logan is a 79 y.o. female with a history of hypertension, atrial fibrillation, status post aVR with bioprosthetic device, on chronic anticoagulation with Coumadin, hypothyroidism, depression who came to the emergency department today with the chief complaint of one-week history of progressive worsening weakness. Information is obtained from the patient and her daughter who is at the bedside. Daughter reports over the last 7 days patient has been calm him increasingly weak her. She has taken less and less by mouth. This morning weakness had progressed to the point the patient was unable to stand. Yesterday morning patient had a near fall while going to the bathroom. She denies dizziness lightheadedness syncope or near-syncope. She states that her legs just got too weak to hold her. She also  developed 2 days ago nausea without vomiting. In addition she has experienced worsening shortness of breath with exertion. Daughter indicates that patient's weight has gone from 186-195 over the last 10 days. She denies chest pain palpitations cough fever chills. She denies abdominal pain dysuria hematuria frequency or urgency. She does endorse constipation and reports her last BM was 6 days ago. Evaluation in the emergency room is significant for sodium level of 124, potassium of 3.1, INR 1.72 chest x-ray yields cardiac enlargement and mild vascular congestion but no edema or effusions. EKG with undetermined rhythm. Vital signs are significant for a heart rate of 45-60 otherwise vital signs are stable. We are asked to admit for further evaluation and treatment   Hospital Course:  This patient was admitted for generalized weakness, nausea and vomiting, hyponatremia. The patient has had repeated admissions for hyponatremia. Her family reports that she was generally weak and had decreased by mouth intake for quite some time. Clinically, she did appear somewhat dehydrated. She was started on intravenous saline with some improvement in her sodium levels. She did start to develop some vascular congestion on chest x-ray and therefore fluids were discontinued she was started on Lasix. She was also given salt tablets to help with her hypovolemic hyponatremia. Her sodium levels have improved to 132 and patient is clinically improved. Her weakness has improved her nausea and vomiting has resolved. Her by mouth intake is also improving. She was noted to be bradycardic with a heart rate in the low 40s. It was noted that she was on Toprol  prior to admission. This has since been discontinued. This can be further evaluated in the outpatient setting. She is continued on Coumadin for atrial fibrillation. Is recommended that she have a repeat basic metabolic panel drawn on 10/31 with results sent to her primary care physician.  Determination to continue with salt tabs can be made at that time. She is stable for discharge home.  Procedures:  none  Consultations:  none  Discharge Exam: Filed Vitals:   07/27/13 0516  BP: 140/61  Pulse: 55  Temp: 97.6 F (36.4 C)  Resp: 18    General: NAD Cardiovascular: S1, S2 RRR Respiratory: CTA B  Discharge Instructions  Discharge Orders   Future Orders Complete By Expires   (HEART FAILURE PATIENTS) Call MD:  Anytime you have any of the following symptoms: 1) 3 pound weight gain in 24 hours or 5 pounds in 1 week 2) shortness of breath, with or without a dry hacking cough 3) swelling in the hands, feet or stomach 4) if you have to sleep on extra pillows at night in order to breathe.  As directed    (HEART FAILURE PATIENTS) Call MD:  Anytime you have any of the following symptoms: 1) 3 pound weight gain in 24 hours or 5 pounds in 1 week 2) shortness of breath, with or without a dry hacking cough 3) swelling in the hands, feet or stomach 4) if you have to sleep on extra pillows at night in order to breathe.  As directed    Call MD for:  extreme fatigue  As directed    Diet - low sodium heart healthy  As directed    Diet - low sodium heart healthy  As directed    Face-to-face encounter (required for Medicare/Medicaid patients)  As directed    Comments:     I [name] certify that this patient is under my care and that I, or a nurse practitioner or physician's assistant working with me, had a face-to-face encounter that meets the physician face-to-face encounter requirements with this patient on 07/27/2013. The encounter with the patient was in whole, or in part for the following medical condition(s) which is the primary reason for home health care (List medical condition): repeated hospitalizations for hyponatremia.  Please have home health RN draw BMET on 10/31 with results sent to [name]   Questions:     The encounter with the patient was in whole, or in part,  for the following medical condition, which is the primary reason for home health care:  hyponatremia   I certify that, based on my findings, the following services are medically necessary home health services:  Nursing   My clinical findings support the need for the above services:  Unable to leave home safely without assistance and/or assistive device   Further, I certify that my clinical findings support that this patient is homebound due to:  Unable to leave home safely without assistance   Reason for Medically Necessary Home Health Services:  Skilled Nursing- Lab Monitoring Requiring Frequent Medication Adjustment   Home Health  As directed    Comments:     Check BMET on 08/01/13 with results sent to pcp [name]   Questions:     To provide the following care/treatments:  RN   Increase activity slowly  As directed    Increase activity slowly  As directed        Medication List    STOP taking these medications  metoprolol succinate 25 MG 24 hr tablet  Commonly known as:  TOPROL-XL      TAKE these medications       acetaminophen 650 MG CR tablet  Commonly known as:  TYLENOL  Take 1 tablet (650 mg total) by mouth every 8 (eight) hours as needed for pain.     albuterol 108 (90 BASE) MCG/ACT inhaler  Commonly known as:  PROVENTIL HFA;VENTOLIN HFA  Inhale 2 puffs into the lungs every 4 (four) hours as needed for wheezing.     albuterol (2.5 MG/3ML) 0.083% nebulizer solution  Commonly known as:  PROVENTIL  Take 3 mLs (2.5 mg total) by nebulization every 6 (six) hours as needed for wheezing.     amLODipine 5 MG tablet  Commonly known as:  NORVASC  Take 1 tablet (5 mg total) by mouth daily.     CENTRUM SILVER ULTRA WOMENS PO  Take 1 tablet by mouth daily.     clotrimazole-betamethasone cream  Commonly known as:  LOTRISONE  Apply 1 application topically daily.     diclofenac sodium 1 % Gel  Commonly known as:  VOLTAREN  Apply 1 application topically daily as needed  (for knee pain).     docusate sodium 100 MG capsule  Commonly known as:  COLACE  Take 100 mg by mouth 2 (two) times daily as needed for constipation.     EX-LAX PO  Take 2 tablets by mouth daily as needed (constipation).     HYDROcodone-acetaminophen 5-325 MG per tablet  Commonly known as:  NORCO  Take 1 tablet by mouth every 6 (six) hours as needed for pain.     HYDROcodone-acetaminophen 5-325 MG per tablet  Commonly known as:  NORCO/VICODIN  Take 1 tablet by mouth at bedtime as needed for pain.     omeprazole 20 MG capsule  Commonly known as:  PRILOSEC  Take 1 capsule (20 mg total) by mouth 2 (two) times daily.     simvastatin 20 MG tablet  Commonly known as:  ZOCOR  Take 1 tablet (20 mg total) by mouth at bedtime.     sodium chloride 1 G tablet  Take 1 tablet (1 g total) by mouth 2 (two) times daily.     warfarin 5 MG tablet  Commonly known as:  COUMADIN  5-7.5 mg See admin instructions. Take one tablet (5 mg) on Monday, Wednesday, and Friday. Takes 1.5 tabs (7.5 mg) on Tuesday Thursday Saturday and Sunday       Allergies  Allergen Reactions  . Nsaids     Bleeding ulcer  . Codeine Nausea And Vomiting  . Adhesive [Tape] Rash    Redness, and peeling skin off.        Follow-up Information   Follow up with [facility].   Contact information:   [address] [place] [address] [phone]      Follow up with [name], MD. Schedule an appointment as soon as possible for a visit in 1 week.   Specialty:  Family Medicine   Contact information:   [address] [place] [address] [phone]        The results of significant diagnostics from this hospitalization (including imaging, microbiology, ancillary and laboratory) are listed below for reference.    Significant Diagnostic Studies: Dg Chest Port 1 View  07/26/2013   CLINICAL DATA:  Wheezing and weakness  EXAM: PORTABLE CHEST - 1 VIEW  COMPARISON:  07/24/2013  FINDINGS:  Moderate cardiac  enlargement stable. Mild vascular congestion with no evidence of actual pulmonary edema. Mild discoid atelectasis bilateral lower lung zones.  IMPRESSION: No change from prior study. No acute findings.   Electronically Signed   By: Esperanza Heir M.D.   On: 07/26/2013 13:34   Dg Chest Portable 1 View  07/24/2013   CLINICAL DATA:  Shortness of breath and weakness.  EXAM: PORTABLE CHEST - 1 VIEW  COMPARISON:  05/06/2013.  FINDINGS: The heart is enlarged but stable. The mediastinal and hilar contours are unchanged. There is mild vascular congestion but no overt pulmonary edema. No pleural effusion or focal infiltrate. The bony thorax is intact.  IMPRESSION: Cardiac enlargement and mild vascular congestion but no edema or effusions.   Electronically Signed   By: Loralie Champagne M.D.   On: 07/24/2013 14:00    Microbiology: No results found for this or any previous visit (from the past 240 hour(s)).   Labs: Basic Metabolic Panel:  Recent Labs Lab 07/24/13 1322 07/24/13 2216 07/25/13 0448 07/26/13 0557 07/27/13 0545  NA 124* 121* 126* 128* 132*  K 3.1* 3.2* 4.8 3.5 3.2*  CL 88* 88* 94* 95* 95*  CO2 26 25 25 25 28   GLUCOSE 90 123* 77 96 78  BUN 4* 5* 5* 6 6  CREATININE 0.69 0.66 0.70 0.63 0.62  CALCIUM 9.0 8.5 8.8 8.5 8.9  MG 2.3  --   --   --   --    Liver Function Tests:  Recent Labs Lab 07/24/13 1322 07/25/13 0448  AST 29 24  ALT 7 7  ALKPHOS 54 48  BILITOT 0.4 0.4  PROT 7.0 6.3  ALBUMIN 3.8 3.5   No results found for this basename: LIPASE, AMYLASE,  in the last 168 hours No results found for this basename: AMMONIA,  in the last 168 hours CBC:  Recent Labs Lab 07/24/13 1322 07/25/13 0448 07/26/13 0557 07/27/13 0545  WBC 5.4 6.3 5.2 4.6  NEUTROABS 2.1  --   --   --   HGB 13.3 12.2 11.6* 12.2  HCT 36.9 34.6* 33.4* 35.1*  MCV 83.5 84.2 84.6 85.6  PLT 242 264 226 222   Cardiac Enzymes: No results found for this basename: CKTOTAL, CKMB, CKMBINDEX,  TROPONINI,  in the last 168 hours BNP: BNP (last 3 results)  Recent Labs  11/11/12 0108 01/14/13 2215 07/24/13 1322  PROBNP 354.6 386.4 439.5   CBG:  Recent Labs Lab 07/26/13 1143 07/26/13 1632 07/26/13 2112 07/27/13 0729 07/27/13 1138  GLUCAP 96 123* 88 77 91       Signed:  Malai Lady  [name] 07/27/2013, 11:45 AM

## 2013-08-01 ENCOUNTER — Ambulatory Visit (INDEPENDENT_AMBULATORY_CARE_PROVIDER_SITE_OTHER): Payer: Medicare Other | Admitting: *Deleted

## 2013-08-01 DIAGNOSIS — I482 Chronic atrial fibrillation, unspecified: Secondary | ICD-10-CM

## 2013-08-01 DIAGNOSIS — I4891 Unspecified atrial fibrillation: Secondary | ICD-10-CM

## 2013-08-01 DIAGNOSIS — Z7901 Long term (current) use of anticoagulants: Secondary | ICD-10-CM

## 2013-08-01 LAB — POCT INR: INR: 2.5

## 2013-08-04 ENCOUNTER — Emergency Department (HOSPITAL_COMMUNITY)
Admission: EM | Admit: 2013-08-04 | Discharge: 2013-08-04 | Disposition: A | Discharge status: Home / Self Care | Payer: Medicare Other | Attending: Emergency Medicine | Admitting: Emergency Medicine

## 2013-08-04 ENCOUNTER — Encounter (HOSPITAL_COMMUNITY): Payer: Self-pay | Admitting: Emergency Medicine

## 2013-08-04 ENCOUNTER — Ambulatory Visit: Payer: Medicare Other | Admitting: Family Medicine

## 2013-08-04 ENCOUNTER — Emergency Department (HOSPITAL_COMMUNITY): Payer: Medicare Other

## 2013-08-04 DIAGNOSIS — G8929 Other chronic pain: Secondary | ICD-10-CM | POA: Insufficient documentation

## 2013-08-04 DIAGNOSIS — I10 Essential (primary) hypertension: Secondary | ICD-10-CM | POA: Insufficient documentation

## 2013-08-04 DIAGNOSIS — Z853 Personal history of malignant neoplasm of breast: Secondary | ICD-10-CM | POA: Insufficient documentation

## 2013-08-04 DIAGNOSIS — K5900 Constipation, unspecified: Secondary | ICD-10-CM | POA: Insufficient documentation

## 2013-08-04 DIAGNOSIS — IMO0002 Reserved for concepts with insufficient information to code with codable children: Secondary | ICD-10-CM | POA: Insufficient documentation

## 2013-08-04 DIAGNOSIS — M5137 Other intervertebral disc degeneration, lumbosacral region: Secondary | ICD-10-CM | POA: Insufficient documentation

## 2013-08-04 DIAGNOSIS — E119 Type 2 diabetes mellitus without complications: Secondary | ICD-10-CM | POA: Insufficient documentation

## 2013-08-04 DIAGNOSIS — Z79899 Other long term (current) drug therapy: Secondary | ICD-10-CM | POA: Insufficient documentation

## 2013-08-04 DIAGNOSIS — Z8711 Personal history of peptic ulcer disease: Secondary | ICD-10-CM | POA: Insufficient documentation

## 2013-08-04 DIAGNOSIS — M51379 Other intervertebral disc degeneration, lumbosacral region without mention of lumbar back pain or lower extremity pain: Secondary | ICD-10-CM | POA: Insufficient documentation

## 2013-08-04 DIAGNOSIS — M1710 Unilateral primary osteoarthritis, unspecified knee: Secondary | ICD-10-CM | POA: Insufficient documentation

## 2013-08-04 DIAGNOSIS — G4730 Sleep apnea, unspecified: Secondary | ICD-10-CM | POA: Insufficient documentation

## 2013-08-04 DIAGNOSIS — Z7901 Long term (current) use of anticoagulants: Secondary | ICD-10-CM | POA: Insufficient documentation

## 2013-08-04 DIAGNOSIS — Z8781 Personal history of (healed) traumatic fracture: Secondary | ICD-10-CM | POA: Insufficient documentation

## 2013-08-04 DIAGNOSIS — E785 Hyperlipidemia, unspecified: Secondary | ICD-10-CM | POA: Insufficient documentation

## 2013-08-04 DIAGNOSIS — Z87828 Personal history of other (healed) physical injury and trauma: Secondary | ICD-10-CM | POA: Insufficient documentation

## 2013-08-04 DIAGNOSIS — K219 Gastro-esophageal reflux disease without esophagitis: Secondary | ICD-10-CM | POA: Insufficient documentation

## 2013-08-04 DIAGNOSIS — I4891 Unspecified atrial fibrillation: Secondary | ICD-10-CM | POA: Insufficient documentation

## 2013-08-04 LAB — CBC WITH DIFFERENTIAL/PLATELET
Basophils Relative: 0 % (ref 0–1)
Eosinophils Absolute: 0.1 10*3/uL (ref 0.0–0.7)
Eosinophils Relative: 1 % (ref 0–5)
HCT: 41.9 % (ref 36.0–46.0)
Hemoglobin: 14.3 g/dL (ref 12.0–15.0)
Lymphocytes Relative: 19 % (ref 12–46)
Monocytes Relative: 7 % (ref 3–12)
Neutro Abs: 10.5 10*3/uL — ABNORMAL HIGH (ref 1.7–7.7)
Neutrophils Relative %: 73 % (ref 43–77)
RBC: 4.83 MIL/uL (ref 3.87–5.11)
WBC: 14.3 10*3/uL — ABNORMAL HIGH (ref 4.0–10.5)

## 2013-08-04 LAB — BASIC METABOLIC PANEL
CO2: 27 mEq/L (ref 19–32)
Calcium: 9.5 mg/dL (ref 8.4–10.5)
GFR calc Af Amer: >90 mL/min (ref >90)
Glucose, Bld: 105 mg/dL — ABNORMAL HIGH (ref 70–99)
Potassium: 3.4 mEq/L — ABNORMAL LOW (ref 3.5–5.1)
Sodium: 134 mEq/L — ABNORMAL LOW (ref 135–145)

## 2013-08-04 MED ORDER — GLYCERIN (LAXATIVE) 2 G RE SUPP: 1.0000 | Freq: Once | RECTAL | Status: DC

## 2013-08-04 MED ORDER — HYDROCODONE-ACETAMINOPHEN 5-325 MG PO TABS
1.0000 | ORAL_TABLET | Freq: Once | ORAL | Status: AC
  Administered 2013-08-04: 1 via ORAL
  Filled 2013-08-04: qty 1

## 2013-08-04 MED ORDER — ONDANSETRON 4 MG PO TBDP
4.0000 mg | ORAL_TABLET | Freq: Once | ORAL | Status: AC
  Administered 2013-08-04: 4 mg via ORAL
  Filled 2013-08-04: qty 1

## 2013-08-04 NOTE — ED Provider Notes (Signed)
CSN: [id number]     Arrival date & time 08/04/13  1035 History  This chart was scribed for [name], MD by Bennett Scrape, ED Scribe. This patient was seen in room APA09/APA09 and the patient's care was started at 12:11 PM.   Chief Complaint  Patient presents with  . Constipation   Level 5 Caveat- Pt uncomfortable, moaning, won't answer questions  The history is provided by a relative. No language interpreter was used.    HPI Comments: Kristin Logan is a 79 y.o. female with a h/o hemorrhoids who presents to the Emergency Department complaining of constipation for the past 9 days with associated nausea and new onset of anal pain yesterday. Pt was discharged from [facility] one week ago after being admitted on 07/24/13 (3 day admission) due to hypokalemia. She was taken off of one of her HTN medications and put on sodium tabs with improvement. After recent blood work done by Astronomer, pt was told to stop the sodium tabs. She has not had a normal BM since 07/26/13. Family states that the stool is "dry, crumbly" and comes out in pieces. She has taken Doculax suppositories and Ex-lax with no improvement. She also takes generic stool softeners daily due to having constipation for the past year. No Magnesium citrate was tried. Family has brought the pt to the ED due to new onset of anal pain that they believe is due to her attempting to manually pull stool out. She is currently on pain medication for knee pain. Last dose was 2 days ago. She has no h/o abdominal surgeries. Pt is currently on Coumadin and family states that her INR levels have been at 2.5 recently.   PCP is [name].  Past Medical History  Diagnosis Date  . Diabetes mellitus   . Hyperlipidemia   . Eosinophilia   . Cataract   . Hypertension   . Atrial fibrillation prior to 2008    moderate biatrial enlargement in 2009; mild to moderate LVH with a low-normal EF  . GERD (gastroesophageal reflux disease)     Hemorrhoids;  history of peptic ulcer disease  . DJD (degenerative joint disease) of knee   . Sleep apnea     CPAP  . Chronic anticoagulation   . Aortic valve disease     2009: mild stenosis and regurgitation; peak gradient of 30-35 mmHg , subsequent AVR at [facility]  . Incontinence     Mild  . Carcinoma of breast     Right mastectomy; radiation and hormonal therapy  . Hip fracture, left 12/12/2012  . Foot injury 06/25/2012  . Degenerative joint disease of knee 01/29/2008    Bilateral    . BREAST CANCER, HX OF 01/29/2008    Lumpectomy, radiation therapy  Arimidex stopped in July 2013    . Syncope and collapse 11/11/2012  . Diverticulosis   . Umbilical hernia   . Cholelithiasis   . DDD (degenerative disc disease), lumbar   . Vertebral compression fracture     lumbar  . Chronic pain   . Paresthesias     feet   Past Surgical History  Procedure Laterality Date  . Exploration post operative open heart    . Colonoscopy      Approximately 2000  . Cardiac valve replacement      [facility]  . Cardiac valve replacement    . Breast surgery    . Breast lumpectomy    . Esophagogastroduodenoscopy N/A 02/20/2013    Procedure: ESOPHAGOGASTRODUODENOSCOPY (EGD);  Surgeon: Malissa Hippo, MD;  Location: [facility] ENDO [facility];  Service: Endoscopy;  Laterality: N/A;   Family History  Problem Relation Age of Onset  . Heart disease Mother   . Stroke Father   . Cancer Sister   . Heart disease Sister   . Stroke Sister   . Stroke Brother    History  Substance Use Topics  . Smoking status: Never Smoker   . Smokeless tobacco: Not on file  . Alcohol Use: No   No OB history provided.  Review of Systems  Unable to perform ROS: Other    Allergies  Nsaids; Codeine; and Adhesive  Home Medications   Current Outpatient Rx  Name  Route  Sig  Dispense  Refill  . acetaminophen (TYLENOL) 650 MG CR tablet   Oral   Take 1 tablet (650 mg total) by mouth every 8 (eight) hours as needed for pain.         Marland Kitchen albuterol  (PROVENTIL HFA;VENTOLIN HFA) 108 (90 BASE) MCG/ACT inhaler   Inhalation   Inhale 2 puffs into the lungs every 4 (four) hours as needed for wheezing.   1 Inhaler   2   . albuterol (PROVENTIL) (2.5 MG/3ML) 0.083% nebulizer solution   Nebulization   Take 3 mLs (2.5 mg total) by nebulization every 6 (six) hours as needed for wheezing.   75 mL   3   . amLODipine (NORVASC) 5 MG tablet   Oral   Take 1 tablet (5 mg total) by mouth daily.   30 tablet   2   . clotrimazole-betamethasone (LOTRISONE) cream   Topical   Apply 1 application topically daily.         . diclofenac sodium (VOLTAREN) 1 % GEL   Topical   Apply 1 application topically daily as needed (for knee pain).         Marland Kitchen docusate sodium (COLACE) 100 MG capsule   Oral   Take 100 mg by mouth 2 (two) times daily as needed for constipation.          Marland Kitchen HYDROcodone-acetaminophen (NORCO/VICODIN) 5-325 MG per tablet   Oral   Take 1 tablet by mouth at bedtime as needed for pain.         . Multiple Vitamins-Minerals (CENTRUM SILVER ULTRA WOMENS PO)   Oral   Take 1 tablet by mouth daily.          Marland Kitchen omeprazole (PRILOSEC) 20 MG capsule   Oral   Take 1 capsule (20 mg total) by mouth 2 (two) times daily.   60 capsule   5   . Sennosides (EX-LAX PO)   Oral   Take 2 tablets by mouth daily as needed (constipation).         . simvastatin (ZOCOR) 20 MG tablet   Oral   Take 1 tablet (20 mg total) by mouth at bedtime.   30 tablet   6   . sodium chloride 1 G tablet   Oral   Take 1 tablet (1 g total) by mouth 2 (two) times daily.   60 tablet   0   . warfarin (COUMADIN) 5 MG tablet   Oral   Take 5-7.5 mg by mouth See admin instructions. Take one tablet (5 mg) on Monday, Wednesday, and Friday. Takes 1.5 tabs (7.5 mg) on Tuesday Thursday Saturday and Sunday         . glycerin adult (GLYCERIN ADULT) 2 G SUPP   Rectal   Place 1 suppository rectally  once.   20 each   2    Triage Vitals: BP 127/73  Pulse 75   Temp(Src) 97.4 F (36.3 C) (Oral)  Resp 16  Ht 5\' 5"  (1.651 m)  Wt 197 lb (89.359 kg)  BMI 32.78 kg/m2  SpO2 100%  Physical Exam  Nursing note and vitals reviewed. Constitutional: She is oriented to person, place, and time. She appears well-developed and well-nourished. No distress.  Uncomfortable, tearful   HENT:  Head: Normocephalic and atraumatic.  Mouth/Throat: Oropharynx is clear and moist.  Moist MM  Eyes: Conjunctivae and EOM are normal. Pupils are equal, round, and reactive to light.  Sclera are clear  Neck: Neck supple. No tracheal deviation present.  Cardiovascular: Normal rate and regular rhythm.   No murmur heard. Pulses:      Dorsalis pedis pulses are 2+ on the right side, and 2+ on the left side.  Pulmonary/Chest: Effort normal and breath sounds normal. No respiratory distress. She has no wheezes.  Abdominal: Soft. Bowel sounds are normal. She exhibits no distension. There is no tenderness.  Genitourinary:  Rectal exam: mild amount of perianal irritation, stool protruding from the anus, no external hemorrhoids or internal hemorrhoids, no anal fissures, no internal hemorrhoids palpated, chaperone present  Musculoskeletal: Normal range of motion. She exhibits no edema (no ankle swelling).  Lymphadenopathy:    She has no cervical adenopathy.  Neurological: She is alert and oriented to person, place, and time. No cranial nerve deficit.  Pt able to move both sets of fingers and toes  Skin: Skin is warm and dry. No rash noted.  Psychiatric: She has a normal mood and affect. Her behavior is normal.    ED Course  Procedures (including critical care time)  Medications  HYDROcodone-acetaminophen (NORCO/VICODIN) 5-325 MG per tablet 1 tablet (1 tablet Oral Given 08/04/13 1303)  ondansetron (ZOFRAN-ODT) disintegrating tablet 4 mg (4 mg Oral Given 08/04/13 1303)    DIAGNOSTIC STUDIES: Oxygen Saturation is 100% on room air, normal by my interpretation.    COORDINATION OF  CARE: 12:48 PM-Discussed treatment plan which includes abdomen x-ray, CBC panel and BMP with pt at bedside and pt agreed to plan. Disimpacted pt with a small amount of stool but stopped exam due to pt's discomfort.  Labs Review Labs Reviewed  CBC WITH DIFFERENTIAL - Abnormal; Notable for the following:    WBC 14.3 (*)    Neutro Abs 10.5 (*)    All other components within normal limits  BASIC METABOLIC PANEL - Abnormal; Notable for the following:    Sodium 134 (*)    Potassium 3.4 (*)    Chloride 94 (*)    Glucose, Bld 105 (*)    GFR calc non Af Amer 78 (*)    All other components within normal limits   Results for orders placed during the hospital encounter of 08/04/13  CBC WITH DIFFERENTIAL      Result Value Range   WBC 14.3 (*) 4.0 - 10.5 K/uL   RBC 4.83  3.87 - 5.11 MIL/uL   Hemoglobin 14.3  12.0 - 15.0 g/dL   HCT 19.1  47.8 - 29.5 %   MCV 86.7  78.0 - 100.0 fL   MCH 29.6  26.0 - 34.0 pg   MCHC 34.1  30.0 - 36.0 g/dL   RDW 62.1  30.8 - 65.7 %   Platelets 239  150 - 400 K/uL   Neutrophils Relative % 73  43 - 77 %   Lymphocytes Relative 19  12 - 46 %   Monocytes Relative 7  3 - 12 %   Eosinophils Relative 1  0 - 5 %   Basophils Relative 0  0 - 1 %   Neutro Abs 10.5 (*) 1.7 - 7.7 K/uL   Lymphs Abs 2.7  0.7 - 4.0 K/uL   Monocytes Absolute 1.0  0.1 - 1.0 K/uL   Eosinophils Absolute 0.1  0.0 - 0.7 K/uL   Basophils Absolute 0.0  0.0 - 0.1 K/uL   WBC Morphology WHITE COUNT CONFIRMED ON SMEAR     Smear Review LARGE PLATELETS PRESENT    BASIC METABOLIC PANEL      Result Value Range   Sodium 134 (*) 135 - 145 mEq/L   Potassium 3.4 (*) 3.5 - 5.1 mEq/L   Chloride 94 (*) 96 - 112 mEq/L   CO2 27  19 - 32 mEq/L   Glucose, Bld 105 (*) 70 - 99 mg/dL   BUN 12  6 - 23 mg/dL   Creatinine, Ser 8.75  0.50 - 1.10 mg/dL   Calcium 9.5  8.4 - 64.3 mg/dL   GFR calc non Af Amer 78 (*) >90 mL/min   GFR calc Af Amer >90  >90 mL/min    Imaging Review Dg Abd Acute W/chest  08/04/2013    CLINICAL DATA:  Constipation for 1 week.  EXAM: ACUTE ABDOMEN SERIES (ABDOMEN 2 VIEW & CHEST 1 VIEW)  COMPARISON:  07/26/2013  FINDINGS: Heart is enlarged. There are perihilar bronchitic changes, stable in appearance. There are no focal consolidations or pleural effusions. No free intraperitoneal air. Surgical clips overlie the right axillary region. Supine and erect views of the abdomen show mild gaseous distention of both large and small bowel loops, most consistent with ileus. There is a moderate amount of stool throughout colonic loops, including the cecum and rectosigmoid colon. Degenerative changes are seen in the spine. L2 wedge compression fracture is stable.  IMPRESSION: 1. Cardiomegaly and bronchitic changes. 2. No focal pulmonary abnormality identified. 3. Ileus. 4. Moderate stool burden. 5. Stable lumbar wedge compression fracture.   Electronically Signed   By: Rosalie Gums M.D.   On: 08/04/2013 13:53    EKG Interpretation   None       MDM   1. Constipation    Patient with history of constipation has been worse. Rectal exam showed moderate amount of stool in the rectal vault not particularly hard. No evidence of external hemorrhoids internal hemorrhoids not palpable or prolapse no anal fissure no excoriation around the perianal area. Patient was able to have a large bowel movement following the rectal digitalization to try to pull out as much stool as possible the patient was having trouble tolerating it she went on to have a bowel movement on around feels much better. Rest of workup without any significant findings. Patient will be discharged home with glycerin suppositories and recommendation for starting Metamucil.     I personally performed the services described in this documentation, which was scribed in my presence. The recorded information has been reviewed and is accurate.     [name], MD 08/04/13 [phone]

## 2013-08-04 NOTE — ED Notes (Signed)
Pt d/c last Saturday for chronic sodium. Last bm was oct 25th, day of dc. Pt c/o pressure to anal area and hard to pee due to pressure also. Pt states she was up all night with pain. grimicing noted. Has been taking pain meds once a day for knee pain. Has tried otc meds with no relief.

## 2013-08-06 ENCOUNTER — Ambulatory Visit (INDEPENDENT_AMBULATORY_CARE_PROVIDER_SITE_OTHER): Payer: Medicare Other | Admitting: Family Medicine

## 2013-08-06 VITALS — BP 90/80 | HR 68 | Temp 97.2°F | Resp 20

## 2013-08-06 DIAGNOSIS — K3184 Gastroparesis: Secondary | ICD-10-CM

## 2013-08-06 DIAGNOSIS — E871 Hypo-osmolality and hyponatremia: Secondary | ICD-10-CM

## 2013-08-06 DIAGNOSIS — E119 Type 2 diabetes mellitus without complications: Secondary | ICD-10-CM

## 2013-08-06 DIAGNOSIS — I959 Hypotension, unspecified: Secondary | ICD-10-CM

## 2013-08-06 DIAGNOSIS — E039 Hypothyroidism, unspecified: Secondary | ICD-10-CM

## 2013-08-06 DIAGNOSIS — K5900 Constipation, unspecified: Secondary | ICD-10-CM

## 2013-08-06 NOTE — Patient Instructions (Signed)
Stop the norvasc  Coumadin check this afternoon Glycerin suppository for now  Miralax start once a day - cap-full  Referral GI - get appt Referral to Foot doctor  [facility] for new shoes  Okay to use metamucil  F/U 3 months

## 2013-08-07 ENCOUNTER — Ambulatory Visit (INDEPENDENT_AMBULATORY_CARE_PROVIDER_SITE_OTHER): Payer: Medicare Other | Admitting: *Deleted

## 2013-08-07 ENCOUNTER — Encounter: Payer: Self-pay | Admitting: Family Medicine

## 2013-08-07 DIAGNOSIS — Z7901 Long term (current) use of anticoagulants: Secondary | ICD-10-CM

## 2013-08-07 DIAGNOSIS — I482 Chronic atrial fibrillation, unspecified: Secondary | ICD-10-CM

## 2013-08-07 DIAGNOSIS — I959 Hypotension, unspecified: Secondary | ICD-10-CM | POA: Insufficient documentation

## 2013-08-07 DIAGNOSIS — I4891 Unspecified atrial fibrillation: Secondary | ICD-10-CM

## 2013-08-07 LAB — POCT INR: INR: 2

## 2013-08-07 NOTE — Progress Notes (Signed)
  Subjective:    Patient ID: Kristin Logan, female    DOB: December 31, 1931, 79 y.o.   MRN: [id number]  HPI  Pt here to f/u chronic problems, now undercare of her SonRosanne Logan and his Wife Kristin Logan. There son Kristin Logan is also here. [facility] now following Hyonatremia- recent Sodium level normal. She only took salt tabs 2 days, had difficulty tolerating due to irritation and dryness of mouth OA knee- s/p bilateral knee injection, mild improvement in pain, takes pain meds as needed- has f/u Constipation- seen in ER this past weekend due to impaction, given glycerin suppository which helps. She liked Miralax but with previous N/V had not been able to take. Has not seen GI wants to re-establish DM- diet controlled, needs Diabetic Shoes  Fatigue- continues to feel fatigued, taking meds as prescribed, weight has been stable with CHF program and [facility]. No recent falls. Appetite is okay   Review of Systems  GEN- + fatigue, fever, weight loss,weakness, recent illness HEENT- denies eye drainage, change in vision, nasal discharge, CVS- denies chest pain, palpitations RESP- denies SOB, cough, wheeze ABD- denies N/V, change in stools, abd pain GU- denies dysuria, hematuria, dribbling, incontinence MSK- +joint pain, muscle aches, injury Neuro- denies headache, dizziness, syncope, seizure activity      Objective:   Physical Exam GEN- NAD, alert and oriented x 3 ,sitting in wheelchair,fatigued appearing HEENT-PEERL, EOMI , oropharynx clear, MMM CVS- irregular rhythem, normal rate  2/6 SEM RESP-, CTAB  ABD-NABS,soft,NT,ND Ext- no swelling Pulse- Radial 2+, DP 2+ Nails- thickened nails Psych- Normal affect and mood         Assessment & Plan:

## 2013-08-07 NOTE — Assessment & Plan Note (Signed)
TSH normal off meds

## 2013-08-07 NOTE — Assessment & Plan Note (Signed)
Currently stable, but needs GI follow-up Uses ginger Tea Refer to GI , family agrees to take

## 2013-08-07 NOTE — Assessment & Plan Note (Signed)
Diet controlled Podiatry Diabetic shoes

## 2013-08-07 NOTE — Assessment & Plan Note (Signed)
D/c norvasc Will have [facility] check bP

## 2013-08-07 NOTE — Assessment & Plan Note (Signed)
Hold on salt tabs will have [facility] check metabolic panel in 1 week

## 2013-08-07 NOTE — Assessment & Plan Note (Signed)
Recent disimpaction Continue glycerin Restart Miralax GI referral

## 2013-08-08 ENCOUNTER — Telehealth: Payer: Self-pay | Admitting: Family Medicine

## 2013-08-08 ENCOUNTER — Other Ambulatory Visit: Payer: Self-pay | Admitting: Cardiology

## 2013-08-08 NOTE — Telephone Encounter (Signed)
Message copied by Samuella Cota on Fri Aug 08, 2013 11:10 AM ------      Message from: [name] F      Created: Thu Aug 07, 2013 10:45 AM      Regarding: Please see below                   1.  Call [facility]- pt needs BP readings - can they provide with cuff, also send me readings in 1 week, norvasc stopped due to hypotension. I dont know who nurse is.             2. BMET needed Next Wed , hold off on Salt tablets- [facility] to draw this             3. She needs to take Miralax once a day                  Document in chart ------

## 2013-08-08 NOTE — Telephone Encounter (Signed)
Darl Pikes from [facility] called back and is aware of the orders. She will have everything done by Wednesday.

## 2013-08-13 ENCOUNTER — Ambulatory Visit (INDEPENDENT_AMBULATORY_CARE_PROVIDER_SITE_OTHER): Payer: Medicare Other | Admitting: *Deleted

## 2013-08-13 ENCOUNTER — Telehealth: Payer: Self-pay | Admitting: Family Medicine

## 2013-08-13 DIAGNOSIS — I482 Chronic atrial fibrillation, unspecified: Secondary | ICD-10-CM

## 2013-08-13 DIAGNOSIS — Z7901 Long term (current) use of anticoagulants: Secondary | ICD-10-CM

## 2013-08-13 DIAGNOSIS — I4891 Unspecified atrial fibrillation: Secondary | ICD-10-CM

## 2013-08-13 LAB — POCT INR: INR: 1.1

## 2013-08-13 NOTE — Telephone Encounter (Signed)
KD

## 2013-08-13 NOTE — Telephone Encounter (Signed)
She does not need to go to the hospital The coumadin clinic has already received the lab results, there instructions are below  Take coumadin 10mg  tonight then 7.5mg  daily until INR check on 11/17

## 2013-08-13 NOTE — Telephone Encounter (Signed)
Pt is aware to take coumadin 10mg  and then 7.5 mg after until next check on the 11/17

## 2013-08-13 NOTE — Telephone Encounter (Signed)
The home nurse came today and drew blood . She said that it was thick . Patient hasn't had her coumadin in two. INR was 1.1 . Son is waiting to find out if she needs to go to the hospital . Please call and advise.

## 2013-08-15 ENCOUNTER — Telehealth: Payer: Self-pay | Admitting: Family Medicine

## 2013-08-15 DIAGNOSIS — E871 Hypo-osmolality and hyponatremia: Secondary | ICD-10-CM

## 2013-08-15 NOTE — Telephone Encounter (Signed)
Please let pt and family know  Sodium level has dropped low again, she will need to restart the salt tablets- take 1 gram  tablet twice a day-- this is over the counter I had [facility] order the correct dose for her, they will be ready on Monday. For now take the salt tablet she has at home 1 twice a day  Home Health- [facility] needs to redraw BMET in 1 week She will be referred to endocrinology again as we can not keep her salt levels up

## 2013-08-18 ENCOUNTER — Ambulatory Visit (INDEPENDENT_AMBULATORY_CARE_PROVIDER_SITE_OTHER): Payer: Medicare Other | Admitting: *Deleted

## 2013-08-18 DIAGNOSIS — I482 Chronic atrial fibrillation, unspecified: Secondary | ICD-10-CM

## 2013-08-18 DIAGNOSIS — I4891 Unspecified atrial fibrillation: Secondary | ICD-10-CM

## 2013-08-18 DIAGNOSIS — Z7901 Long term (current) use of anticoagulants: Secondary | ICD-10-CM

## 2013-08-18 NOTE — Telephone Encounter (Signed)
Son is aware.

## 2013-08-18 NOTE — Telephone Encounter (Signed)
I called [facility] they are needing a order faxed over to them to have the redraw BMET. Fax number [phone]

## 2013-08-18 NOTE — Telephone Encounter (Signed)
Order written

## 2013-08-18 NOTE — Telephone Encounter (Signed)
Order faxed over to [facility]

## 2013-08-20 ENCOUNTER — Telehealth: Payer: Self-pay | Admitting: Family Medicine

## 2013-08-20 NOTE — Telephone Encounter (Signed)
Please call pt, sodium level is still low 123, is she taking the salt tabs 1 gram BID , they should have picked up from [place], see previous phone notes Also make sure they have the endocrinology appt- this is for her low salt levels  [facility] needs to redraw in 1 week

## 2013-08-21 ENCOUNTER — Encounter (HOSPITAL_COMMUNITY): Payer: Self-pay | Admitting: Emergency Medicine

## 2013-08-21 ENCOUNTER — Inpatient Hospital Stay (HOSPITAL_COMMUNITY)
Admission: EM | Admit: 2013-08-21 | Discharge: 2013-08-25 | DRG: 641 | Disposition: A | Discharge status: Home Health | Payer: Medicare Other | Attending: Internal Medicine | Admitting: Internal Medicine

## 2013-08-21 DIAGNOSIS — F3289 Other specified depressive episodes: Secondary | ICD-10-CM | POA: Diagnosis present

## 2013-08-21 DIAGNOSIS — Z6831 Body mass index (BMI) 31.0-31.9, adult: Secondary | ICD-10-CM

## 2013-08-21 DIAGNOSIS — E871 Hypo-osmolality and hyponatremia: Principal | ICD-10-CM | POA: Diagnosis present

## 2013-08-21 DIAGNOSIS — K219 Gastro-esophageal reflux disease without esophagitis: Secondary | ICD-10-CM | POA: Diagnosis present

## 2013-08-21 DIAGNOSIS — Z7901 Long term (current) use of anticoagulants: Secondary | ICD-10-CM

## 2013-08-21 DIAGNOSIS — I482 Chronic atrial fibrillation, unspecified: Secondary | ICD-10-CM

## 2013-08-21 DIAGNOSIS — F329 Major depressive disorder, single episode, unspecified: Secondary | ICD-10-CM | POA: Diagnosis present

## 2013-08-21 DIAGNOSIS — I498 Other specified cardiac arrhythmias: Secondary | ICD-10-CM | POA: Diagnosis present

## 2013-08-21 DIAGNOSIS — M5137 Other intervertebral disc degeneration, lumbosacral region: Secondary | ICD-10-CM | POA: Diagnosis present

## 2013-08-21 DIAGNOSIS — M51379 Other intervertebral disc degeneration, lumbosacral region without mention of lumbar back pain or lower extremity pain: Secondary | ICD-10-CM | POA: Diagnosis present

## 2013-08-21 DIAGNOSIS — E876 Hypokalemia: Secondary | ICD-10-CM | POA: Diagnosis present

## 2013-08-21 DIAGNOSIS — Z823 Family history of stroke: Secondary | ICD-10-CM

## 2013-08-21 DIAGNOSIS — E119 Type 2 diabetes mellitus without complications: Secondary | ICD-10-CM | POA: Diagnosis present

## 2013-08-21 DIAGNOSIS — Z79899 Other long term (current) drug therapy: Secondary | ICD-10-CM

## 2013-08-21 DIAGNOSIS — G4730 Sleep apnea, unspecified: Secondary | ICD-10-CM | POA: Diagnosis present

## 2013-08-21 DIAGNOSIS — M1710 Unilateral primary osteoarthritis, unspecified knee: Secondary | ICD-10-CM | POA: Diagnosis present

## 2013-08-21 DIAGNOSIS — E669 Obesity, unspecified: Secondary | ICD-10-CM | POA: Diagnosis present

## 2013-08-21 DIAGNOSIS — R001 Bradycardia, unspecified: Secondary | ICD-10-CM | POA: Diagnosis present

## 2013-08-21 DIAGNOSIS — Z952 Presence of prosthetic heart valve: Secondary | ICD-10-CM

## 2013-08-21 DIAGNOSIS — Z853 Personal history of malignant neoplasm of breast: Secondary | ICD-10-CM

## 2013-08-21 DIAGNOSIS — E2740 Unspecified adrenocortical insufficiency: Secondary | ICD-10-CM

## 2013-08-21 DIAGNOSIS — R112 Nausea with vomiting, unspecified: Secondary | ICD-10-CM

## 2013-08-21 DIAGNOSIS — I4891 Unspecified atrial fibrillation: Secondary | ICD-10-CM | POA: Diagnosis present

## 2013-08-21 DIAGNOSIS — E785 Hyperlipidemia, unspecified: Secondary | ICD-10-CM | POA: Diagnosis present

## 2013-08-21 DIAGNOSIS — Z993 Dependence on wheelchair: Secondary | ICD-10-CM

## 2013-08-21 DIAGNOSIS — R531 Weakness: Secondary | ICD-10-CM | POA: Diagnosis present

## 2013-08-21 DIAGNOSIS — Z8249 Family history of ischemic heart disease and other diseases of the circulatory system: Secondary | ICD-10-CM

## 2013-08-21 DIAGNOSIS — E039 Hypothyroidism, unspecified: Secondary | ICD-10-CM | POA: Diagnosis present

## 2013-08-21 DIAGNOSIS — I10 Essential (primary) hypertension: Secondary | ICD-10-CM | POA: Diagnosis present

## 2013-08-21 DIAGNOSIS — E2749 Other adrenocortical insufficiency: Secondary | ICD-10-CM | POA: Diagnosis present

## 2013-08-21 DIAGNOSIS — K3184 Gastroparesis: Secondary | ICD-10-CM

## 2013-08-21 HISTORY — DX: Gastroparesis: K31.84

## 2013-08-21 LAB — BASIC METABOLIC PANEL
BUN: 5 mg/dL — ABNORMAL LOW (ref 6–23)
CO2: 28 mEq/L (ref 19–32)
Creatinine, Ser: 0.53 mg/dL (ref 0.50–1.10)
GFR calc Af Amer: >90 mL/min (ref >90)
GFR calc Af Amer: >90 mL/min (ref >90)
GFR calc non Af Amer: 81 mL/min — ABNORMAL LOW (ref >90)
GFR calc non Af Amer: 88 mL/min — ABNORMAL LOW (ref >90)
Glucose, Bld: 109 mg/dL — ABNORMAL HIGH (ref 70–99)
Glucose, Bld: 101 mg/dL — ABNORMAL HIGH (ref 70–99)
Potassium: 2.8 mEq/L — ABNORMAL LOW (ref 3.5–5.1)
Potassium: 3.7 mEq/L (ref 3.5–5.1)
Sodium: 118 mEq/L — CL (ref 135–145)

## 2013-08-21 LAB — CBC WITH DIFFERENTIAL/PLATELET
Basophils Relative: 1 % (ref 0–1)
Eosinophils Absolute: 1.3 10*3/uL — ABNORMAL HIGH (ref 0.0–0.7)
Eosinophils Relative: 20 % — ABNORMAL HIGH (ref 0–5)
HCT: 34.9 % — ABNORMAL LOW (ref 36.0–46.0)
Hemoglobin: 12.8 g/dL (ref 12.0–15.0)
Lymphs Abs: 2 10*3/uL (ref 0.7–4.0)
MCH: 30 pg (ref 26.0–34.0)
MCHC: 36.7 g/dL — ABNORMAL HIGH (ref 30.0–36.0)
MCV: 81.7 fL (ref 78.0–100.0)
Monocytes Absolute: 0.4 10*3/uL (ref 0.1–1.0)
Monocytes Relative: 6 % (ref 3–12)
Neutro Abs: 2.7 10*3/uL (ref 1.7–7.7)
Neutrophils Relative %: 42 % — ABNORMAL LOW (ref 43–77)

## 2013-08-21 LAB — PROTIME-INR
INR: 1.52 — ABNORMAL HIGH (ref 0.00–1.49)
Prothrombin Time: 17.9 seconds — ABNORMAL HIGH (ref 11.6–15.2)

## 2013-08-21 LAB — GLUCOSE, CAPILLARY: Glucose-Capillary: 108 mg/dL — ABNORMAL HIGH (ref 70–99)

## 2013-08-21 LAB — URIC ACID: Uric Acid, Serum: 3.2 mg/dL (ref 2.4–7.0)

## 2013-08-21 LAB — MAGNESIUM: Magnesium: 2.1 mg/dL (ref 1.5–2.5)

## 2013-08-21 MED ORDER — POTASSIUM CHLORIDE 10 MEQ/100ML IV SOLN
10.0000 meq | INTRAVENOUS | Status: DC
  Administered 2013-08-21: 10 meq via INTRAVENOUS
  Filled 2013-08-21: qty 100

## 2013-08-21 MED ORDER — BISACODYL 10 MG RE SUPP: 10.0000 mg | Freq: Every day | RECTAL | Status: DC | PRN

## 2013-08-21 MED ORDER — PANTOPRAZOLE SODIUM 40 MG PO TBEC
40.0000 mg | DELAYED_RELEASE_TABLET | Freq: Every day | ORAL | Status: DC
  Administered 2013-08-22 – 2013-08-25 (×4): 40 mg via ORAL
  Filled 2013-08-21 (×4): qty 1

## 2013-08-21 MED ORDER — TRAZODONE HCL 50 MG PO TABS
25.0000 mg | ORAL_TABLET | Freq: Every evening | ORAL | Status: DC | PRN
  Administered 2013-08-24: 25 mg via ORAL
  Filled 2013-08-21: qty 1

## 2013-08-21 MED ORDER — ACETAMINOPHEN 325 MG PO TABS
650.0000 mg | ORAL_TABLET | Freq: Four times a day (QID) | ORAL | Status: DC | PRN
  Administered 2013-08-22 – 2013-08-25 (×5): 650 mg via ORAL
  Filled 2013-08-21 (×5): qty 2

## 2013-08-21 MED ORDER — ALBUTEROL SULFATE (5 MG/ML) 0.5% IN NEBU: 2.5000 mg | INHALATION_SOLUTION | Freq: Four times a day (QID) | RESPIRATORY_TRACT | Status: DC | PRN

## 2013-08-21 MED ORDER — CLOTRIMAZOLE 1 % EX CREA
TOPICAL_CREAM | CUTANEOUS | Status: AC
  Filled 2013-08-21: qty 15

## 2013-08-21 MED ORDER — ACETAMINOPHEN 650 MG RE SUPP: 650.0000 mg | Freq: Four times a day (QID) | RECTAL | Status: DC | PRN

## 2013-08-21 MED ORDER — SODIUM CHLORIDE 1 G PO TABS
1.0000 g | ORAL_TABLET | Freq: Two times a day (BID) | ORAL | Status: DC
  Filled 2013-08-21 (×2): qty 1

## 2013-08-21 MED ORDER — ONDANSETRON HCL 4 MG/2ML IJ SOLN: 4.0000 mg | Freq: Four times a day (QID) | INTRAMUSCULAR | Status: DC | PRN

## 2013-08-21 MED ORDER — ONDANSETRON HCL 4 MG PO TABS: 4.0000 mg | ORAL_TABLET | Freq: Four times a day (QID) | ORAL | Status: DC | PRN

## 2013-08-21 MED ORDER — INSULIN ASPART 100 UNIT/ML ~~LOC~~ SOLN
0.0000 [IU] | Freq: Three times a day (TID) | SUBCUTANEOUS | Status: DC
  Administered 2013-08-22: 2 [IU] via SUBCUTANEOUS
  Administered 2013-08-23: 3 [IU] via SUBCUTANEOUS
  Administered 2013-08-23: 1 [IU] via SUBCUTANEOUS
  Administered 2013-08-24: 3 [IU] via SUBCUTANEOUS
  Administered 2013-08-24: 2 [IU] via SUBCUTANEOUS

## 2013-08-21 MED ORDER — WARFARIN - PHARMACIST DOSING INPATIENT: Status: DC

## 2013-08-21 MED ORDER — SODIUM CHLORIDE 0.9 % IJ SOLN
3.0000 mL | Freq: Two times a day (BID) | INTRAMUSCULAR | Status: DC
  Administered 2013-08-21 – 2013-08-24 (×2): 3 mL via INTRAVENOUS

## 2013-08-21 MED ORDER — ENOXAPARIN SODIUM 40 MG/0.4ML ~~LOC~~ SOLN
40.0000 mg | SUBCUTANEOUS | Status: DC
  Administered 2013-08-21 – 2013-08-23 (×3): 40 mg via SUBCUTANEOUS
  Filled 2013-08-21 (×3): qty 0.4

## 2013-08-21 MED ORDER — CONIVAPTAN 20 MG/DEXTROSE 5 % 100ML IV SOLN: 0.8330 mg/h | Freq: Once | INTRAVENOUS | Status: DC

## 2013-08-21 MED ORDER — ALBUTEROL SULFATE HFA 108 (90 BASE) MCG/ACT IN AERS: 2.0000 | INHALATION_SPRAY | RESPIRATORY_TRACT | Status: DC | PRN

## 2013-08-21 MED ORDER — CONIVAPTAN 20 MG/DEXTROSE 5 % 100ML IV SOLN
0.4160 mg/h | Freq: Once | INTRAVENOUS | Status: AC
  Administered 2013-08-21: 0.416 mg/h via INTRAVENOUS
  Filled 2013-08-21: qty 100

## 2013-08-21 MED ORDER — CONIVAPTAN LOADING DOSE
20.0000 mg | Freq: Once | INTRAVENOUS | Status: AC
  Administered 2013-08-21: 20 mg via INTRAVENOUS
  Filled 2013-08-21: qty 100

## 2013-08-21 MED ORDER — CLOTRIMAZOLE 1 % EX CREA
TOPICAL_CREAM | Freq: Two times a day (BID) | CUTANEOUS | Status: DC
  Administered 2013-08-25: 09:00:00 via TOPICAL
  Filled 2013-08-21: qty 15

## 2013-08-21 MED ORDER — SODIUM CHLORIDE 0.9 % IV SOLN
INTRAVENOUS | Status: DC
  Administered 2013-08-21 – 2013-08-22 (×2): via INTRAVENOUS

## 2013-08-21 MED ORDER — ALUM & MAG HYDROXIDE-SIMETH 200-200-20 MG/5ML PO SUSP: 30.0000 mL | Freq: Four times a day (QID) | ORAL | Status: DC | PRN

## 2013-08-21 MED ORDER — POTASSIUM CHLORIDE CRYS ER 20 MEQ PO TBCR
40.0000 meq | EXTENDED_RELEASE_TABLET | Freq: Once | ORAL | Status: AC
  Administered 2013-08-21: 40 meq via ORAL
  Filled 2013-08-21: qty 2

## 2013-08-21 MED ORDER — WARFARIN SODIUM 10 MG PO TABS
10.0000 mg | ORAL_TABLET | Freq: Once | ORAL | Status: AC
  Administered 2013-08-21: 10 mg via ORAL
  Filled 2013-08-21: qty 1

## 2013-08-21 MED ORDER — SIMVASTATIN 20 MG PO TABS
20.0000 mg | ORAL_TABLET | Freq: Every day | ORAL | Status: DC
  Administered 2013-08-21 – 2013-08-24 (×4): 20 mg via ORAL
  Filled 2013-08-21 (×4): qty 1

## 2013-08-21 NOTE — ED Notes (Signed)
Report given to Molli Hazard, RN unit 300. Ready to receive patient.

## 2013-08-21 NOTE — H&P (Signed)
Patient seen and examined.  Agree with note as above.  Patient has been admitted with hyponatremia and generalized weakness.  Family states that she was taking sodium tablets at home.  Po intake has been poor at home.  This has been a recurring problem and she has had frequent admissions for the same. Patient is awake and alert, feels generally weak but does not appear to have any neurologic deficits.  Her primary care doctor was arranging for patient to see Dr. Fransico Him (endocrinologist) in the outpatient setting. Dr. Fransico Him was consulted on admission to assist with further work up of hyponatremia. After review of the patient's labs, her low sodium and generalized weakness, it is his recommendation that patient needs to be started on a conivaptan infusion. Patient will be transferred to stepdown unit for administration of medication.  Will check serial BMETs q4h.  Chara Marquard

## 2013-08-21 NOTE — ED Notes (Signed)
Attempted to call report. RN to call back. 

## 2013-08-21 NOTE — ED Provider Notes (Signed)
CSN: [id number]     Arrival date & time 08/21/13  1235 History  This chart was scribed for Kristin Hutching, MD by Quintella Reichert, ED scribe.  This patient was seen in room APA04/APA04 and the patient's care was started at 1:38 PM.   Chief Complaint  Patient presents with  . Low Na     The history is provided by a relative and the patient. No language interpreter was used.   Level V caveat for inability to verbalize history.   History obtained from daughter HPI Comments: DAMETRIA Logan is a 79 y.o. female with h/o A-fib, aortic valve disease, breast cancer, and hyponatremia who presents to the Emergency Department complaining of recurrent hyponatremia.  Pt had her blood drawn at her PCP's office 3 days ago and was called today and told that her sodium level is 123 and advised to come to the ED.  Presently pt complains of persistent, worsening generalized weakness and fatigue.  Daughter-in-low notes that pt has had ongoing issues with sodium level for most of this year and has been admitted at least 4 times for the same.  There are no known alleviating or aggravating factors per daughter-in-law.  Pt was on sodium tablets but was taken off recently when she was taken off of her BP medication.  She has been told that her kidneys are functioning well and the cause of her hyponatremia has not been determined.  She called an endocrinologist on 11/14 but has not yet heard back.  Pt lives with family.  PCP: Kristin Antis, MD   Past Medical History  Diagnosis Date  . Diabetes mellitus   . Hyperlipidemia   . Eosinophilia   . Cataract   . Hypertension   . Atrial fibrillation prior to 2008    moderate biatrial enlargement in 2009; mild to moderate LVH with a low-normal EF  . GERD (gastroesophageal reflux disease)     Hemorrhoids; history of peptic ulcer disease  . DJD (degenerative joint disease) of knee   . Sleep apnea     CPAP  . Chronic anticoagulation   . Aortic valve disease     2009:  mild stenosis and regurgitation; peak gradient of 30-35 mmHg , subsequent AVR at [facility]  . Incontinence     Mild  . Carcinoma of breast     Right mastectomy; radiation and hormonal therapy  . Hip fracture, left 12/12/2012  . Foot injury 06/25/2012  . Degenerative joint disease of knee 01/29/2008    Bilateral    . BREAST CANCER, HX OF 01/29/2008    Lumpectomy, radiation therapy  Arimidex stopped in July 2013    . Syncope and collapse 11/11/2012  . Diverticulosis   . Umbilical hernia   . Cholelithiasis   . DDD (degenerative disc disease), lumbar   . Vertebral compression fracture     lumbar  . Chronic pain   . Paresthesias     feet  . Gastroparesis     Past Surgical History  Procedure Laterality Date  . Exploration post operative open heart    . Colonoscopy      Approximately 2000  . Cardiac valve replacement      [facility]  . Cardiac valve replacement    . Breast surgery    . Breast lumpectomy    . Esophagogastroduodenoscopy N/A 02/20/2013    Procedure: ESOPHAGOGASTRODUODENOSCOPY (EGD);  Surgeon: Kristin Hippo, MD;  Location: [facility];  Service: Endoscopy;  Laterality: N/A;  . Mastectomy  Family History  Problem Relation Age of Onset  . Heart disease Mother   . Stroke Father   . Cancer Sister   . Heart disease Sister   . Stroke Sister   . Stroke Brother     History  Substance Use Topics  . Smoking status: Never Smoker   . Smokeless tobacco: Not on file  . Alcohol Use: No    OB History   Grav Para Term Preterm Abortions TAB SAB Ect Mult Living                  Review of Systems  Unable to perform ROS: Other   A complete 10 system review of systems was obtained and all systems are negative except as noted in the HPI and PMH.    Allergies  Nsaids; Codeine; and Adhesive  Home Medications   Current Outpatient Rx  Name  Route  Sig  Dispense  Refill  . acetaminophen (TYLENOL) 650 MG CR tablet   Oral   Take 1 tablet (650 mg total) by mouth every 8  (eight) hours as needed for pain.         Marland Kitchen albuterol (PROVENTIL HFA;VENTOLIN HFA) 108 (90 BASE) MCG/ACT inhaler   Inhalation   Inhale 2 puffs into the lungs every 4 (four) hours as needed for wheezing.   1 Inhaler   2   . albuterol (PROVENTIL) (2.5 MG/3ML) 0.083% nebulizer solution   Nebulization   Take 3 mLs (2.5 mg total) by nebulization every 6 (six) hours as needed for wheezing.   75 mL   3   . amLODipine (NORVASC) 5 MG tablet   Oral   Take 1 tablet (5 mg total) by mouth daily.   30 tablet   2   . diclofenac sodium (VOLTAREN) 1 % GEL   Topical   Apply 1 application topically daily as needed (for knee pain).         Marland Kitchen glycerin adult (GLYCERIN ADULT) 2 G SUPP   Rectal   Place 1 suppository rectally once.   20 each   2   . HYDROcodone-acetaminophen (NORCO/VICODIN) 5-325 MG per tablet   Oral   Take 1 tablet by mouth at bedtime as needed for pain.         . Multiple Vitamins-Minerals (CENTRUM SILVER ULTRA WOMENS PO)   Oral   Take 1 tablet by mouth daily.          Marland Kitchen omeprazole (PRILOSEC) 20 MG capsule   Oral   Take 1 capsule (20 mg total) by mouth 2 (two) times daily.   60 capsule   5   . simvastatin (ZOCOR) 20 MG tablet   Oral   Take 1 tablet (20 mg total) by mouth at bedtime.   30 tablet   6   . sodium chloride 1 G tablet   Oral   Take 1 tablet (1 g total) by mouth 2 (two) times daily.   60 tablet   0   . warfarin (COUMADIN) 5 MG tablet      TAKE 1 TABLET BY MOUTH DAILY AT 5PM AS DIRECTED.   60 tablet   2    BP 114/59  Pulse 60  Temp(Src) 97.3 F (36.3 C) (Oral)  Resp 20  Ht 5\' 5"  (1.651 m)  Wt 190 lb (86.183 kg)  BMI 31.62 kg/m2  SpO2 99%  Physical Exam  Nursing note and vitals reviewed. Constitutional: She is oriented to person, place, and time. She appears well-developed  and well-nourished.  HENT:  Head: Normocephalic and atraumatic.  Eyes: Conjunctivae and EOM are normal. Pupils are equal, round, and reactive to light.  Neck:  Normal range of motion. Neck supple.  Cardiovascular: Normal rate, regular rhythm and normal heart sounds.   Pulmonary/Chest: Effort normal and breath sounds normal.  Abdominal: Soft. Bowel sounds are normal.  nontender  Musculoskeletal: Normal range of motion.  Neurological: She is alert and oriented to person, place, and time.  Skin: Skin is warm and dry.  Pink color  Psychiatric: She has a normal mood and affect. Her behavior is normal.    ED Course  Procedures (including critical care time)  DIAGNOSTIC STUDIES: Oxygen Saturation is 99% on room air, normal by my interpretation.    COORDINATION OF CARE: 1:45 PM-Discussed treatment plan which includes EKG and labs with pt at bedside and pt agreed to plan.    Labs Review Labs Reviewed  BASIC METABOLIC PANEL - Abnormal; Notable for the following:    Sodium 118 (*)    Potassium 2.8 (*)    Chloride 81 (*)    Glucose, Bld 109 (*)    BUN 5 (*)    GFR calc non Af Amer 81 (*)    All other components within normal limits  CBC WITH DIFFERENTIAL - Abnormal; Notable for the following:    HCT 34.9 (*)    MCHC 36.7 (*)    Neutrophils Relative % 42 (*)    Eosinophils Relative 20 (*)    Eosinophils Absolute 1.3 (*)    All other components within normal limits  URINALYSIS, ROUTINE W REFLEX MICROSCOPIC    Imaging Review No results found.  EKG Interpretation    Date/Time:  Thursday August 21 2013 12:53:20 EST Ventricular Rate:  68 PR Interval:    QRS Duration: 92 QT Interval:  428 QTC Calculation: 455 R Axis:   68 Text Interpretation:  Atrial fibrillation with premature ventricular or aberrantly conducted complexes Septal infarct (cited on or before 06-May-2013) ST \\T \ T wave abnormality, consider inferolateral ischemia Abnormal ECG When compared with ECG of 24-Jul-2013 12:05, Previous ECG has undetermined rhythm, needs review Inverted T waves have replaced nonspecific T wave abnormality in Anterior leads Confirmed by Auriana Scalia  MD,  Xion Debruyne ([id number]) on 08/21/2013 1:35:34 PM            MDM  No diagnosis found. Patient has history of hyponatremia. Sodium today 118. Potassium 2.8      Rx for potassium replacement. Admit to general medicine    I personally performed the services described in this documentation, which was scribed in my presence. The recorded information has been reviewed and is accurate.    Kristin Hutching, MD 08/21/13 [phone]

## 2013-08-21 NOTE — ED Notes (Signed)
PICC RN called back. Will be over to see patient in a little while.

## 2013-08-21 NOTE — ED Notes (Signed)
Pt reports had lab work drawn Monday and was called today and told her Na was low.  Pt c/o weakness, nausea, and sleeping a lot.

## 2013-08-21 NOTE — Progress Notes (Signed)
ANTICOAGULATION CONSULT NOTE - Initial Consult  Pharmacy Consult for Coumadin Indication: atrial fibrillation /hx AVR  Allergies  Allergen Reactions  . Nsaids     Bleeding ulcer  . Codeine Nausea And Vomiting  . Adhesive [Tape] Rash    Redness, and peeling skin off.     Patient Measurements: Height: 5\' 5"  (165.1 cm) Weight: 190 lb (86.183 kg) IBW/kg (Calculated) : 57  Vital Signs: Temp: 97.3 F (36.3 C) (11/20 1238) Temp src: Oral (11/20 1238) BP: 157/80 mmHg (11/20 1626) Pulse Rate: 64 (11/20 1626)  Labs:  Recent Labs  08/21/13 1343  HGB 12.8  HCT 34.9*  PLT 232  CREATININE 0.67    Estimated Creatinine Clearance: 61.8 ml/min (by C-G formula based on Cr of 0.67).   Medical History: Past Medical History  Diagnosis Date  . Diabetes mellitus   . Hyperlipidemia   . Eosinophilia   . Cataract   . Hypertension   . Atrial fibrillation prior to 2008    moderate biatrial enlargement in 2009; mild to moderate LVH with a low-normal EF  . GERD (gastroesophageal reflux disease)     Hemorrhoids; history of peptic ulcer disease  . DJD (degenerative joint disease) of knee   . Sleep apnea     CPAP  . Chronic anticoagulation   . Aortic valve disease     2009: mild stenosis and regurgitation; peak gradient of 30-35 mmHg , subsequent [facility]R at [facility]  . Incontinence     Mild  . Carcinoma of breast     Right mastectomy; radiation and hormonal therapy  . Hip fracture, left 12/12/2012  . Foot injury 06/25/2012  . Degenerative joint disease of knee 01/29/2008    Bilateral    . BREAST CANCER, HX OF 01/29/2008    Lumpectomy, radiation therapy  Arimidex stopped in July 2013    . Syncope and collapse 11/11/2012  . Diverticulosis   . Umbilical hernia   . Cholelithiasis   . DDD (degenerative disc disease), lumbar   . Vertebral compression fracture     lumbar  . Chronic pain   . Paresthesias     feet  . Gastroparesis     Medications:  Scheduled:   Assessment: 79 yo F on  chronic warfarin for hx Afib and aortic valve replacement.   Per outpatient records, she had been on warfarin 7.5mg  daily except 5mg  on MWF with therapeutic INR until 11/12 when her INR was at baseline.  Dose was increased to 7.5mg  daily without any change in INR so dose was further increased to 10mg  daily on 11/17.  INR has increased to 1.52 today. No bleeding noted.   Goal of Therapy:  INR 2-3 (per outpatient treatment plan goal)   Plan:  Coumadin 10mg  po x1 again today Daily INR  Elysse Polidore, [facility] 08/21/2013,4:31 PM

## 2013-08-21 NOTE — Telephone Encounter (Signed)
Called Dr. Fransico Him office to see if pt was set up for appt, she has not. Dr. Fransico Him office is going to call the son and dauighter in laws number that was just given adn set there appt up as soon as possible.

## 2013-08-21 NOTE — Progress Notes (Signed)
Na reported at 118. Same as previous value. Vaprisol drip and NS IVF going at this time.

## 2013-08-21 NOTE — Progress Notes (Signed)
Report given to [facility] in ICU.  Patient transferred.  Kristin Logan 08/21/2013

## 2013-08-21 NOTE — H&P (Signed)
[name] History and Physical  Kristin Logan AVW:[id number] DOB: 1932-06-17 DOA: 08/21/2013  Referring physician:  PCP: [name], MD  Specialists:   Chief Complaint: Abnormal lab, generalized weakness  HPI: Kristin Logan is a 79 y.o. female with a past medical history that includes A. fib, aortic valve disease status post aVR with bioprosthetic device on chronic anticoagulation, hypothyroidism, depression and persistent hyponatremia presents to the emergency department today with the chief complaint of abnormal labs. Information is obtained from the daughter who is the primary caregiver and resides with the patient. Patient was discharged 3 weeks ago. Daughter states that she has been doing "okay since that time. She went to PCPs office 4 days ago for routine blood work and was called today and told that the sodium level had trended down to 123. It was recommended that she come to the emergency department. Patient complains of generalized weakness worsening over the last 3 days. She denies any chest pain palpitation shortness of breath headache visual disturbances. She denies any abdominal pain dysuria hematuria frequency or urgency. She does report that she feels nauseated every morning and has had 2 episodes of emesis since her discharge 3 weeks ago. She denies any coffee ground emesis. Also indicates that her appetite waxes and wanes but is unsure if she's had any unintentional weight loss. He denies fever or chills diarrhea or constipation or melena. Workup in the emergency room notable for sodium level of 118, potassium 2.8, chloride 81 serum glucose 109. She is afebrile and hemodynamically stable in the emergency department. We're asked to admit for further evaluation and treatment.   Review of Systems: 10 point review of systems completed and all systems are negative except as indicated in the history of present illness  Past Medical History  Diagnosis Date  . Diabetes  mellitus   . Hyperlipidemia   . Eosinophilia   . Cataract   . Hypertension   . Atrial fibrillation prior to 2008    moderate biatrial enlargement in 2009; mild to moderate LVH with a low-normal EF  . GERD (gastroesophageal reflux disease)     Hemorrhoids; history of peptic ulcer disease  . DJD (degenerative joint disease) of knee   . Sleep apnea     CPAP  . Chronic anticoagulation   . Aortic valve disease     2009: mild stenosis and regurgitation; peak gradient of 30-35 mmHg , subsequent AVR at [facility]  . Incontinence     Mild  . Carcinoma of breast     Right mastectomy; radiation and hormonal therapy  . Hip fracture, left 12/12/2012  . Foot injury 06/25/2012  . Degenerative joint disease of knee 01/29/2008    Bilateral    . BREAST CANCER, HX OF 01/29/2008    Lumpectomy, radiation therapy  Arimidex stopped in July 2013    . Syncope and collapse 11/11/2012  . Diverticulosis   . Umbilical hernia   . Cholelithiasis   . DDD (degenerative disc disease), lumbar   . Vertebral compression fracture     lumbar  . Chronic pain   . Paresthesias     feet  . Gastroparesis    Past Surgical History  Procedure Laterality Date  . Exploration post operative open heart    . Colonoscopy      Approximately 2000  . Cardiac valve replacement      [facility]  . Cardiac valve replacement    . Breast surgery    . Breast lumpectomy    .  Esophagogastroduodenoscopy N/A 02/20/2013    Procedure: ESOPHAGOGASTRODUODENOSCOPY (EGD);  Surgeon: Malissa Hippo, MD;  Location[facility];  Service: Endoscopy;  Laterality: N/A;  . Mastectomy     Social History:  reports that she has never smoked. She does not have any smokeless tobacco history on file. She reports that she does not drink alcohol or use illicit drugs. Patient lives with her son and his wife. She is mostly wheelchair-bound but on a good day she ambulates with a walker short distances. He is alert and oriented and can make her needs known  Allergies   Allergen Reactions  . Nsaids     Bleeding ulcer  . Codeine Nausea And Vomiting  . Adhesive [Tape] Rash    Redness, and peeling skin off.     Family History  Problem Relation Age of Onset  . Heart disease Mother   . Stroke Father   . Cancer Sister   . Heart disease Sister   . Stroke Sister   . Stroke Brother     Prior to Admission medications   Medication Sig Start Date End Date Taking? Authorizing Provider  clotrimazole-betamethasone (LOTRISONE) cream Apply 1 application topically 2 (two) times daily.   Yes Historical Provider, MD  diclofenac sodium (VOLTAREN) 1 % GEL Apply 1 application topically daily as needed (for knee pain).   Yes Historical Provider, MD  glycerin adult (GLYCERIN ADULT) 2 G SUPP Place 1 suppository rectally once. 08/04/13  Yes [name], MD  HYDROcodone-acetaminophen (NORCO/VICODIN) 5-325 MG per tablet Take 1 tablet by mouth at bedtime as needed for pain.   Yes Historical Provider, MD  Multiple Vitamins-Minerals (CENTRUM SILVER ULTRA WOMENS PO) Take 1 tablet by mouth daily.    Yes Historical Provider, MD  omeprazole (PRILOSEC) 20 MG capsule Take 1 capsule (20 mg total) by mouth 2 (two) times daily. 07/23/13  Yes Salley Scarlet, MD  simvastatin (ZOCOR) 20 MG tablet Take 1 tablet (20 mg total) by mouth at bedtime. 01/28/13  Yes Salley Scarlet, MD  sodium chloride 1 G tablet Take 1 tablet (1 g total) by mouth 2 (two) times daily. 07/27/13  Yes Erick Blinks, MD  warfarin (COUMADIN) 5 MG tablet Take 5 mg by mouth daily at 6 PM. Has taken 10 mg (2 tablets) for the past 3 nights, Home Health Care was supposed to come out today AND CHECK PATIENTS  INR .  Normally, she takes 1 tablet (5mg ) on Mondays, Wednesdays and  Fridays. She normally takes 1 1/2 tablets (7.5mg ) on Tuesdays, Thursdays, Saturdays and Sundays   Yes Historical Provider, MD  warfarin (COUMADIN) 5 MG tablet take as directed 08/08/13  Yes Jodelle Gross, NP  acetaminophen (TYLENOL) 650 MG  CR tablet Take 1 tablet (650 mg total) by mouth every 8 (eight) hours as needed for pain. 01/21/13   Standley Brooking, MD  albuterol (PROVENTIL HFA;VENTOLIN HFA) 108 (90 BASE) MCG/ACT inhaler Inhale 2 puffs into the lungs every 4 (four) hours as needed for wheezing. 10/15/12 10/15/13  Salley Scarlet, MD  albuterol (PROVENTIL) (2.5 MG/3ML) 0.083% nebulizer solution Take 3 mLs (2.5 mg total) by nebulization every 6 (six) hours as needed for wheezing. 03/31/13   Salley Scarlet, MD   Physical Exam: Filed Vitals:   08/21/13 1238  BP: 114/59  Pulse: 60  Temp: 97.3 F (36.3 C)  Resp: 20     General:  Obese no acute distress  Eyes: PERRLA, EOMI, no scleral icterus  ENT: Ears  are clear nose without drainage oropharynx without erythema or exudate. Mucous membranes of her mouth are pink and moist  Neck: Supple no JVD full range of motion no lymphadenopathy  Cardiovascular: Irregularly irregular, I hear no murmur no gallop no rub there is no lower extremity edema pedal pulses are present and palpable  Respiratory: Normal effort. Breath sounds are somewhat distant but clear bilaterally there are no wheezes no crackles  Abdomen: Obese soft positive bowel sounds but sluggish nontender to palpation no mass organomegaly noted  Skin: Slightly pale but warm I see no rash or no lesions  Musculoskeletal: Joints without swelling/erythema nontender to palpation  Psychiatric: Calm cooperative  Neurologic: Oriented x3 somewhat hard of hearing cranial nerves II through XII grossly  Labs on Admission:  Basic Metabolic Panel:  Recent Labs Lab 08/21/13 1343  NA 118*  K 2.8*  CL 81*  CO2 28  GLUCOSE 109*  BUN 5*  CREATININE 0.67  CALCIUM 9.0   Liver Function Tests: No results found for this basename: AST, ALT, ALKPHOS, BILITOT, PROT, ALBUMIN,  in the last 168 hours No results found for this basename: LIPASE, AMYLASE,  in the last 168 hours No results found for this basename: AMMONIA,  in  the last 168 hours CBC:  Recent Labs Lab 08/21/13 1343  WBC 6.3  NEUTROABS 2.7  HGB 12.8  HCT 34.9*  MCV 81.7  PLT 232   Cardiac Enzymes: No results found for this basename: CKTOTAL, CKMB, CKMBINDEX, TROPONINI,  in the last 168 hours  BNP (last 3 results)  Recent Labs  11/11/12 0108 01/14/13 2215 07/24/13 1322  PROBNP 354.6 386.4 439.5   CBG: No results found for this basename: GLUCAP,  in the last 168 hours  Radiological Exams on Admission: No results found.  EKG: Independently reviewed. Atrial fibrillation with premature ventricular or aberrantly conducted complexes Septal infarct (cited on or before 06-May-2013) ST & T wave abnormality, consider inferolateral ischemia. Abnormal ECG When compared with ECG of 24-Jul-2013 12:05, Previous ECG has undetermined rhythm, needs review Inverted T waves have replaced nonspecific T wave abnormality in Anterior leads Confirmed by COOK MD, BRIAN ([id number])  Assessment/Plan Principal Problem:   Hyponatremia: Recurrent. He oligemia uncertain. Daughter did indicate that at discharge sodium chloride tablets ordered but she was only able to find sodium chloride tablets with added potassium. She indicates that this medication was ordered twice daily and she did given the patient 3 times daily. There is no emesis diarrhea. Patient does report less by mouth fluids but denies any decrease in urine output. Will admit will gently hydrate as patient does not seem particularly dry. Will obtain urine sodium and osmolality. Will obtain uric acid level and serum osmolality. Will request an endocrinology consult. Active Problems:  Hypokalemia: In the emergency department her potassium level was 2.8. She was given 40 mEq of potassium by mouth. I will request 3 runs of potassium. Will also repeat 40 mEq by mouth at 10 PM tonight. Will recheck in the a.m. monitor on telemetry. we'll check magnesium    DIABETES MELLITUS, TYPE II: Will resume home regimen. Will  use sliding scale insulin for optimal glycemic control. Car modified diet    HYPERTENSION: In the emergency room systolic blood pressure range 40-981. Home medications include no antihypertensive. Will monitor    Atrial fibrillation, chronic: Currently. Rate controlled heart rate in the 60s. INR level pending. Patient is on Coumadin. Will ask pharmacy to dose     GERD: Appears stable at baseline. Continue  home medication    Obesity: BMI 31.7. Car modified diet. Last admission patient had nutritional consult    Hypothyroidism: Recent admission TSH within the limits of normal. Will continue home Synthroid    Weakness: I clearly related to #1 and #2. Will request physical therapy consult once acute phase of illness is over    Bradycardia: Etiology unclear. History of same. Patient no longer on beta blocker. Heart rate low 60s. Will monitor  HTN: Chart review indicates that recent discharge patient discharged on Norvasc. Blood pressure 157/80 in the emergency room. Will resume    Endocrinology  Code Status: full  Family Communication: daughter in law at bedside Disposition Plan: home when ready  Time spent: 55 minutes  [name]   If 7PM-7AM, please contact night-coverage [email or web address] Password [facility] 08/21/2013, 4:19 PM

## 2013-08-22 ENCOUNTER — Encounter (HOSPITAL_COMMUNITY): Payer: Medicare Other

## 2013-08-22 ENCOUNTER — Inpatient Hospital Stay (HOSPITAL_COMMUNITY): Payer: Medicare Other

## 2013-08-22 DIAGNOSIS — R112 Nausea with vomiting, unspecified: Secondary | ICD-10-CM

## 2013-08-22 LAB — COMPREHENSIVE METABOLIC PANEL
ALT: 10 U/L (ref 0–35)
AST: 26 U/L (ref 0–37)
BUN: 5 mg/dL — ABNORMAL LOW (ref 6–23)
CO2: 26 mEq/L (ref 19–32)
Calcium: 9.6 mg/dL (ref 8.4–10.5)
Chloride: 89 mEq/L — ABNORMAL LOW (ref 96–112)
Creatinine, Ser: 0.61 mg/dL (ref 0.50–1.10)
GFR calc Af Amer: >90 mL/min (ref >90)
GFR calc non Af Amer: 84 mL/min — ABNORMAL LOW (ref >90)
Glucose, Bld: 88 mg/dL (ref 70–99)
Sodium: 125 mEq/L — ABNORMAL LOW (ref 135–145)
Total Bilirubin: 0.4 mg/dL (ref 0.3–1.2)
Total Protein: 6.8 g/dL (ref 6.0–8.3)

## 2013-08-22 LAB — CBC
Hemoglobin: 12.8 g/dL (ref 12.0–15.0)
MCH: 29.6 pg (ref 26.0–34.0)
Platelets: 244 10*3/uL (ref 150–400)
RBC: 4.32 MIL/uL (ref 3.87–5.11)
WBC: 8.5 10*3/uL (ref 4.0–10.5)

## 2013-08-22 LAB — T3, FREE: T3, Free: 1.1 pg/mL — ABNORMAL LOW (ref 2.3–4.2)

## 2013-08-22 LAB — URINALYSIS, ROUTINE W REFLEX MICROSCOPIC
Bilirubin Urine: NEGATIVE
Glucose, UA: NEGATIVE mg/dL
Hgb urine dipstick: NEGATIVE
Ketones, ur: NEGATIVE mg/dL
Protein, ur: NEGATIVE mg/dL
Specific Gravity, Urine: <1.005 — ABNORMAL LOW (ref 1.005–1.030)
Urobilinogen, UA: 0.2 mg/dL (ref 0.0–1.0)

## 2013-08-22 LAB — GLUCOSE, CAPILLARY
Glucose-Capillary: 87 mg/dL (ref 70–99)
Glucose-Capillary: 99 mg/dL (ref 70–99)
Glucose-Capillary: 121 mg/dL — ABNORMAL HIGH (ref 70–99)

## 2013-08-22 LAB — PROTIME-INR
INR: 1.57 — ABNORMAL HIGH (ref 0.00–1.49)
Prothrombin Time: 18.3 seconds — ABNORMAL HIGH (ref 11.6–15.2)

## 2013-08-22 LAB — SODIUM
Sodium: 125 mEq/L — ABNORMAL LOW (ref 135–145)
Sodium: 125 mEq/L — ABNORMAL LOW (ref 135–145)

## 2013-08-22 LAB — MRSA PCR SCREENING: MRSA by PCR: NEGATIVE

## 2013-08-22 LAB — SODIUM, URINE, RANDOM: Sodium, Ur: <10 mEq/L

## 2013-08-22 LAB — OSMOLALITY: Osmolality: 243 mOsm/kg — ABNORMAL LOW (ref 275–300)

## 2013-08-22 LAB — T4, FREE: Free T4: 0.35 ng/dL — ABNORMAL LOW (ref 0.80–1.80)

## 2013-08-22 LAB — OSMOLALITY, URINE: Osmolality, Ur: 119 mOsm/kg — ABNORMAL LOW (ref 390–1090)

## 2013-08-22 LAB — TSH: TSH: 3.091 u[IU]/mL (ref 0.350–4.500)

## 2013-08-22 MED ORDER — SODIUM CHLORIDE 0.9 % IJ SOLN
10.0000 mL | INTRAMUSCULAR | Status: DC | PRN
  Administered 2013-08-24: 10 mL

## 2013-08-22 MED ORDER — COSYNTROPIN 0.25 MG IJ SOLR
0.2500 mg | Freq: Once | INTRAMUSCULAR | Status: AC
  Administered 2013-08-22: 0.25 mg via INTRAVENOUS
  Filled 2013-08-22: qty 0.25

## 2013-08-22 MED ORDER — WARFARIN SODIUM 10 MG PO TABS
10.0000 mg | ORAL_TABLET | Freq: Once | ORAL | Status: AC
  Administered 2013-08-22: 10 mg via ORAL
  Filled 2013-08-22: qty 1

## 2013-08-22 MED ORDER — SODIUM CHLORIDE 0.9 % IJ SOLN
10.0000 mL | Freq: Two times a day (BID) | INTRAMUSCULAR | Status: DC
  Administered 2013-08-22 – 2013-08-24 (×4): 10 mL

## 2013-08-22 MED ORDER — DEXAMETHASONE 4 MG PO TABS
2.0000 mg | ORAL_TABLET | Freq: Every day | ORAL | Status: DC
  Administered 2013-08-22: 2 mg via ORAL
  Filled 2013-08-22: qty 1

## 2013-08-22 MED ORDER — LEVOTHYROXINE SODIUM 75 MCG PO TABS: 75.0000 ug | ORAL_TABLET | Freq: Every day | ORAL | Status: DC

## 2013-08-22 MED ORDER — CONIVAPTAN 20 MG/DEXTROSE 5 % 100ML IV SOLN
0.4160 mg/h | Freq: Once | INTRAVENOUS | Status: AC
  Administered 2013-08-22: 0.416 mg/h via INTRAVENOUS
  Filled 2013-08-22: qty 100

## 2013-08-22 MED ORDER — LEVOTHYROXINE SODIUM 75 MCG PO TABS
75.0000 ug | ORAL_TABLET | Freq: Every day | ORAL | Status: DC
  Administered 2013-08-22 – 2013-08-25 (×4): 75 ug via ORAL
  Filled 2013-08-22 (×4): qty 1

## 2013-08-22 NOTE — Care Management Note (Addendum)
    Page 1 of 1   08/25/2013     1:44:08 PM   CARE MANAGEMENT NOTE 08/25/2013  Patient:  JASIAH, BUNTIN   Account Number:  [id number]  Date Initiated:  08/22/2013  Documentation initiated by:  Sharrie Rothman  Subjective/Objective Assessment:   Pt admitted from home with hyponatremia. Pt lives with her son and daughter in law. Pt is active with [facility] RN and is on the list for CAP aide. Pt has shower chair, walker, and hoverround for home use.     Action/Plan:   CM will arrange resumption of HH at discharge and PT will need to be arranged at discharge. No DME needs noted.   Anticipated DC Date:  08/25/2013   Anticipated DC Plan:  HOME W HOME HEALTH SERVICES      DC Planning Services  CM consult      [facility] Choice  Resumption Of Svcs/PTA Provider   Choice offered to / List presented to:  C-1 Patient        HH arranged  HH-1 RN  HH-2 PT      [facility] agency  [facility]   Status of service:  Completed, signed off Medicare Important Message given?   (If response is "NO", the following Medicare IM given date fields will be blank) Date Medicare IM given:   Date Additional Medicare IM given:    Discharge Disposition:  HOME W HOME HEALTH SERVICES  Per UR Regulation:    If discussed at Long Length of Stay Meetings, dates discussed:    Comments:  08/22/13 1340 Arlyss Queen, RN BSN CM

## 2013-08-22 NOTE — Progress Notes (Signed)
ANTICOAGULATION CONSULT NOTE - Initial Consult  Pharmacy Consult for Coumadin Indication: atrial fibrillation /hx AVR  Allergies  Allergen Reactions  . Nsaids     Bleeding ulcer  . Codeine Nausea And Vomiting  . Adhesive [Tape] Rash    Redness, and peeling skin off.    Patient Measurements: Height: 5\' 5"  (165.1 cm) Weight: 196 lb 6.9 oz (89.1 kg) IBW/kg (Calculated) : 57  Vital Signs: Temp: 98 F (36.7 C) (11/21 0400) Temp src: Oral (11/21 0400) BP: 147/89 mmHg (11/21 0600) Pulse Rate: 72 (11/21 0600)  Labs:  Recent Labs  08/21/13 1343 08/21/13 2200 08/22/13 0458  HGB 12.8  --  12.8  HCT 34.9*  --  35.8*  PLT 232  --  244  LABPROT 17.9*  --  18.3*  INR 1.52*  --  1.57*  CREATININE 0.67 0.53 0.61   Estimated Creatinine Clearance: 62.8 ml/min (by C-G formula based on Cr of 0.61).  Medical History: Past Medical History  Diagnosis Date  . Diabetes mellitus   . Hyperlipidemia   . Eosinophilia   . Cataract   . Hypertension   . Atrial fibrillation prior to 2008    moderate biatrial enlargement in 2009; mild to moderate LVH with a low-normal EF  . GERD (gastroesophageal reflux disease)     Hemorrhoids; history of peptic ulcer disease  . DJD (degenerative joint disease) of knee   . Chronic anticoagulation   . Aortic valve disease     2009: mild stenosis and regurgitation; peak gradient of 30-35 mmHg , subsequent AVR at [facility]  . Incontinence     Mild  . Hip fracture, left 12/12/2012  . Foot injury 06/25/2012  . Degenerative joint disease of knee 01/29/2008    Bilateral    . BREAST CANCER, HX OF 01/29/2008    Lumpectomy, radiation therapy  Arimidex stopped in July 2013    . Syncope and collapse 11/11/2012  . Diverticulosis   . Umbilical hernia   . Cholelithiasis   . DDD (degenerative disc disease), lumbar   . Vertebral compression fracture     lumbar  . Chronic pain   . Paresthesias     feet  . Gastroparesis   . Sleep apnea     CPAP  . Carcinoma of breast      Right mastectomy; radiation and hormonal therapy   Medications:  Scheduled:   Assessment: 79 yo F on chronic warfarin for hx Afib and aortic valve replacement.   Per outpatient records, she had been on warfarin 7.5mg  daily except 5mg  on MWF with therapeutic INR until 11/12 when her INR was at baseline.  Dose was increased to 7.5mg  daily without any change in INR so dose was further increased to 10mg  daily on 11/17.  INR has increased to 1.52 on 11/20.  INR remains below target today. No bleeding noted.   Goal of Therapy:  INR 2-3 (per outpatient treatment plan goal)   Plan:  Coumadin 10mg  po x1 again today Daily INR  Valrie Hart A 08/22/2013,8:00 AM

## 2013-08-22 NOTE — Progress Notes (Signed)
CRITICAL VALUE ALERT  Critical value received:  Serum osmol 243  Date of notification:  08/22/2013  Time of notification:  0826  Critical value read back:yes  Nurse who received alert:  Alto Denver, RN  MD notified (1st page):  Nida  Time of first page:  [phone]  MD notified (2nd page):  Time of second page:  Responding MD:  nida  Time MD responded:  [phone]

## 2013-08-22 NOTE — Consult Note (Signed)
Kristin Logan MRN: [id number] DOB/AGE: 02/17/1932 79 y.o. Primary Care Physician:[facility], Kingsley Spittle, MD Admit date: 08/21/2013 Chief Complaint: Consult for Severe  Hyponatremia HPI: Kristin Logan is a 79 y.o. female with a past medical history that includes A. fib, aortic valve disease status post aVR with bioprosthetic device on chronic anticoagulation, hypothyroidism, depression and persistent hyponatremia presents to the emergency department today with the chief complaint of abnormal labs.  She is being seen in consultation for hyponatremia. She is a poor historian in ICU care. Her history is obtained by chart review and a brief encounter with her son and daughter -in-law at the bed side. Per documentation, she was discharged on July 25, 2013 after being admitted for generalized weakness when she also was found to have hyponatremia. During that admision her sodium level has responded to fluid restriction and she was discharged with NaCl tablets. She has dealt with progressive onset of poor appetite ,generalized weakness, and weight loss until it stabilized recently. She did have nausea, vomiting, and poor oral intake. Her symptoms have been worsening over the last 3 days.  She denies any chest pain palpitation shortness of breath headache visual disturbances. She denies any abdominal pain dysuria hematuria frequency or urgency.  He denied fever or chills diarrhea or constipation or melena.  In May her ACTH stimulation was suggestive of adrenal insufficiency , but was not on steroid support. She went to see her PMD , [name] , 4 days ago and has had CMP measured. Na level was found to be 123, she was called and advised to come to ER for evaluation.   Workup in the emergency room showed  sodium level of 118, potassium 2.8, chloride 81 serum glucose 109. She is afebrile and hemodynamically stable, but was admitted. Endocrinology consult was requested.  After reviewing her records , I  suggested to acutely treat her with Conivaptan to raise her na level  To safe levels ( 125-135) . I have put her on Dexamethasone 2 mg po qday, while repeating her ACTH stimulation test. This AM her Na was better at 125, and patient reportedly is much better clinically.       Past Medical History  Diagnosis Date  . Diabetes mellitus   . Hyperlipidemia   . Eosinophilia   . Cataract   . Hypertension   . Atrial fibrillation prior to 2008    moderate biatrial enlargement in 2009; mild to moderate LVH with a low-normal EF  . GERD (gastroesophageal reflux disease)     Hemorrhoids; history of peptic ulcer disease  . DJD (degenerative joint disease) of knee   . Chronic anticoagulation   . Aortic valve disease     2009: mild stenosis and regurgitation; peak gradient of 30-35 mmHg , subsequent AVR at [facility]  . Incontinence     Mild  . Hip fracture, left 12/12/2012  . Foot injury 06/25/2012  . Degenerative joint disease of knee 01/29/2008    Bilateral    . BREAST CANCER, HX OF 01/29/2008    Lumpectomy, radiation therapy  Arimidex stopped in July 2013    . Syncope and collapse 11/11/2012  . Diverticulosis   . Umbilical hernia   . Cholelithiasis   . DDD (degenerative disc disease), lumbar   . Vertebral compression fracture     lumbar  . Chronic pain   . Paresthesias     feet  . Gastroparesis   . Sleep apnea     CPAP  . Carcinoma of breast  Right mastectomy; radiation and hormonal therapy     Family History  Problem Relation Age of Onset  . Heart disease Mother   . Stroke Father   . Cancer Sister   . Heart disease Sister   . Stroke Sister   . Stroke Brother    Social History:  reports that she has never smoked. She does not have any smokeless tobacco history on file. She reports that she does not drink alcohol or use illicit drugs.  Allergies:  Allergies  Allergen Reactions  . Nsaids     Bleeding ulcer  . Codeine Nausea And Vomiting  . Adhesive [Tape] Rash    Redness,  and peeling skin off.     Medications Prior to Admission  Medication Sig Dispense Refill  . clotrimazole-betamethasone (LOTRISONE) cream Apply 1 application topically 2 (two) times daily.      . diclofenac sodium (VOLTAREN) 1 % GEL Apply 1 application topically daily as needed (for knee pain).      Marland Kitchen glycerin adult (GLYCERIN ADULT) 2 G SUPP Place 1 suppository rectally once.  20 each  2  . HYDROcodone-acetaminophen (NORCO/VICODIN) 5-325 MG per tablet Take 1 tablet by mouth at bedtime as needed for pain.      . Multiple Vitamins-Minerals (CENTRUM SILVER ULTRA WOMENS PO) Take 1 tablet by mouth daily.       Marland Kitchen omeprazole (PRILOSEC) 20 MG capsule Take 1 capsule (20 mg total) by mouth 2 (two) times daily.  60 capsule  5  . simvastatin (ZOCOR) 20 MG tablet Take 1 tablet (20 mg total) by mouth at bedtime.  30 tablet  6  . sodium chloride 1 G tablet Take 1 tablet (1 g total) by mouth 2 (two) times daily.  60 tablet  0  . warfarin (COUMADIN) 5 MG tablet Take 5 mg by mouth daily at 6 PM. Has taken 10 mg (2 tablets) for the past 3 nights, Home Health Care was supposed to come out today AND CHECK PATIENTS  INR .  Normally, she takes 1 tablet (5mg ) on Mondays, Wednesdays and  Fridays. She normally takes 1 1/2 tablets (7.5mg ) on Tuesdays, Thursdays, Saturdays and Sundays      . warfarin (COUMADIN) 5 MG tablet take as directed      . acetaminophen (TYLENOL) 650 MG CR tablet Take 1 tablet (650 mg total) by mouth every 8 (eight) hours as needed for pain.      Marland Kitchen albuterol (PROVENTIL HFA;VENTOLIN HFA) 108 (90 BASE) MCG/ACT inhaler Inhale 2 puffs into the lungs every 4 (four) hours as needed for wheezing.  1 Inhaler  2  . albuterol (PROVENTIL) (2.5 MG/3ML) 0.083% nebulizer solution Take 3 mLs (2.5 mg total) by nebulization every 6 (six) hours as needed for wheezing.  75 mL  3       WUJ:WJXBJ from the symptoms mentioned above,there are no other symptoms referable to all systems reviewed.  Physical Exam: Blood  pressure 147/89, pulse 72, temperature 98 F (36.7 C), temperature source Oral, resp. rate 15, height 5\' 5"  (1.651 m), weight 89.1 kg (196 lb 6.9 oz), SpO2 94.00%. GEN: no acute distress, resting in ICU bed, family at bed side. She is oriented to TPP HEENT: slightly dry mucus membranes, no goiter, no JVD Chest: CLTAB CVS: distant heart sounds, no murmur, no gallop. Abd: soft, non tender, positive bowel sounds. Ext: no edema Skin: poor turgor CNS: moves all extremities, non focal sensory and motor exam.    Recent Labs  08/21/13 1343 08/22/13  0458  WBC 6.3 8.5  NEUTROABS 2.7  --   HGB 12.8 12.8  HCT 34.9* 35.8*  MCV 81.7 82.9  PLT 232 244    Recent Labs  08/21/13 1343 08/21/13 2200 08/22/13 0050 08/22/13 0458  NA 118* 118* 118* 125*  K 2.8* 3.7  --  4.3  CL 81* 84*  --  89*  CO2 28 25  --  26  GLUCOSE 109* 101*  --  88  BUN 5* 5*  --  5*  CREATININE 0.67 0.53  --  0.61  CALCIUM 9.0 9.1  --  9.6  MG 2.1  --   --   --   PHOS 3.5  --   --   --     Recent Labs  08/22/13 0458  AST 26  ALT 10  ALKPHOS 65  BILITOT 0.4  PROT 6.8  ALBUMIN 3.5   Recent Results (from the past 240 hour(s))  MRSA PCR SCREENING     Status: None   Collection Time    08/21/13 10:29 PM      Result Value Range Status   MRSA by PCR NEGATIVE  NEGATIVE Final   Comment:            The GeneXpert MRSA Assay (FDA     approved for NASAL specimens     only), is one component of a     comprehensive MRSA colonization     surveillance program. It is not     intended to diagnose MRSA     infection nor to guide or     monitor treatment for     MRSA infections.    Dg Chest Port 1 View  07/26/2013   CLINICAL DATA:  Wheezing and weakness  EXAM: PORTABLE CHEST - 1 VIEW  COMPARISON:  07/24/2013  FINDINGS: Moderate cardiac enlargement stable. Mild vascular congestion with no evidence of actual pulmonary edema. Mild discoid atelectasis bilateral lower lung zones.  IMPRESSION: No change from prior  study. No acute findings.   Electronically Signed   By: Esperanza Heir M.D.   On: 07/26/2013 13:34   Dg Chest Portable 1 View  07/24/2013   CLINICAL DATA:  Shortness of breath and weakness.  EXAM: PORTABLE CHEST - 1 VIEW  COMPARISON:  05/06/2013.  FINDINGS: The heart is enlarged but stable. The mediastinal and hilar contours are unchanged. There is mild vascular congestion but no overt pulmonary edema. No pleural effusion or focal infiltrate. The bony thorax is intact.  IMPRESSION: Cardiac enlargement and mild vascular congestion but no edema or effusions.   Electronically Signed   By: Loralie Champagne M.D.   On: 07/24/2013 14:00   Dg Abd Acute W/chest  08/04/2013   CLINICAL DATA:  Constipation for 1 week.  EXAM: ACUTE ABDOMEN SERIES (ABDOMEN 2 VIEW & CHEST 1 VIEW)  COMPARISON:  07/26/2013  FINDINGS: Heart is enlarged. There are perihilar bronchitic changes, stable in appearance. There are no focal consolidations or pleural effusions. No free intraperitoneal air. Surgical clips overlie the right axillary region. Supine and erect views of the abdomen show mild gaseous distention of both large and small bowel loops, most consistent with ileus. There is a moderate amount of stool throughout colonic loops, including the cecum and rectosigmoid colon. Degenerative changes are seen in the spine. L2 wedge compression fracture is stable.  IMPRESSION: 1. Cardiomegaly and bronchitic changes. 2. No focal pulmonary abnormality identified. 3. Ileus. 4. Moderate stool burden. 5. Stable lumbar wedge compression fracture.  Electronically Signed   By: Rosalie Gums M.D.   On: 08/04/2013 13:53   Impression: Principal Problem:   Hyponatremia Active Problems:   DIABETES MELLITUS, TYPE II   HYPERTENSION   Atrial fibrillation, chronic   GERD   Obesity   Hypothyroidism   Weakness   Bradycardia   Hypokalemia  Plan:  She has had recurrent , intermittent mild hyponatremia followed recently by acute drop to 118. She was  symptomatic prior to admission. The etiology of severe hyponatremia is still not clear , appears to be multifacorial. Differentials include SIADH, Cerebral salt wasting, and ectopic ADH. Also contributing is the adrenal insufficiency she has and possible hypothyroidism. I have reordered these tests to be repeated, meanwhile she will be started on Dexamethasone 2mg  po qday. She is responding to the Conivaptan infusion with Na level rising to 125 meq/l. Continue same and monitoring in ICU for today. Patients with subacute to chronic hyponatremia will have a lower set osmostat and there is no need to correct Na to "normal" . She will tolerate steady Na level between 127-135 meq/l.  The low serum  Osmolality, absence of acid base disturbance , and the relatively low serum uric acid suggest SIADH. However , urine Na is usually above 40 in [facility], in his case it was found to be low.  The clinical signs of mild volume depletion is also against SIADH,where she is expected to be euvolemic hyponatremic, but may indicate cerebral salt wasting. There is no recent history to suggest head injury. In elderly patients with protracted hyponatremia, there is always  a concern of occult malignancy contributing ectopic ADH. On April 16/2014 Chest CT there was a  Ground-glass attenuation 7 mm right upper lobe pulmonary  Nodule. This was supposed to be follow-up another hest CT without contrast in 3 months  confirm persistence. I have not seen subsequent imaging in the Epic system. If not done at another facility, it should be repeated.  I will follow closely. Tagen Milby 08/22/2013, 6:40 AM

## 2013-08-22 NOTE — Progress Notes (Signed)
[name] PROGRESS NOTE  Kristin Logan YQI:[id number] DOB: [birth year] DOA: 08/21/2013 PCP: [name], MD  Assessment/Plan: 1. Hyponatremia. Patient was admitted to step down unit and started on conivaptan infusion per endocrinology.  Her serum sodium appears to be improving.  She does not appear to have any significant neurologic deficits at this time. Plans are to complete conivaptan infusion. Further work up for hyponatremia is underway, including thyroid studies and ACTH stim test. 2. Hypokalemia, improved with replacement 3. Diabetes, stable 4. HTN, she is not on any antihypertensives and blood pressure is stable.  Antihypertensive medications were recently discontinued. 5. Generalized weakness felt to be due to #1, continue to follow. 6. Atrial fibrillation, chronic.  She does not require any rate control drugs at this time.  She is anticoagulated. 7. GERD on PPI 8. Hypothyroidism, last TSH was normal.  Repeat studies pending.  Code Status: full code Family Communication: discussed with patient Disposition Plan: pending hospital course.   Consultants:  Endocrinology, Dr. Fransico Him  Procedures:    Antibiotics:    HPI/Subjective: Feels tired, weak, did not get much sleep overnight. Feels nauseous, no vomiting.  Objective: Filed Vitals:   08/22/13 0730  BP:   Pulse:   Temp: 97.8 F (36.6 C)  Resp:     Intake/Output Summary (Last 24 hours) at 08/22/13 0917 Last data filed at 08/22/13 0800  Gross per 24 hour  Intake      0 ml  Output   1500 ml  Net  -1500 ml   Filed Weights   08/21/13 1848 08/21/13 2200 08/22/13 0600  Weight: 86.183 kg (190 lb) 90.7 kg (199 lb 15.3 oz) 89.1 kg (196 lb 6.9 oz)    Exam:   General:  NAD, awake, alert, feels weak and tired  Cardiovascular: s1, s2, irregular  Respiratory: cta b  Abdomen: soft, nt, nd, bs+  Musculoskeletal: no edema b/l   Data Reviewed: Basic Metabolic Panel:  Recent Labs Lab  08/21/13 1343 08/21/13 2200 08/22/13 0050 08/22/13 0458 08/22/13 0814  NA 118* 118* 118* 125* 125*  K 2.8* 3.7  --  4.3  --   CL 81* 84*  --  89*  --   CO2 28 25  --  26  --   GLUCOSE 109* 101*  --  88  --   BUN 5* 5*  --  5*  --   CREATININE 0.67 0.53  --  0.61  --   CALCIUM 9.0 9.1  --  9.6  --   MG 2.1  --   --   --   --   PHOS 3.5  --   --   --   --    Liver Function Tests:  Recent Labs Lab 08/22/13 0458  AST 26  ALT 10  ALKPHOS 65  BILITOT 0.4  PROT 6.8  ALBUMIN 3.5   No results found for this basename: LIPASE, AMYLASE,  in the last 168 hours No results found for this basename: AMMONIA,  in the last 168 hours CBC:  Recent Labs Lab 08/21/13 1343 08/22/13 0458  WBC 6.3 8.5  NEUTROABS 2.7  --   HGB 12.8 12.8  HCT 34.9* 35.8*  MCV 81.7 82.9  PLT 232 244   Cardiac Enzymes: No results found for this basename: CKTOTAL, CKMB, CKMBINDEX, TROPONINI,  in the last 168 hours BNP (last 3 results)  Recent Labs  11/11/12 0108 01/14/13 2215 07/24/13 1322  PROBNP 354.6 386.4 439.5   CBG:  Recent Labs Lab  08/21/13 2058 08/22/13 0757  GLUCAP 108* 87    Recent Results (from the past 240 hour(s))  MRSA PCR SCREENING     Status: None   Collection Time    08/21/13 10:29 PM      Result Value Range Status   MRSA by PCR NEGATIVE  NEGATIVE Final   Comment:            The GeneXpert MRSA Assay (FDA     approved for NASAL specimens     only), is one component of a     comprehensive MRSA colonization     surveillance program. It is not     intended to diagnose MRSA     infection nor to guide or     monitor treatment for     MRSA infections.     Studies: No results found.  Scheduled Meds: . clotrimazole   Topical BID  . cosyntropin  0.25 mg Intravenous Once  . dexamethasone  2 mg Oral Daily  . enoxaparin (LOVENOX) injection  40 mg Subcutaneous Q24H  . insulin aspart  0-15 Units Subcutaneous TID WC  . pantoprazole  40 mg Oral Daily  . simvastatin  20 mg  Oral QHS  . sodium chloride  3 mL Intravenous Q12H  . warfarin  10 mg Oral Once  . Warfarin - Pharmacist Dosing Inpatient   Does not apply Q24H   Continuous Infusions: . sodium chloride 50 mL/hr at 08/22/13 0600    Principal Problem:   Hyponatremia Active Problems:   DIABETES MELLITUS, TYPE II   HYPERTENSION   Atrial fibrillation, chronic   GERD   Obesity   Hypothyroidism   Weakness   Bradycardia   Hypokalemia    Time spent:    MEMON,JEHANZEB  [name] Pager [phone]. If 7PM-7AM, please contact night-coverage at [email or web address], password [facility] 08/22/2013, 9:17 AM  LOS: 1 day

## 2013-08-22 NOTE — Progress Notes (Signed)
Dr. Fransico Him notified of sodium level of 127. No orders received at this time. Will continue to monitor.

## 2013-08-22 NOTE — Progress Notes (Signed)
Patient given decadron PO at 1045. First level of ACTH drawn at 1100, cortrosyn IV given at 1100 and follow up level scheduled to be drawn at 1130.

## 2013-08-22 NOTE — Progress Notes (Signed)
UR chart review completed.  

## 2013-08-22 NOTE — Progress Notes (Signed)
Na still reported as 118 MD made aware via text page. Value is same as before

## 2013-08-23 ENCOUNTER — Inpatient Hospital Stay (HOSPITAL_COMMUNITY): Payer: Medicare Other

## 2013-08-23 DIAGNOSIS — E2740 Unspecified adrenocortical insufficiency: Secondary | ICD-10-CM | POA: Diagnosis present

## 2013-08-23 DIAGNOSIS — E039 Hypothyroidism, unspecified: Secondary | ICD-10-CM

## 2013-08-23 DIAGNOSIS — E2749 Other adrenocortical insufficiency: Secondary | ICD-10-CM

## 2013-08-23 HISTORY — DX: Unspecified adrenocortical insufficiency: E27.40

## 2013-08-23 LAB — GLUCOSE, CAPILLARY
Glucose-Capillary: 199 mg/dL — ABNORMAL HIGH (ref 70–99)
Glucose-Capillary: 123 mg/dL — ABNORMAL HIGH (ref 70–99)
Glucose-Capillary: 134 mg/dL — ABNORMAL HIGH (ref 70–99)

## 2013-08-23 LAB — ACTH STIMULATION, 3 TIME POINTS
Cortisol, 60 Min: 9.6 ug/dL — ABNORMAL LOW (ref >20)
Cortisol, Base: 3.2 ug/dL

## 2013-08-23 LAB — BASIC METABOLIC PANEL
CO2: 26 mEq/L (ref 19–32)
Calcium: 10.1 mg/dL (ref 8.4–10.5)
GFR calc Af Amer: >90 mL/min (ref >90)
GFR calc non Af Amer: 81 mL/min — ABNORMAL LOW (ref >90)
Potassium: 4.5 mEq/L (ref 3.5–5.1)
Sodium: 129 mEq/L — ABNORMAL LOW (ref 135–145)

## 2013-08-23 LAB — PROTIME-INR: INR: 1.92 — ABNORMAL HIGH (ref 0.00–1.49)

## 2013-08-23 LAB — SODIUM: Sodium: 129 mEq/L — ABNORMAL LOW (ref 135–145)

## 2013-08-23 MED ORDER — DIPHENHYDRAMINE HCL 25 MG PO CAPS
25.0000 mg | ORAL_CAPSULE | Freq: Once | ORAL | Status: AC
  Administered 2013-08-23: 25 mg via ORAL
  Filled 2013-08-23: qty 1

## 2013-08-23 MED ORDER — WARFARIN SODIUM 10 MG PO TABS
10.0000 mg | ORAL_TABLET | Freq: Once | ORAL | Status: AC
  Administered 2013-08-23: 10 mg via ORAL
  Filled 2013-08-23: qty 1

## 2013-08-23 MED ORDER — PREDNISONE 10 MG PO TABS
15.0000 mg | ORAL_TABLET | Freq: Every day | ORAL | Status: DC
  Administered 2013-08-23 – 2013-08-25 (×3): 15 mg via ORAL
  Filled 2013-08-23 (×3): qty 2

## 2013-08-23 MED ORDER — IOHEXOL 300 MG/ML  SOLN
80.0000 mL | Freq: Once | INTRAMUSCULAR | Status: AC | PRN
  Administered 2013-08-23: 80 mL via INTRAVENOUS

## 2013-08-23 NOTE — Progress Notes (Signed)
[name] PROGRESS NOTE  Kristin Logan ZOX:[id number] DOB: [birth year] DOA: 08/21/2013 PCP: [name], MD  Assessment/Plan: 1. Hyponatremia. Patient was admitted to step down unit and started on conivaptan infusion per endocrinology.  Her serum sodium appears to be improving.  She does not appear to have any significant neurologic deficits at this time. Since her current sodium is 129 and she is clinically improved, we will discontinue conivaptan infusion.  Place her on fluid restriction. Per tolvaptan ordering criteria, she does not meet criteria to initiate this medicine.  Will follow serum sodiums for now.  She was also noted to have RUL nodule on chest CT in 4/14.  This will be repeated to monitor for any progression. 2. Hypokalemia, improved with replacement 3. Diabetes, stable 4. HTN, she is not on any antihypertensives and blood pressure is stable.  Antihypertensive medications were recently discontinued. 5. Generalized weakness felt to be due to #1, improving. 6. Atrial fibrillation, chronic.  She does not require any rate control drugs at this time.  She is anticoagulated. 7. GERD on PPI 8. Hypothyroidism, TSH is normal, but Free T3 and free T4 are low.  Started on synthroid per endocrinology 9. Adrenal insuffiencey. Patient had ACTH stimulation test yesterday and follow up cortisol values were low.  She has been started on prednisone per endocrinology.  Code Status: full code Family Communication: discussed with patient Disposition Plan: pending hospital course. Transfer to medical floor   Consultants:  Endocrinology, Dr. Fransico Him  Procedures:    Antibiotics:    HPI/Subjective: Sitting up in chair.  Family reports appetite improving, feeling better.  Objective: Filed Vitals:   08/23/13 0730  BP:   Pulse:   Temp: 97.7 F (36.5 C)  Resp:     Intake/Output Summary (Last 24 hours) at 08/23/13 1203 Last data filed at 08/23/13 0730  Gross per 24 hour   Intake    240 ml  Output   4200 ml  Net  -3960 ml   Filed Weights   08/21/13 2200 08/22/13 0600 08/23/13 0500  Weight: 90.7 kg (199 lb 15.3 oz) 89.1 kg (196 lb 6.9 oz) 89.5 kg (197 lb 5 oz)    Exam:   General:  NAD, awake, alert, feeling better, sitting up in chair  Cardiovascular: s1, s2, irregular  Respiratory: cta b  Abdomen: soft, nt, nd, bs+  Musculoskeletal: no edema b/l   Data Reviewed: Basic Metabolic Panel:  Recent Labs Lab 08/21/13 1343 08/21/13 2200  08/22/13 0458  08/22/13 1600 08/22/13 1941 08/22/13 2359 08/23/13 0406 08/23/13 0800  NA 118* 118*  < > 125*  < > 125* 127* 126* 129* 129*  K 2.8* 3.7  --  4.3  --   --   --   --  4.5  --   CL 81* 84*  --  89*  --   --   --   --  91*  --   CO2 28 25  --  26  --   --   --   --  26  --   GLUCOSE 109* 101*  --  88  --   --   --   --  127*  --   BUN 5* 5*  --  5*  --   --   --   --  6  --   CREATININE 0.67 0.53  --  0.61  --   --   --   --  0.69  --   CALCIUM 9.0  9.1  --  9.6  --   --   --   --  10.1  --   MG 2.1  --   --   --   --   --   --   --   --   --   PHOS 3.5  --   --   --   --   --   --   --   --   --   < > = values in this interval not displayed. Liver Function Tests:  Recent Labs Lab 08/22/13 0458  AST 26  ALT 10  ALKPHOS 65  BILITOT 0.4  PROT 6.8  ALBUMIN 3.5   No results found for this basename: LIPASE, AMYLASE,  in the last 168 hours No results found for this basename: AMMONIA,  in the last 168 hours CBC:  Recent Labs Lab 08/21/13 1343 08/22/13 0458  WBC 6.3 8.5  NEUTROABS 2.7  --   HGB 12.8 12.8  HCT 34.9* 35.8*  MCV 81.7 82.9  PLT 232 244   Cardiac Enzymes: No results found for this basename: CKTOTAL, CKMB, CKMBINDEX, TROPONINI,  in the last 168 hours BNP (last 3 results)  Recent Labs  11/11/12 0108 01/14/13 2215 07/24/13 1322  PROBNP 354.6 386.4 439.5   CBG:  Recent Labs Lab 08/22/13 1124 08/22/13 1614 08/22/13 2047 08/23/13 0748 08/23/13 1141  GLUCAP  99 145* 121* 106* 199*    Recent Results (from the past 240 hour(s))  MRSA PCR SCREENING     Status: None   Collection Time    08/21/13 10:29 PM      Result Value Range Status   MRSA by PCR NEGATIVE  NEGATIVE Final   Comment:            The GeneXpert MRSA Assay (FDA     approved for NASAL specimens     only), is one component of a     comprehensive MRSA colonization     surveillance program. It is not     intended to diagnose MRSA     infection nor to guide or     monitor treatment for     MRSA infections.     Studies: Dg Chest Port 1 View  08/22/2013   CLINICAL DATA:  Central venous catheter placement.  EXAM: PORTABLE CHEST - 1 VIEW  COMPARISON:  08/04/2013  FINDINGS: The cardiac silhouette is enlarged. The base status post median sternotomy and coronary artery bypass grafting. A left-sided central venous catheter is appreciated. Via a subclavian approach with tip projecting in the region of the superior vena caval right atrial junction. There is no evidence of pneumothorax. There is no evidence of focal infiltrates, effusions, nor edema. The osseous structures grossly unremarkable.  IMPRESSION: 1. Central venous catheter with tip projected regions superior vena cava right atrial junction. 2. No evidence of acute cardiopulmonary disease   Electronically Signed   By: Salome Holmes M.D.   On: 08/22/2013 10:34    Scheduled Meds: . clotrimazole   Topical BID  . enoxaparin (LOVENOX) injection  40 mg Subcutaneous Q24H  . insulin aspart  0-15 Units Subcutaneous TID WC  . levothyroxine  75 mcg Oral QAC breakfast  . pantoprazole  40 mg Oral Daily  . predniSONE  15 mg Oral Q breakfast  . simvastatin  20 mg Oral QHS  . sodium chloride  10-40 mL Intracatheter Q12H  . sodium chloride  3 mL Intravenous Q12H  . warfarin  10  mg Oral Once  . Warfarin - Pharmacist Dosing Inpatient   Does not apply Q24H   Continuous Infusions: . sodium chloride Stopped (08/22/13 1155)    Principal  Problem:   Hyponatremia Active Problems:   DIABETES MELLITUS, TYPE II   HYPERTENSION   Atrial fibrillation, chronic   GERD   Obesity   Hypothyroidism   Weakness   Bradycardia   Hypokalemia   Adrenal insufficiency    Time spent:    MEMON,JEHANZEB  [name] Pager [phone]. If 7PM-7AM, please contact night-coverage at [email or web address], password [facility] 08/23/2013, 12:03 PM  LOS: 2 days

## 2013-08-23 NOTE — Progress Notes (Signed)
Report called and given to Darvin Neighbours, RN. Patient alert, oriented and in stable condition at this time.

## 2013-08-23 NOTE — Progress Notes (Signed)
ANTICOAGULATION CONSULT NOTE - Initial Consult  Pharmacy Consult for Coumadin Indication: atrial fibrillation /hx AVR  Allergies  Allergen Reactions  . Nsaids     Bleeding ulcer  . Codeine Nausea And Vomiting  . Adhesive [Tape] Rash    Redness, and peeling skin off.    Patient Measurements: Height: 5\' 5"  (165.1 cm) Weight: 197 lb 5 oz (89.5 kg) IBW/kg (Calculated) : 57  Vital Signs: Temp: 97.7 F (36.5 C) (11/22 0400) Temp src: Oral (11/22 0400) BP: 126/80 mmHg (11/22 0400) Pulse Rate: 72 (11/22 0000)  Labs:  Recent Labs  08/21/13 1343 08/21/13 2200 08/22/13 0458 08/23/13 0406  HGB 12.8  --  12.8  --   HCT 34.9*  --  35.8*  --   PLT 232  --  244  --   LABPROT 17.9*  --  18.3* 21.4*  INR 1.52*  --  1.57* 1.92*  CREATININE 0.67 0.53 0.61 0.69   Estimated Creatinine Clearance: 63 ml/min (by C-G formula based on Cr of 0.69).  Medical History: Past Medical History  Diagnosis Date  . Diabetes mellitus   . Hyperlipidemia   . Eosinophilia   . Cataract   . Hypertension   . Atrial fibrillation prior to 2008    moderate biatrial enlargement in 2009; mild to moderate LVH with a low-normal EF  . GERD (gastroesophageal reflux disease)     Hemorrhoids; history of peptic ulcer disease  . DJD (degenerative joint disease) of knee   . Chronic anticoagulation   . Aortic valve disease     2009: mild stenosis and regurgitation; peak gradient of 30-35 mmHg , subsequent AVR at [facility]  . Incontinence     Mild  . Hip fracture, left 12/12/2012  . Foot injury 06/25/2012  . Degenerative joint disease of knee 01/29/2008    Bilateral    . BREAST CANCER, HX OF 01/29/2008    Lumpectomy, radiation therapy  Arimidex stopped in July 2013    . Syncope and collapse 11/11/2012  . Diverticulosis   . Umbilical hernia   . Cholelithiasis   . DDD (degenerative disc disease), lumbar   . Vertebral compression fracture     lumbar  . Chronic pain   . Paresthesias     feet  . Gastroparesis   .  Sleep apnea     CPAP  . Carcinoma of breast     Right mastectomy; radiation and hormonal therapy   Medications:  Scheduled:   Assessment: 79 yo F on chronic warfarin for hx Afib and aortic valve replacement.   Dose recently increased to 10mg  daily on 11/17 (from 5-7.5mg  daily).    INR has now increased to 1.92 after 10mg  PO x 2 days while in hospital.  Patient is also on enoxaparin 40mg  SQ daily.  Goal of Therapy:  INR 2-3 (per outpatient treatment plan goal)   Plan:  Repeat Coumadin 10mg  po x1 again today Continue to follow daily INR  Mady Gemma 08/23/2013,7:46 AM

## 2013-08-24 LAB — BASIC METABOLIC PANEL
BUN: 9 mg/dL (ref 6–23)
Chloride: 91 mEq/L — ABNORMAL LOW (ref 96–112)
Creatinine, Ser: 0.72 mg/dL (ref 0.50–1.10)
GFR calc Af Amer: >90 mL/min (ref >90)
GFR calc non Af Amer: 80 mL/min — ABNORMAL LOW (ref >90)

## 2013-08-24 LAB — GLUCOSE, CAPILLARY
Glucose-Capillary: 88 mg/dL (ref 70–99)
Glucose-Capillary: 132 mg/dL — ABNORMAL HIGH (ref 70–99)
Glucose-Capillary: 184 mg/dL — ABNORMAL HIGH (ref 70–99)

## 2013-08-24 LAB — SODIUM
Sodium: 124 mEq/L — ABNORMAL LOW (ref 135–145)
Sodium: 133 mEq/L — ABNORMAL LOW (ref 135–145)
Sodium: 134 mEq/L — ABNORMAL LOW (ref 135–145)

## 2013-08-24 LAB — PROTIME-INR: Prothrombin Time: 24 seconds — ABNORMAL HIGH (ref 11.6–15.2)

## 2013-08-24 MED ORDER — WARFARIN SODIUM 7.5 MG PO TABS
7.5000 mg | ORAL_TABLET | Freq: Once | ORAL | Status: AC
  Administered 2013-08-24: 7.5 mg via ORAL
  Filled 2013-08-24: qty 1

## 2013-08-24 NOTE — Progress Notes (Addendum)
ANTICOAGULATION CONSULT NOTE  Pharmacy Consult for Coumadin Indication: atrial fibrillation /hx AVR  Allergies  Allergen Reactions  . Nsaids     Bleeding ulcer  . Codeine Nausea And Vomiting  . Adhesive [Tape] Rash    Redness, and peeling skin off.    Patient Measurements: Height: 5\' 5"  (165.1 cm) Weight: 197 lb 5 oz (89.5 kg) IBW/kg (Calculated) : 57  Vital Signs: Temp: 98.1 F (36.7 C) (11/23 0502) Temp src: Oral (11/22 2121) BP: 187/103 mmHg (11/23 0502) Pulse Rate: 81 (11/23 0502)  Labs:  Recent Labs  08/21/13 1343  08/22/13 0458 08/23/13 0406 08/24/13 0448  HGB 12.8  --  12.8  --   --   HCT 34.9*  --  35.8*  --   --   PLT 232  --  244  --   --   LABPROT 17.9*  --  18.3* 21.4* 24.0*  INR 1.52*  --  1.57* 1.92* 2.23*  CREATININE 0.67  < > 0.61 0.69 0.72  < > = values in this interval not displayed. Estimated Creatinine Clearance: 63 ml/min (by C-G formula based on Cr of 0.72).  Medical History: Past Medical History  Diagnosis Date  . Diabetes mellitus   . Hyperlipidemia   . Eosinophilia   . Cataract   . Hypertension   . Atrial fibrillation prior to 2008    moderate biatrial enlargement in 2009; mild to moderate LVH with a low-normal EF  . GERD (gastroesophageal reflux disease)     Hemorrhoids; history of peptic ulcer disease  . DJD (degenerative joint disease) of knee   . Chronic anticoagulation   . Aortic valve disease     2009: mild stenosis and regurgitation; peak gradient of 30-35 mmHg , subsequent AVR at [facility]  . Incontinence     Mild  . Hip fracture, left 12/12/2012  . Foot injury 06/25/2012  . Degenerative joint disease of knee 01/29/2008    Bilateral    . BREAST CANCER, HX OF 01/29/2008    Lumpectomy, radiation therapy  Arimidex stopped in July 2013    . Syncope and collapse 11/11/2012  . Diverticulosis   . Umbilical hernia   . Cholelithiasis   . DDD (degenerative disc disease), lumbar   . Vertebral compression fracture     lumbar  .  Chronic pain   . Paresthesias     feet  . Gastroparesis   . Sleep apnea     CPAP  . Carcinoma of breast     Right mastectomy; radiation and hormonal therapy   Medications:  Prescriptions prior to admission  Medication Sig Dispense Refill  . clotrimazole-betamethasone (LOTRISONE) cream Apply 1 application topically 2 (two) times daily.      . diclofenac sodium (VOLTAREN) 1 % GEL Apply 1 application topically daily as needed (for knee pain).      Marland Kitchen glycerin adult (GLYCERIN ADULT) 2 G SUPP Place 1 suppository rectally once.  20 each  2  . HYDROcodone-acetaminophen (NORCO/VICODIN) 5-325 MG per tablet Take 1 tablet by mouth at bedtime as needed for pain.      . Multiple Vitamins-Minerals (CENTRUM SILVER ULTRA WOMENS PO) Take 1 tablet by mouth daily.       Marland Kitchen omeprazole (PRILOSEC) 20 MG capsule Take 1 capsule (20 mg total) by mouth 2 (two) times daily.  60 capsule  5  . simvastatin (ZOCOR) 20 MG tablet Take 1 tablet (20 mg total) by mouth at bedtime.  30 tablet  6  . sodium chloride  1 G tablet Take 1 tablet (1 g total) by mouth 2 (two) times daily.  60 tablet  0  . warfarin (COUMADIN) 5 MG tablet Take 5 mg by mouth daily at 6 PM. Has taken 10 mg (2 tablets) for the past 3 nights, Home Health Care was supposed to come out today AND CHECK PATIENTS  INR .  Normally, she takes 1 tablet (5mg ) on Mondays, Wednesdays and  Fridays. She normally takes 1 1/2 tablets (7.5mg ) on Tuesdays, Thursdays, Saturdays and Sundays      . warfarin (COUMADIN) 5 MG tablet take as directed      . acetaminophen (TYLENOL) 650 MG CR tablet Take 1 tablet (650 mg total) by mouth every 8 (eight) hours as needed for pain.      Marland Kitchen albuterol (PROVENTIL HFA;VENTOLIN HFA) 108 (90 BASE) MCG/ACT inhaler Inhale 2 puffs into the lungs every 4 (four) hours as needed for wheezing.  1 Inhaler  2  . albuterol (PROVENTIL) (2.5 MG/3ML) 0.083% nebulizer solution Take 3 mLs (2.5 mg total) by nebulization every 6 (six) hours as needed for wheezing.   75 mL  3   Assessment: 79 yo F on chronic warfarin for hx Afib and aortic valve replacement.   Dose recently increased to 10mg  daily on 11/17 (from 5-7.5mg  daily).    INR now therapeutic.    Goal of Therapy:  INR 2-3 (per outpatient treatment plan goal)   Plan:  Coumadin 7.5 mg po x 1 again today. Continue to follow daily INR. Stop Lovenox.  Mady Gemma 08/24/2013,9:00 AM

## 2013-08-24 NOTE — Progress Notes (Signed)
[name] PROGRESS NOTE  MAURICE RAMSEUR UUV:[id number] DOB: Jul 03, 1932 DOA: 08/21/2013 PCP: [name], MD  Assessment/Plan: 1. Hyponatremia. Patient was admitted to step down unit and started on conivaptan infusion per endocrinology.  Her serum sodium quickly improved and she was taken off the conivaptan infusion. Since that time, her serum sodium appears to stabilize her on 130. She is currently on a free water restriction. Adrenals insufficiency may be playing a role here since it appears that her hyponatremia may have improved since he started on steroids. CT scan of the chest was repeated to evaluate for any malignancy, and no suspicious nodules are noted. We'll follow up with endocrinology tomorrow, and if stable, can possibly consider discharge home tomorrow. 2. Hypokalemia, improved with replacement 3. Diabetes, stable 4. HTN, patient is currently not taking any anti-hypertensive medications since these were recently discontinued. We will resume Norvasc at this time.   5. Generalized weakness felt to be due to #1, improving. 6. Atrial fibrillation, chronic.  She does not require any rate control drugs at this time.  She is anticoagulated. 7. GERD on PPI 8. Hypothyroidism, TSH is normal, but Free T3 and free T4 are low.  Started on synthroid per endocrinology 9. Adrenal insuffiencey. Patient had ACTH stimulation test and follow up cortisol values were low.  She has been started on prednisone per endocrinology.  Code Status: full code Family Communication: discussed with patient Disposition Plan: Hopeful for discharge home in the next 24 hours   Consultants:  Endocrinology, Dr. Fransico Him  Procedures:    Antibiotics:    HPI/Subjective: Patient is doing well. Appetite is improving.  Objective: Filed Vitals:   08/24/13 1458  BP: 167/90  Pulse: 69  Temp: 97.5 F (36.4 C)  Resp: 20    Intake/Output Summary (Last 24 hours) at 08/24/13 2031 Last data filed at  08/24/13 1730  Gross per 24 hour  Intake    720 ml  Output   1400 ml  Net   -680 ml   Filed Weights   08/21/13 2200 08/22/13 0600 08/23/13 0500  Weight: 90.7 kg (199 lb 15.3 oz) 89.1 kg (196 lb 6.9 oz) 89.5 kg (197 lb 5 oz)    Exam:   General:  NAD, awake, alert, feeling better, sitting up in chair  Cardiovascular: s1, s2, irregular  Respiratory: cta b  Abdomen: soft, nt, nd, bs+  Musculoskeletal: no edema b/l   Data Reviewed: Basic Metabolic Panel:  Recent Labs Lab 08/21/13 1343 08/21/13 2200  08/22/13 0458  08/23/13 0406  08/23/13 2342 08/24/13 0448 08/24/13 0811 08/24/13 1157 08/24/13 1708  NA 118* 118*  < > 125*  < > 129*  < > 124* 127* 134* 131* 130*  K 2.8* 3.7  --  4.3  --  4.5  --   --  4.2  --   --   --   CL 81* 84*  --  89*  --  91*  --   --  91*  --   --   --   CO2 28 25  --  26  --  26  --   --  27  --   --   --   GLUCOSE 109* 101*  --  88  --  127*  --   --  84  --   --   --   BUN 5* 5*  --  5*  --  6  --   --  9  --   --   --  CREATININE 0.67 0.53  --  0.61  --  0.69  --   --  0.72  --   --   --   CALCIUM 9.0 9.1  --  9.6  --  10.1  --   --  9.8  --   --   --   MG 2.1  --   --   --   --   --   --   --   --   --   --   --   PHOS 3.5  --   --   --   --   --   --   --   --   --   --   --   < > = values in this interval not displayed. Liver Function Tests:  Recent Labs Lab 08/22/13 0458  AST 26  ALT 10  ALKPHOS 65  BILITOT 0.4  PROT 6.8  ALBUMIN 3.5   No results found for this basename: LIPASE, AMYLASE,  in the last 168 hours No results found for this basename: AMMONIA,  in the last 168 hours CBC:  Recent Labs Lab 08/21/13 1343 08/22/13 0458  WBC 6.3 8.5  NEUTROABS 2.7  --   HGB 12.8 12.8  HCT 34.9* 35.8*  MCV 81.7 82.9  PLT 232 244   Cardiac Enzymes: No results found for this basename: CKTOTAL, CKMB, CKMBINDEX, TROPONINI,  in the last 168 hours BNP (last 3 results)  Recent Labs  11/11/12 0108 01/14/13 2215 07/24/13 1322   PROBNP 354.6 386.4 439.5   CBG:  Recent Labs Lab 08/23/13 1701 08/23/13 2116 08/24/13 0745 08/24/13 1203 08/24/13 1702  GLUCAP 123* 134* 88 132* 184*    Recent Results (from the past 240 hour(s))  MRSA PCR SCREENING     Status: None   Collection Time    08/21/13 10:29 PM      Result Value Range Status   MRSA by PCR NEGATIVE  NEGATIVE Final   Comment:            The GeneXpert MRSA Assay (FDA     approved for NASAL specimens     only), is one component of a     comprehensive MRSA colonization     surveillance program. It is not     intended to diagnose MRSA     infection nor to guide or     monitor treatment for     MRSA infections.     Studies: Ct Chest W Contrast  08/23/2013   CLINICAL DATA:  Revaluation of small ground-glass density left upper lobe.  EXAM: CT CHEST WITH CONTRAST  TECHNIQUE: Multidetector CT imaging of the chest was performed during intravenous contrast administration.  CONTRAST:  80mL OMNIPAQUE IOHEXOL 300 MG/ML  SOLN  COMPARISON:  01/15/2013  FINDINGS: The thoracic inlet is unremarkable.  Multichamber cardiac enlargement is identified. Coarse coronary artery vascular calcifications are identified. The left sinus a central venous catheter is appreciated with tip projecting in the region of the superior vena cava right atrial junction. There is no evidence of mediastinal masses nor adenopathy.  Appears described ground-glass nodule within the left lung apex is not appreciated on the present study. No further pulmonary nodules or masses are identified. Discoid atelectasis, minimal, is identified within the posterior aspect of the base of the right middle lobe.  The visualized upper abdominal viscera demonstrate no gross abnormalities.  IMPRESSION: 1. Interval resolution of the ground-glass nodule described within the left lung apex.  2. Cardiomegaly 3. No evidence of focal acute abnormalities.   Electronically Signed   By: Salome Holmes M.D.   On: 08/23/2013 16:30     Scheduled Meds: . clotrimazole   Topical BID  . insulin aspart  0-15 Units Subcutaneous TID WC  . levothyroxine  75 mcg Oral QAC breakfast  . pantoprazole  40 mg Oral Daily  . predniSONE  15 mg Oral Q breakfast  . simvastatin  20 mg Oral QHS  . sodium chloride  10-40 mL Intracatheter Q12H  . sodium chloride  3 mL Intravenous Q12H  . Warfarin - Pharmacist Dosing Inpatient   Does not apply Q24H   Continuous Infusions:    Principal Problem:   Hyponatremia Active Problems:   DIABETES MELLITUS, TYPE II   HYPERTENSION   Atrial fibrillation, chronic   GERD   Obesity   Hypothyroidism   Weakness   Bradycardia   Hypokalemia   Adrenal insufficiency    Time spent:    Errin Whitelaw  [name] Pager [phone]. If 7PM-7AM, please contact night-coverage at [email or web address], password [facility] 08/24/2013, 8:31 PM  LOS: 3 days

## 2013-08-24 NOTE — Progress Notes (Deleted)
ANTICOAGULATION CONSULT NOTE - Initial Consult  Pharmacy Consult for Coumadin Indication: atrial fibrillation /hx AVR  Allergies  Allergen Reactions  . Nsaids     Bleeding ulcer  . Codeine Nausea And Vomiting  . Adhesive [Tape] Rash    Redness, and peeling skin off.    Patient Measurements: Height: 5\' 5"  (165.1 cm) Weight: 197 lb 5 oz (89.5 kg) IBW/kg (Calculated) : 57  Vital Signs: Temp: 98.1 F (36.7 C) (11/23 0502) Temp src: Oral (11/22 2121) BP: 187/103 mmHg (11/23 0502) Pulse Rate: 81 (11/23 0502)  Labs:  Recent Labs  08/21/13 1343  08/22/13 0458 08/23/13 0406 08/24/13 0448  HGB 12.8  --  12.8  --   --   HCT 34.9*  --  35.8*  --   --   PLT 232  --  244  --   --   LABPROT 17.9*  --  18.3* 21.4* 24.0*  INR 1.52*  --  1.57* 1.92* 2.23*  CREATININE 0.67  < > 0.61 0.69 0.72  < > = values in this interval not displayed. Estimated Creatinine Clearance: 63 ml/min (by C-G formula based on Cr of 0.72).  Medical History: Past Medical History  Diagnosis Date  . Diabetes mellitus   . Hyperlipidemia   . Eosinophilia   . Cataract   . Hypertension   . Atrial fibrillation prior to 2008    moderate biatrial enlargement in 2009; mild to moderate LVH with a low-normal EF  . GERD (gastroesophageal reflux disease)     Hemorrhoids; history of peptic ulcer disease  . DJD (degenerative joint disease) of knee   . Chronic anticoagulation   . Aortic valve disease     2009: mild stenosis and regurgitation; peak gradient of 30-35 mmHg , subsequent AVR at [facility]  . Incontinence     Mild  . Hip fracture, left 12/12/2012  . Foot injury 06/25/2012  . Degenerative joint disease of knee 01/29/2008    Bilateral    . BREAST CANCER, HX OF 01/29/2008    Lumpectomy, radiation therapy  Arimidex stopped in July 2013    . Syncope and collapse 11/11/2012  . Diverticulosis   . Umbilical hernia   . Cholelithiasis   . DDD (degenerative disc disease), lumbar   . Vertebral compression fracture     lumbar  . Chronic pain   . Paresthesias     feet  . Gastroparesis   . Sleep apnea     CPAP  . Carcinoma of breast     Right mastectomy; radiation and hormonal therapy   Medications:  Prescriptions prior to admission  Medication Sig Dispense Refill  . clotrimazole-betamethasone (LOTRISONE) cream Apply 1 application topically 2 (two) times daily.      . diclofenac sodium (VOLTAREN) 1 % GEL Apply 1 application topically daily as needed (for knee pain).      Marland Kitchen glycerin adult (GLYCERIN ADULT) 2 G SUPP Place 1 suppository rectally once.  20 each  2  . HYDROcodone-acetaminophen (NORCO/VICODIN) 5-325 MG per tablet Take 1 tablet by mouth at bedtime as needed for pain.      . Multiple Vitamins-Minerals (CENTRUM SILVER ULTRA WOMENS PO) Take 1 tablet by mouth daily.       Marland Kitchen omeprazole (PRILOSEC) 20 MG capsule Take 1 capsule (20 mg total) by mouth 2 (two) times daily.  60 capsule  5  . simvastatin (ZOCOR) 20 MG tablet Take 1 tablet (20 mg total) by mouth at bedtime.  30 tablet  6  .  sodium chloride 1 G tablet Take 1 tablet (1 g total) by mouth 2 (two) times daily.  60 tablet  0  . warfarin (COUMADIN) 5 MG tablet Take 5 mg by mouth daily at 6 PM. Has taken 10 mg (2 tablets) for the past 3 nights, Home Health Care was supposed to come out today AND CHECK PATIENTS  INR .  Normally, she takes 1 tablet (5mg ) on Mondays, Wednesdays and  Fridays. She normally takes 1 1/2 tablets (7.5mg ) on Tuesdays, Thursdays, Saturdays and Sundays      . warfarin (COUMADIN) 5 MG tablet take as directed      . acetaminophen (TYLENOL) 650 MG CR tablet Take 1 tablet (650 mg total) by mouth every 8 (eight) hours as needed for pain.      Marland Kitchen albuterol (PROVENTIL HFA;VENTOLIN HFA) 108 (90 BASE) MCG/ACT inhaler Inhale 2 puffs into the lungs every 4 (four) hours as needed for wheezing.  1 Inhaler  2  . albuterol (PROVENTIL) (2.5 MG/3ML) 0.083% nebulizer solution Take 3 mLs (2.5 mg total) by nebulization every 6 (six) hours as needed  for wheezing.  75 mL  3   Assessment: 79 yo F on chronic warfarin for hx Afib and aortic valve replacement.   Dose recently increased to 10mg  daily on 11/17 (from 5-7.5mg  daily).    INR now therapeutic.    Goal of Therapy:  INR 2-3 (per outpatient treatment plan goal)   Plan:  Coumadin 7.5 mg po x 1 again today. Continue to follow daily INR.  Mady Gemma 08/24/2013,8:46 AM

## 2013-08-25 LAB — BASIC METABOLIC PANEL
BUN: 11 mg/dL (ref 6–23)
Calcium: 9.4 mg/dL (ref 8.4–10.5)
Chloride: 100 mEq/L (ref 96–112)
Creatinine, Ser: 0.7 mg/dL (ref 0.50–1.10)
GFR calc Af Amer: >90 mL/min (ref >90)
Sodium: 138 mEq/L (ref 135–145)

## 2013-08-25 LAB — PROTIME-INR
INR: 3.2 — ABNORMAL HIGH (ref 0.00–1.49)
Prothrombin Time: 31.6 seconds — ABNORMAL HIGH (ref 11.6–15.2)

## 2013-08-25 LAB — GLUCOSE, CAPILLARY: Glucose-Capillary: 77 mg/dL (ref 70–99)

## 2013-08-25 MED ORDER — PREDNISONE 5 MG PO TABS: 15.0000 mg | ORAL_TABLET | Freq: Every day | ORAL | Status: DC

## 2013-08-25 MED ORDER — LEVOTHYROXINE SODIUM 75 MCG PO TABS: 75.0000 ug | ORAL_TABLET | Freq: Every day | ORAL | Status: DC

## 2013-08-25 MED ORDER — AMLODIPINE BESYLATE 5 MG PO TABS
5.0000 mg | ORAL_TABLET | Freq: Every day | ORAL | Status: DC
  Administered 2013-08-25: 5 mg via ORAL
  Filled 2013-08-25: qty 1

## 2013-08-25 MED ORDER — AMLODIPINE BESYLATE 5 MG PO TABS: 5.0000 mg | ORAL_TABLET | Freq: Every day | ORAL | Status: DC

## 2013-08-25 NOTE — Discharge Summary (Signed)
Physician Discharge Summary  Kristin Logan WUJ:[id number] DOB: [birth year] DOA: 08/21/2013  PCP: [name], MD  Admit date: 08/21/2013 Discharge date: 08/25/2013  Time spent: 40 minutes  Recommendations for Outpatient Follow-up:  1. Dr Fransico Him 09/01/13 at 2pm 2. Lab work on 1128/14  Discharge Diagnoses:  Principal Problem:   Hyponatremia Active Problems:   DIABETES MELLITUS, TYPE II   HYPERTENSION   Atrial fibrillation, chronic   GERD   Obesity   Hypothyroidism   Weakness   Bradycardia   Hypokalemia   Adrenal insufficiency   Discharge Condition: stable  Diet recommendation: carb modified  Filed Weights   08/22/13 0600 08/23/13 0500 08/25/13 0617  Weight: 89.1 kg (196 lb 6.9 oz) 89.5 kg (197 lb 5 oz) 89.3 kg (196 lb 13.9 oz)    History of present illness:  Kristin Logan is a 79 y.o. female with a past medical history that includes A. fib, aortic valve disease status post aVR with bioprosthetic device on chronic anticoagulation, hypothyroidism, depression and persistent hyponatremia presented to the emergency department on 08/21/13 with the chief complaint of abnormal labs. Information obtained from the daughter who is the primary caregiver and resides with the patient. Patient was discharged 3 weeks prior for same. Daughter stated that she hasd been doing "okay since that time. She went to PCPs office 4 days prior for routine blood work and was called and told that the sodium level had trended down to 123. It was recommended that she come to the emergency department. Patient complained of generalized weakness worsening over the previous 3 days. She denied any chest pain palpitation shortness of breath headache visual disturbances. She denied any abdominal pain dysuria hematuria frequency or urgency. She did report that she felt nauseated every morning and has had 2 episodes of emesis since her discharge 3 weeks prior. She denied any coffee ground emesis. Also indicated  that her appetite waxes and wanes but was unsure if she'd had any unintentional weight loss. He denied fever or chills diarrhea or constipation or melena. Workup in the emergency room notable for sodium level of 118, potassium 2.8, chloride 81 serum glucose 109. She was afebrile and hemodynamically stable in the emergency department.    Hospital Course:  1. Hyponatremia. Patient was admitted to step down unit and started on conivaptan infusion per endocrinology. Her serum sodium quickly improved and she was taken off the conivaptan infusion. Her serum sodium stabilized at 138 on discharge. She was also on a free water restriction. Adrenals insufficiency may be playing a role here since it appears that her hyponatremia may have improved since he started on steroids. CT scan of the chest was repeated to evaluate for any malignancy, and no suspicious nodules are noted. Patient will have OP labs 4 days from discharge and appointment with Dr. Fransico Him 09/01/13. 2. Hypokalemia, resolved with replacement 3. Diabetes, stable during this hospitalization.  4. HTN, patient is currently not taking any anti-hypertensive medications since these were recently discontinued. We will resume Norvasc at this time. Recommend OP follow up with PCP for optimal BP control.  5. Generalized weakness felt to be due to #1, Ambulating in room independently at discharge.  6. Atrial fibrillation, chronic. She does not require any rate control drugs at this time. She is anticoagulated. 7. GERD on PPI 8. Hypothyroidism, TSH is normal, but Free T3 and free T4 are low. Started on synthroid per endocrinology. OP follow up as above 9. Adrenal insuffiencey. Patient had ACTH stimulation test and follow  up cortisol values were low. She has been started on prednisone per endocrinology. OP follow up as above     Procedures:  PICC line  Consultations:  Dr. Fransico Him  Discharge Exam: Filed Vitals:   08/25/13 0617  BP: 155/73  Pulse: 66   Temp: 98 F (36.7 C)  Resp: 18    General: well nourished NAD Cardiovascular: irregularly irregular No LE edema  Respiratory: normal effort BS clear bilaterally no wheeze  Discharge Instructions       Future Appointments Provider Department Dept Phone   11/07/2013 12:30 PM Salley Scarlet, MD [facility] [phone]       Medication List    STOP taking these medications       sodium chloride 1 G tablet      TAKE these medications       acetaminophen 650 MG CR tablet  Commonly known as:  TYLENOL  Take 1 tablet (650 mg total) by mouth every 8 (eight) hours as needed for pain.     albuterol 108 (90 BASE) MCG/ACT inhaler  Commonly known as:  PROVENTIL HFA;VENTOLIN HFA  Inhale 2 puffs into the lungs every 4 (four) hours as needed for wheezing.     albuterol (2.5 MG/3ML) 0.083% nebulizer solution  Commonly known as:  PROVENTIL  Take 3 mLs (2.5 mg total) by nebulization every 6 (six) hours as needed for wheezing.     amLODipine 5 MG tablet  Commonly known as:  NORVASC  Take 1 tablet (5 mg total) by mouth daily.     CENTRUM SILVER ULTRA WOMENS PO  Take 1 tablet by mouth daily.     clotrimazole-betamethasone cream  Commonly known as:  LOTRISONE  Apply 1 application topically 2 (two) times daily.     diclofenac sodium 1 % Gel  Commonly known as:  VOLTAREN  Apply 1 application topically daily as needed (for knee pain).     glycerin adult 2 G Supp  Commonly known as:  glycerin adult  Place 1 suppository rectally once.     HYDROcodone-acetaminophen 5-325 MG per tablet  Commonly known as:  NORCO/VICODIN  Take 1 tablet by mouth at bedtime as needed for pain.     levothyroxine 75 MCG tablet  Commonly known as:  SYNTHROID, LEVOTHROID  Take 1 tablet (75 mcg total) by mouth daily before breakfast.     omeprazole 20 MG capsule  Commonly known as:  PRILOSEC  Take 1 capsule (20 mg total) by mouth 2 (two) times daily.     predniSONE 5 MG tablet   Commonly known as:  DELTASONE  Take 3 tablets (15 mg total) by mouth daily with breakfast.     simvastatin 20 MG tablet  Commonly known as:  ZOCOR  Take 1 tablet (20 mg total) by mouth at bedtime.     warfarin 5 MG tablet  Commonly known as:  COUMADIN  take as directed     warfarin 5 MG tablet  Commonly known as:  COUMADIN  - Take 5 mg by mouth daily at 6 PM. Has taken 10 mg (2 tablets) for the past 3 nights, Home Health Care was supposed to come out today AND CHECK PATIENTS  INR .  Normally, she takes 1 tablet (5mg ) on Mondays, Wednesdays and   - Fridays. She normally takes 1 1/2 tablets (7.5mg ) on Tuesdays, Thursdays, Saturdays and Sundays       Allergies  Allergen Reactions  . Nsaids     Bleeding ulcer  .  Codeine Nausea And Vomiting  . Adhesive [Tape] Rash    Redness, and peeling skin off.    Follow-up Information   Follow up with [facility].   Contact information:   [address] [place] [address] [phone]      Follow up with NIDA,GEBRESELASSIE, MD On 09/01/2013. (appointment at 2pm)    Specialty:  Endocrinology   Contact information:   [address] [place] [address] [phone]        The results of significant diagnostics from this hospitalization (including imaging, microbiology, ancillary and laboratory) are listed below for reference.    Significant Diagnostic Studies: Ct Chest W Contrast  08/23/2013   CLINICAL DATA:  Revaluation of small ground-glass density left upper lobe.  EXAM: CT CHEST WITH CONTRAST  TECHNIQUE: Multidetector CT imaging of the chest was performed during intravenous contrast administration.  CONTRAST:  80mL OMNIPAQUE IOHEXOL 300 MG/ML  SOLN  COMPARISON:  01/15/2013  FINDINGS: The thoracic inlet is unremarkable.  Multichamber cardiac enlargement is identified. Coarse coronary artery vascular calcifications are identified. The left sinus a central venous catheter is appreciated with tip projecting in the region  of the superior vena cava right atrial junction. There is no evidence of mediastinal masses nor adenopathy.  Appears described ground-glass nodule within the left lung apex is not appreciated on the present study. No further pulmonary nodules or masses are identified. Discoid atelectasis, minimal, is identified within the posterior aspect of the base of the right middle lobe.  The visualized upper abdominal viscera demonstrate no gross abnormalities.  IMPRESSION: 1. Interval resolution of the ground-glass nodule described within the left lung apex. 2. Cardiomegaly 3. No evidence of focal acute abnormalities.   Electronically Signed   By: Salome Holmes M.D.   On: 08/23/2013 16:30   Dg Chest Port 1 View  08/22/2013   CLINICAL DATA:  Central venous catheter placement.  EXAM: PORTABLE CHEST - 1 VIEW  COMPARISON:  08/04/2013  FINDINGS: The cardiac silhouette is enlarged. The base status post median sternotomy and coronary artery bypass grafting. A left-sided central venous catheter is appreciated. Via a subclavian approach with tip projecting in the region of the superior vena caval right atrial junction. There is no evidence of pneumothorax. There is no evidence of focal infiltrates, effusions, nor edema. The osseous structures grossly unremarkable.  IMPRESSION: 1. Central venous catheter with tip projected regions superior vena cava right atrial junction. 2. No evidence of acute cardiopulmonary disease   Electronically Signed   By: Salome Holmes M.D.   On: 08/22/2013 10:34   Dg Chest Port 1 View  07/26/2013   CLINICAL DATA:  Wheezing and weakness  EXAM: PORTABLE CHEST - 1 VIEW  COMPARISON:  07/24/2013  FINDINGS: Moderate cardiac enlargement stable. Mild vascular congestion with no evidence of actual pulmonary edema. Mild discoid atelectasis bilateral lower lung zones.  IMPRESSION: No change from prior study. No acute findings.   Electronically Signed   By: Esperanza Heir M.D.   On: 07/26/2013 13:34   Dg  Abd Acute W/chest  08/04/2013   CLINICAL DATA:  Constipation for 1 week.  EXAM: ACUTE ABDOMEN SERIES (ABDOMEN 2 VIEW & CHEST 1 VIEW)  COMPARISON:  07/26/2013  FINDINGS: Heart is enlarged. There are perihilar bronchitic changes, stable in appearance. There are no focal consolidations or pleural effusions. No free intraperitoneal air. Surgical clips overlie the right axillary region. Supine and erect views of the abdomen show mild gaseous distention of both large and small  bowel loops, most consistent with ileus. There is a moderate amount of stool throughout colonic loops, including the cecum and rectosigmoid colon. Degenerative changes are seen in the spine. L2 wedge compression fracture is stable.  IMPRESSION: 1. Cardiomegaly and bronchitic changes. 2. No focal pulmonary abnormality identified. 3. Ileus. 4. Moderate stool burden. 5. Stable lumbar wedge compression fracture.   Electronically Signed   By: Rosalie Gums M.D.   On: 08/04/2013 13:53    Microbiology: Recent Results (from the past 240 hour(s))  MRSA PCR SCREENING     Status: None   Collection Time    08/21/13 10:29 PM      Result Value Range Status   MRSA by PCR NEGATIVE  NEGATIVE Final   Comment:            The GeneXpert MRSA Assay (FDA     approved for NASAL specimens     only), is one component of a     comprehensive MRSA colonization     surveillance program. It is not     intended to diagnose MRSA     infection nor to guide or     monitor treatment for     MRSA infections.     Labs: Basic Metabolic Panel:  Recent Labs Lab 08/21/13 1343 08/21/13 2200  08/22/13 0458  08/23/13 0406  08/24/13 0448 08/24/13 0811 08/24/13 1157 08/24/13 1708 08/24/13 1924 08/25/13 0546  NA 118* 118*  < > 125*  < > 129*  < > 127* 134* 131* 130* 133* 138  K 2.8* 3.7  --  4.3  --  4.5  --  4.2  --   --   --   --  4.3  CL 81* 84*  --  89*  --  91*  --  91*  --   --   --   --  100  CO2 28 25  --  26  --  26  --  27  --   --   --   --  30   GLUCOSE 109* 101*  --  88  --  127*  --  84  --   --   --   --  78  BUN 5* 5*  --  5*  --  6  --  9  --   --   --   --  11  CREATININE 0.67 0.53  --  0.61  --  0.69  --  0.72  --   --   --   --  0.70  CALCIUM 9.0 9.1  --  9.6  --  10.1  --  9.8  --   --   --   --  9.4  MG 2.1  --   --   --   --   --   --   --   --   --   --   --   --   PHOS 3.5  --   --   --   --   --   --   --   --   --   --   --   --   < > = values in this interval not displayed. Liver Function Tests:  Recent Labs Lab 08/22/13 0458  AST 26  ALT 10  ALKPHOS 65  BILITOT 0.4  PROT 6.8  ALBUMIN 3.5   No results found for this basename: LIPASE, AMYLASE,  in the last 168 hours No  results found for this basename: AMMONIA,  in the last 168 hours CBC:  Recent Labs Lab 08/21/13 1343 08/22/13 0458  WBC 6.3 8.5  NEUTROABS 2.7  --   HGB 12.8 12.8  HCT 34.9* 35.8*  MCV 81.7 82.9  PLT 232 244   Cardiac Enzymes: No results found for this basename: CKTOTAL, CKMB, CKMBINDEX, TROPONINI,  in the last 168 hours BNP: BNP (last 3 results)  Recent Labs  11/11/12 0108 01/14/13 2215 07/24/13 1322  PROBNP 354.6 386.4 439.5   CBG:  Recent Labs Lab 08/23/13 2116 08/24/13 0745 08/24/13 1203 08/24/13 1702 08/25/13 0729  GLUCAP 134* 88 132* 184* 77       Signed:  Stefanee Mckell M  [name] 08/25/2013, 9:37 AM

## 2013-08-25 NOTE — Progress Notes (Signed)
D/c instructions reviewed with patient and daughter-in-law.  Verbalized understanding. Pt dc'd to home with daughter-in-law. Schonewitz, Candelaria Stagers 08/25/2013

## 2013-08-25 NOTE — Progress Notes (Signed)
ANTICOAGULATION CONSULT NOTE  Pharmacy Consult for Coumadin Indication: atrial fibrillation /hx AVR  Allergies  Allergen Reactions  . Nsaids     Bleeding ulcer  . Codeine Nausea And Vomiting  . Adhesive [Tape] Rash    Redness, and peeling skin off.    Patient Measurements: Height: 5\' 5"  (165.1 cm) Weight: 196 lb 13.9 oz (89.3 kg) IBW/kg (Calculated) : 57  Vital Signs: Temp: 98 F (36.7 C) (11/24 0617) BP: 155/73 mmHg (11/24 0617) Pulse Rate: 66 (11/24 0617)  Labs:  Recent Labs  08/23/13 0406 08/24/13 0448 08/25/13 0546  LABPROT 21.4* 24.0* 31.6*  INR 1.92* 2.23* 3.20*  CREATININE 0.69 0.72 0.70   Estimated Creatinine Clearance: 62.9 ml/min (by C-G formula based on Cr of 0.7).  Medical History: Past Medical History  Diagnosis Date  . Diabetes mellitus   . Hyperlipidemia   . Eosinophilia   . Cataract   . Hypertension   . Atrial fibrillation prior to 2008    moderate biatrial enlargement in 2009; mild to moderate LVH with a low-normal EF  . GERD (gastroesophageal reflux disease)     Hemorrhoids; history of peptic ulcer disease  . DJD (degenerative joint disease) of knee   . Chronic anticoagulation   . Aortic valve disease     2009: mild stenosis and regurgitation; peak gradient of 30-35 mmHg , subsequent AVR at [facility]  . Incontinence     Mild  . Hip fracture, left 12/12/2012  . Foot injury 06/25/2012  . Degenerative joint disease of knee 01/29/2008    Bilateral    . BREAST CANCER, HX OF 01/29/2008    Lumpectomy, radiation therapy  Arimidex stopped in July 2013    . Syncope and collapse 11/11/2012  . Diverticulosis   . Umbilical hernia   . Cholelithiasis   . DDD (degenerative disc disease), lumbar   . Vertebral compression fracture     lumbar  . Chronic pain   . Paresthesias     feet  . Gastroparesis   . Sleep apnea     CPAP  . Carcinoma of breast     Right mastectomy; radiation and hormonal therapy   Medications:  Prescriptions prior to admission   Medication Sig Dispense Refill  . clotrimazole-betamethasone (LOTRISONE) cream Apply 1 application topically 2 (two) times daily.      . diclofenac sodium (VOLTAREN) 1 % GEL Apply 1 application topically daily as needed (for knee pain).      Marland Kitchen glycerin adult (GLYCERIN ADULT) 2 G SUPP Place 1 suppository rectally once.  20 each  2  . HYDROcodone-acetaminophen (NORCO/VICODIN) 5-325 MG per tablet Take 1 tablet by mouth at bedtime as needed for pain.      . Multiple Vitamins-Minerals (CENTRUM SILVER ULTRA WOMENS PO) Take 1 tablet by mouth daily.       Marland Kitchen omeprazole (PRILOSEC) 20 MG capsule Take 1 capsule (20 mg total) by mouth 2 (two) times daily.  60 capsule  5  . simvastatin (ZOCOR) 20 MG tablet Take 1 tablet (20 mg total) by mouth at bedtime.  30 tablet  6  . sodium chloride 1 G tablet Take 1 tablet (1 g total) by mouth 2 (two) times daily.  60 tablet  0  . warfarin (COUMADIN) 5 MG tablet Take 5 mg by mouth daily at 6 PM. Has taken 10 mg (2 tablets) for the past 3 nights, Home Health Care was supposed to come out today AND CHECK PATIENTS  INR .  Normally, she takes 1  tablet (5mg ) on Mondays, Wednesdays and  Fridays. She normally takes 1 1/2 tablets (7.5mg ) on Tuesdays, Thursdays, Saturdays and Sundays      . warfarin (COUMADIN) 5 MG tablet take as directed      . acetaminophen (TYLENOL) 650 MG CR tablet Take 1 tablet (650 mg total) by mouth every 8 (eight) hours as needed for pain.      Marland Kitchen albuterol (PROVENTIL HFA;VENTOLIN HFA) 108 (90 BASE) MCG/ACT inhaler Inhale 2 puffs into the lungs every 4 (four) hours as needed for wheezing.  1 Inhaler  2  . albuterol (PROVENTIL) (2.5 MG/3ML) 0.083% nebulizer solution Take 3 mLs (2.5 mg total) by nebulization every 6 (six) hours as needed for wheezing.  75 mL  3   Assessment: 79 yo F on chronic warfarin for hx Afib and aortic valve replacement.   Dose recently increased to 10mg  daily on 11/17 (from 5-7.5mg  daily).  INR has trended up to supratherapeutic  level.  Goal of Therapy:  INR 2-3 (per outpatient treatment plan goal)   Plan:  HOLD coumadin today Continue to follow daily INR. Stop Lovenox.  Kristin Logan A 08/25/2013,8:36 AM

## 2013-08-25 NOTE — Progress Notes (Signed)
Subjective: F/U for hyponatremia. Pt is s/p Conivaptan therapy with a good response improving sodium level from 118 to 138 over 72 hours. Pt is out of ICU doing much better.   Objective: Looks much better , eating well. Off of Conivaptan therapy x 48 hours. Vital signs in last 24 hours: Filed Vitals:   08/24/13 0502 08/24/13 1458 08/24/13 2219 08/25/13 0617  BP: 187/103 167/90 197/88 155/73  Pulse: 81 69 80 66  Temp: 98.1 F (36.7 C) 97.5 F (36.4 C) 98.1 F (36.7 C) 98 F (36.7 C)  TempSrc:      Resp: 20 20 20 18   Height:      Weight:    89.3 kg (196 lb 13.9 oz)  SpO2: 97% 96% 93% 99%    Intake/Output Summary (Last 24 hours) at 08/25/13 0818 Last data filed at 08/25/13 0500  Gross per 24 hour  Intake    720 ml  Output   2000 ml  Net  -1280 ml     GEN: no acute distress,  family at bed side. She is oriented to TPP , eating breakfast. HEENT: Moist mucus memberanes. no goiter, no JVD  Chest: CLTAB  CVS: distant heart sounds, no murmur, no gallop.  Abd: soft, non tender, positive bowel sounds.  Ext: no edema  Skin: poor turgor  CNS: moves all extremities, non focal sensory and motor exam.   Lab Results: Basic Metabolic Panel:  Recent Labs  98/11/91 0448  08/24/13 1924 08/25/13 0546  NA 127*  < > 133* 138  K 4.2  --   --  4.3  CL 91*  --   --  100  CO2 27  --   --  30  GLUCOSE 84  --   --  78  BUN 9  --   --  11  CREATININE 0.72  --   --  0.70  CALCIUM 9.8  --   --  9.4  < > = values in this interval not displayed. CBG:  Recent Labs  08/23/13 1701 08/23/13 2116 08/24/13 0745 08/24/13 1203 08/24/13 1702 08/25/13 0729  GLUCAP 123* 134* 88 132* 184* 77    Studies/Results: Ct Chest W Contrast  08/23/2013   CLINICAL DATA:  Revaluation of small ground-glass density left upper lobe.  EXAM: CT CHEST WITH CONTRAST  TECHNIQUE: Multidetector CT imaging of the chest was performed during intravenous contrast administration.  CONTRAST:  80mL OMNIPAQUE IOHEXOL  300 MG/ML  SOLN  COMPARISON:  01/15/2013  FINDINGS: The thoracic inlet is unremarkable.  Multichamber cardiac enlargement is identified. Coarse coronary artery vascular calcifications are identified. The left sinus a central venous catheter is appreciated with tip projecting in the region of the superior vena cava right atrial junction. There is no evidence of mediastinal masses nor adenopathy.  Appears described ground-glass nodule within the left lung apex is not appreciated on the present study. No further pulmonary nodules or masses are identified. Discoid atelectasis, minimal, is identified within the posterior aspect of the base of the right middle lobe.  The visualized upper abdominal viscera demonstrate no gross abnormalities.  IMPRESSION: 1. Interval resolution of the ground-glass nodule described within the left lung apex. 2. Cardiomegaly 3. No evidence of focal acute abnormalities.   Electronically Signed   By: Salome Holmes M.D.   On: 08/23/2013 16:30     Medications:  Scheduled Meds: . clotrimazole   Topical BID  . insulin aspart  0-15 Units Subcutaneous TID WC  . levothyroxine  75  mcg Oral QAC breakfast  . pantoprazole  40 mg Oral Daily  . predniSONE  15 mg Oral Q breakfast  . simvastatin  20 mg Oral QHS  . sodium chloride  10-40 mL Intracatheter Q12H  . sodium chloride  3 mL Intravenous Q12H  . Warfarin - Pharmacist Dosing Inpatient   Does not apply Q24H   Continuous Infusions:  PRN Meds:.acetaminophen, acetaminophen, albuterol, albuterol, alum & mag hydroxide-simeth, bisacodyl, ondansetron (ZOFRAN) IV, ondansetron, sodium chloride, traZODone  Assessment/Plan: Principal Problem:   Hyponatremia Active Problems:   DIABETES MELLITUS, TYPE II   HYPERTENSION   Atrial fibrillation, chronic   GERD   Obesity   Hypothyroidism   Weakness   Bradycardia   Hypokalemia   Adrenal insufficiency   Plan: She did very well s/p Conivaptan therapy for 24 hours only.  Her sodium level  is holding well at 138 off of therapy for 48 hours, improved from 118 to 138 over 72 hours. She will not need Tolvaptan f/u.  She is also responding to the adrenal and thyroid hormone supplements as well. Her Chest CT is negative for any mass lesions, and infact the ground glass 7mm pulmonary nodule has resolved in the interim. From Endocrinology point of view , she can be discharged . i will see her in office in a week with repeat electolytes. Discharge on Prednisone 15 mg po qam,a nd levothyroxine 75 mcg po qam. Thank you for involving me in her care.   LOS: 4 days   Jennifer Payes 08/25/2013, 8:18 AM

## 2013-08-26 ENCOUNTER — Inpatient Hospital Stay (HOSPITAL_COMMUNITY)
Admission: EM | Admit: 2013-08-26 | Discharge: 2013-09-01 | DRG: 065 | Disposition: A | Discharge status: Skilled Nursing Facility | Payer: Medicare Other | Attending: Neurology | Admitting: Neurology

## 2013-08-26 ENCOUNTER — Inpatient Hospital Stay (HOSPITAL_COMMUNITY): Payer: Medicare Other

## 2013-08-26 ENCOUNTER — Encounter (HOSPITAL_COMMUNITY): Payer: Self-pay | Admitting: Emergency Medicine

## 2013-08-26 ENCOUNTER — Emergency Department (HOSPITAL_COMMUNITY): Payer: Medicare Other

## 2013-08-26 DIAGNOSIS — M1710 Unilateral primary osteoarthritis, unspecified knee: Secondary | ICD-10-CM | POA: Diagnosis present

## 2013-08-26 DIAGNOSIS — I4891 Unspecified atrial fibrillation: Secondary | ICD-10-CM | POA: Diagnosis present

## 2013-08-26 DIAGNOSIS — W19XXXA Unspecified fall, initial encounter: Secondary | ICD-10-CM | POA: Diagnosis present

## 2013-08-26 DIAGNOSIS — I482 Chronic atrial fibrillation, unspecified: Secondary | ICD-10-CM | POA: Diagnosis present

## 2013-08-26 DIAGNOSIS — I629 Nontraumatic intracranial hemorrhage, unspecified: Principal | ICD-10-CM | POA: Diagnosis present

## 2013-08-26 DIAGNOSIS — T45515A Adverse effect of anticoagulants, initial encounter: Secondary | ICD-10-CM | POA: Diagnosis present

## 2013-08-26 DIAGNOSIS — G8190 Hemiplegia, unspecified affecting unspecified side: Secondary | ICD-10-CM | POA: Diagnosis present

## 2013-08-26 DIAGNOSIS — I619 Nontraumatic intracerebral hemorrhage, unspecified: Secondary | ICD-10-CM

## 2013-08-26 DIAGNOSIS — E119 Type 2 diabetes mellitus without complications: Secondary | ICD-10-CM | POA: Diagnosis present

## 2013-08-26 DIAGNOSIS — Z823 Family history of stroke: Secondary | ICD-10-CM

## 2013-08-26 DIAGNOSIS — E785 Hyperlipidemia, unspecified: Secondary | ICD-10-CM | POA: Diagnosis present

## 2013-08-26 DIAGNOSIS — I671 Cerebral aneurysm, nonruptured: Secondary | ICD-10-CM | POA: Diagnosis present

## 2013-08-26 DIAGNOSIS — D649 Anemia, unspecified: Secondary | ICD-10-CM | POA: Diagnosis present

## 2013-08-26 DIAGNOSIS — G4730 Sleep apnea, unspecified: Secondary | ICD-10-CM | POA: Diagnosis present

## 2013-08-26 DIAGNOSIS — Z7901 Long term (current) use of anticoagulants: Secondary | ICD-10-CM

## 2013-08-26 DIAGNOSIS — Z79899 Other long term (current) drug therapy: Secondary | ICD-10-CM

## 2013-08-26 DIAGNOSIS — I359 Nonrheumatic aortic valve disorder, unspecified: Secondary | ICD-10-CM | POA: Diagnosis present

## 2013-08-26 DIAGNOSIS — K219 Gastro-esophageal reflux disease without esophagitis: Secondary | ICD-10-CM | POA: Diagnosis present

## 2013-08-26 DIAGNOSIS — Z8249 Family history of ischemic heart disease and other diseases of the circulatory system: Secondary | ICD-10-CM

## 2013-08-26 DIAGNOSIS — I10 Essential (primary) hypertension: Secondary | ICD-10-CM | POA: Diagnosis present

## 2013-08-26 DIAGNOSIS — M79609 Pain in unspecified limb: Secondary | ICD-10-CM | POA: Diagnosis present

## 2013-08-26 LAB — COMPREHENSIVE METABOLIC PANEL
Albumin: 4.2 g/dL (ref 3.5–5.2)
Alkaline Phosphatase: 68 U/L (ref 39–117)
BUN: 15 mg/dL (ref 6–23)
Calcium: 9.8 mg/dL (ref 8.4–10.5)
Chloride: 98 mEq/L (ref 96–112)
Creatinine, Ser: 0.89 mg/dL (ref 0.50–1.10)
GFR calc Af Amer: 70 mL/min — ABNORMAL LOW (ref >90)
Glucose, Bld: 155 mg/dL — ABNORMAL HIGH (ref 70–99)
Total Bilirubin: 0.3 mg/dL (ref 0.3–1.2)
Total Protein: 7.9 g/dL (ref 6.0–8.3)

## 2013-08-26 LAB — HEMOGLOBIN A1C: Mean Plasma Glucose: 114 mg/dL (ref <117)

## 2013-08-26 LAB — GLUCOSE, CAPILLARY
Glucose-Capillary: 137 mg/dL — ABNORMAL HIGH (ref 70–99)
Glucose-Capillary: 83 mg/dL (ref 70–99)
Glucose-Capillary: 100 mg/dL — ABNORMAL HIGH (ref 70–99)
Glucose-Capillary: 100 mg/dL — ABNORMAL HIGH (ref 70–99)
Glucose-Capillary: 117 mg/dL — ABNORMAL HIGH (ref 70–99)

## 2013-08-26 LAB — CBC
HCT: 36.8 % (ref 36.0–46.0)
Hemoglobin: 12.6 g/dL (ref 12.0–15.0)
MCH: 30.3 pg (ref 26.0–34.0)
MCHC: 34.2 g/dL (ref 30.0–36.0)
RBC: 4.16 MIL/uL (ref 3.87–5.11)
RDW: 15.1 % (ref 11.5–15.5)
WBC: 8.8 10*3/uL (ref 4.0–10.5)

## 2013-08-26 LAB — DIFFERENTIAL
Basophils Relative: 0 % (ref 0–1)
Eosinophils Absolute: 0 10*3/uL (ref 0.0–0.7)
Lymphs Abs: 2.1 10*3/uL (ref 0.7–4.0)
Monocytes Absolute: 0.7 10*3/uL (ref 0.1–1.0)
Monocytes Relative: 8 % (ref 3–12)
Neutro Abs: 5.9 10*3/uL (ref 1.7–7.7)

## 2013-08-26 LAB — BASIC METABOLIC PANEL
BUN: 10 mg/dL (ref 6–23)
Creatinine, Ser: 0.7 mg/dL (ref 0.50–1.10)
GFR calc Af Amer: >90 mL/min (ref >90)
GFR calc non Af Amer: 80 mL/min — ABNORMAL LOW (ref >90)
Glucose, Bld: 114 mg/dL — ABNORMAL HIGH (ref 70–99)
Potassium: 3.6 mEq/L (ref 3.5–5.1)

## 2013-08-26 LAB — PROTIME-INR
Prothrombin Time: 14.8 seconds (ref 11.6–15.2)
Prothrombin Time: 13.6 seconds (ref 11.6–15.2)
Prothrombin Time: 13.7 seconds (ref 11.6–15.2)

## 2013-08-26 LAB — TROPONIN I: Troponin I: <0.3 ng/mL (ref <0.30)

## 2013-08-26 LAB — POCT I-STAT TROPONIN I: Troponin i, poc: 0 ng/mL (ref 0.00–0.08)

## 2013-08-26 LAB — TYPE AND SCREEN
ABO/RH(D): O NEG
Antibody Screen: NEGATIVE

## 2013-08-26 LAB — MRSA PCR SCREENING: MRSA by PCR: NEGATIVE

## 2013-08-26 LAB — APTT: aPTT: 43 seconds — ABNORMAL HIGH (ref 24–37)

## 2013-08-26 MED ORDER — MORPHINE SULFATE 2 MG/ML IJ SOLN
INTRAMUSCULAR | Status: AC
  Filled 2013-08-26: qty 1

## 2013-08-26 MED ORDER — VITAMIN K1 10 MG/ML IJ SOLN
10.0000 mg | INTRAVENOUS | Status: AC
  Administered 2013-08-26: 10 mg via INTRAVENOUS
  Filled 2013-08-26: qty 1

## 2013-08-26 MED ORDER — SODIUM CHLORIDE 0.9 % IV SOLN
INTRAVENOUS | Status: AC
  Administered 2013-08-26: 20 mL/h via INTRAVENOUS

## 2013-08-26 MED ORDER — ACETAMINOPHEN 650 MG RE SUPP: 650.0000 mg | RECTAL | Status: DC | PRN

## 2013-08-26 MED ORDER — ACETAMINOPHEN 325 MG PO TABS
650.0000 mg | ORAL_TABLET | ORAL | Status: DC | PRN
  Administered 2013-08-26 – 2013-09-01 (×12): 650 mg via ORAL
  Filled 2013-08-26 (×13): qty 2

## 2013-08-26 MED ORDER — SENNOSIDES-DOCUSATE SODIUM 8.6-50 MG PO TABS
1.0000 | ORAL_TABLET | Freq: Two times a day (BID) | ORAL | Status: DC
  Administered 2013-08-26 – 2013-09-01 (×11): 1 via ORAL
  Filled 2013-08-26 (×12): qty 1

## 2013-08-26 MED ORDER — LABETALOL HCL 5 MG/ML IV SOLN
10.0000 mg | INTRAVENOUS | Status: DC | PRN
  Administered 2013-08-26: 10 mg via INTRAVENOUS
  Administered 2013-08-29: 20 mg via INTRAVENOUS
  Filled 2013-08-26 (×2): qty 4

## 2013-08-26 MED ORDER — PROTHROMBIN COMPLEX CONC HUMAN 500 UNITS IV KIT
2214.0000 [IU] | PACK | Status: AC
  Administered 2013-08-26: 2214 [IU] via INTRAVENOUS
  Filled 2013-08-26: qty 89

## 2013-08-26 MED ORDER — VITAMIN K1 10 MG/ML IJ SOLN
INTRAMUSCULAR | Status: AC
  Filled 2013-08-26: qty 1

## 2013-08-26 MED ORDER — INSULIN ASPART 100 UNIT/ML ~~LOC~~ SOLN
0.0000 [IU] | Freq: Three times a day (TID) | SUBCUTANEOUS | Status: DC
  Administered 2013-08-27 – 2013-08-31 (×5): 2 [IU] via SUBCUTANEOUS

## 2013-08-26 MED ORDER — PREDNISONE 5 MG PO TABS
15.0000 mg | ORAL_TABLET | Freq: Every day | ORAL | Status: DC
  Administered 2013-08-27 – 2013-09-01 (×6): 15 mg via ORAL
  Filled 2013-08-26 (×8): qty 1

## 2013-08-26 MED ORDER — PANTOPRAZOLE SODIUM 40 MG IV SOLR
40.0000 mg | Freq: Every day | INTRAVENOUS | Status: DC
  Administered 2013-08-26 – 2013-08-27 (×2): 40 mg via INTRAVENOUS
  Filled 2013-08-26 (×3): qty 40

## 2013-08-26 MED ORDER — MORPHINE SULFATE 2 MG/ML IJ SOLN: 1.0000 mg | Freq: Once | INTRAMUSCULAR | Status: DC

## 2013-08-26 MED ORDER — LEVOTHYROXINE SODIUM 75 MCG PO TABS
75.0000 ug | ORAL_TABLET | Freq: Every day | ORAL | Status: DC
  Administered 2013-08-27 – 2013-09-01 (×6): 75 ug via ORAL
  Filled 2013-08-26 (×8): qty 1

## 2013-08-26 NOTE — Evaluation (Signed)
Clinical/Bedside Swallow Evaluation Patient Details  Name: EMELYNN RANCE MRN: [id number] Date of Birth: Feb 18, 1932  Today's Date: 08/26/2013 Time:  -     Past Medical History:  Past Medical History  Diagnosis Date  . Diabetes mellitus   . Hyperlipidemia   . Eosinophilia   . Cataract   . Hypertension   . Atrial fibrillation prior to 2008    moderate biatrial enlargement in 2009; mild to moderate LVH with a low-normal EF  . GERD (gastroesophageal reflux disease)     Hemorrhoids; history of peptic ulcer disease  . DJD (degenerative joint disease) of knee   . Chronic anticoagulation   . Aortic valve disease     2009: mild stenosis and regurgitation; peak gradient of 30-35 mmHg , subsequent AVR at [facility]  . Incontinence     Mild  . Hip fracture, left 12/12/2012  . Foot injury 06/25/2012  . Degenerative joint disease of knee 01/29/2008    Bilateral    . BREAST CANCER, HX OF 01/29/2008    Lumpectomy, radiation therapy  Arimidex stopped in July 2013    . Syncope and collapse 11/11/2012  . Diverticulosis   . Umbilical hernia   . Cholelithiasis   . DDD (degenerative disc disease), lumbar   . Vertebral compression fracture     lumbar  . Chronic pain   . Paresthesias     feet  . Gastroparesis   . Sleep apnea     CPAP  . Carcinoma of breast     Right mastectomy; radiation and hormonal therapy   Past Surgical History:  Past Surgical History  Procedure Laterality Date  . Exploration post operative open heart    . Colonoscopy      Approximately 2000  . Cardiac valve replacement      [facility]  . Cardiac valve replacement    . Breast surgery    . Breast lumpectomy    . Esophagogastroduodenoscopy N/A 02/20/2013    Procedure: ESOPHAGOGASTRODUODENOSCOPY (EGD);  Surgeon: Malissa Hippo, MD;  Location: [facility];  Service: Endoscopy;  Laterality: N/A;  . Mastectomy     HPI:  Ms. ARTIA SINGLEY is a 79 y.o. female presenting with acute delirium, then fell with left  hemiparesis. Imaging confirms a intraparenchymal hematoma in the right basal ganglia/to Lamb 8 region with intraventricular hemorrhage bilaterally. Infarct felt to be secondary to anticoagulation. Patient had fall; however, it is unclear if she had left sided weakness prior to fall or after fall. Patient has been on coumadin for atrial fibrillation.  On coumadin prior to admission. Now on no anticoagulants for secondary stroke prevention. Patient with resultant left hemiparesis, improved, however remains confused.    Assessment / Plan / Recommendation Clinical Impression  Pt demonstrates swallow function WNL. Recommend soft diet due to missing dentition and pts preference. No SLP f/u needed, will sign off for swallow, f/u tomorrow for cognitive linguistic eval.     Aspiration Risk  Mild    Diet Recommendation Dysphagia 3 (Mechanical Soft);Thin liquid   Liquid Administration via: Cup;Straw Medication Administration: Whole meds with liquid Supervision: Patient able to self feed Postural Changes and/or Swallow Maneuvers: Seated upright 90 degrees    Other  Recommendations Oral Care Recommendations: Oral care BID   Follow Up Recommendations  None    Frequency and Duration        Pertinent Vitals/Pain NA    SLP Swallow Goals     Swallow Study Prior Functional Status  General HPI: Ms. CATRICIA SCHEERER is a 79 y.o. female presenting with acute delirium, then fell with left hemiparesis. Imaging confirms a intraparenchymal hematoma in the right basal ganglia/to Lamb 8 region with intraventricular hemorrhage bilaterally. Infarct felt to be secondary to anticoagulation. Patient had fall; however, it is unclear if she had left sided weakness prior to fall or after fall. Patient has been on coumadin for atrial fibrillation.  On coumadin prior to admission. Now on no anticoagulants for secondary stroke prevention. Patient with resultant left hemiparesis, improved, however remains confused.   Type of Study: Bedside swallow evaluation Diet Prior to this Study: NPO Temperature Spikes Noted: No Respiratory Status: Room air History of Recent Intubation: No Behavior/Cognition: Alert;Cooperative;Pleasant mood Oral Cavity - Dentition: Edentulous;Dentures, not available Self-Feeding Abilities: Able to feed self Patient Positioning: Upright in bed Baseline Vocal Quality: Clear Volitional Cough: Strong Volitional Swallow: Able to elicit    Oral/Motor/Sensory Function Overall Oral Motor/Sensory Function: Appears within functional limits for tasks assessed   Ice Chips Ice chips: Within functional limits   Thin Liquid Thin Liquid: Within functional limits Presentation: Cup;Straw;Self Fed    Nectar Thick Nectar Thick Liquid: Not tested   Honey Thick Honey Thick Liquid: Not tested   Puree Puree: Within functional limits Presentation: Self Fed;Spoon   Solid   GO    Solid: Within functional limits Presentation: Self Fed      Harlon Ditty, MA CCC-SLP [phone]  Song Myre, Riley Nearing 08/26/2013,10:47 AM

## 2013-08-26 NOTE — Progress Notes (Signed)
Stroke Team Progress Note  HISTORY Kristin Logan is an 79 y.o. female who was just discharged from the hospital ib 08/25/2013 after being admitted for hyponatremia. Patient had been fine all afternoon and went to bed at about 2100. At about 2230 the patient was up to go to the bathroom and seemed to have some confusion but was ambulatory. At about 0130 the patient was found on the floor after falling backward. It seemed she had fallen on her left. She was not aware of what was going on. She had urinated on herself. She Also seemed to have some left sided weakness. Patient remained confused. Patient was brought in for evaluation.   She has afib. She is on coumadin. Just discharged on 08/25/2013. INR: 3.20, 2.80 on admit.  Date last known well: Date: 08/25/2013  Time last known well: Time: 21:00  tPA Given: No: ICH   Patient was not a TPA candidate secondary to ICH. She was admitted to the neuro ICU for further evaluation and treatment.  SUBJECTIVE Her family is at the bedside.  Overall she feels her condition is disoriented to age and date/year, but is able to follow simple commands.   OBJECTIVE Most recent Vital Signs: Filed Vitals:   08/26/13 0600 08/26/13 0615 08/26/13 0630 08/26/13 0645  BP:  153/62 153/67   Pulse: 88 69 39 65  Temp:  98.7 F (37.1 C)    TempSrc:  Oral    Resp: 16 17 12 13   Height:  5\' 5"  (1.651 m)    Weight:  84 kg (185 lb 3 oz)    SpO2: 99% 100% 100% 100%   CBG (last 3)   Recent Labs  08/25/13 0729 08/25/13 1112 08/26/13 0322  GLUCAP 77 98 137*    IV Fluid Intake:     MEDICATIONS  . sodium chloride   Intravenous STAT  . insulin aspart  0-15 Units Subcutaneous TID WC  . levothyroxine  75 mcg Oral QAC breakfast  . pantoprazole (PROTONIX) IV  40 mg Intravenous QHS  . predniSONE  15 mg Oral Q breakfast  . senna-docusate  1 tablet Oral BID   PRN:  acetaminophen, acetaminophen, labetalol  Diet:  NPO   Activity:  Bedrest DVT Prophylaxis:   SCD  CLINICALLY SIGNIFICANT STUDIES Basic Metabolic Panel:  Recent Labs Lab 08/21/13 1343  08/25/13 0546 08/26/13 0319  NA 118*  < > 138 137  K 2.8*  < > 4.3 3.5  CL 81*  < > 100 98  CO2 28  < > 30 27  GLUCOSE 109*  < > 78 155*  BUN 5*  < > 11 15  CREATININE 0.67  < > 0.70 0.89  CALCIUM 9.0  < > 9.4 9.8  MG 2.1  --   --   --   PHOS 3.5  --   --   --   < > = values in this interval not displayed. Liver Function Tests:  Recent Labs Lab 08/22/13 0458 08/26/13 0319  AST 26 31  ALT 10 16  ALKPHOS 65 68  BILITOT 0.4 0.3  PROT 6.8 7.9  ALBUMIN 3.5 4.2   CBC:  Recent Labs Lab 08/21/13 1343 08/22/13 0458 08/26/13 0319  WBC 6.3 8.5 8.8  NEUTROABS 2.7  --  5.9  HGB 12.8 12.8 12.6  HCT 34.9* 35.8* 36.8  MCV 81.7 82.9 88.5  PLT 232 244 266   Coagulation:  Recent Labs Lab 08/23/13 0406 08/24/13 0448 08/25/13 0546 08/26/13 0319  LABPROT  21.4* 24.0* 31.6* 28.5*  INR 1.92* 2.23* 3.20* 2.80*   Cardiac Enzymes:  Recent Labs Lab 08/26/13 0319  TROPONINI <0.30   Urinalysis:  Recent Labs Lab 08/22/13 0801  COLORURINE YELLOW  LABSPEC <1.005*  PHURINE 6.0  GLUCOSEU NEGATIVE  HGBUR NEGATIVE  BILIRUBINUR NEGATIVE  KETONESUR NEGATIVE  PROTEINUR NEGATIVE  UROBILINOGEN 0.2  NITRITE NEGATIVE  LEUKOCYTESUR NEGATIVE   Lipid Panel    Component Value Date/Time   CHOL 122 10/15/2012 1406   TRIG 189* 10/15/2012 1406   HDL 34* 10/15/2012 1406   CHOLHDL 3.6 10/15/2012 1406   VLDL 38 10/15/2012 1406   LDLCALC 50 10/15/2012 1406   HgbA1C  Lab Results  Component Value Date   HGBA1C 5.7* 07/24/2013    Urine Drug Screen:   No results found for this basename: labopia, cocainscrnur, labbenz, amphetmu, thcu, labbarb    Alcohol Level: No results found for this basename: ETH,  in the last 168 hours  Ct Head (brain) Wo Contrast 08/26/2013    Intraparenchymal hematoma in the right basal ganglia/to Lamb 8 region with intraventricular hemorrhage bilaterally. No significant  mass effect or midline shift. 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 The pattern bleeding does not appear to represent hemorrhage due to the aneurysm.    MRI of the brain    MRA of the brain    2D Echocardiogram    Carotid Doppler    CXR    EKG  .   Therapy Recommendations   Physical Exam   HEENT- Normocephalic, no lesions, without obvious abnormality. Normal external eye and conjunctiva.  Neck supple with no masses, nodes, nodules or enlargement.  Cardiovascular - S1, S2 normal  Lungs - chest clear, no wheezing, rales, normal symmetric air entry  Abdomen - soft, non-tender; bowel sounds normal; no masses, no organomegaly  Extremities - left ankle swelling and bruising. Moderate right ankle edema  Neurologic Examination:  Mental Status:  AO x 3 normal speech and language Cranial Nerves:  II: Discs flat bilaterally; Visual fields does   blink to threat, pupils equal, round, reactive to light and accommodation left gaze deviation.   III,IV, VI: ptosis not present, extra-ocular motions intact bilaterally  V,VII: left facial droop, facial light touch sensation normal bilaterally  VIII: hearing unable to test IX,X: gag reflex reduced  XI: bilateral shoulder shrug  XII: midline tongue extension  Motor:  Right : Upper extremity 5/5 Left: Upper extremity 4-/5  Lower extremity 5/5 Lower extremity 4-/5  Tone and bulk:normal tone throughout; no atrophy noted  Sensory: Pinprick and light touch intact throughout, bilaterally  Deep Tendon Reflexes: 2+ and symmetric with absent AJ's bilaterally  Plantars:  Right: mute Left: mute  Cerebellar:  normal finger-to-nose and normal heel-to-shin test  Gait: Unable to test  CV: pulses palpable throughout    ASSESSMENT Ms. Kristin Logan is a 79 y.o. female presenting with acute delirium, then fell with left hemiparesis. Imaging confirms a intraparenchymal hematoma in the right basal ganglia/to Lamb 8 region  with intraventricular hemorrhage bilaterally. Infarct felt to be secondary to anticoagulation. Patient had fall; however, it is unclear if she had left sided weakness prior to fall or after fall. Patient has been on coumadin for atrial fibrillation.  On coumadin prior to admission. Now on no anticoagulants for secondary stroke prevention. Patient with resultant left hemiparesis, improved, however remains confused. Work up underway.   Atrial fibrillation  Hypertension  Diabetes mellitus   Hyperlipidemia  Hospital day # 0  TREATMENT/PLAN  Continue no antithrombotic for secondary stroke prevention.  Reversal protocol:  Vitamin K infused. Pharmacy assisting with reversal protocol. Follow up INR.  Blood pressure goal 160/90  Await INR.   If neurological worsening, neurosurgical consult.  Gwendolyn Lima. Manson Passey, [facility], MBA, MHA [facility] Pager: [phone] 08/26/2013 9:09 AM  This patient is critically ill and at significant risk of neurological worsening, death and care requires constant monitoring of vital signs, hemodynamics,respiratory and cardiac monitoring,review of multiple databases, neurological assessment, discussion with family, other specialists and medical decision making of high complexity. I spent 30 minutes of neurocritical care time  in the care of  this patient. I have personally obtained a history, examined the patient, evaluated imaging results, and formulated the assessment and plan of care. I agree with the above. Delia Heady, MD

## 2013-08-26 NOTE — ED Notes (Signed)
Pr arrives with family. Pt was discharge yesterday from hospital, treated for low sodium. Pt had PICC line while in hospital, PICC was removed.  Family states pt has been confused ask inappropriate questions. Family heard pt fall tonight, found pt laying in floor in bedroom.

## 2013-08-26 NOTE — ED Notes (Signed)
Pt is weaker on left side, family noticed facial drooping on left some yesterday, worse today. MD at the bedside.

## 2013-08-26 NOTE — H&P (Addendum)
Admission H&P    Chief Complaint: Confusion, left sided weakness HPI: Kristin Logan is an 79 y.o. female who was just discharged from the hospital ib 08/25/2013 after being admitted for hyponatremia.  Patient had been fine all afternoon and went to bed at about 2100.  At about 2230 the patient was up to go to the bathroom and seemed to have some confusion but was ambulatory.  At about 0130 the patient was found on the floor after falling backward.  It seemed she had fallen on her left.  She was not aware of what was going on.  She had urinated on herself.  She  Also seemed to have some left sided weakness.  Patient remained confused.  Patient was brought in for evaluation.    Date last known well: Date: 08/25/2013 Time last known well: Time: 21:00 tPA Given: No: ICH  Past Medical History  Diagnosis Date  . Diabetes mellitus   . Hyperlipidemia   . Eosinophilia   . Cataract   . Hypertension   . Atrial fibrillation prior to 2008    moderate biatrial enlargement in 2009; mild to moderate LVH with a low-normal EF  . GERD (gastroesophageal reflux disease)     Hemorrhoids; history of peptic ulcer disease  . DJD (degenerative joint disease) of knee   . Chronic anticoagulation   . Aortic valve disease     2009: mild stenosis and regurgitation; peak gradient of 30-35 mmHg , subsequent AVR at [facility]  . Incontinence     Mild  . Hip fracture, left 12/12/2012  . Foot injury 06/25/2012  . Degenerative joint disease of knee 01/29/2008    Bilateral    . BREAST CANCER, HX OF 01/29/2008    Lumpectomy, radiation therapy  Arimidex stopped in July 2013    . Syncope and collapse 11/11/2012  . Diverticulosis   . Umbilical hernia   . Cholelithiasis   . DDD (degenerative disc disease), lumbar   . Vertebral compression fracture     lumbar  . Chronic pain   . Paresthesias     feet  . Gastroparesis   . Sleep apnea     CPAP  . Carcinoma of breast     Right mastectomy; radiation and hormonal therapy     Past Surgical History  Procedure Laterality Date  . Exploration post operative open heart    . Colonoscopy      Approximately 2000  . Cardiac valve replacement      [facility]  . Cardiac valve replacement    . Breast surgery    . Breast lumpectomy    . Esophagogastroduodenoscopy N/A 02/20/2013    Procedure: ESOPHAGOGASTRODUODENOSCOPY (EGD);  Surgeon: Malissa Hippo, MD;  Location: [facility];  Service: Endoscopy;  Laterality: N/A;  . Mastectomy      Family History  Problem Relation Age of Onset  . Heart disease Mother   . Stroke Father   . Cancer Sister   . Heart disease Sister   . Stroke Sister   . Stroke Brother    Social History:  reports that she has never smoked. She does not have any smokeless tobacco history on file. She reports that she does not drink alcohol or use illicit drugs.  Allergies:  Allergies  Allergen Reactions  . Nsaids     Bleeding ulcer  . Codeine Nausea And Vomiting  . Adhesive [Tape] Rash    Redness, and peeling skin off.     Medications Prior to Admission  Medication Sig Dispense Refill  . acetaminophen (TYLENOL) 650 MG CR tablet Take 1 tablet (650 mg total) by mouth every 8 (eight) hours as needed for pain.      Marland Kitchen albuterol (PROVENTIL HFA;VENTOLIN HFA) 108 (90 BASE) MCG/ACT inhaler Inhale 2 puffs into the lungs every 4 (four) hours as needed for wheezing.  1 Inhaler  2  . albuterol (PROVENTIL) (2.5 MG/3ML) 0.083% nebulizer solution Take 3 mLs (2.5 mg total) by nebulization every 6 (six) hours as needed for wheezing.  75 mL  3  . amLODipine (NORVASC) 5 MG tablet Take 1 tablet (5 mg total) by mouth daily.  20 tablet  1  . clotrimazole-betamethasone (LOTRISONE) cream Apply 1 application topically 2 (two) times daily.      . diclofenac sodium (VOLTAREN) 1 % GEL Apply 1 application topically daily as needed (for knee pain).      Marland Kitchen glycerin adult (GLYCERIN ADULT) 2 G SUPP Place 1 suppository rectally once.  20 each  2  . HYDROcodone-acetaminophen  (NORCO/VICODIN) 5-325 MG per tablet Take 1 tablet by mouth at bedtime as needed for pain.      Marland Kitchen levothyroxine (SYNTHROID, LEVOTHROID) 75 MCG tablet Take 1 tablet (75 mcg total) by mouth daily before breakfast.  30 tablet  1  . Multiple Vitamins-Minerals (CENTRUM SILVER ULTRA WOMENS PO) Take 1 tablet by mouth daily.       Marland Kitchen omeprazole (PRILOSEC) 20 MG capsule Take 1 capsule (20 mg total) by mouth 2 (two) times daily.  60 capsule  5  . predniSONE (DELTASONE) 5 MG tablet Take 3 tablets (15 mg total) by mouth daily with breakfast.  15 tablet  1  . simvastatin (ZOCOR) 20 MG tablet Take 1 tablet (20 mg total) by mouth at bedtime.  30 tablet  6  . warfarin (COUMADIN) 5 MG tablet Take 5 mg by mouth daily at 6 PM. Has taken 10 mg (2 tablets) for the past 3 nights, Home Health Care was supposed to come out today AND CHECK PATIENTS  INR .  Normally, she takes 1 tablet (5mg ) on Mondays, Wednesdays and  Fridays. She normally takes 1 1/2 tablets (7.5mg ) on Tuesdays, Thursdays, Saturdays and Sundays      . warfarin (COUMADIN) 5 MG tablet take as directed        ROS: History obtained from the patient  General ROS: negative for - chills, fatigue, fever, night sweats, weight gain or weight loss Psychological ROS: negative for - behavioral disorder, hallucinations, memory difficulties, mood swings or suicidal ideation Ophthalmic ROS: negative for - blurry vision, double vision, eye pain or loss of vision ENT ROS: negative for - epistaxis, nasal discharge, oral lesions, sore throat, tinnitus or vertigo Allergy and Immunology ROS: negative for - hives or itchy/watery eyes Hematological and Lymphatic ROS: negative for - bleeding problems, bruising or swollen lymph nodes Endocrine ROS: negative for - galactorrhea, hair pattern changes, polydipsia/polyuria or temperature intolerance Respiratory ROS: negative for - cough, hemoptysis, shortness of breath or wheezing Cardiovascular ROS: negative for - chest pain,  dyspnea on exertion, edema or irregular heartbeat Gastrointestinal ROS: negative for - abdominal pain, diarrhea, hematemesis, nausea/vomiting or stool incontinence Genito-Urinary ROS: negative for - dysuria, hematuria, incontinence or urinary frequency/urgency Musculoskeletal ROS: left ankle pain and swelling Neurological ROS: as noted in HPI Dermatological ROS: negative for rash and skin lesion changes  Physical Examination: Blood pressure 153/62, pulse 69, temperature 98.7 F (37.1 C), temperature source Oral, resp. rate 17, height 5\' 5"  (1.651 m), weight  84 kg (185 lb 3 oz), SpO2 100.00%.  General Examination: HEENT-  Normocephalic, no lesions, without obvious abnormality.  Normal external eye and conjunctiva.  Normal TM's bilaterally.  Normal auditory canals and external ears. Normal external nose, mucus membranes and septum.  Normal pharynx. Neck supple with no masses, nodes, nodules or enlargement. Cardiovascular - S1, S2 normal Lungs - chest clear, no wheezing, rales, normal symmetric air entry Abdomen - soft, non-tender; bowel sounds normal; no masses,  no organomegaly Extremities - left ankle swelling and bruising.  Mild right ankle edema  Neurologic Examination: Mental Status: Alert.  Unable to tell why she is in the hospital and exactly where she is.  Does not recognize all of her family members.  Speech fluent without evidence of aphasia.  Able to follow 3 step commands without difficulty. Cranial Nerves: II: Discs flat bilaterally; Visual fields grossly normal, pupils equal, round, reactive to light and accommodation III,IV, VI: ptosis not present, extra-ocular motions intact bilaterally V,VII: left facial droop, facial light touch sensation normal bilaterally VIII: hearing normal bilaterally IX,X: gag reflex reduced XI: bilateral shoulder shrug XII: midline tongue extension Motor: Right : Upper extremity   5/5    Left:     Upper extremity   4-/5  Lower extremity    5/5     Lower extremity   4-/5 Tone and bulk:normal tone throughout; no atrophy noted Sensory: Pinprick and light touch intact throughout, bilaterally Deep Tendon Reflexes: 2+ and symmetric with absent AJ's bilaterally Plantars: Right: mute   Left: mute Cerebellar: normal finger-to-nose and normal heel-to-shin test Gait: Unable to test CV: pulses palpable throughout     Laboratory Studies:   Basic Metabolic Panel:  Recent Labs Lab 08/21/13 1343  08/22/13 0458  08/23/13 0406  08/24/13 0448  08/24/13 1157 08/24/13 1708 08/24/13 1924 08/25/13 0546 08/26/13 0319  NA 118*  < > 125*  < > 129*  < > 127*  < > 131* 130* 133* 138 137  K 2.8*  < > 4.3  --  4.5  --  4.2  --   --   --   --  4.3 3.5  CL 81*  < > 89*  --  91*  --  91*  --   --   --   --  100 98  CO2 28  < > 26  --  26  --  27  --   --   --   --  30 27  GLUCOSE 109*  < > 88  --  127*  --  84  --   --   --   --  78 155*  BUN 5*  < > 5*  --  6  --  9  --   --   --   --  11 15  CREATININE 0.67  < > 0.61  --  0.69  --  0.72  --   --   --   --  0.70 0.89  CALCIUM 9.0  < > 9.6  --  10.1  --  9.8  --   --   --   --  9.4 9.8  MG 2.1  --   --   --   --   --   --   --   --   --   --   --   --   PHOS 3.5  --   --   --   --   --   --   --   --   --   --   --   --   < > =  values in this interval not displayed.  Liver Function Tests:  Recent Labs Lab 08/22/13 0458 08/26/13 0319  AST 26 31  ALT 10 16  ALKPHOS 65 68  BILITOT 0.4 0.3  PROT 6.8 7.9  ALBUMIN 3.5 4.2   No results found for this basename: LIPASE, AMYLASE,  in the last 168 hours No results found for this basename: AMMONIA,  in the last 168 hours  CBC:  Recent Labs Lab 08/21/13 1343 08/22/13 0458 08/26/13 0319  WBC 6.3 8.5 8.8  NEUTROABS 2.7  --  5.9  HGB 12.8 12.8 12.6  HCT 34.9* 35.8* 36.8  MCV 81.7 82.9 88.5  PLT 232 244 266    Cardiac Enzymes:  Recent Labs Lab 08/26/13 0319  TROPONINI <0.30    BNP: No components found with this basename:  POCBNP,   CBG:  Recent Labs Lab 08/24/13 1203 08/24/13 1702 08/25/13 0729 08/25/13 1112 08/26/13 0322  GLUCAP 132* 184* 77 98 137*    Microbiology: Results for orders placed during the hospital encounter of 08/21/13  MRSA PCR SCREENING     Status: None   Collection Time    08/21/13 10:29 PM      Result Value Range Status   MRSA by PCR NEGATIVE  NEGATIVE Final   Comment:            The GeneXpert MRSA Assay (FDA     approved for NASAL specimens     only), is one component of a     comprehensive MRSA colonization     surveillance program. It is not     intended to diagnose MRSA     infection nor to guide or     monitor treatment for     MRSA infections.    Coagulation Studies:  Recent Labs  08/24/13 0448 08/25/13 0546 08/26/13 0319  LABPROT 24.0* 31.6* 28.5*  INR 2.23* 3.20* 2.80*    Urinalysis:  Recent Labs Lab 08/22/13 0801  COLORURINE YELLOW  LABSPEC <1.005*  PHURINE 6.0  GLUCOSEU NEGATIVE  HGBUR NEGATIVE  BILIRUBINUR NEGATIVE  KETONESUR NEGATIVE  PROTEINUR NEGATIVE  UROBILINOGEN 0.2  NITRITE NEGATIVE  LEUKOCYTESUR NEGATIVE    Lipid Panel:     Component Value Date/Time   CHOL 122 10/15/2012 1406   TRIG 189* 10/15/2012 1406   HDL 34* 10/15/2012 1406   CHOLHDL 3.6 10/15/2012 1406   VLDL 38 10/15/2012 1406   LDLCALC 50 10/15/2012 1406    HgbA1C:  Lab Results  Component Value Date   HGBA1C 5.7* 07/24/2013    Urine Drug Screen:   No results found for this basename: labopia, cocainscrnur, labbenz, amphetmu, thcu, labbarb    Alcohol Level: No results found for this basename: ETH,  in the last 168 hours  Other results: EKG: atrial fibrillation, rate 68 bpm.  Imaging: Ct Head (brain) Wo Contrast  08/26/2013   CLINICAL DATA:  Weakness and altered mental status. History of anterior communicating artery aneurysm.  EXAM: CT HEAD WITHOUT CONTRAST  TECHNIQUE: Contiguous axial images were obtained from the base of the skull through the vertex without  intravenous contrast.  COMPARISON:  01/14/2013  FINDINGS: There is interval development of an intraparenchymal hematoma centered in the region of the right basal ganglia and thalamus. The hematoma measures about 3 by 2.1 cm. There is also intraventricular hemorrhage in the posterior horns of the lateral ventricles bilaterally. There is no significant mass effect or midline shift. No subdural or subarachnoid hemorrhages noted. The anterior communicating artery aneurysm is again demonstrated  along the midline, measuring about 6 mm. This appears stable since the previous study. There is no evidence of any hemorrhage around the aneurysm and there is no subarachnoid or suprasellar hemorrhage. The pattern of hemorrhage is not consistent with a ruptured intracranial aneurysm. Basal ganglia calcifications are stable. Chronic atrophy and small vessel ischemic changes are again demonstrated. Mild ventricular dilatation is probably due to central atrophy. Vascular calcifications.  IMPRESSION: Intraparenchymal hematoma in the right basal ganglia/to Lamb 8 region with intraventricular hemorrhage bilaterally. No significant mass effect or midline shift. 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 The pattern bleeding does not appear to represent hemorrhage due to the aneurysm.  Findings were discussed by telephone with Dr. Read Drivers at 0313 hr on 08/26/2013.   Electronically Signed   By: Burman Nieves M.D.   On: 08/26/2013 03:16    Assessment: 79 y.o. female presenting after being found on the floor with left sided weakness and confusion.  Patient presented to [facility].  CT of the head reviewed and shows a right basal ganglion hemorrhage with bilateral intraventricular extension.  No midline shift noted.    Stroke Risk Factors - atrial fibrillation, diabetes mellitus, hyperlipidemia and hypertension  Plan: 1. HgbA1c, fasting lipid panel 2. MRI, MRA  of the brain without contrast 3. PT  consult, OT consult, Speech consult 4. Echocardiogram 5. Carotid dopplers 6. Prophylactic therapy-None 7. Coagulopathy reversed 8. Telemetry monitoring 9. Frequent neuro checks 10. Repeat head CT in 24hours 11. Swallow screen 12. Sliding scale coverage 13. Repeat PT/INR and follow sodium  This patient is critically ill and at significant risk of neurological worsening, death and care requires constant monitoring of vital signs, hemodynamics,respiratory and cardiac monitoring, neurological assessment, discussion with family, other specialists and medical decision making of high complexity. I spent 60 minutes of neurocritical care time  in the care of  this patient.  Thana Farr, MD [facility] [phone] 08/26/2013  6:55 AM   Thana Farr, MD [facility] [phone] 08/26/2013, 6:36 AM

## 2013-08-26 NOTE — Progress Notes (Signed)
UR completed.  Dellene Mcgroarty, RN BSN MHA CCM Trauma/Neuro ICU Case Manager [phone]  

## 2013-08-26 NOTE — ED Provider Notes (Addendum)
CSN: [id number]     Arrival date & time 08/26/13  9562 History   First MD Initiated Contact with Patient 08/26/13 0254     Chief Complaint  Patient presents with  . Altered Mental Status   (Consider location/radiation/quality/duration/timing/severity/associated sxs/prior Treatment) HPI Level 5 Caveat: altered LOC This is a 79 year old female who was discharged from the hospital yesterday after being treated for severe hyponatremia. Her initial sodium was 118 and she was discharged with a sodium of 138. Yesterday evening her family noted her to be tremulous and confused. She wandered out of her bedroom into the kitchen looking for the bathroom. They also noted her to be asking inappropriate questions. They also noticed a mild left facial droop. This morning she attempted to get up and go to the bathroom and fell. The family found her lying on the floor in her bedroom. She was more confused than she had been and they noticed worsening left-sided facial droop. On arrival she was noticed to have left-sided weakness. She is complaining of a headache and her family states she's had a headache for the past 4 days.  Past Medical History  Diagnosis Date  . Diabetes mellitus   . Hyperlipidemia   . Eosinophilia   . Cataract   . Hypertension   . Atrial fibrillation prior to 2008    moderate biatrial enlargement in 2009; mild to moderate LVH with a low-normal EF  . GERD (gastroesophageal reflux disease)     Hemorrhoids; history of peptic ulcer disease  . DJD (degenerative joint disease) of knee   . Chronic anticoagulation   . Aortic valve disease     2009: mild stenosis and regurgitation; peak gradient of 30-35 mmHg , subsequent AVR at [facility]  . Incontinence     Mild  . Hip fracture, left 12/12/2012  . Foot injury 06/25/2012  . Degenerative joint disease of knee 01/29/2008    Bilateral    . BREAST CANCER, HX OF 01/29/2008    Lumpectomy, radiation therapy  Arimidex stopped in July 2013    . Syncope  and collapse 11/11/2012  . Diverticulosis   . Umbilical hernia   . Cholelithiasis   . DDD (degenerative disc disease), lumbar   . Vertebral compression fracture     lumbar  . Chronic pain   . Paresthesias     feet  . Gastroparesis   . Sleep apnea     CPAP  . Carcinoma of breast     Right mastectomy; radiation and hormonal therapy   Past Surgical History  Procedure Laterality Date  . Exploration post operative open heart    . Colonoscopy      Approximately 2000  . Cardiac valve replacement      [facility]  . Cardiac valve replacement    . Breast surgery    . Breast lumpectomy    . Esophagogastroduodenoscopy N/A 02/20/2013    Procedure: ESOPHAGOGASTRODUODENOSCOPY (EGD);  Surgeon: Malissa Hippo, MD;  Location: [facility];  Service: Endoscopy;  Laterality: N/A;  . Mastectomy     Family History  Problem Relation Age of Onset  . Heart disease Mother   . Stroke Father   . Cancer Sister   . Heart disease Sister   . Stroke Sister   . Stroke Brother    History  Substance Use Topics  . Smoking status: Never Smoker   . Smokeless tobacco: Not on file  . Alcohol Use: No   OB History   Grav Para Term Preterm Abortions  TAB SAB Ect Mult Living                 Review of Systems  Unable to perform ROS   Allergies  Nsaids; Codeine; and Adhesive  Home Medications   Current Outpatient Rx  Name  Route  Sig  Dispense  Refill  . acetaminophen (TYLENOL) 650 MG CR tablet   Oral   Take 1 tablet (650 mg total) by mouth every 8 (eight) hours as needed for pain.         Marland Kitchen albuterol (PROVENTIL HFA;VENTOLIN HFA) 108 (90 BASE) MCG/ACT inhaler   Inhalation   Inhale 2 puffs into the lungs every 4 (four) hours as needed for wheezing.   1 Inhaler   2   . albuterol (PROVENTIL) (2.5 MG/3ML) 0.083% nebulizer solution   Nebulization   Take 3 mLs (2.5 mg total) by nebulization every 6 (six) hours as needed for wheezing.   75 mL   3   . amLODipine (NORVASC) 5 MG tablet   Oral   Take  1 tablet (5 mg total) by mouth daily.   20 tablet   1   . clotrimazole-betamethasone (LOTRISONE) cream   Topical   Apply 1 application topically 2 (two) times daily.         . diclofenac sodium (VOLTAREN) 1 % GEL   Topical   Apply 1 application topically daily as needed (for knee pain).         Marland Kitchen glycerin adult (GLYCERIN ADULT) 2 G SUPP   Rectal   Place 1 suppository rectally once.   20 each   2   . HYDROcodone-acetaminophen (NORCO/VICODIN) 5-325 MG per tablet   Oral   Take 1 tablet by mouth at bedtime as needed for pain.         Marland Kitchen levothyroxine (SYNTHROID, LEVOTHROID) 75 MCG tablet   Oral   Take 1 tablet (75 mcg total) by mouth daily before breakfast.   30 tablet   1   . Multiple Vitamins-Minerals (CENTRUM SILVER ULTRA WOMENS PO)   Oral   Take 1 tablet by mouth daily.          Marland Kitchen omeprazole (PRILOSEC) 20 MG capsule   Oral   Take 1 capsule (20 mg total) by mouth 2 (two) times daily.   60 capsule   5   . predniSONE (DELTASONE) 5 MG tablet   Oral   Take 3 tablets (15 mg total) by mouth daily with breakfast.   15 tablet   1   . simvastatin (ZOCOR) 20 MG tablet   Oral   Take 1 tablet (20 mg total) by mouth at bedtime.   30 tablet   6   . warfarin (COUMADIN) 5 MG tablet   Oral   Take 5 mg by mouth daily at 6 PM. Has taken 10 mg (2 tablets) for the past 3 nights, Home Health Care was supposed to come out today AND CHECK PATIENTS  INR .  Normally, she takes 1 tablet (5mg ) on Mondays, Wednesdays and  Fridays. She normally takes 1 1/2 tablets (7.5mg ) on Tuesdays, Thursdays, Saturdays and Sundays         . warfarin (COUMADIN) 5 MG tablet      take as directed          BP 152/70  Pulse 77  Temp(Src) 98.9 F (37.2 C) (Oral)  Resp 20  Ht 5\' 5"  (1.651 m)  Wt 195 lb (88.451 kg)  BMI 32.45 kg/m2  SpO2  98%  Physical Exam General: Well-developed, well-nourished female in no acute distress; appearance consistent with age of record HENT: normocephalic;  atraumatic Eyes: pupils equal, round and reactive to light; extraocular muscles intact Neck: supple; no carotid bruit Heart: Irregular rhythm with frequent PVCs Lungs: clear to auscultation bilaterally Abdomen: soft; nondistended; nontender; bowel sounds present Extremities: No deformity; normal range of motion; pulses normal Neurologic: Awake, alert and oriented x 2; left hemiparesis (+3/5); left facial droop Skin: Warm and dry Psychiatric: Normal mood and affect    ED Course  Procedures (including critical care time)  CRITICAL CARE Performed by: Dajah Fischman L Total critical care time: 45 minutes Critical care time was exclusive of separately billable procedures and treating other patients. Critical care was necessary to treat or prevent imminent or life-threatening deterioration. Critical care was time spent personally by me on the following activities: development of treatment plan with patient and/or surrogate as well as nursing, discussions with consultants, evaluation of patient's response to treatment, examination of patient, obtaining history from patient or surrogate, ordering and performing treatments and interventions, ordering and review of laboratory studies, ordering and review of radiographic studies, pulse oximetry and re-evaluation of patient's condition.    MDM   Nursing notes and vitals signs, including pulse oximetry, reviewed.  Summary of this visit's results, reviewed by myself:  Labs:  Results for orders placed during the hospital encounter of 08/26/13 (from the past 24 hour(s))  PROTIME-INR     Status: Abnormal   Collection Time    08/26/13  3:19 AM      Result Value Range   Prothrombin Time 28.5 (*) 11.6 - 15.2 seconds   INR 2.80 (*) 0.00 - 1.49  APTT     Status: Abnormal   Collection Time    08/26/13  3:19 AM      Result Value Range   aPTT 43 (*) 24 - 37 seconds  CBC     Status: None   Collection Time    08/26/13  3:19 AM      Result Value  Range   WBC 8.8  4.0 - 10.5 K/uL   RBC 4.16  3.87 - 5.11 MIL/uL   Hemoglobin 12.6  12.0 - 15.0 g/dL   HCT 16.1  09.6 - 04.5 %   MCV 88.5  78.0 - 100.0 fL   MCH 30.3  26.0 - 34.0 pg   MCHC 34.2  30.0 - 36.0 g/dL   RDW 40.9  81.1 - 91.4 %   Platelets 266  150 - 400 K/uL  DIFFERENTIAL     Status: None   Collection Time    08/26/13  3:19 AM      Result Value Range   Neutrophils Relative % 68  43 - 77 %   Neutro Abs 5.9  1.7 - 7.7 K/uL   Lymphocytes Relative 24  12 - 46 %   Lymphs Abs 2.1  0.7 - 4.0 K/uL   Monocytes Relative 8  3 - 12 %   Monocytes Absolute 0.7  0.1 - 1.0 K/uL   Eosinophils Relative 0  0 - 5 %   Eosinophils Absolute 0.0  0.0 - 0.7 K/uL   Basophils Relative 0  0 - 1 %   Basophils Absolute 0.0  0.0 - 0.1 K/uL  COMPREHENSIVE METABOLIC PANEL     Status: Abnormal   Collection Time    08/26/13  3:19 AM      Result Value Range   Sodium 137  135 - 145  mEq/L   Potassium 3.5  3.5 - 5.1 mEq/L   Chloride 98  96 - 112 mEq/L   CO2 27  19 - 32 mEq/L   Glucose, Bld 155 (*) 70 - 99 mg/dL   BUN 15  6 - 23 mg/dL   Creatinine, Ser 1.61  0.50 - 1.10 mg/dL   Calcium 9.8  8.4 - 09.6 mg/dL   Total Protein 7.9  6.0 - 8.3 g/dL   Albumin 4.2  3.5 - 5.2 g/dL   AST 31  0 - 37 U/L   ALT 16  0 - 35 U/L   Alkaline Phosphatase 68  39 - 117 U/L   Total Bilirubin 0.3  0.3 - 1.2 mg/dL   GFR calc non Af Amer 60 (*) >90 mL/min   GFR calc Af Amer 70 (*) >90 mL/min  TROPONIN I     Status: None   Collection Time    08/26/13  3:19 AM      Result Value Range   Troponin I <0.30  <0.30 ng/mL  TYPE AND SCREEN     Status: None   Collection Time    08/26/13  3:19 AM      Result Value Range   ABO/RH(D) O NEG     Antibody Screen PENDING     Sample Expiration 08/29/2013    GLUCOSE, CAPILLARY     Status: Abnormal   Collection Time    08/26/13  3:22 AM      Result Value Range   Glucose-Capillary 137 (*) 70 - 99 mg/dL  POCT I-STAT TROPONIN I     Status: None   Collection Time    08/26/13  3:39  AM      Result Value Range   Troponin i, poc 0.00  0.00 - 0.08 ng/mL   Comment 3            EKG Interpretation:  Date & Time: 08/26/2013 3:22 AM  Rate: 73  Rhythm: atrial fibrillation and premature ventricular contractions (PVC)  QRS Axis: normal  Intervals: normal  ST/T Wave abnormalities: nonspecific ST/T changes  Conduction Disutrbances:none  Narrative Interpretation: poor R-wave progression  Old EKG Reviewed: no significant change     Imaging Studies: Ct Head (brain) Wo Contrast  08/26/2013   CLINICAL DATA:  Weakness and altered mental status. History of anterior communicating artery aneurysm.  EXAM: CT HEAD WITHOUT CONTRAST  TECHNIQUE: Contiguous axial images were obtained from the base of the skull through the vertex without intravenous contrast.  COMPARISON:  01/14/2013  FINDINGS: There is interval development of an intraparenchymal hematoma centered in the region of the right basal ganglia and thalamus. The hematoma measures about 3 by 2.1 cm. There is also intraventricular hemorrhage in the posterior horns of the lateral ventricles bilaterally. There is no significant mass effect or midline shift. No subdural or subarachnoid hemorrhages noted. The anterior communicating artery aneurysm is again demonstrated along the midline, measuring about 6 mm. This appears stable since the previous study. There is no evidence of any hemorrhage around the aneurysm and there is no subarachnoid or suprasellar hemorrhage. The pattern of hemorrhage is not consistent with a ruptured intracranial aneurysm. Basal ganglia calcifications are stable. Chronic atrophy and small vessel ischemic changes are again demonstrated. Mild ventricular dilatation is probably due to central atrophy. Vascular calcifications.  IMPRESSION: Intraparenchymal hematoma in the right basal ganglia/to Lamb 8 region with intraventricular hemorrhage bilaterally. No significant mass effect or midline shift. Stable appearance of the and  anterior communicating  artery aneurysm. The pattern bleeding does not appear to represent hemorrhage due to the aneurysm.  Findings were discussed by telephone with Dr. Read Drivers at 0313 hr on 08/26/2013.   Electronically Signed   By: Burman Nieves M.D.   On: 08/26/2013 03:16   3:35 AM Coumadin reversal protocol ordered using INR value from yesterday. Dr. Thad Ranger, neurohospitalist, has accepted the patient for transfer to [facility].    Hanley Seamen, MD 08/26/13 0407  Hanley Seamen, MD 08/26/13 [phone]

## 2013-08-27 ENCOUNTER — Inpatient Hospital Stay (HOSPITAL_COMMUNITY): Payer: Medicare Other

## 2013-08-27 ENCOUNTER — Encounter (HOSPITAL_COMMUNITY): Payer: Self-pay | Admitting: Radiology

## 2013-08-27 LAB — GLUCOSE, CAPILLARY
Glucose-Capillary: 94 mg/dL (ref 70–99)
Glucose-Capillary: 124 mg/dL — ABNORMAL HIGH (ref 70–99)

## 2013-08-27 MED ORDER — GADOBENATE DIMEGLUMINE 529 MG/ML IV SOLN
18.0000 mL | Freq: Once | INTRAVENOUS | Status: AC
  Administered 2013-08-27: 18 mL via INTRAVENOUS

## 2013-08-27 MED ORDER — PNEUMOCOCCAL VAC POLYVALENT 25 MCG/0.5ML IJ INJ
0.5000 mL | INJECTION | INTRAMUSCULAR | Status: DC
  Filled 2013-08-27: qty 0.5

## 2013-08-27 NOTE — Evaluation (Signed)
Speech Language Pathology Evaluation Patient Details Name: Kristin Logan MRN: [id number] DOB: 01-16-1932 Today's Date: 08/27/2013 Time: 4098-1191 SLP Time Calculation (min): 25 min  Problem List:  Patient Active Problem List   Diagnosis Date Noted  . Adrenal insufficiency 08/23/2013  . Hypotension, unspecified 08/07/2013  . Bradycardia 07/24/2013  . Hypokalemia 07/24/2013  . Nausea and vomiting 05/07/2013  . Urinary incontinence 04/14/2013  . Gastroparesis 04/01/2013  . Unspecified constipation 01/30/2013  . Atrophic vaginitis 01/14/2013  . Chronic cough 01/14/2013  . Weight loss 01/14/2013  . History of Concussion 01/14/2013  . Osteoarthritis of both knees 12/26/2012  . Weakness 11/19/2012  . Hyponatremia 11/11/2012  . Compression fracture 04/25/2012  . Brain aneurysm 04/08/2012  . Gait instability 02/06/2012  . Difficulty hearing 02/06/2012  . Depression 12/05/2011  . Hypothyroidism 12/05/2011  . Obesity 10/27/2011  . Claudication 07/21/2011  . Aortic valve disease 06/26/2011  . Chronic anticoagulation   . Sleep apnea   . Eosinophilia 03/03/2008  . HYPERLIPIDEMIA 02/26/2008  . DIABETES MELLITUS, TYPE II 01/29/2008  . HYPERTENSION 01/29/2008  . Atrial fibrillation, chronic 01/29/2008  . GERD 01/29/2008  . BREAST CANCER, HX OF 01/29/2008   Past Medical History:  Past Medical History  Diagnosis Date  . Diabetes mellitus   . Hyperlipidemia   . Eosinophilia   . Cataract   . Hypertension   . Atrial fibrillation prior to 2008    moderate biatrial enlargement in 2009; mild to moderate LVH with a low-normal EF  . GERD (gastroesophageal reflux disease)     Hemorrhoids; history of peptic ulcer disease  . DJD (degenerative joint disease) of knee   . Chronic anticoagulation   . Aortic valve disease     2009: mild stenosis and regurgitation; peak gradient of 30-35 mmHg , subsequent AVR at [facility]  . Incontinence     Mild  . Hip fracture, left 12/12/2012  . Foot  injury 06/25/2012  . Degenerative joint disease of knee 01/29/2008    Bilateral    . BREAST CANCER, HX OF 01/29/2008    Lumpectomy, radiation therapy  Arimidex stopped in July 2013    . Syncope and collapse 11/11/2012  . Diverticulosis   . Umbilical hernia   . Cholelithiasis   . DDD (degenerative disc disease), lumbar   . Vertebral compression fracture     lumbar  . Chronic pain   . Paresthesias     feet  . Gastroparesis   . Sleep apnea     CPAP  . Carcinoma of breast     Right mastectomy; radiation and hormonal therapy   Past Surgical History:  Past Surgical History  Procedure Laterality Date  . Exploration post operative open heart    . Colonoscopy      Approximately 2000  . Cardiac valve replacement      [facility]  . Cardiac valve replacement    . Breast surgery    . Breast lumpectomy    . Esophagogastroduodenoscopy N/A 02/20/2013    Procedure: ESOPHAGOGASTRODUODENOSCOPY (EGD);  Surgeon: Malissa Hippo, MD;  Location: [facility];  Service: Endoscopy;  Laterality: N/A;  . Mastectomy     HPI:  Kristin Logan is a 79 y.o. female presenting with acute delirium, then fell with left hemiparesis. Imaging confirms a intraparenchymal hematoma in the right basal ganglia/to Lamb 8 region with intraventricular hemorrhage bilaterally. Infarct felt to be secondary to anticoagulation. Patient had fall; however, it is unclear if she had left sided weakness prior to fall  or after fall. Patient has been on coumadin for atrial fibrillation.  On coumadin prior to admission. Now on no anticoagulants for secondary stroke prevention. Patient with resultant left hemiparesis, improved, however remains confused.    Assessment / Plan / Recommendation Clinical Impression  Pt demosntrates cognitive deficits is area of sustained attention and short and long term memory. Pt will benefit from ongoing acute therapy, but is also recommended to f/u with CIR.     SLP Assessment  Patient needs continued  Speech Lanaguage Pathology Services    Follow Up Recommendations  Inpatient Rehab    Frequency and Duration min 2x/week  2 weeks   Pertinent Vitals/Pain NA   SLP Goals     SLP Evaluation Prior Functioning  Cognitive/Linguistic Baseline: Within functional limits (but has assist from family for complex tasks)  Lives With: Daughter Available Help at Discharge: Family;Available 24 hours/day Education: no education, never attended school   Cognition  Overall Cognitive Status: Impaired/Different from baseline Arousal/Alertness: Awake/alert Orientation Level: Oriented to person;Oriented to place;Oriented to time;Oriented to situation Attention: Sustained (briefly) Memory: Impaired Memory Impairment: Decreased short term memory;Decreased long term memory Problem Solving: Impaired Problem Solving Impairment: Functional complex;Verbal complex    Comprehension  Auditory Comprehension Overall Auditory Comprehension: Appears within functional limits for tasks assessed    Expression Verbal Expression Overall Verbal Expression: Appears within functional limits for tasks assessed   Oral / Motor Oral Motor/Sensory Function Overall Oral Motor/Sensory Function: Appears within functional limits for tasks assessed   GO    Harlon Ditty, MA CCC-SLP [phone]  Claudine Mouton 08/27/2013, 12:35 PM

## 2013-08-27 NOTE — Evaluation (Signed)
Physical Therapy Evaluation Patient Details Name: Kristin Logan MRN: [id number] DOB: Sep 22, 1932 Today's Date: 08/27/2013 Time: 7829-5621 PT Time Calculation (min): 24 min  PT Assessment / Plan / Recommendation History of Present Illness  79 y.o. female with a history of hypertension, atrial fibrillation, status post aVR with bioprosthetic device, on chronic anticoagulation with Coumadin, hypothyroidism, depression who came to the emergency department today with the chief complaint of one-week history of progressive worsening weakness. She's been admitted with generalized weakness, decreased appetite and some hyponatremia. Started IV fluids.  She lives at home with her son and daugther in law and requires assistance with RW.   Clinical Impression  Pt with significant functional decline over the last week. Pt now requires +2 assist for all mobility. Pt to benefit from ST-SNF to address below deficits for safe transition home.     PT Assessment  Patient needs continued PT services    Follow Up Recommendations  SNF;Supervision/Assistance - 24 hour    Does the patient have the potential to tolerate intense rehabilitation      Barriers to Discharge   family unable to provide the amount of physical assist pt currently requires    Equipment Recommendations  None recommended by PT    Recommendations for Other Services     Frequency Min 3X/week    Precautions / Restrictions Precautions Precautions: Fall Restrictions Weight Bearing Restrictions: No   Pertinent Vitals/Pain Denies pain      Mobility  Bed Mobility Bed Mobility: Sit to Supine Sit to Supine: 2: Max assist;HOB flat Details for Bed Mobility Assistance: assist for LEs back into bed and to control descent of trunk Transfers Transfers: Sit to Stand;Stand to Sit;Stand Pivot Transfers Sit to Stand: 1: +2 Total assist;With upper extremity assist;From chair/3-in-1 Sit to Stand: Patient Percentage: 50% Stand to Sit: 1: +2  Total assist;Without upper extremity assist;To chair/3-in-1 Stand to Sit: Patient Percentage: 50% Stand Pivot Transfers: 1: +2 Total assist Stand Pivot Transfers: Patient Percentage: 30% Details for Transfer Assistance: Pt able to take step to/from [facility] with modA to move R LE initially. MaxA for lateral weight shift. pt very fatigued with transfers Ambulation/Gait Ambulation/Gait Assistance: Not tested (comment)    Exercises     PT Diagnosis: Generalized weakness;Difficulty walking  PT Problem List: Decreased strength;Decreased activity tolerance;Decreased balance;Decreased mobility PT Treatment Interventions: DME instruction;Gait training;Functional mobility training;Therapeutic activities;Therapeutic exercise     PT Goals(Current goals can be found in the care plan section) Acute Rehab PT Goals Patient Stated Goal: didn't report PT Goal Formulation: With patient/family Potential to Achieve Goals: Good  Visit Information  Last PT Received On: 08/27/13 Assistance Needed: +2 History of Present Illness: 79 y.o. female with a history of hypertension, atrial fibrillation, status post aVR with bioprosthetic device, on chronic anticoagulation with Coumadin, hypothyroidism, depression who came to the emergency department today with the chief complaint of one-week history of progressive worsening weakness. She's been admitted with generalized weakness, decreased appetite and some hyponatremia. Started IV fluids.  She lives at home with her son and daugther in law and requires assistance with RW.        Prior Functioning  Home Living Family/patient expects to be discharged to:: Skilled nursing facility Living Arrangements: Children Available Help at Discharge: Family;Available 24 hours/day  Lives With: Daughter Prior Function Level of Independence: Needs assistance Gait / Transfers Assistance Needed: supervision w/RW and use of scooter ADL's / Homemaking Assistance Needed: assist from  family for taking shower Communication Communication: HOH Dominant Hand: Right  Cognition  Cognition Arousal/Alertness: Awake/alert Behavior During Therapy: WFL for tasks assessed/performed Overall Cognitive Status: Impaired/Different from baseline Area of Impairment: Problem solving Problem Solving: Slow processing;Requires verbal cues;Requires tactile cues    Extremity/Trunk Assessment Upper Extremity Assessment Upper Extremity Assessment: Generalized weakness Lower Extremity Assessment Lower Extremity Assessment: Generalized weakness (bilat knee pain, was scheduled for injections today) Cervical / Trunk Assessment Cervical / Trunk Assessment: Kyphotic   Balance Balance Balance Assessed: Yes Static Standing Balance Static Standing - Balance Support: Bilateral upper extremity supported Static Standing - Level of Assistance: 1: +2 Total assist Static Standing - Comment/# of Minutes: pt stood x 1 min for hygiene s/p tolieting. v/c's to stand upright/lift head  End of Session PT - End of Session Equipment Utilized During Treatment: Gait belt Activity Tolerance: Patient limited by fatigue Patient left: in bed;with call bell/phone within reach;with family/visitor present Nurse Communication: Mobility status  GP     Marcene Brawn 08/27/2013, 2:13 PM  Lewis Shock, PT, DPT Pager #: [phone] Office #: [phone]

## 2013-08-27 NOTE — Progress Notes (Signed)
Stroke Team Progress Note  HISTORY Kristin Logan is an 79 y.o. female who was just discharged from the hospital ib 08/25/2013 after being admitted for hyponatremia. Patient had been fine all afternoon and went to bed at about 2100. At about 2230 the patient was up to go to the bathroom and seemed to have some confusion but was ambulatory. At about 0130 the patient was found on the floor after falling backward. It seemed she had fallen on her left. She was not aware of what was going on. She had urinated on herself. She Also seemed to have some left sided weakness. Patient remained confused. Patient was brought in for evaluation.   She has afib. She is on coumadin. Just discharged on 08/25/2013. INR: 3.20, 2.80 on admit.  Date last known well: Date: 08/25/2013  Time last known well: Time: 21:00  tPA Given: No: ICH   Patient was not a TPA candidate secondary to ICH. She was admitted to the neuro ICU for further evaluation and treatment.  SUBJECTIVE Extubated...no new neurological events.  OBJECTIVE Most recent Vital Signs: Filed Vitals:   08/27/13 0500 08/27/13 0600 08/27/13 0700 08/27/13 0736  BP: 123/74 115/68 133/76   Pulse: 80 70 39   Temp:    99.4 F (37.4 C)  TempSrc:    Axillary  Resp: 20 19 14    Height:      Weight:      SpO2: 96% 92% 96%    CBG (last 3)   Recent Labs  08/26/13 1156 08/26/13 1714 08/26/13 2205  GLUCAP 100* 100* 117*    IV Fluid Intake:     MEDICATIONS  . insulin aspart  0-15 Units Subcutaneous TID WC  . levothyroxine  75 mcg Oral QAC breakfast  .  morphine injection  1 mg Intravenous Once  . pantoprazole (PROTONIX) IV  40 mg Intravenous QHS  . predniSONE  15 mg Oral Q breakfast  . senna-docusate  1 tablet Oral BID   PRN:  acetaminophen, acetaminophen, labetalol  Diet:  Dysphagia  3 Activity:  Bedrest DVT Prophylaxis:  SCD  CLINICALLY SIGNIFICANT STUDIES Basic Metabolic Panel:  Recent Labs Lab 08/21/13 1343  08/26/13 0319  08/26/13 1655  NA 118*  < > 137 137  K 2.8*  < > 3.5 3.6  CL 81*  < > 98 95*  CO2 28  < > 27 28  GLUCOSE 109*  < > 155* 114*  BUN 5*  < > 15 10  CREATININE 0.67  < > 0.89 0.70  CALCIUM 9.0  < > 9.8 8.9  MG 2.1  --   --   --   PHOS 3.5  --   --   --   < > = values in this interval not displayed. Liver Function Tests:   Recent Labs Lab 08/22/13 0458 08/26/13 0319  AST 26 31  ALT 10 16  ALKPHOS 65 68  BILITOT 0.4 0.3  PROT 6.8 7.9  ALBUMIN 3.5 4.2   CBC:   Recent Labs Lab 08/21/13 1343 08/22/13 0458 08/26/13 0319  WBC 6.3 8.5 8.8  NEUTROABS 2.7  --  5.9  HGB 12.8 12.8 12.6  HCT 34.9* 35.8* 36.8  MCV 81.7 82.9 88.5  PLT 232 244 266   Coagulation:   Recent Labs Lab 08/26/13 1015 08/26/13 1655 08/26/13 2237 08/27/13 0335  LABPROT 14.8 13.6 13.7 13.4  INR 1.19 1.06 1.07 1.04   Cardiac Enzymes:   Recent Labs Lab 08/26/13 0319  TROPONINI <0.30  Urinalysis:   Recent Labs Lab 08/22/13 0801  COLORURINE YELLOW  LABSPEC <1.005*  PHURINE 6.0  GLUCOSEU NEGATIVE  HGBUR NEGATIVE  BILIRUBINUR NEGATIVE  KETONESUR NEGATIVE  PROTEINUR NEGATIVE  UROBILINOGEN 0.2  NITRITE NEGATIVE  LEUKOCYTESUR NEGATIVE   Lipid Panel    Component Value Date/Time   CHOL 122 10/15/2012 1406   TRIG 189* 10/15/2012 1406   HDL 34* 10/15/2012 1406   CHOLHDL 3.6 10/15/2012 1406   VLDL 38 10/15/2012 1406   LDLCALC 50 10/15/2012 1406   HgbA1C  Lab Results  Component Value Date   HGBA1C 5.6 08/26/2013    Urine Drug Screen:   No results found for this basename: labopia,  cocainscrnur,  labbenz,  amphetmu,  thcu,  labbarb    Alcohol Level: No results found for this basename: ETH,  in the last 168 hours  Ct Head (brain) Wo Contrast 08/26/2013    Intraparenchymal hematoma in the right basal ganglia/to Lamb 8 region with intraventricular hemorrhage bilaterally. No significant mass effect or midline shift. 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 The  pattern bleeding does not appear to represent hemorrhage due to the aneurysm.   08/27/2013  1. Similar size of right the lamina CT hemorrhage measuring 4.0 x 2.2 cm with associated 7 mm of right-to-left midline shift. No evidence of developing hydrocephalus at this time. No new hemorrhage. 2. Increased volume of intraventricular hemorrhage within the posterior horns of both lateral ventricles secondary to redistribution. 3. Stable atrophy and chronic microvascular ischemic changes.   MRI of the brain    MRA of the brain    2D Echocardiogram   EF 70%, no wall motion abnormality, LA mod-severely dilated  Carotid Doppler    CXR    EKG  .   Therapy Recommendations   Physical Exam   HEENT- Normocephalic, no lesions, without obvious abnormality. Normal external eye and conjunctiva.  Neck supple with no masses, nodes, nodules or enlargement.  Cardiovascular - S1, S2 normal  Lungs - chest clear, no wheezing, rales, normal symmetric air entry  Abdomen - soft, non-tender; bowel sounds normal; no masses, no organomegaly  Extremities - left ankle swelling and bruising. Moderate right ankle edema  Neurologic Examination:  Mental Status:  AO x 3 normal speech and language Cranial Nerves:  II: Discs flat bilaterally; Visual fields does   blink to threat, pupils equal, round, reactive to light and accommodation left gaze deviation.   III,IV, VI: ptosis not present, extra-ocular motions intact bilaterally  V,VII: left facial droop, facial light touch sensation normal bilaterally  VIII: hearing unable to test IX,X: gag reflex reduced  XI: bilateral shoulder shrug  XII: midline tongue extension  Motor:  Right : Upper extremity 5/5 Left: Upper extremity 4-/5  Lower extremity 5/5 Lower extremity 4-/5  Tone and bulk:normal tone throughout; no atrophy noted  Sensory: Pinprick and light touch intact throughout, bilaterally  Deep Tendon Reflexes: 2+ and symmetric with absent AJ's bilaterally   Plantars:  Right: mute Left: mute  Cerebellar:  normal finger-to-nose and normal heel-to-shin test  Gait: Unable to test  CV: pulses palpable throughout    ASSESSMENT Ms. Kristin Logan is a 79 y.o. female presenting with acute delirium, then fell with left hemiparesis. Imaging confirms a intraparenchymal hematoma in the right basal ganglia/to Lamb 8 region with intraventricular hemorrhage bilaterally. Infarct felt to be secondary to anticoagulation. Patient had fall; however, it is unclear if she had left sided weakness prior to fall or after fall. Patient  has been on coumadin for atrial fibrillation.  On coumadin prior to admission. Now on no anticoagulants for secondary stroke prevention. Patient with resultant left hemiparesis, improved, however remains confused. Work up underway.   Atrial fibrillation (had been on coumadin PTA)  Hypertension  Diabetes mellitus, HGB A1C  5.6  Hyperlipidemia, LDL 50, at goal, no statin needed  DYS 3 Diet  Hospital day # 1  TREATMENT/PLAN  Continue no antithrombotic for secondary stroke prevention.  Reversal protocol: INR 1.04  Blood pressure goal 160/90  Plan CT head in am, if hemorrhage stable start back baby aspirin.  Transfer out of unit.  Gwendolyn Lima. Manson Passey, [facility], MBA, MHA [facility] Pager: [phone] 08/27/2013 8:02 AM    I have personally obtained a history, examined the patient, evaluated imaging results, and formulated the assessment and plan of care. I agree with the above. Delia Heady, MD

## 2013-08-27 NOTE — Discharge Summary (Signed)
Patient seen and examined.  Agree with note as above per Toya Smothers NP.  Patient is feeling significantly improved.  Generalized weakness and fatigued are markedly improved.  Hyponatremia resolved.  Suspect that adrenal insufficiency was playing a role.  Continue prednisone and synthroid for hypothyroidism.  Patient will follow up with Dr. Fransico Him as an outpatient.  Ok for discharge home today.  MEMON,JEHANZEB

## 2013-08-27 NOTE — Consult Note (Signed)
Physical Medicine and Rehabilitation Consult Reason for Consult: Intraparenchymal hematoma Referring Physician: Dr. Pearlean Brownie    HPI: Kristin Logan is a 79 y.o. right-handed female history of diabetes mellitus, hypertension and atrial fibrillation with chronic Coumadin. Patient independent prior to admission of 08/21/2013 using a walker .Patient recently discharged the hospital 08/25/2013 after being admitted for hyponatremia. Presented 08/26/2013 after being found down on the floor after falling backwards on her left side. Cranial CT scan showed intraparenchymal hematoma 4 x 2.2 cm in width 7 mm right to left midline shift. Noted INR on admission of 2.80 and Coumadin was reversed secondary to hematoma. Neurology services consulted with full workup presently ongoing await MRI of the brain as well as MRA. Patient is maintained on a mechanical soft diet. Physical and occupational therapy evaluations pending. M.D. is requested physical medicine rehabilitation consult to consider inpatient rehabilitation services.  Patient tired after being up in chair in going to the bathroom. Required heavy physical assistance by family and staff to get to the toilet. Patient would just like to sleep now  Review of Systems  Cardiovascular: Positive for palpitations.  Gastrointestinal:       GERD  Musculoskeletal: Positive for joint pain and myalgias.  Neurological: Positive for dizziness.  All other systems reviewed and are negative.   Past Medical History  Diagnosis Date  . Diabetes mellitus   . Hyperlipidemia   . Eosinophilia   . Cataract   . Hypertension   . Atrial fibrillation prior to 2008    moderate biatrial enlargement in 2009; mild to moderate LVH with a low-normal EF  . GERD (gastroesophageal reflux disease)     Hemorrhoids; history of peptic ulcer disease  . DJD (degenerative joint disease) of knee   . Chronic anticoagulation   . Aortic valve disease     2009: mild stenosis and regurgitation;  peak gradient of 30-35 mmHg , subsequent AVR at [facility]  . Incontinence     Mild  . Hip fracture, left 12/12/2012  . Foot injury 06/25/2012  . Degenerative joint disease of knee 01/29/2008    Bilateral    . BREAST CANCER, HX OF 01/29/2008    Lumpectomy, radiation therapy  Arimidex stopped in July 2013    . Syncope and collapse 11/11/2012  . Diverticulosis   . Umbilical hernia   . Cholelithiasis   . DDD (degenerative disc disease), lumbar   . Vertebral compression fracture     lumbar  . Chronic pain   . Paresthesias     feet  . Gastroparesis   . Sleep apnea     CPAP  . Carcinoma of breast     Right mastectomy; radiation and hormonal therapy   Past Surgical History  Procedure Laterality Date  . Exploration post operative open heart    . Colonoscopy      Approximately 2000  . Cardiac valve replacement      [facility]  . Cardiac valve replacement    . Breast surgery    . Breast lumpectomy    . Esophagogastroduodenoscopy N/A 02/20/2013    Procedure: ESOPHAGOGASTRODUODENOSCOPY (EGD);  Surgeon: Malissa Hippo, MD;  Location: [facility];  Service: Endoscopy;  Laterality: N/A;  . Mastectomy     Family History  Problem Relation Age of Onset  . Heart disease Mother   . Stroke Father   . Cancer Sister   . Heart disease Sister   . Stroke Sister   . Stroke Brother    Social History:  reports  that she has never smoked. She does not have any smokeless tobacco history on file. She reports that she does not drink alcohol or use illicit drugs. Allergies:  Allergies  Allergen Reactions  . Nsaids     Bleeding ulcer  . Codeine Nausea And Vomiting  . Adhesive [Tape] Rash    Redness, and peeling skin off.    Medications Prior to Admission  Medication Sig Dispense Refill  . acetaminophen (TYLENOL) 650 MG CR tablet Take 1 tablet (650 mg total) by mouth every 8 (eight) hours as needed for pain.      Marland Kitchen amLODipine (NORVASC) 5 MG tablet Take 5 mg by mouth daily.      . clotrimazole-betamethasone  (LOTRISONE) cream Apply 1 application topically 2 (two) times daily.      . diclofenac sodium (VOLTAREN) 1 % GEL Apply 1 application topically daily as needed (for knee pain).      Marland Kitchen glycerin adult (GLYCERIN ADULT) 2 G SUPP Place 1 suppository rectally once.  20 each  2  . HYDROcodone-acetaminophen (NORCO/VICODIN) 5-325 MG per tablet Take 1 tablet by mouth at bedtime as needed for moderate pain.       Marland Kitchen levothyroxine (SYNTHROID, LEVOTHROID) 75 MCG tablet Take 75 mcg by mouth daily before breakfast.      . Multiple Vitamins-Minerals (CENTRUM SILVER ULTRA WOMENS PO) Take 1 tablet by mouth daily.       Marland Kitchen omeprazole (PRILOSEC) 20 MG capsule Take 1 capsule (20 mg total) by mouth 2 (two) times daily.  60 capsule  5  . predniSONE (DELTASONE) 5 MG tablet Take 15 mg by mouth daily with breakfast.      . simvastatin (ZOCOR) 20 MG tablet Take 1 tablet (20 mg total) by mouth at bedtime.  30 tablet  6  . warfarin (COUMADIN) 5 MG tablet Take 5-7.5 mg by mouth daily. Take 5 mg on Mon., Wed., and Fri.  Take 7.5 mg on Tues., Thurs., Sat., and Sun.      . [DISCONTINUED] amLODipine (NORVASC) 5 MG tablet Take 1 tablet (5 mg total) by mouth daily.  20 tablet  1  . [DISCONTINUED] levothyroxine (SYNTHROID, LEVOTHROID) 75 MCG tablet Take 1 tablet (75 mcg total) by mouth daily before breakfast.  30 tablet  1  . [DISCONTINUED] predniSONE (DELTASONE) 5 MG tablet Take 3 tablets (15 mg total) by mouth daily with breakfast.  15 tablet  1  . albuterol (PROVENTIL HFA;VENTOLIN HFA) 108 (90 BASE) MCG/ACT inhaler Inhale 2 puffs into the lungs every 4 (four) hours as needed for wheezing.  1 Inhaler  2  . albuterol (PROVENTIL) (2.5 MG/3ML) 0.083% nebulizer solution Take 3 mLs (2.5 mg total) by nebulization every 6 (six) hours as needed for wheezing.  75 mL  3    Home: Home Living Family/patient expects to be discharged to:: Private residence Living Arrangements: Children  Functional History:   Functional Status:  Mobility:           ADL:    Cognition: Cognition Orientation Level: Oriented to person;Disoriented to place;Disoriented to time;Disoriented to situation    Blood pressure 119/68, pulse 74, temperature 99.4 F (37.4 C), temperature source Axillary, resp. rate 16, height 5\' 5"  (1.651 m), weight 82.4 kg (181 lb 10.5 oz), SpO2 95.00%. Physical Exam  Vitals reviewed. HENT:  Head: Normocephalic.  Eyes:  Pupils reactive to light  Neck: Normal range of motion. Neck supple. No thyromegaly present.  Cardiovascular:  Cardiac rate controlled  Respiratory: Effort normal and breath sounds normal.  No respiratory distress.  GI: Soft. Bowel sounds are normal. She exhibits no distension.  Neurological:  Patient is lethargic but arousable. Needs cues for place and date. She was able to state her name. Followed basic commands  Skin: Skin is warm and dry.   motor strength is 5/5 in the right deltoid, bicep, tricep, grip 4/5 in the left deltoid, bicep, tricep, grip 4/5 bilateral hip flexors knee extensors ankle dorsiflexors and plantar flexors Oriented to person place and time Sensation intact to light touch in both upper and lower limbs Cerebellar shows minimal  dysmetria left finger nose to finger normal on the right  Results for orders placed during the hospital encounter of 08/26/13 (from the past 24 hour(s))  BASIC METABOLIC PANEL     Status: Abnormal   Collection Time    08/26/13  4:55 PM      Result Value Range   Sodium 137  135 - 145 mEq/L   Potassium 3.6  3.5 - 5.1 mEq/L   Chloride 95 (*) 96 - 112 mEq/L   CO2 28  19 - 32 mEq/L   Glucose, Bld 114 (*) 70 - 99 mg/dL   BUN 10  6 - 23 mg/dL   Creatinine, Ser 1.30  0.50 - 1.10 mg/dL   Calcium 8.9  8.4 - 86.5 mg/dL   GFR calc non Af Amer 80 (*) >90 mL/min   GFR calc Af Amer >90  >90 mL/min  PROTIME-INR     Status: None   Collection Time    08/26/13  4:55 PM      Result Value Range   Prothrombin Time 13.6  11.6 - 15.2 seconds   INR 1.06  0.00 -  1.49  GLUCOSE, CAPILLARY     Status: Abnormal   Collection Time    08/26/13  5:14 PM      Result Value Range   Glucose-Capillary 100 (*) 70 - 99 mg/dL  GLUCOSE, CAPILLARY     Status: Abnormal   Collection Time    08/26/13 10:05 PM      Result Value Range   Glucose-Capillary 117 (*) 70 - 99 mg/dL   Comment 1 Notify RN     Comment 2 Documented in Chart    PROTIME-INR     Status: None   Collection Time    08/26/13 10:37 PM      Result Value Range   Prothrombin Time 13.7  11.6 - 15.2 seconds   INR 1.07  0.00 - 1.49  PROTIME-INR     Status: None   Collection Time    08/27/13  3:35 AM      Result Value Range   Prothrombin Time 13.4  11.6 - 15.2 seconds   INR 1.04  0.00 - 1.49  GLUCOSE, CAPILLARY     Status: None   Collection Time    08/27/13  7:34 AM      Result Value Range   Glucose-Capillary 94  70 - 99 mg/dL   Ct Head Wo Contrast  08/27/2013   CLINICAL DATA:  Followup intracranial hemorrhage.  EXAM: CT HEAD WITHOUT CONTRAST  TECHNIQUE: Contiguous axial images were obtained from the base of the skull through the vertex without intravenous contrast.  COMPARISON:  Prior CT from 08/26/2013  FINDINGS: Right the lamina ache hemorrhage is grossly stable in size measuring approximately 4.0 x 2.2 cm. There is increased blood within the posterior horns of both lateral ventricles, compatible with redistribution. Overall ventricular size is not significantly changed without evidence  of developing hydrocephalus at this time. Associated right-to-left midline shift measures 7 mm. No new intracranial hemorrhage identified.  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  Mild atrophy and chronic microvascular ischemic disease is again noted, unchanged. No extra-axial fluid collection.  Calvarium remains intact. Orbits are normal. Paranasal sinuses and mastoid air cells are clear.  IMPRESSION: 1. Similar size of right the lamina CT hemorrhage measuring 4.0  x 2.2 cm with associated 7 mm of right-to-left midline shift. No evidence of developing hydrocephalus at this time. No new hemorrhage. 2. Increased volume of intraventricular hemorrhage within the posterior horns of both lateral ventricles secondary to redistribution. 3. Stable atrophy and chronic microvascular ischemic changes.   Electronically Signed   By: Rise Mu M.D.   On: 08/27/2013 06:01   Ct Head Wo Contrast  08/26/2013   CLINICAL DATA:  Known intracranial hemorrhage  EXAM: CT HEAD WITHOUT CONTRAST  TECHNIQUE: Contiguous axial images were obtained from the base of the skull through the vertex without intravenous contrast.  COMPARISON:  CT from earlier in the same day  FINDINGS: The bony calvarium remains intact. There is again noted a focus of parenchymal hemorrhage centered over the right thalamus hand central portion of the right internal capsule. It currently measures approximately 3.7 x 2.2 cm. This is an increase from the prior exam at which time it measured 3 x 2.1 cm. A small intraventricular component is noted within the posterior horns of both lateral ventricles. This is roughly stable from the prior study. Fullness at the level of the anterior communicating artery is also noted consistent with the known aneurysm. This is stable from the prior exam. There is midline shift from right to left associated with the hemorrhage over approximately 4 mm. This is stable from the prior exam is well. Stable basal ganglia calcifications are noted. Mild atrophic changes and chronic white matter ischemic changes are seen.  IMPRESSION: Slight increase in the size of the intraparenchymal hematoma centered about the right thalamus and internal capsule as described above. Stable midline shift is seen.  Chronic changes.   Electronically Signed   By: Alcide Clever M.D.   On: 08/26/2013 08:28   Ct Head (brain) Wo Contrast  08/26/2013   CLINICAL DATA:  Weakness and altered mental status. History of  anterior communicating artery aneurysm.  EXAM: CT HEAD WITHOUT CONTRAST  TECHNIQUE: Contiguous axial images were obtained from the base of the skull through the vertex without intravenous contrast.  COMPARISON:  01/14/2013  FINDINGS: There is interval development of an intraparenchymal hematoma centered in the region of the right basal ganglia and thalamus. The hematoma measures about 3 by 2.1 cm. There is also intraventricular hemorrhage in the posterior horns of the lateral ventricles bilaterally. There is no significant mass effect or midline shift. No subdural or subarachnoid hemorrhages noted. The anterior communicating artery aneurysm is again demonstrated along the midline, measuring about 6 mm. This appears stable since the previous study. There is no evidence of any hemorrhage around the aneurysm and there is no subarachnoid or suprasellar hemorrhage. The pattern of hemorrhage is not consistent with a ruptured intracranial aneurysm. Basal ganglia calcifications are stable. Chronic atrophy and small vessel ischemic changes are again demonstrated. Mild ventricular dilatation is probably due to central atrophy. Vascular calcifications.  IMPRESSION: Intraparenchymal hematoma in the right basal ganglia/to Lamb 8 region with intraventricular hemorrhage bilaterally. No significant mass effect or midline shift. Stable appearance of the and anterior communicating  artery aneurysm. The pattern bleeding does not appear to represent hemorrhage due to the aneurysm.  Findings were discussed by telephone with Dr. Read Drivers at 0313 hr on 08/26/2013.   Electronically Signed   By: Burman Nieves M.D.   On: 08/26/2013 03:16   Dg Ankle Left Port  08/26/2013   CLINICAL DATA:  Status post fall, pain and swelling  EXAM: PORTABLE LEFT ANKLE - 2 VIEW  COMPARISON:  None.  FINDINGS: The bones are osteopenic. Soft tissue swelling is appreciated within the lateral malleolar region. There is no radiographic evidence of fracture nor  dislocation. The diffuse osteopenia limits evaluation. There is persistent clinical concern further evaluation with MRI is recommended. Coarse vascular calcifications identified.  IMPRESSION: 1. Diffuse osteopenia limiting evaluation. 2. No evidence of acute fracture or dislocation 3. Soft tissue swelling in the lateral malleolar region which may represent ligamentous injury.   Electronically Signed   By: Salome Holmes M.D.   On: 08/26/2013 09:33    Assessment/Plan: Diagnosis: Right basal ganglia intracranial hemorrhage with decreased balance mild hemiparesis as well as deconditioning from recent hospitalizations for hyponatremia 1. Does the need for close, 24 hr/day medical supervision in concert with the patient's rehab needs make it unreasonable for this patient to be served in a less intensive setting? Potentially 2. Co-Morbidities requiring supervision/potential complications: Adrenal insufficiency, hypotension, diabetes type 2 with gastroparesis, chronic atrial fibrillation off warfarin secondary to intracranial hemorrhage 3. Due to bladder management, bowel management, safety, skin/wound care, disease management, medication administration, pain management and patient education, does the patient require 24 hr/day rehab nursing? Potentially 4. Does the patient require coordinated care of a physician, rehab nurse, PT (1-2 hrs/day, 5 days/week) and OT (1-2 hrs/day, 5 days/week) to address physical and functional deficits in the context of the above medical diagnosis(es)? Potentially Addressing deficits in the following areas: balance, endurance, locomotion, strength, transferring, bowel/bladder control, bathing, dressing, feeding, grooming and toileting 5. Can the patient actively participate in an intensive therapy program of at least 3 hrs of therapy per day at least 5 days per week? Potentially 6. The potential for patient to make measurable gains while on inpatient rehab is good 7. Anticipated  functional outcomes upon discharge from inpatient rehab are supervision mobility with PT, supervision with OT,  NA with SLP. 8. Estimated rehab length of stay to reach the above functional goals is: Deferred 9. Does the patient have adequate social supports to accommodate these discharge functional goals? Potentially 10. Anticipated D/C setting: Home 11. Anticipated post D/C treatments: HH therapy 12. Overall Rehab/Functional Prognosis: fair  RECOMMENDATIONS: This patient's condition is appropriate for continued rehabilitative care in the following setting: At this point the patient appears more appropriate for SNF secondary to poor endurance. If her activity tolerance improves and may consider CIR Patient has agreed to participate in recommended program. Potentially Note that insurance prior authorization may be required for reimbursement for recommended care.  Comment:     08/27/2013

## 2013-08-27 NOTE — Progress Notes (Signed)
Occupational Therapy Evaluation Patient Details Name: Kristin Logan MRN: [id number] DOB: [birth year] Today's Date: 08/27/2013 Time: 0940-1010 OT Time Calculation (min): 30 min  OT Assessment / Plan / Recommendation History of present illness 79 y.o. female with a history of hypertension, atrial fibrillation, status post aVR with bioprosthetic device, on chronic anticoagulation with Coumadin, hypothyroidism, depression who came to the emergency department today with the chief complaint of one-week history of progressive worsening weakness. She's been admitted with generalized weakness, decreased appetite and some hyponatremia. Started IV fluids.  She lives at home with her son and daugther in law and requires assistance with RW.    Clinical Impression   PTA, pt lived with family and was mod I with ADL and mobility @ RW level. Pt with significant funcitonal decline and requires total A +2 with mobility due to B knee pain. Feel pt most appropriate for rehab at SNF due to level of assistance needed for mobility and ADL given painful knees/joints and generalized weakness. Pt will benefit from skilled OT services to facilitate D/C to next venue due to below deficits.     OT Assessment  Patient needs continued OT Services    Follow Up Recommendations  SNF    Barriers to Discharge      Equipment Recommendations  None recommended by OT    Recommendations for Other Services    Frequency  Min 2X/week    Precautions / Restrictions Precautions Precautions: Fall Precaution Comments: B painful knees Restrictions Weight Bearing Restrictions: No   Pertinent Vitals/Pain Vitals stable    ADL  Grooming: Supervision/safety;Set up Where Assessed - Grooming: Supported sitting Upper Body Bathing: Moderate assistance Where Assessed - Upper Body Bathing: Supported sitting Lower Body Bathing: Moderate assistance Where Assessed - Lower Body Bathing: Supported sitting Upper Body Dressing: Moderate  assistance Where Assessed - Upper Body Dressing: Supported sitting Lower Body Dressing: +1 Total assistance Where Assessed - Lower Body Dressing: Supported sit to Pharmacist, hospital: +2 Total assistance Toilet Transfer: Patient Percentage: 40% Statistician Method: Sit to stand Toileting - Architect and Hygiene: +1 Total assistance Where Assessed - Glass blower/designer Manipulation and Hygiene:  (incontinenent of urine) Equipment Used: Gait belt Transfers/Ambulation Related to ADLs: +2 total A    OT Diagnosis: Generalized weakness;Cognitive deficits;Acute pain  OT Problem List: Decreased strength;Decreased range of motion;Decreased activity tolerance;Impaired balance (sitting and/or standing);Decreased cognition;Decreased safety awareness;Decreased knowledge of use of DME or AE;Decreased knowledge of precautions;Cardiopulmonary status limiting activity;Impaired sensation;Obesity;Impaired UE functional use;Pain OT Treatment Interventions: Self-care/ADL training;Therapeutic exercise;Energy conservation;DME and/or AE instruction;Therapeutic activities;Cognitive remediation/compensation;Visual/perceptual remediation/compensation;Patient/family education;Balance training   OT Goals(Current goals can be found in the care plan section) Acute Rehab OT Goals Patient Stated Goal: to gain my independence OT Goal Formulation: With patient Time For Goal Achievement: 09/10/13 Potential to Achieve Goals: Good  Visit Information  Last OT Received On: 08/27/13 Assistance Needed: +2 History of Present Illness: 79 y.o. female with a history of hypertension, atrial fibrillation, status post aVR with bioprosthetic device, on chronic anticoagulation with Coumadin, hypothyroidism, depression who came to the emergency department today with the chief complaint of one-week history of progressive worsening weakness. She's been admitted with generalized weakness, decreased appetite and some  hyponatremia. Started IV fluids.  She lives at home with her son and daugther in law and requires assistance with RW.        Prior Functioning     Home Living Family/patient expects to be discharged to:: Skilled nursing facility Living Arrangements: Children Prior Function Level  of Independence: Needs assistance Gait / Transfers Assistance Needed: supervision w/RW and use of scooter ADL's / Homemaking Assistance Needed: assist from family for taking shower Communication Communication: HOH Dominant Hand: Right         Vision/Perception Vision - History Baseline Vision: Wears glasses all the time Patient Visual Report: Blurring of vision Vision - Assessment Eye Alignment: Within Functional Limits Additional Comments: will further assess Perception Perception: Within Functional Limits Praxis Praxis: Intact   Cognition  Cognition Arousal/Alertness: Awake/alert Behavior During Therapy: WFL for tasks assessed/performed Overall Cognitive Status: Impaired/Different from baseline Area of Impairment: Problem solving;Orientation;Memory;Awareness Orientation Level: Disoriented to;Place;Time;Situation Memory: Decreased short-term memory Awareness: Emergent Problem Solving: Slow processing;Requires verbal cues;Requires tactile cues    Extremity/Trunk Assessment Upper Extremity Assessment LUE Deficits / Details: general weakness. Greater proximally LUE Sensation:  (appears WFL) Cervical / Trunk Assessment Cervical / Trunk Assessment: Kyphotic     Mobility Bed Mobility Bed Mobility: Sit to Supine Sit to Supine: 2: Max assist;HOB flat Details for Bed Mobility Assistance: assist for LEs back into bed and to control descent of trunk Transfers Sit to Stand: 1: +2 Total assist;With upper extremity assist;From chair/3-in-1 Sit to Stand: Patient Percentage: 50% Stand to Sit: 1: +2 Total assist;Without upper extremity assist;To chair/3-in-1 Stand to Sit: Patient Percentage:  50% Details for Transfer Assistance: Pt able to take step to/from [facility] with modA to move R LE initially. MaxA for lateral weight shift. pt very fatigued with transfers     Exercise     Balance Balance Balance Assessed: Yes (L bias) Static Standing Balance Static Standing - Balance Support: Bilateral upper extremity supported Static Standing - Level of Assistance: 1: +2 Total assist   End of Session OT - End of Session Equipment Utilized During Treatment: Gait belt Activity Tolerance: Patient tolerated treatment well Patient left: in chair;with call bell/phone within reach Nurse Communication: Mobility status;Need for lift equipment (rec staff use bariactric stedy)  GO     Lorene Samaan,HILLARY 08/27/2013, 7:01 PM [facility], OTR/L  [phone] 08/27/2013

## 2013-08-28 LAB — GLUCOSE, CAPILLARY

## 2013-08-28 LAB — PROTIME-INR: Prothrombin Time: 14.1 seconds (ref 11.6–15.2)

## 2013-08-28 MED ORDER — PANTOPRAZOLE SODIUM 40 MG PO TBEC
40.0000 mg | DELAYED_RELEASE_TABLET | Freq: Every day | ORAL | Status: DC
  Administered 2013-08-29 – 2013-09-01 (×4): 40 mg via ORAL
  Filled 2013-08-28 (×3): qty 1

## 2013-08-28 NOTE — Progress Notes (Signed)
Stroke Team Progress Note  HISTORY Kristin Logan is an 79 y.o. female who was just discharged from the hospital ib 08/25/2013 after being admitted for hyponatremia. Patient had been fine all afternoon and went to bed at about 2100. At about 2230 the patient was up to go to the bathroom and seemed to have some confusion but was ambulatory. At about 0130 the patient was found on the floor after falling backward. It seemed she had fallen on her left. She was not aware of what was going on. She had urinated on herself. She Also seemed to have some left sided weakness. Patient remained confused. Patient was brought in for evaluation.   She has afib. She is on coumadin. Just discharged on 08/25/2013. INR: 3.20, 2.80 on admit.  Date last known well: Date: 08/25/2013  Time last known well: Time: 21:00  tPA Given: No: ICH  Patient was not a TPA candidate secondary to ICH. She was admitted to the neuro ICU for further evaluation and treatment.  SUBJECTIVE Patient stable. No new neurological symptoms. Still gets tired by end of day and falls asleep easily.  OBJECTIVE Most recent Vital Signs: Filed Vitals:   08/27/13 1700 08/27/13 2200 08/28/13 0255 08/28/13 0521  BP: 158/73 156/100 130/55 134/62  Pulse: 74 78 76 75  Temp: 99.4 F (37.4 C) 97.9 F (36.6 C) 97.8 F (36.6 C) 98.5 F (36.9 C)  TempSrc: Oral Oral Oral Oral  Resp: 16 18 18 18   Height:      Weight:      SpO2: 98% 98% 94% 96%   CBG (last 3)   Recent Labs  08/27/13 1611 08/27/13 2233 08/28/13 0633  GLUCAP 190* 155* 91    IV Fluid Intake:     MEDICATIONS  . insulin aspart  0-15 Units Subcutaneous TID WC  . levothyroxine  75 mcg Oral QAC breakfast  .  morphine injection  1 mg Intravenous Once  . pantoprazole (PROTONIX) IV  40 mg Intravenous QHS  . pneumococcal 23 valent vaccine  0.5 mL Intramuscular Tomorrow-1000  . predniSONE  15 mg Oral Q breakfast  . senna-docusate  1 tablet Oral BID   PRN:  acetaminophen,  acetaminophen, labetalol  Diet:  Dysphagia  3 Activity:  Ambulate with assistance DVT Prophylaxis:  SCD  CLINICALLY SIGNIFICANT STUDIES Basic Metabolic Panel:  Recent Labs Lab 08/21/13 1343  08/26/13 0319 08/26/13 1655  NA 118*  < > 137 137  K 2.8*  < > 3.5 3.6  CL 81*  < > 98 95*  CO2 28  < > 27 28  GLUCOSE 109*  < > 155* 114*  BUN 5*  < > 15 10  CREATININE 0.67  < > 0.89 0.70  CALCIUM 9.0  < > 9.8 8.9  MG 2.1  --   --   --   PHOS 3.5  --   --   --   < > = values in this interval not displayed. Liver Function Tests:   Recent Labs Lab 08/22/13 0458 08/26/13 0319  AST 26 31  ALT 10 16  ALKPHOS 65 68  BILITOT 0.4 0.3  PROT 6.8 7.9  ALBUMIN 3.5 4.2   CBC:   Recent Labs Lab 08/21/13 1343 08/22/13 0458 08/26/13 0319  WBC 6.3 8.5 8.8  NEUTROABS 2.7  --  5.9  HGB 12.8 12.8 12.6  HCT 34.9* 35.8* 36.8  MCV 81.7 82.9 88.5  PLT 232 244 266   Coagulation:   Recent Labs Lab 08/26/13  1655 08/26/13 2237 08/27/13 0335 08/28/13 0557  LABPROT 13.6 13.7 13.4 14.1  INR 1.06 1.07 1.04 1.11   Cardiac Enzymes:   Recent Labs Lab 08/26/13 0319  TROPONINI <0.30   Urinalysis:   Recent Labs Lab 08/22/13 0801  COLORURINE YELLOW  LABSPEC <1.005*  PHURINE 6.0  GLUCOSEU NEGATIVE  HGBUR NEGATIVE  BILIRUBINUR NEGATIVE  KETONESUR NEGATIVE  PROTEINUR NEGATIVE  UROBILINOGEN 0.2  NITRITE NEGATIVE  LEUKOCYTESUR NEGATIVE   Lipid Panel    Component Value Date/Time   CHOL 122 10/15/2012 1406   TRIG 189* 10/15/2012 1406   HDL 34* 10/15/2012 1406   CHOLHDL 3.6 10/15/2012 1406   VLDL 38 10/15/2012 1406   LDLCALC 50 10/15/2012 1406   HgbA1C  Lab Results  Component Value Date   HGBA1C 5.6 08/26/2013    Urine Drug Screen:   No results found for this basename: labopia,  cocainscrnur,  labbenz,  amphetmu,  thcu,  labbarb    Alcohol Level: No results found for this basename: ETH,  in the last 168 hours  Ct Head (brain) Wo Contrast 08/26/2013    Intraparenchymal  hematoma in the right basal ganglia/to Lamb 8 region with intraventricular hemorrhage bilaterally. No significant mass effect or midline shift. 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 The pattern bleeding does not appear to represent hemorrhage due to the aneurysm.    MRI of the brain   The right intraventricular hemorrhage is again seen, measuring 2.1 x 4.1 x 2.3 cm. Midline shift measures 5 mm at the level of the hemorrhage. There is mass effect on the right thalamus with edematous changes. No acute infarct is present. Diffusion signal abnormality at the periphery of the hemorrhage is likely artifactual. Extensive periventricular and subcortical white matter disease is seen bilaterally. Multiple remote lacunar infarcts are present in the basal ganglia. And Layering blood products are present within the occipital horns of the ventricles bilaterally. The postcontrast images demonstrate no pathologic enhancement. No mass lesion is evident. 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 The A1 and M1 segments are normal. A 6.5 cm anterior communicating artery aneurysm is not significantly changed in size or morphology. The MCA bifurcations are intact. Small vessel attenuation is evident bilaterally. The right vertebral artery is the dominant vessel. The AICA vessels are dominant bilaterally. The basilar artery is within normal limits. Both posterior cerebral arteries originate from the basilar tip. There is attenuation of distal PCA branch vessels bilaterally.  2D Echocardiogram    Carotid Doppler    CXR    EKG  .   Therapy Recommendations   Physical Exam   HEENT- Normocephalic, no lesions, without obvious abnormality Cardiovascular - regular Neurologic Examination:  Mental Status:  Alert,  oriented. Cranial Nerves:  II: Pupils equal, round, reactive to light and accommodation left gaze deviation.   III,IV, VI: ptosis not present, extra-ocular motions intact bilaterally  V,VII: left facial droop, facial light touch sensation normal bilaterally  XI: bilateral shoulder shrug  XII: midline tongue extension  Motor:  Right : Upper extremity 5/5 Left: Upper extremity 4/5  Lower extremity 5/5 Lower extremity 4/5  Normal tone Sensory: Normal touch sensation bilaterally  Deep Tendon Reflexes: 2+ and symmetric   Cerebellar: normal FTN CV: pulses palpable throughout    ASSESSMENT Kristin Logan is a 79 y.o. female presenting with  acute delirium, then fell with left hemiparesis. Imaging confirms a intraparenchymal hematoma in the right basal ganglia/to Lamb 8 region with intraventricular hemorrhage bilaterally (25cc). Infarct felt to be secondary to anticoagulation. Patient had fall; however, it is unclear if she had left sided weakness prior to fall or after fall. Patient has been on coumadin for atrial fibrillation.  On coumadin prior to admission. Now on no anticoagulants for secondary stroke prevention. Patient with resultant left hemiparesis, improved, however remains confused. Work up underway.   Atrial fibrillation  Hypertension  Diabetes mellitus   Hyperlipidemia  Hospital day # 2  TREATMENT/PLAN  Continue no antithrombotic for secondary stroke prevention due to hemorrhage.  Reversal protocol:  Vitamin K infused. Pharmacy assisting with reversal protocol. Follow up INR 1.11  Blood pressure goal 160/90  SNF unless activity level improves, then CIR.  Will need to add baby aspirin back to medication regimen if follow up scans negative and if remains neurologically stable since patient has afib and therefore remains a risk for embolic stroke.  Gwendolyn Lima. Manson Passey, [facility], MBA, MHA [facility] Pager: [phone] 08/28/2013 8:26 AM  I have personally  obtained a history, examined the patient, evaluated imaging results, and formulated the assessment and plan of care. I agree with the above.  Lesly Dukes

## 2013-08-29 LAB — GLUCOSE, CAPILLARY
Glucose-Capillary: 81 mg/dL (ref 70–99)
Glucose-Capillary: 102 mg/dL — ABNORMAL HIGH (ref 70–99)

## 2013-08-29 MED ORDER — DIPHENHYDRAMINE HCL 25 MG PO CAPS
25.0000 mg | ORAL_CAPSULE | Freq: Once | ORAL | Status: AC
  Administered 2013-08-29: 25 mg via ORAL
  Filled 2013-08-29: qty 1

## 2013-08-29 MED ORDER — SIMVASTATIN 20 MG PO TABS
20.0000 mg | ORAL_TABLET | Freq: Every day | ORAL | Status: DC
  Administered 2013-08-29 – 2013-08-31 (×3): 20 mg via ORAL
  Filled 2013-08-29 (×4): qty 1

## 2013-08-29 NOTE — Progress Notes (Signed)
Physical Therapy Treatment Patient Details Name: Kristin Logan MRN: [id number] DOB: 1932/08/18 Today's Date: 08/29/2013 Time: [id number] PT Time Calculation (min): 31 min  PT Assessment / Plan / Recommendation  History of Present Illness 79 y.o. female with a history of hypertension, atrial fibrillation, status post aVR with bioprosthetic device, on chronic anticoagulation with Coumadin, hypothyroidism, depression who came to the emergency department today with the chief complaint of one-week history of progressive worsening weakness. She's been admitted with generalized weakness, decreased appetite and some hyponatremia. Started IV fluids.  She lives at home with her son and daugther in Social worker and requires assistance with RW.    PT Comments   Pt fearful while working with female PT and requested son A with mobility.  Pt and son ed on safety with mobility together and allowing pt to do as much as she can without son purely lifting her.  Will continue to follow.    Follow Up Recommendations  SNF     Does the patient have the potential to tolerate intense rehabilitation     Barriers to Discharge        Equipment Recommendations  None recommended by PT    Recommendations for Other Services    Frequency Min 3X/week   Progress towards PT Goals Progress towards PT goals: Progressing toward goals  Plan Current plan remains appropriate    Precautions / Restrictions Precautions Precautions: Fall Precaution Comments: B painful knees Restrictions Weight Bearing Restrictions: No   Pertinent Vitals/Pain R knee pain.      Mobility  Bed Mobility Bed Mobility: Supine to Sit;Sitting - Scoot to Edge of Bed Supine to Sit: 2: Max assist;With rails Sitting - Scoot to [place] of Bed: 2: Max assist Details for Bed Mobility Assistance: cues for sequencing, encouragement, and safe technique.   Transfers Transfers: Sit to Stand;Stand to Sit Sit to Stand: 3: Mod assist;With upper extremity assist;From  bed;From chair/3-in-1 Stand to Sit: 3: Mod assist;With upper extremity assist;To chair/3-in-1 Stand Pivot Transfers: 2: Max assist Details for Transfer Assistance: cues for step-by-step through transfer and encouragement.  pt fearful of falling and requested having her son A her with trasnfer off of 3-in-1.  Cues for pt and son on safe technique and encouraging pt to do more rather than son to simply lift her.   Ambulation/Gait Ambulation/Gait Assistance: Not tested (comment) Stairs: No Wheelchair Mobility Wheelchair Mobility: No Modified Rankin (Stroke Patients Only) Pre-Morbid Rankin Score: Moderate disability Modified Rankin: Severe disability    Exercises     PT Diagnosis:    PT Problem List:   PT Treatment Interventions:     PT Goals (current goals can now be found in the care plan section) Acute Rehab PT Goals Patient Stated Goal: to gain my independence Potential to Achieve Goals: Good  Visit Information  Last PT Received On: 08/29/13 Assistance Needed: +2 History of Present Illness: 79 y.o. female with a history of hypertension, atrial fibrillation, status post aVR with bioprosthetic device, on chronic anticoagulation with Coumadin, hypothyroidism, depression who came to the emergency department today with the chief complaint of one-week history of progressive worsening weakness. She's been admitted with generalized weakness, decreased appetite and some hyponatremia. Started IV fluids.  She lives at home with her son and daugther in law and requires assistance with RW.     Subjective Data  Patient Stated Goal: to gain my independence   Cognition  Cognition Arousal/Alertness: Awake/alert Behavior During Therapy: WFL for tasks assessed/performed Overall Cognitive Status: Impaired/Different from baseline  Area of Impairment: Problem solving;Memory;Awareness;Safety/judgement Memory: Decreased short-term memory Safety/Judgement: Decreased awareness of safety;Decreased  awareness of deficits Awareness: Emergent Problem Solving: Slow processing;Requires verbal cues;Requires tactile cues    Balance  Balance Balance Assessed: Yes Static Standing Balance Static Standing - Balance Support: Bilateral upper extremity supported;During functional activity Static Standing - Level of Assistance: 3: Mod assist Static Standing - Comment/# of Minutes: Mod A to maintain balance while donning/doffing brief.    End of Session PT - End of Session Equipment Utilized During Treatment: Gait belt Activity Tolerance: Patient limited by fatigue Patient left: in chair;with call bell/phone within reach;with family/visitor present Nurse Communication: Mobility status   GP     Sunny Schlein, [place] [phone] 08/29/2013, 2:26 PM

## 2013-08-29 NOTE — Clinical Social Work Placement (Signed)
Clinical Social Work Department CLINICAL SOCIAL WORK PLACEMENT NOTE 08/29/2013  Patient:  Kristin Logan, Kristin Logan  Account Number:  [id number] Admit date:  08/26/2013  Clinical Social Worker:  Irving Burton [place], LCSWA  Date/time:  08/29/2013 01:32 PM  Clinical Social Work is seeking post-discharge placement for this patient at the following level of care:   SKILLED NURSING   (*CSW will update this form in Epic as items are completed)   08/29/2013  Patient/family provided with [facility] Department of Clinical Social Works list of facilities offering this level of care within the geographic area requested by the patient (or if unable, by the patients family).  08/29/2013  Patient/family informed of their freedom to choose among providers that offer the needed level of care, that participate in Medicare, Medicaid or managed care program needed by the patient, have an available bed and are willing to accept the patient.  08/29/2013  Patient/family informed of [facility] ownership interest in [facility], as well as of the fact that they are under no obligation to receive care at this facility.  PASARR submitted to EDS on 08/29/2013 PASARR number received from EDS on 08/29/2013  FL2 transmitted to all facilities in geographic area requested by pt/family on  08/29/2013 FL2 transmitted to all facilities within larger geographic area on   Patient informed that his/her managed care company has contracts with or will negotiate with  certain facilities, including the following:     Patient/family informed of bed offers received:   Patient chooses bed at  Physician recommends and patient chooses bed at    Patient to be transferred to  on   Patient to be transferred to facility by   The following physician request were entered in Epic:   Additional Comments:  Darlyn Chamber, Theresia Majors Clinical Social Worker [phone]

## 2013-08-29 NOTE — Progress Notes (Addendum)
Stroke Team Progress Note  HISTORY Kristin Logan is an 79 y.o. female who was just discharged from the hospital ib 08/25/2013 after being admitted for hyponatremia. Patient had been fine all afternoon and went to bed at about 2100. At about 2230 the patient was up to go to the bathroom and seemed to have some confusion but was ambulatory. At about 0130 the patient was found on the floor after falling backward. It seemed she had fallen on her left. She was not aware of what was going on. She had urinated on herself. She Also seemed to have some left sided weakness. Patient remained confused. Patient was brought in for evaluation.   She has afib. She is on coumadin. Just discharged on 08/25/2013. INR: 3.20, 2.80 on admit.  Date last known well: Date: 08/25/2013  Time last known well: Time: 21:00  tPA Given: No: ICH  Patient was not a TPA candidate secondary to ICH. She was admitted to the neuro ICU for further evaluation and treatment.  SUBJECTIVE Patient stable. No new neurological symptoms.  F/U CT scheduled for 12/1  OBJECTIVE Most recent Vital Signs: Filed Vitals:   08/29/13 0223 08/29/13 0646 08/29/13 0657 08/29/13 0733  BP: 149/62 170/66 163/79 138/54  Pulse: 62 56 56 54  Temp: 97.7 F (36.5 C) 98 F (36.7 C)    TempSrc: Oral Oral    Resp: 18 18    Height:      Weight:      SpO2: 99% 98%     CBG (last 3)   Recent Labs  08/28/13 1636 08/28/13 2109 08/29/13 0620  GLUCAP 132* 163* 81    IV Fluid Intake:     MEDICATIONS  . insulin aspart  0-15 Units Subcutaneous TID WC  . levothyroxine  75 mcg Oral QAC breakfast  .  morphine injection  1 mg Intravenous Once  . pantoprazole  40 mg Oral Q0600  . pneumococcal 23 valent vaccine  0.5 mL Intramuscular Tomorrow-1000  . predniSONE  15 mg Oral Q breakfast  . senna-docusate  1 tablet Oral BID   PRN:  acetaminophen, acetaminophen, labetalol  Diet:  Dysphagia  3 Activity:  Ambulate with assistance DVT Prophylaxis:   SCD  CLINICALLY SIGNIFICANT STUDIES Basic Metabolic Panel:   Recent Labs Lab 08/26/13 0319 08/26/13 1655  NA 137 137  K 3.5 3.6  CL 98 95*  CO2 27 28  GLUCOSE 155* 114*  BUN 15 10  CREATININE 0.89 0.70  CALCIUM 9.8 8.9   Liver Function Tests:   Recent Labs Lab 08/26/13 0319  AST 31  ALT 16  ALKPHOS 68  BILITOT 0.3  PROT 7.9  ALBUMIN 4.2   CBC:   Recent Labs Lab 08/26/13 0319  WBC 8.8  NEUTROABS 5.9  HGB 12.6  HCT 36.8  MCV 88.5  PLT 266   Coagulation:   Recent Labs Lab 08/26/13 1655 08/26/13 2237 08/27/13 0335 08/28/13 0557  LABPROT 13.6 13.7 13.4 14.1  INR 1.06 1.07 1.04 1.11   Cardiac Enzymes:   Recent Labs Lab 08/26/13 0319  TROPONINI <0.30   Urinalysis:  No results found for this basename: COLORURINE, APPERANCEUR, LABSPEC, PHURINE, GLUCOSEU, HGBUR, BILIRUBINUR, KETONESUR, PROTEINUR, UROBILINOGEN, NITRITE, LEUKOCYTESUR,  in the last 168 hours Lipid Panel    Component Value Date/Time   CHOL 122 10/15/2012 1406   TRIG 189* 10/15/2012 1406   HDL 34* 10/15/2012 1406   CHOLHDL 3.6 10/15/2012 1406   VLDL 38 10/15/2012 1406   LDLCALC 50 10/15/2012 1406  HgbA1C  Lab Results  Component Value Date   HGBA1C 5.6 08/26/2013    Urine Drug Screen:   No results found for this basename: labopia,  cocainscrnur,  labbenz,  amphetmu,  thcu,  labbarb    Alcohol Level: No results found for this basename: ETH,  in the last 168 hours  Ct Head (brain) Wo Contrast 08/26/2013    Intraparenchymal hematoma in the right basal ganglia/to Lamb 8 region with intraventricular hemorrhage bilaterally. No significant mass effect or midline shift. 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 The pattern bleeding does not appear to represent hemorrhage due to the aneurysm.    MRI of the brain   The right intraventricular hemorrhage is again seen, measuring 2.1 x 4.1 x 2.3 cm. Midline shift measures 5 mm at the level of the hemorrhage. There is  mass effect on the right thalamus with edematous changes. No acute infarct is present. Diffusion signal abnormality at the periphery of the hemorrhage is likely artifactual. Extensive periventricular and subcortical white matter disease is seen bilaterally. Multiple remote lacunar infarcts are present in the basal ganglia. And Layering blood products are present within the occipital horns of the ventricles bilaterally. The postcontrast images demonstrate no pathologic enhancement. No mass lesion is evident. 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 The A1 and M1 segments are normal. A 6.5 cm anterior communicating artery aneurysm is not significantly changed in size or morphology. The MCA bifurcations are intact. Small vessel attenuation is evident bilaterally. The right vertebral artery is the dominant vessel. The AICA vessels are dominant bilaterally. The basilar artery is within normal limits. Both posterior cerebral arteries originate from the basilar tip. There is attenuation of distal PCA branch vessels bilaterally.  2D Echocardiogram    Carotid Doppler    CXR    EKG  .   Therapy Recommendations   Physical Exam   HEENT- Normocephalic, no lesions, without obvious abnormality Cardiovascular - regular Neurologic Examination:  Mental Status:  Alert, oriented. Cranial Nerves:  II: Pupils equal, round, reactive to light and accommodation left gaze deviation.   III,IV, VI: ptosis not present, extra-ocular motions intact bilaterally  V,VII: left facial droop, facial light touch sensation normal bilaterally  XI: bilateral shoulder shrug  XII: midline tongue extension  Motor:  Right : Upper extremity 5/5 Left: Upper extremity 4/5  Lower extremity 5/5 Lower extremity 4/5  Normal tone Sensory: Normal touch sensation  bilaterally  Deep Tendon Reflexes: 2+ and symmetric   Cerebellar: normal FTN CV: pulses palpable throughout    ASSESSMENT Ms. Kristin Logan is a 79 y.o. female presenting with acute delirium, then fell with left hemiparesis. Imaging confirms a intraparenchymal hematoma in the right basal ganglia/to Lamb 8 region with intraventricular hemorrhage bilaterally (25cc). Infarct felt to be secondary to anticoagulation. Patient had fall; however, it is unclear if she had left sided weakness prior to fall or after fall. Patient has been on coumadin for atrial fibrillation.  On coumadin prior to admission. Now on no anticoagulants for secondary stroke prevention. Patient with resultant left hemiparesis, improved, however remains confused. Work up underway.   Atrial fibrillation  Hypertension  Diabetes mellitus   Hyperlipidemia, LDL 50, on statin  Hospital day # 3  TREATMENT/PLAN  Continue no antithrombotic for secondary stroke prevention due to hemorrhage.  Reversal protocol:  Vitamin  K infused. Pharmacy assisting with reversal protocol. Follow up INR 1.11  Blood pressure goal 160/90  Zocor restarted.  SNF unless activity level improves, then CIR. Patient may be discharged once bed obtained.  Will need to add baby aspirin back after f/u scan (12/1) to medication regimen if follow up scans negative and if remains neurologically stable since patient has afib and therefore remains a risk for embolic stroke.  Gwendolyn Lima. Manson Passey, [facility], MBA, MHA [facility] Pager: [phone] 08/29/2013 9:03 AM Lesly Dukes

## 2013-08-29 NOTE — Clinical Social Work Psychosocial (Signed)
Clinical Social Work Department BRIEF PSYCHOSOCIAL ASSESSMENT 08/29/2013  Patient:  Kristin Logan, Kristin Logan     Account Number:  [id number]     Admit date:  08/26/2013  Clinical Social Worker:  Sherre Lain  Date/Time:  08/29/2013 01:19 PM  Referred by:  Physician  Date Referred:  08/29/2013 Referred for  SNF Placement   Other Referral:   none.   Interview type:  Family Other interview type:   CSW spoke to pt's daughter-in-law since pt is disoriented.    PSYCHOSOCIAL DATA Living Status:  FAMILY Admitted from facility:   Level of care:   Primary support name:  Rosanne Ashing and Ferd Glassing Primary support relationship to patient:  FAMILY Degree of support available:   Strong support system. Pt lives with her son and daughter-in-law.    CURRENT CONCERNS Current Concerns  Post-Acute Placement   Other Concerns:   none.    SOCIAL WORK ASSESSMENT / PLAN CSW spoke to pt's daughter-in-law via phone due to no family present at bedside. Pt's daughter-in-law stated that pt as living with them prior to her admissions to [facility]. Pt's daughter-in-law informed CSW that family was agreeable to SNF placement and would ultimately want pt to return to living with them if she was able to return to baseline. Weekend CSW to follow-up with bed offers to family.   Assessment/plan status:  Psychosocial Support/Ongoing Assessment of Needs Other assessment/ plan:   none.   Information/referral to community resources:   [facility] bed offers.    PATIENTS/FAMILYS RESPONSE TO PLAN OF CARE: Pt's family was understanding and agreeable to CSW plan of care.      Darlyn Chamber, LCSWA Clinical Social Worker [phone]

## 2013-08-30 LAB — COMPREHENSIVE METABOLIC PANEL
ALT: 29 U/L (ref 0–35)
AST: 17 U/L (ref 0–37)
BUN: 18 mg/dL (ref 6–23)
CO2: 27 mEq/L (ref 19–32)
Calcium: 8.9 mg/dL (ref 8.4–10.5)
Creatinine, Ser: 0.61 mg/dL (ref 0.50–1.10)
GFR calc Af Amer: >90 mL/min (ref >90)
Glucose, Bld: 78 mg/dL (ref 70–99)
Sodium: 138 mEq/L (ref 135–145)
Total Protein: 6.2 g/dL (ref 6.0–8.3)

## 2013-08-30 LAB — CBC
HCT: 30.5 % — ABNORMAL LOW (ref 36.0–46.0)
MCH: 31.3 pg (ref 26.0–34.0)
MCHC: 35.1 g/dL (ref 30.0–36.0)
Platelets: 278 10*3/uL (ref 150–400)
RBC: 3.42 MIL/uL — ABNORMAL LOW (ref 3.87–5.11)
RDW: 15.1 % (ref 11.5–15.5)

## 2013-08-30 LAB — GLUCOSE, CAPILLARY
Glucose-Capillary: 80 mg/dL (ref 70–99)
Glucose-Capillary: 97 mg/dL (ref 70–99)

## 2013-08-30 NOTE — Progress Notes (Signed)
Clinical Child psychotherapist (CSW) gave patient's daughter Stanton Kidney bed offers. CSW encouraged Stanton Kidney to review bed offers with her family and make a decision about a skilled nursing facility as soon as possible.   Jetta Lout, LCSWA Weekend CSW [phone]

## 2013-08-30 NOTE — Progress Notes (Signed)
Stroke Team Progress Note  HISTORY  Kristin Logan is a 79 y.o. female who was just discharged from the hospital on 08/25/2013 after being admitted for hyponatremia. Patient had been fine that afternoon and went to bed at about 2100. At about 2230 the patient was up to go to the bathroom and seemed to have some confusion but was ambulatory. At about 0130 the patient was found on the floor after falling backward. It seemed she had fallen on her left. She was not aware of what was going on. She had urinated on herself. She also seemed to have some left sided weakness. Patient remained confused. Patient was brought in for evaluation on 08/26/2013 and readmitted. Head CT - Intraparenchymal hematoma in the right basal ganglia/to Lamb 8 region with intraventricular hemorrhage bilaterally.  She has afib. She is on coumadin. Just discharged on 08/25/2013. INR: 3.20  (2.80 on 08/26/13)  Date last known well: Date: 08/25/2013  Time last known well: Time: 21:00  tPA Given: No: ICH  Patient was not a TPA candidate secondary to ICH. She was admitted to the neuro ICU for further evaluation and treatment.  SUBJECTIVE The patient's daughters in the room this morning. The patient is uncomfortable secondary to bilateral lower extremity pain. She does have arthritis in her knees and she injured her left ankle when she fell. She expresses pain whenever her lower extremities are touched. Her calfs are soft and did not appear to be inflamed. DVT is doubtful at this time; however, the patient has not been wearing her compression hose because of her discomfort. She has received some Tylenol. I will ask nursing to reapply the SCDs.  OBJECTIVE Most recent Vital Signs: Filed Vitals:   08/29/13 1807 08/30/13 0218 08/30/13 0520 08/30/13 0940  BP: 137/64 132/64 158/68 155/64  Pulse: 61 61 64 64  Temp: 97.4 F (36.3 C) 97.6 F (36.4 C) 97.5 F (36.4 C) 98.1 F (36.7 C)  TempSrc: Oral Oral Oral Oral  Resp: 18 18 20  20   Height:      Weight:      SpO2: 97% 95% 98% 98%   CBG (last 3)   Recent Labs  08/29/13 1643 08/29/13 2155 08/30/13 0649  GLUCAP 134* 151* 80    IV Fluid Intake:     MEDICATIONS  . insulin aspart  0-15 Units Subcutaneous TID WC  . levothyroxine  75 mcg Oral QAC breakfast  .  morphine injection  1 mg Intravenous Once  . pantoprazole  40 mg Oral Q0600  . pneumococcal 23 valent vaccine  0.5 mL Intramuscular Tomorrow-1000  . predniSONE  15 mg Oral Q breakfast  . senna-docusate  1 tablet Oral BID  . simvastatin  20 mg Oral q1800   PRN:  acetaminophen, acetaminophen, labetalol  Diet:  Dysphagia  3 Thin liquids Activity:  Ambulate with assistance DVT Prophylaxis:  SCD  CLINICALLY SIGNIFICANT STUDIES Basic Metabolic Panel:   Recent Labs Lab 08/26/13 1655 08/30/13 0628  NA 137 138  K 3.6 4.4  CL 95* 100  CO2 28 27  GLUCOSE 114* 78  BUN 10 18  CREATININE 0.70 0.61  CALCIUM 8.9 8.9   Liver Function Tests:   Recent Labs Lab 08/26/13 0319 08/30/13 0628  AST 31 17  ALT 16 29  ALKPHOS 68 44  BILITOT 0.3 0.4  PROT 7.9 6.2  ALBUMIN 4.2 3.2*   CBC:   Recent Labs Lab 08/26/13 0319 08/30/13 0628  WBC 8.8 8.4  NEUTROABS 5.9  --  HGB 12.6 10.7*  HCT 36.8 30.5*  MCV 88.5 89.2  PLT 266 278   Coagulation:   Recent Labs Lab 08/26/13 1655 08/26/13 2237 08/27/13 0335 08/28/13 0557  LABPROT 13.6 13.7 13.4 14.1  INR 1.06 1.07 1.04 1.11   Cardiac Enzymes:   Recent Labs Lab 08/26/13 0319  TROPONINI <0.30   Urinalysis:  No results found for this basename: COLORURINE, APPERANCEUR, LABSPEC, PHURINE, GLUCOSEU, HGBUR, BILIRUBINUR, KETONESUR, PROTEINUR, UROBILINOGEN, NITRITE, LEUKOCYTESUR,  in the last 168 hours Lipid Panel    Component Value Date/Time   CHOL 122 10/15/2012 1406   TRIG 189* 10/15/2012 1406   HDL 34* 10/15/2012 1406   CHOLHDL 3.6 10/15/2012 1406   VLDL 38 10/15/2012 1406   LDLCALC 50 10/15/2012 1406   HgbA1C  Lab Results  Component  Value Date   HGBA1C 5.6 08/26/2013    Urine Drug Screen:   No results found for this basename: labopia,  cocainscrnur,  labbenz,  amphetmu,  thcu,  labbarb    Alcohol Level: No results found for this basename: ETH,  in the last 168 hours  Ct Head (brain) Wo Contrast 08/26/2013    Intraparenchymal hematoma in the right basal ganglia/to Lamb 8 region with intraventricular hemorrhage bilaterally. No significant mass effect or midline shift. 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 The pattern bleeding does not appear to represent hemorrhage due to the aneurysm.    MRI of the brain   08/26/2013 The right intraventricular hemorrhage is again seen, measuring 2.1 x 4.1 x 2.3 cm. Midline shift measures 5 mm at the level of the hemorrhage. There is mass effect on the right thalamus with edematous changes. No acute infarct is present. Diffusion signal abnormality at the periphery of the hemorrhage is likely artifactual. Extensive periventricular and subcortical white matter disease is seen bilaterally. Multiple remote lacunar infarcts are present in the basal ganglia. And Layering blood products are present within the occipital horns of the ventricles bilaterally. The postcontrast images demonstrate no pathologic enhancement. No mass lesion is evident. 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 The A1 and M1 segments are normal. A 6.5 cm anterior communicating artery aneurysm is not significantly changed in size or morphology. The MCA bifurcations are intact. Small vessel attenuation is evident bilaterally. The right vertebral artery is the dominant vessel. The AICA vessels are dominant bilaterally. The basilar artery is within normal limits. Both posterior cerebral arteries originate from the basilar  tip. There is attenuation of distal PCA branch vessels bilaterally.  2D Echocardiogram  - not indicated  Carotid Doppler  - not indicated  CXR    EKG    Therapy Recommendations - skilled nursing facility  Physical Exam   HEENT- Normocephalic, no lesions, without obvious abnormality Cardiovascular - regular Neurologic Examination:  Mental Status:  Alert, oriented with cueing. Follows simple commands. Cranial Nerves:  II: Pupils equal, round, reactive to light and accommodation left gaze deviation.   III,IV, VI: ptosis not present, extra-ocular motions intact bilaterally  V,VII: left facial droop, facial light touch sensation normal bilaterally  XI: bilateral shoulder shrug  XII: midline tongue extension  Motor:  Right : Upper extremity 5/5 Left: Upper extremity 4/5  Lower extremity 5/5 Lower extremity 4/5  Normal tone Sensory: Normal touch sensation bilaterally  Deep Tendon Reflexes: 2+ and symmetric   Cerebellar: normal  FTN CV: pulses palpable throughout  BLE - tender wherever touched. No erythema, no ecchymosis, no edema except for the left ankle which was x-rayed on 08/26/2013 - fracture ruled out.   ASSESSMENT Ms. CASHLYNN YEARWOOD is a 79 y.o. female presenting with acute delirium, then fell with left hemiparesis. Imaging confirms a intraparenchymal hematoma in the right basal ganglia/to Lamb 8 region with intraventricular hemorrhage bilaterally (25cc). Infarct felt to be secondary to anticoagulation. Patient had fall; however, it is unclear if she had left sided weakness prior to fall or after fall. Patient has been on coumadin for atrial fibrillation.  On coumadin prior to admission. Now on no anticoagulants for secondary stroke prevention. Patient with resultant left hemiparesis, improved, however remains confused. Work up underway.   Atrial fibrillation  Hypertension  Diabetes mellitus   Hyperlipidemia, LDL 50, on statin  Anemia - monitor -  recheck  Monday.  Hospital day # 4  TREATMENT/PLAN  Continue no antithrombotic for secondary stroke prevention due to hemorrhage.  Reversal protocol:  Vitamin K infused. Pharmacy assisting with reversal protocol. Follow up INR 1.11  Blood pressure goal 160/90 - currently at or below goal  Zocor restarted.  SNF unless activity level improves, then CIR. Patient may be discharged once bed obtained.  Will need to add baby aspirin back after f/u scan (Monday 12/1/ 2014) to medication regimen if follow up scans negative and if remains neurologically stable since patient has afib and therefore remains a risk for embolic stroke.  Monitor lower extremity pain. Tylenol when necessary. Continue SCDs.  Delton See PA-C [facility] Pager [phone] 08/30/2013, 10:46 AM  Lesly Dukes

## 2013-08-31 ENCOUNTER — Inpatient Hospital Stay (HOSPITAL_COMMUNITY): Payer: Medicare Other

## 2013-08-31 LAB — GLUCOSE, CAPILLARY
Glucose-Capillary: 81 mg/dL (ref 70–99)
Glucose-Capillary: 119 mg/dL — ABNORMAL HIGH (ref 70–99)
Glucose-Capillary: 91 mg/dL (ref 70–99)

## 2013-08-31 LAB — CBC
HCT: 33.1 % — ABNORMAL LOW (ref 36.0–46.0)
Hemoglobin: 11.4 g/dL — ABNORMAL LOW (ref 12.0–15.0)
MCH: 31 pg (ref 26.0–34.0)
MCHC: 34.4 g/dL (ref 30.0–36.0)
MCV: 89.9 fL (ref 78.0–100.0)
Platelets: 333 10*3/uL (ref 150–400)
RBC: 3.68 MIL/uL — ABNORMAL LOW (ref 3.87–5.11)
RDW: 15.1 % (ref 11.5–15.5)
WBC: 10 10*3/uL (ref 4.0–10.5)

## 2013-08-31 LAB — BASIC METABOLIC PANEL
CO2: 25 mEq/L (ref 19–32)
Calcium: 9 mg/dL (ref 8.4–10.5)
Creatinine, Ser: 0.69 mg/dL (ref 0.50–1.10)
GFR calc Af Amer: >90 mL/min (ref >90)
GFR calc non Af Amer: 81 mL/min — ABNORMAL LOW (ref >90)
Glucose, Bld: 192 mg/dL — ABNORMAL HIGH (ref 70–99)
Sodium: 132 mEq/L — ABNORMAL LOW (ref 135–145)

## 2013-08-31 NOTE — Progress Notes (Signed)
Stroke Team Progress Note  HISTORY  Kristin Logan is a 79 y.o. female who was just discharged from the hospital on 08/25/2013 after being admitted for hyponatremia. Patient had been fine that afternoon and went to bed at about 2100. At about 2230 the patient was up to go to the bathroom and seemed to have some confusion but was ambulatory. At about 0130 the patient was found on the floor after falling backward. It seemed she had fallen on her left. She was not aware of what was going on. She had urinated on herself. She also seemed to have some left sided weakness. Patient remained confused. Patient was brought in for evaluation on 08/26/2013 and readmitted. Head CT - Intraparenchymal hematoma in the right basal ganglia/to Lamb 8 region with intraventricular hemorrhage bilaterally.  She has afib. She is on coumadin. Just discharged on 08/25/2013. INR: 3.20  (2.80 on 08/26/13)  Date last known well: Date: 08/25/2013  Time last known well: Time: 21:00  tPA Given: No: ICH  Patient was not a TPA candidate secondary to ICH. She was admitted to the neuro ICU for further evaluation and treatment.  SUBJECTIVE  The patient has no complaints. The family indicates that the patient has become more drowsy today, seems more confused. Oral intake of food and fluids has dropped off.  OBJECTIVE Most recent Vital Signs: Filed Vitals:   08/30/13 2127 08/31/13 0218 08/31/13 0551 08/31/13 0930  BP: 162/66 171/75 178/95 137/63  Pulse: 60 60 56 63  Temp: 97.7 F (36.5 C) 98 F (36.7 C) 98.2 F (36.8 C) 97.5 F (36.4 C)  TempSrc: Oral Oral Oral Oral  Resp: 18 20 20 20   Height:      Weight:      SpO2: 99% 98% 98% 99%   CBG (last 3)   Recent Labs  08/30/13 1640 08/30/13 2124 08/31/13 0818  GLUCAP 137* 107* 81    IV Fluid Intake:     MEDICATIONS  . insulin aspart  0-15 Units Subcutaneous TID WC  . levothyroxine  75 mcg Oral QAC breakfast  .  morphine injection  1 mg Intravenous Once  .  pantoprazole  40 mg Oral Q0600  . pneumococcal 23 valent vaccine  0.5 mL Intramuscular Tomorrow-1000  . predniSONE  15 mg Oral Q breakfast  . senna-docusate  1 tablet Oral BID  . simvastatin  20 mg Oral q1800   PRN:  acetaminophen, acetaminophen, labetalol  Diet:  Dysphagia  3 Thin liquids Activity:  Ambulate with assistance DVT Prophylaxis:  SCD  CLINICALLY SIGNIFICANT STUDIES Basic Metabolic Panel:   Recent Labs Lab 08/26/13 1655 08/30/13 0628  NA 137 138  K 3.6 4.4  CL 95* 100  CO2 28 27  GLUCOSE 114* 78  BUN 10 18  CREATININE 0.70 0.61  CALCIUM 8.9 8.9   Liver Function Tests:   Recent Labs Lab 08/26/13 0319 08/30/13 0628  AST 31 17  ALT 16 29  ALKPHOS 68 44  BILITOT 0.3 0.4  PROT 7.9 6.2  ALBUMIN 4.2 3.2*   CBC:   Recent Labs Lab 08/26/13 0319 08/30/13 0628  WBC 8.8 8.4  NEUTROABS 5.9  --   HGB 12.6 10.7*  HCT 36.8 30.5*  MCV 88.5 89.2  PLT 266 278   Coagulation:   Recent Labs Lab 08/26/13 1655 08/26/13 2237 08/27/13 0335 08/28/13 0557  LABPROT 13.6 13.7 13.4 14.1  INR 1.06 1.07 1.04 1.11   Cardiac Enzymes:   Recent Labs Lab 08/26/13 0319  TROPONINI <  0.30   Urinalysis:  No results found for this basename: COLORURINE, APPERANCEUR, LABSPEC, PHURINE, GLUCOSEU, HGBUR, BILIRUBINUR, KETONESUR, PROTEINUR, UROBILINOGEN, NITRITE, LEUKOCYTESUR,  in the last 168 hours Lipid Panel    Component Value Date/Time   CHOL 122 10/15/2012 1406   TRIG 189* 10/15/2012 1406   HDL 34* 10/15/2012 1406   CHOLHDL 3.6 10/15/2012 1406   VLDL 38 10/15/2012 1406   LDLCALC 50 10/15/2012 1406   HgbA1C  Lab Results  Component Value Date   HGBA1C 5.6 08/26/2013    Urine Drug Screen:   No results found for this basename: labopia,  cocainscrnur,  labbenz,  amphetmu,  thcu,  labbarb    Alcohol Level: No results found for this basename: ETH,  in the last 168 hours  Ct Head (brain) Wo Contrast 08/26/2013    Intraparenchymal hematoma in the right basal ganglia/to  Lamb 8 region with intraventricular hemorrhage bilaterally. No significant mass effect or midline shift. 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 The pattern bleeding does not appear to represent hemorrhage due to the aneurysm.    MRI of the brain   08/26/2013 The right intraventricular hemorrhage is again seen, measuring 2.1 x 4.1 x 2.3 cm. Midline shift measures 5 mm at the level of the hemorrhage. There is mass effect on the right thalamus with edematous changes. No acute infarct is present. Diffusion signal abnormality at the periphery of the hemorrhage is likely artifactual. Extensive periventricular and subcortical white matter disease is seen bilaterally. Multiple remote lacunar infarcts are present in the basal ganglia. And Layering blood products are present within the occipital horns of the ventricles bilaterally. The postcontrast images demonstrate no pathologic enhancement. No mass lesion is evident. 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 The A1 and M1 segments are normal. A 6.5 cm anterior communicating artery aneurysm is not significantly changed in size or morphology. The MCA bifurcations are intact. Small vessel attenuation is evident bilaterally. The right vertebral artery is the dominant vessel. The AICA vessels are dominant bilaterally. The basilar artery is within normal limits. Both posterior cerebral arteries originate from the basilar tip. There is attenuation of distal PCA branch vessels bilaterally.  2D Echocardiogram  - not indicated  Carotid Doppler  - not indicated  CXR    EKG  08/26/2013 -   Therapy Recommendations - skilled nursing facility  Physical Exam  General: The patient is alert and cooperative at the time of the examination. The patient is moderately  obese.  Respiratory: Lungs fields are clear.  Cardiovascular: Cardiovascular examination reveals an occasional irregular rhythm without heart murmurs.  Abdomen: Abdomen is soft, nontender.  Skin: No significant peripheral edema is noted.   Neurologic Exam  Mental status: The patient is sleepy, but can be aroused easily.  Cranial nerves: Facial symmetry is present. Speech is normal, no aphasia or dysarthria is noted. Extraocular movements are full. Visual fields are full.  Motor: The patient has good strength in all 4 extremities.  Sensory examination: Soft touch sensation on the face, arms, and legs is symmetric.  Coordination: The patient has good finger-nose-finger and heel-to-shin bilaterally.  Gait and station: The gait was not tested. No drift is seen.  Reflexes: Deep tendon reflexes are symmetric, but are depressed.    ASSESSMENT Kristin Logan is a 79 y.o. female presenting with  acute delirium, then fell with left hemiparesis. Imaging confirms a intraparenchymal hematoma in the right basal ganglia/to Lamb 8 region with intraventricular hemorrhage bilaterally (25cc). Infarct felt to be secondary to anticoagulation. Patient had fall; however, it is unclear if she had left sided weakness prior to fall or after fall. Patient has been on coumadin for atrial fibrillation.  On coumadin prior to admission. Now on no anticoagulants for secondary stroke prevention. Patient with resultant left hemiparesis, improved, however remains confused. Work up underway.   Atrial fibrillation  Hypertension  Diabetes mellitus   Hyperlipidemia, LDL 50, on statin  Anemia - monitor -  recheck Monday.  Hospital day # 5  Family has some concerns of increased drowsiness, and more confusion. We'll check CT the head today, blood work.  TREATMENT/PLAN  Continue no antithrombotic for secondary stroke prevention due to hemorrhage.  Reversal protocol:  Vitamin K infused. Pharmacy  assisting with reversal protocol. Follow up INR 1.11  Blood pressure goal 160/90 - currently at or below goal  Zocor restarted.  SNF unless activity level improves, then CIR. Patient may be discharged once bed obtained.  Will need to add baby aspirin back after followup CT, we'll check CT today.    Monitor lower extremity pain. Tylenol when necessary. Continue SCDs.  Blood work today.  Delton See PA-C [facility] Pager [phone] 08/31/2013, 11:42 AM

## 2013-09-01 DIAGNOSIS — I639 Cerebral infarction, unspecified: Secondary | ICD-10-CM

## 2013-09-01 DIAGNOSIS — Z79899 Other long term (current) drug therapy: Secondary | ICD-10-CM

## 2013-09-01 HISTORY — DX: Cerebral infarction, unspecified: I63.9

## 2013-09-01 LAB — GLUCOSE, CAPILLARY

## 2013-09-01 MED ORDER — ATORVASTATIN CALCIUM 20 MG PO TABS: 20.0000 mg | ORAL_TABLET | Freq: Every day | ORAL | Status: DC

## 2013-09-01 MED ORDER — ASPIRIN EC 81 MG PO TBEC: 81.0000 mg | DELAYED_RELEASE_TABLET | Freq: Every day | ORAL | Status: DC

## 2013-09-01 NOTE — Clinical Social Work Note (Signed)
[facility] has been called for pt's transport to [facility]. CSW has completed pt's discharge packet and placed on pt's shadow chart.  Darlyn Chamber, LCSWA Clinical Social Worker [phone]

## 2013-09-01 NOTE — Progress Notes (Signed)
Physical Therapy Treatment Patient Details Name: Kristin Logan MRN: [id number] DOB: 20-Jun-1932 Today's Date: 09/01/2013 Time: 4098-1191 PT Time Calculation (min): 16 min  PT Assessment / Plan / Recommendation  History of Present Illness 79 y.o. female with a history of hypertension, atrial fibrillation, status post aVR with bioprosthetic device, on chronic anticoagulation with Coumadin, hypothyroidism, depression who came to the emergency department today with the chief complaint of one-week history of progressive worsening weakness. She's been admitted with generalized weakness, decreased appetite and some hyponatremia. Started IV fluids.  She lives at home with her son and daugther in law and requires assistance with RW.    PT Comments   Pt agreeable to mobility, but needs encouragement to increase activity.  Will continue to follow.    Follow Up Recommendations  SNF     Does the patient have the potential to tolerate intense rehabilitation     Barriers to Discharge        Equipment Recommendations  None recommended by PT    Recommendations for Other Services    Frequency Min 3X/week   Progress towards PT Goals Progress towards PT goals: Progressing toward goals  Plan Current plan remains appropriate    Precautions / Restrictions Precautions Precautions: Fall Precaution Comments: B painful knees Restrictions Weight Bearing Restrictions: No   Pertinent Vitals/Pain Indicates Bil knees "ache".      Mobility  Bed Mobility Bed Mobility: Supine to Sit;Sitting - Scoot to Edge of Bed Supine to Sit: 2: Max assist Sitting - Scoot to [place] of Bed: 2: Max assist Details for Bed Mobility Assistance: cues for sequencing, encouragement, and safe technique.   Transfers Transfers: Sit to Stand;Stand to Sit;Stand Pivot Transfers Sit to Stand: 1: +2 Total assist;With upper extremity assist;From bed Sit to Stand: Patient Percentage: 50% Stand to Sit: 1: +2 Total assist;With upper  extremity assist;To bed;To chair/3-in-1 Stand to Sit: Patient Percentage: 50% Stand Pivot Transfers: 1: +2 Total assist Stand Pivot Transfers: Patient Percentage: 40% Details for Transfer Assistance: cues for step-by-step through transfer and for encouragement as pt wants to sit right back down 2/2 fatigue.  Repeated transfers x3 for strengthening and activity tolerance.   Ambulation/Gait Ambulation/Gait Assistance: Not tested (comment) Stairs: No Wheelchair Mobility Wheelchair Mobility: No Modified Rankin (Stroke Patients Only) Pre-Morbid Rankin Score: Moderate disability Modified Rankin: Severe disability    Exercises     PT Diagnosis:    PT Problem List:   PT Treatment Interventions:     PT Goals (current goals can now be found in the care plan section) Acute Rehab PT Goals Potential to Achieve Goals: Good  Visit Information  Last PT Received On: 09/01/13 Assistance Needed: +2 History of Present Illness: 78 y.o. female with a history of hypertension, atrial fibrillation, status post aVR with bioprosthetic device, on chronic anticoagulation with Coumadin, hypothyroidism, depression who came to the emergency department today with the chief complaint of one-week history of progressive worsening weakness. She's been admitted with generalized weakness, decreased appetite and some hyponatremia. Started IV fluids.  She lives at home with her son and daugther in law and requires assistance with RW.     Subjective Data      Cognition  Cognition Arousal/Alertness: Awake/alert Behavior During Therapy: WFL for tasks assessed/performed Overall Cognitive Status: Impaired/Different from baseline Area of Impairment: Problem solving;Memory;Awareness;Safety/judgement Memory: Decreased short-term memory Safety/Judgement: Decreased awareness of safety;Decreased awareness of deficits Awareness: Emergent Problem Solving: Slow processing;Requires verbal cues;Requires tactile cues    Balance   Balance Balance Assessed:  No  End of Session PT - End of Session Equipment Utilized During Treatment: Gait belt Activity Tolerance: Patient limited by fatigue Patient left: in chair;with call bell/phone within reach;with family/visitor present Nurse Communication: Mobility status   GP     Sunny Schlein, [place] [phone] 09/01/2013, 1:15 PM

## 2013-09-01 NOTE — Progress Notes (Addendum)
Stroke Team Progress Note  HISTORY  Kristin Logan is a 79 y.o. female who was just discharged from the hospital on 08/25/2013 after being admitted for hyponatremia. Patient had been fine that afternoon and went to bed at about 2100. At about 2230 the patient was up to go to the bathroom and seemed to have some confusion but was ambulatory. At about 0130 the patient was found on the floor after falling backward. It seemed she had fallen on her left. She was not aware of what was going on. She had urinated on herself. She also seemed to have some left sided weakness. Patient remained confused. Patient was brought in for evaluation on 08/26/2013 and readmitted. Head CT - Intraparenchymal hematoma in the right basal ganglia/to Lamb 8 region with intraventricular hemorrhage bilaterally.  She has afib. She is on coumadin. Just discharged on 08/25/2013. INR: 3.20  (2.80 on 08/26/13)  Date last known well: Date: 08/25/2013  Time last known well: Time: 21:00  tPA Given: No: ICH  Patient was not a TPA candidate secondary to ICH. She was admitted to the neuro ICU for further evaluation and treatment.  SUBJECTIVE  The patient is feeling better. Able to answer direct questions with good long term memory recall. For SNF today. Family in room.   OBJECTIVE Most recent Vital Signs: Filed Vitals:   08/31/13 1554 08/31/13 2201 09/01/13 0520 09/01/13 1015  BP: 139/58 152/86 167/81 124/61  Pulse: 74 71 62 54  Temp: 98 F (36.7 C) 98.7 F (37.1 C) 97.5 F (36.4 C) 97.1 F (36.2 C)  TempSrc: Oral Oral Oral Oral  Resp: 18 18 20 20   Height:      Weight:      SpO2: 98% 95% 99% 97%   CBG (last 3)   Recent Labs  08/31/13 1552 08/31/13 2154 09/01/13 0656  GLUCAP 119* 91 64*    IV Fluid Intake:     MEDICATIONS  . insulin aspart  0-15 Units Subcutaneous TID WC  . levothyroxine  75 mcg Oral QAC breakfast  . pantoprazole  40 mg Oral Q0600  . pneumococcal 23 valent vaccine  0.5 mL Intramuscular  Tomorrow-1000  . predniSONE  15 mg Oral Q breakfast  . senna-docusate  1 tablet Oral BID  . simvastatin  20 mg Oral q1800   PRN:  acetaminophen, acetaminophen, labetalol  Diet:  Dysphagia  3 Thin liquids Activity:  Ambulate with assistance DVT Prophylaxis:  SCD  CLINICALLY SIGNIFICANT STUDIES Basic Metabolic Panel:   Recent Labs Lab 08/30/13 0628 08/31/13 1330  NA 138 132*  K 4.4 4.3  CL 100 93*  CO2 27 25  GLUCOSE 78 192*  BUN 18 19  CREATININE 0.61 0.69  CALCIUM 8.9 9.0   Liver Function Tests:   Recent Labs Lab 08/26/13 0319 08/30/13 0628  AST 31 17  ALT 16 29  ALKPHOS 68 44  BILITOT 0.3 0.4  PROT 7.9 6.2  ALBUMIN 4.2 3.2*   CBC:   Recent Labs Lab 08/26/13 0319 08/30/13 0628 08/31/13 1700  WBC 8.8 8.4 10.0  NEUTROABS 5.9  --   --   HGB 12.6 10.7* 11.4*  HCT 36.8 30.5* 33.1*  MCV 88.5 89.2 89.9  PLT 266 278 333   Coagulation:   Recent Labs Lab 08/26/13 1655 08/26/13 2237 08/27/13 0335 08/28/13 0557  LABPROT 13.6 13.7 13.4 14.1  INR 1.06 1.07 1.04 1.11   Cardiac Enzymes:   Recent Labs Lab 08/26/13 0319  TROPONINI <0.30   Urinalysis:  No results found for this basename: COLORURINE, APPERANCEUR, LABSPEC, PHURINE, GLUCOSEU, HGBUR, BILIRUBINUR, KETONESUR, PROTEINUR, UROBILINOGEN, NITRITE, LEUKOCYTESUR,  in the last 168 hours Lipid Panel    Component Value Date/Time   CHOL 122 10/15/2012 1406   TRIG 189* 10/15/2012 1406   HDL 34* 10/15/2012 1406   CHOLHDL 3.6 10/15/2012 1406   VLDL 38 10/15/2012 1406   LDLCALC 50 10/15/2012 1406   HgbA1C  Lab Results  Component Value Date   HGBA1C 5.6 08/26/2013    Urine Drug Screen:   No results found for this basename: labopia,  cocainscrnur,  labbenz,  amphetmu,  thcu,  labbarb    Alcohol Level: No results found for this basename: ETH,  in the last 168 hours  Ct Head (brain) Wo Contrast 08/26/2013    Intraparenchymal hematoma in the right basal ganglia/to Lamb 8 region with intraventricular  hemorrhage bilaterally. No significant mass effect or midline shift. 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 The pattern bleeding does not appear to represent hemorrhage due to the aneurysm.    MRI of the brain   08/26/2013 The right intraventricular hemorrhage is again seen, measuring 2.1 x 4.1 x 2.3 cm. Midline shift measures 5 mm at the level of the hemorrhage. There is mass effect on the right thalamus with edematous changes. No acute infarct is present. Diffusion signal abnormality at the periphery of the hemorrhage is likely artifactual. Extensive periventricular and subcortical white matter disease is seen bilaterally. Multiple remote lacunar infarcts are present in the basal ganglia. And Layering blood products are present within the occipital horns of the ventricles bilaterally. The postcontrast images demonstrate no pathologic enhancement. No mass lesion is evident. 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 The A1 and M1 segments are normal. A 6.5 cm anterior communicating artery aneurysm is not significantly changed in size or morphology. The MCA bifurcations are intact. Small vessel attenuation is evident bilaterally. The right vertebral artery is the dominant vessel. The AICA vessels are dominant bilaterally. The basilar artery is within normal limits. Both posterior cerebral arteries originate from the basilar tip. There is attenuation of distal PCA branch vessels bilaterally.  2D Echocardiogram  - not indicated  Carotid Doppler  - not indicated  CXR    EKG  08/26/2013 -   Therapy Recommendations - skilled nursing facility  Physical Exam  General: The patient is alert and cooperative at the time of the examination. The patient is moderately obese.  Respiratory: Lungs fields  are clear.  Cardiovascular: Cardiovascular examination reveals an occasional irregular rhythm without heart murmurs.  Abdomen: Abdomen is soft, nontender.  Skin: No significant peripheral edema is noted.   Neurologic Exam  Mental status: The patient is awake and interactive  Cranial nerves: Facial symmetry is present. Speech is normal, no aphasia or dysarthria is noted. Extraocular movements are full. Visual fields are full.  Motor: The patient has good strength in all 4 extremities.  Sensory examination:   touch sensation on the face, arms, and legs is symmetric.  Coordination: The patient has good finger-nose-finger and heel-to-shin bilaterally.  Gait and station: The gait was not tested. No drift is seen.  Reflexes: Deep tendon reflexes are symmetric, but are depressed.    ASSESSMENT Ms. BRITTNI HULT is a 79 y.o. female presenting with acute delirium, then fell with left hemiparesis.  Imaging confirms a intraparenchymal hematoma in the right basal ganglia/to Lamb 8 region with intraventricular hemorrhage bilaterally (25cc). Brain hemorrhage felt to be secondary to anticoagulation. Patient had fall; however, it is unclear if she had left sided weakness prior to fall or after fall. Patient has been on coumadin for atrial fibrillation.  On coumadin prior to admission. Now on no anticoagulants for secondary stroke prevention. Patient with resultant left hemiparesis, improved, however remains confused.    Atrial fibrillation  Hypertension  Diabetes mellitus, HGB A1C  5.6  Hyperlipidemia, LDL 50, on statin at goal, zocor  Hospital day # 6  TREATMENT/PLAN  Add baby aspirin 81mg  daily for secondary stroke prevention as hemorrhage is stable on CT. Patient has afib. Patient has history of ulcer on NSAIDS in past; however no bleeding with coumadin in GI tract. Will add PPI. Patient and family understand agree with starting low dose aspirin as patient needs to attempt to have  some type of antithrombotic given afib. She is no longer a coumadin candidate due to ICH.  Reversal protocol:  Vitamin K infused. Pharmacy assisting with reversal protocol. Follow up INR 1.11  Blood pressure goal 160/90 - currently at or below goal  SNF today   Have patient to follow up with Dr. Pearlean Brownie in 2 months in stroke clinic  Larita Fife D. Manson Passey, [facility], MBA, MHA [facility] Pager: [phone] 09/01/2013 12:39 PM  I have personally obtained a history, examined the patient, evaluated imaging results, and formulated the assessment and plan of care. I agree with the above.  Delia Heady, MD

## 2013-09-01 NOTE — Discharge Summary (Signed)
Stroke Discharge Summary  Patient ID: Kristin Logan   MRN: [id number]      DOB: 07/24/1932  Date of Admission: 08/26/2013 Date of Discharge: 09/01/2013  Attending Physician:  Darcella Cheshire, MD, Stroke MD  Consulting Physician(s):     rehabilitation medicine  Patient's PCP:  [name], MD  Discharge Diagnoses:  Principal Problem:   ICH (intracerebral hemorrhage) likely warfarin related Active Problems:   DIABETES MELLITUS, TYPE II   HYPERLIPIDEMIA   HYPERTENSION   Atrial fibrillation, chronic  6.5 mm asymptomatic anterior communicating artery  brain aneurysm   Intraparenchymal hemorrhage of brain   Encounter for long-term (current) use of high-risk medication  BMI: Body mass index is 30.23 kg/(m^2).  Past Medical History  Diagnosis Date  . Diabetes mellitus   . Hyperlipidemia   . Eosinophilia   . Cataract   . Hypertension   . Atrial fibrillation prior to 2008    moderate biatrial enlargement in 2009; mild to moderate LVH with a low-normal EF  . GERD (gastroesophageal reflux disease)     Hemorrhoids; history of peptic ulcer disease  . DJD (degenerative joint disease) of knee   . Chronic anticoagulation   . Aortic valve disease     2009: mild stenosis and regurgitation; peak gradient of 30-35 mmHg , subsequent AVR at [facility]  . Incontinence     Mild  . Hip fracture, left 12/12/2012  . Foot injury 06/25/2012  . Degenerative joint disease of knee 01/29/2008    Bilateral    . BREAST CANCER, HX OF 01/29/2008    Lumpectomy, radiation therapy  Arimidex stopped in July 2013    . Syncope and collapse 11/11/2012  . Diverticulosis   . Umbilical hernia   . Cholelithiasis   . DDD (degenerative disc disease), lumbar   . Vertebral compression fracture     lumbar  . Chronic pain   . Paresthesias     feet  . Gastroparesis   . Sleep apnea     CPAP  . Carcinoma of breast     Right mastectomy; radiation and hormonal therapy   Past Surgical History  Procedure  Laterality Date  . Exploration post operative open heart    . Colonoscopy      Approximately 2000  . Cardiac valve replacement      [facility]  . Cardiac valve replacement    . Breast surgery    . Breast lumpectomy    . Esophagogastroduodenoscopy N/A 02/20/2013    Procedure: ESOPHAGOGASTRODUODENOSCOPY (EGD);  Surgeon: Malissa Hippo, MD;  Location: [facility];  Service: Endoscopy;  Laterality: N/A;  . Mastectomy        Medication List    STOP taking these medications       amLODipine 5 MG tablet  Commonly known as:  NORVASC     CENTRUM SILVER ULTRA WOMENS PO     diclofenac sodium 1 % Gel  Commonly known as:  VOLTAREN     glycerin adult 2 G Supp  Commonly known as:  glycerin adult     HYDROcodone-acetaminophen 5-325 MG per tablet  Commonly known as:  NORCO/VICODIN     warfarin 5 MG tablet  Commonly known as:  COUMADIN      TAKE these medications       acetaminophen 650 MG CR tablet  Commonly known as:  TYLENOL  Take 1 tablet (650 mg total) by mouth every 8 (eight) hours as needed for pain.     albuterol  108 (90 BASE) MCG/ACT inhaler  Commonly known as:  PROVENTIL HFA;VENTOLIN HFA  Inhale 2 puffs into the lungs every 4 (four) hours as needed for wheezing.     albuterol (2.5 MG/3ML) 0.083% nebulizer solution  Commonly known as:  PROVENTIL  Take 3 mLs (2.5 mg total) by nebulization every 6 (six) hours as needed for wheezing.     aspirin EC 81 MG tablet  Take 1 tablet (81 mg total) by mouth daily.     clotrimazole-betamethasone cream  Commonly known as:  LOTRISONE  Apply 1 application topically 2 (two) times daily.     levothyroxine 75 MCG tablet  Commonly known as:  SYNTHROID, LEVOTHROID  Take 75 mcg by mouth daily before breakfast.     omeprazole 20 MG capsule  Commonly known as:  PRILOSEC  Take 1 capsule (20 mg total) by mouth 2 (two) times daily.     predniSONE 5 MG tablet  Commonly known as:  DELTASONE  Take 15 mg by mouth daily with breakfast.      simvastatin 20 MG tablet  Commonly known as:  ZOCOR  Take 1 tablet (20 mg total) by mouth at bedtime.        LABORATORY STUDIES CBC    Component Value Date/Time   WBC 10.0 08/31/2013 1700   RBC 3.68* 08/31/2013 1700   HGB 11.4* 08/31/2013 1700   HCT 33.1* 08/31/2013 1700   PLT 333 08/31/2013 1700   MCV 89.9 08/31/2013 1700   MCH 31.0 08/31/2013 1700   MCHC 34.4 08/31/2013 1700   RDW 15.1 08/31/2013 1700   LYMPHSABS 2.1 08/26/2013 0319   MONOABS 0.7 08/26/2013 0319   EOSABS 0.0 08/26/2013 0319   BASOSABS 0.0 08/26/2013 0319   CMP    Component Value Date/Time   NA 132* 08/31/2013 1330   K 4.3 08/31/2013 1330   CL 93* 08/31/2013 1330   CO2 25 08/31/2013 1330   GLUCOSE 192* 08/31/2013 1330   BUN 19 08/31/2013 1330   CREATININE 0.69 08/31/2013 1330   CREATININE 0.54 05/27/2013 1355   CALCIUM 9.0 08/31/2013 1330   PROT 6.2 08/30/2013 0628   ALBUMIN 3.2* 08/30/2013 0628   AST 17 08/30/2013 0628   ALT 29 08/30/2013 0628   ALKPHOS 44 08/30/2013 0628   BILITOT 0.4 08/30/2013 0628   GFRNONAA 81* 08/31/2013 1330   GFRAA >90 08/31/2013 1330   COAGS Lab Results  Component Value Date   INR 1.11 08/28/2013   INR 1.04 08/27/2013   INR 1.07 08/26/2013   Lipid Panel    Component Value Date/Time   CHOL 122 10/15/2012 1406   TRIG 189* 10/15/2012 1406   HDL 34* 10/15/2012 1406   CHOLHDL 3.6 10/15/2012 1406   VLDL 38 10/15/2012 1406   LDLCALC 50 10/15/2012 1406   HgbA1C  Lab Results  Component Value Date   HGBA1C 5.6 08/26/2013   Cardiac Panel (last 3 results) No results found for this basename: CKTOTAL, CKMB, TROPONINI, RELINDX,  in the last 72 hours Urinalysis    Component Value Date/Time   COLORURINE YELLOW 08/22/2013 0801   APPEARANCEUR CLEAR 08/22/2013 0801   LABSPEC <1.005* 08/22/2013 0801   PHURINE 6.0 08/22/2013 0801   GLUCOSEU NEGATIVE 08/22/2013 0801   HGBUR NEGATIVE 08/22/2013 0801   BILIRUBINUR NEGATIVE 08/22/2013 0801   BILIRUBINUR neg 03/26/2012 1517    KETONESUR NEGATIVE 08/22/2013 0801   PROTEINUR NEGATIVE 08/22/2013 0801   UROBILINOGEN 0.2 08/22/2013 0801   UROBILINOGEN 0.2 03/26/2012 1517   NITRITE NEGATIVE 08/22/2013 0801  NITRITE neg 03/26/2012 1517   LEUKOCYTESUR NEGATIVE 08/22/2013 0801   Urine Drug Screen  No results found for this basename: labopia, cocainscrnur, labbenz, amphetmu, thcu, labbarb    Alcohol Level No results found for this basename: eth     SIGNIFICANT DIAGNOSTIC STUDIES  Ct Head (brain) Wo Contrast  08/26/2013  Intraparenchymal hematoma in the right basal ganglia/to Lamb 8 region with intraventricular hemorrhage bilaterally. No significant mass effect or midline shift. 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 The pattern bleeding does not appear to represent hemorrhage due to the aneurysm.   MRI of the brain  08/26/2013  The right intraventricular hemorrhage is again seen, measuring 2.1 x 4.1 x 2.3 cm. Midline shift measures 5 mm at the level of the hemorrhage. There is mass effect on the right thalamus with edematous changes. No acute infarct is present. Diffusion signal abnormality at the periphery of the hemorrhage is likely artifactual. Extensive periventricular and subcortical white matter disease is seen bilaterally. Multiple remote lacunar infarcts are present in the basal ganglia. And Layering blood products are present within the occipital horns of the ventricles bilaterally. The postcontrast images demonstrate no pathologic enhancement. No mass lesion is evident. The anterior communicating artery aneurysm aneurysm is again seen   MRA of the brain  08/26/2013  The internal carotid arteries are within normal limits from the high cervical segments through the ICA termini bilaterally. The A1 and M1 segments are normal. A 6.5 cm anterior communicating artery aneurysm is not significantly changed in size or morphology. The MCA bifurcations are intact. Small vessel attenuation is  evident bilaterally. The right vertebral artery is the dominant vessel. The AICA vessels are dominant bilaterally. The basilar artery is within normal limits. Both posterior cerebral arteries originate from the basilar tip. There is attenuation of distal PCA branch vessels bilaterally.       History of Present Illness   Kristin Logan is a 79 y.o. female who was just discharged from the hospital on 08/25/2013 after being admitted for hyponatremia. Patient had been fine that afternoon and went to bed at about 2100. At about 2230 the patient was up to go to the bathroom and seemed to have some confusion but was ambulatory. At about 0130 the patient was found on the floor after falling backward. It seemed she had fallen on her left. She was not aware of what was going on. She had urinated on herself. She also seemed to have some left sided weakness. Patient remained confused. Patient was brought in for evaluation on 08/26/2013 and readmitted. Head CT - Intraparenchymal hematoma in the right basal ganglia/to Lamb 8 region with intraventricular hemorrhage bilaterally.   She has afib. She is on coumadin. Just discharged on 08/25/2013. INR: 3.20 (2.80 on 08/26/13)   Date last known well: Date: 08/25/2013  Time last known well: Time: 21:00  tPA Given: No: ICH   Patient was not a TPA candidate secondary to ICH. She was admitted to the neuro ICU for further evaluation and treatment.     Hospital Course   Kristin Logan is a 79 y.o. female presenting with acute delirium, then fell with left hemiparesis. Imaging confirms a intraparenchymal hematoma in the right basal ganglia/to Lamb 8 region with intraventricular hemorrhage bilaterally (25cc). Infarct felt to be secondary to anticoagulation. Patient had fall; however, it is unclear if she had left sided weakness prior to fall or after fall. Patient has been on coumadin for atrial fibrillation. On coumadin prior  to admission.  Patient with  resultant left hemiparesis, improved, however remains confused.  Atrial fibrillation  Hypertension  Diabetes mellitus, HGB A1C 5.6  Hyperlipidemia, LDL 50, on statin at goal, zocor Hospital day # 6  TREATMENT/PLAN  Add baby aspirin 81mg  daily for secondary stroke prevention as hemorrhage is stable on CT. Patient has afib. Patient has history of ulcer on NSAIDS in past; however no bleeding with coumadin in GI tract. Will continue prilosec as taking at home. Patient and family understand agree with starting low dose aspirin as patient needs to attempt to have some type of antithrombotic given afib. She is no longer a coumadin candidate due to ICH.  Reversal protocol: Vitamin K infused. Pharmacy assisting with reversal protocol. Follow up INR 1.11  Blood pressure goal 160/90 - currently at or below goal  SNF today  Have patient to follow up with Dr. Pearlean Brownie in 2 months in stroke clinic    Patient with vascular risk factors of:   Hypertension  Diabetes mellitus  hyperlipidemia  Patient with continued stroke symptoms of left hemiparesis. Physical therapy, occupational therapy and speech therapy evaluated patient. They recommend SNF.  Discharge Exam  Blood pressure 124/61, pulse 54, temperature 97.1 F (36.2 C), temperature source Oral, resp. rate 20, height 5\' 5"  (1.651 m), weight 82.4 kg (181 lb 10.5 oz), SpO2 97.00%.   General: The patient is alert and cooperative at the time of the examination. The patient is moderately obese.  Respiratory: Lungs fields are clear.  Cardiovascular: Cardiovascular examination reveals an occasional irregular rhythm without heart murmurs.  Abdomen: Abdomen is soft, nontender.  Skin: No significant peripheral edema is noted.  Neurologic Exam  Mental status: The patient is sleepy, but can be aroused easily.  Cranial nerves: Facial symmetry is present. Speech is normal, no aphasia or dysarthria is noted. Extraocular movements are full. Visual fields are full.   Motor: The patient has good strength in all 4 extremities.  Sensory examination: Soft touch sensation on the face, arms, and legs is symmetric.  Coordination: The patient has good finger-nose-finger and heel-to-shin bilaterally.  Gait and station: The gait was not tested. No drift is seen.  Reflexes: Deep tendon reflexes are symmetric, but are depressed.     Discharge Diet   Dysphagia 3 thin liquids  Discharge Plan    Disposition:  SNF   aspirin 81 mg orally every day for secondary stroke prevention.  Ongoing risk factor control by Primary Care Physician. Risk factor recommendations:  Hypertension target range 130-140/70-80 Lipid range - LDL < 100 and checked every 6 months, fasting Diabetes - HgB A1C <7   Follow-up [name], MD in 1 month.  Follow-up with Dr. Delia Heady, Stroke Clinic in 2 months.  35 minutes were spent preparing discharge.  Signed Gwendolyn Lima. Manson Passey, [facility], MBA, MHA [facility] Pager: [phone] 09/01/2013 1:03 PM  I have personally examined this patient, reviewed pertinent data and developed the plan of care. I agree with above.  Delia Heady, MD

## 2013-09-01 NOTE — Progress Notes (Signed)
Report given to Josephina Gip, RN from [facility] facility.

## 2013-09-01 NOTE — Clinical Social Work Note (Addendum)
CSW met with pt's son and daughter-in-law at pt's bedside. Pt's son and daughter-in-law stated that their first choice for SNF placement is [facility] [phone]). CSW discussed with family about having a second SNF placement choice in the event that [facility] cannot accept pt there. Family stated that they currently did not have a second choice, but would be willing to go today (09/01/2013) to tour facilities that have offered bed placement. CSW contacted admissions coordinator at [facility] to confirm bed offer. Admissions coordinator stated that she would look over pt's clinical information and contact CSW once a decision has been made.  11:10am  CSW received call from [facility] regarding placement for today (09/01/2013). [facility] is able to accept pt at their facility today. CSW spoke with pt's daughter-in-law at bedside. Pt's daughter-in-law stated that she would prefer for pt to be transported to facility by ambulance. CSW informed MD's PA of information above. CSW to continue to follow and assist with discharge planning needs.   Darlyn Chamber, LCSWA Clinical Social Worker [phone]

## 2013-09-01 NOTE — Progress Notes (Signed)
Speech Language Pathology Treatment: Cognitive-Linquistic  Patient Details Name: Kristin Logan MRN: [id number] DOB: May 09, 1932 Today's Date: 09/01/2013 Time: 7829-5621 SLP Time Calculation (min): 26 min  Assessment / Plan / Recommendation Clinical Impression  Skilled treatment session focused on addressing cognitive-linguistic goals and family education.  Patient completed new learning task that addressed sustained attention, right attention, working memory and basic problem solving.  SLP initially provided Total assist and was able to fade cues to Mod assist verbal and visual cues.  Most limiting factors were initiation, working memory and sequencing deficits that written aids and a reduced field helped compensate for.  SLP discussed cognitive tasks with family members to assist with carryover and home programs after discharge from next level of care; daughter-in-law verbalized understanding of education.     HPI HPI: Kristin Logan is a 79 y.o. female presenting with acute delirium, then fell with left hemiparesis. Imaging confirms a intraparenchymal hematoma in the right basal ganglia/to Lamb 8 region with intraventricular hemorrhage bilaterally. Infarct felt to be secondary to anticoagulation. Patient had fall; however, it is unclear if she had left sided weakness prior to fall or after fall. Patient has been on coumadin for atrial fibrillation.  On coumadin prior to admission. Now on no anticoagulants for secondary stroke prevention. Patient with resultant left hemiparesis, improved, however remains confused.    Pertinent Vitals none  SLP Plan  Continue with current plan of care    Recommendations                Oral Care Recommendations: Oral care BID Follow up Recommendations: 24 hour supervision/assistance;Skilled Nursing facility Plan: Continue with current plan of care    GO    Kristin Logan., CCC-SLP [phone]  Kristin Logan 09/01/2013, 4:31 PM

## 2013-09-01 NOTE — Progress Notes (Signed)
Rehab admissions - Evaluated for possible admission.  At this point, most appropriate for SNF.  Tolerance of activities would need to improve dramatically before CIR could be considered.  Call me for questions.  #[phone]

## 2013-09-04 ENCOUNTER — Encounter: Payer: Self-pay | Admitting: Family Medicine

## 2013-09-05 ENCOUNTER — Encounter: Payer: Self-pay | Admitting: Family Medicine

## 2013-09-29 ENCOUNTER — Encounter: Payer: Self-pay | Admitting: *Deleted

## 2013-10-01 ENCOUNTER — Ambulatory Visit (INDEPENDENT_AMBULATORY_CARE_PROVIDER_SITE_OTHER): Payer: Medicare Other | Admitting: Family Medicine

## 2013-10-01 ENCOUNTER — Encounter: Payer: Self-pay | Admitting: Family Medicine

## 2013-10-01 VITALS — BP 120/70 | HR 58 | Temp 97.9°F | Resp 20 | Wt 188.5 lb

## 2013-10-01 DIAGNOSIS — E871 Hypo-osmolality and hyponatremia: Secondary | ICD-10-CM

## 2013-10-01 DIAGNOSIS — R32 Unspecified urinary incontinence: Secondary | ICD-10-CM

## 2013-10-01 DIAGNOSIS — E119 Type 2 diabetes mellitus without complications: Secondary | ICD-10-CM

## 2013-10-01 DIAGNOSIS — I10 Essential (primary) hypertension: Secondary | ICD-10-CM

## 2013-10-01 DIAGNOSIS — E2740 Unspecified adrenocortical insufficiency: Secondary | ICD-10-CM

## 2013-10-01 DIAGNOSIS — E2749 Other adrenocortical insufficiency: Secondary | ICD-10-CM

## 2013-10-01 DIAGNOSIS — I619 Nontraumatic intracerebral hemorrhage, unspecified: Secondary | ICD-10-CM

## 2013-10-01 LAB — CBC W/MCH & 3 PART DIFF
HCT: 42 % (ref 36.0–46.0)
Lymphocytes Relative: 23 % (ref 12–46)
Lymphs Abs: 2.6 10*3/uL (ref 0.7–4.0)
MCHC: 33.1 g/dL (ref 30.0–36.0)
MCV: 94.4 fL (ref 78.0–100.0)
RDW: 15.3 % (ref 11.5–15.5)
WBC mixed population: 0.9 10*3/uL (ref 0.1–1.8)
WBC: 11.7 10*3/uL — ABNORMAL HIGH (ref 4.0–10.5)

## 2013-10-01 LAB — BASIC METABOLIC PANEL
CO2: 24 mEq/L (ref 19–32)
Calcium: 9.6 mg/dL (ref 8.4–10.5)
Chloride: 99 mEq/L (ref 96–112)
Sodium: 137 mEq/L (ref 135–145)

## 2013-10-01 MED ORDER — PREDNISONE 5 MG PO TABS: 15.0000 mg | ORAL_TABLET | Freq: Every day | ORAL | Status: DC

## 2013-10-01 NOTE — Assessment & Plan Note (Signed)
Recheck sodium levels 

## 2013-10-01 NOTE — Assessment & Plan Note (Signed)
Well controlled off meds, glucerna script given

## 2013-10-01 NOTE — Patient Instructions (Signed)
Continue current medications Glucerna okay  CAPS prescription given Change follow-up to - March 2015

## 2013-10-01 NOTE — Assessment & Plan Note (Signed)
Pullups and pads ordered through caps

## 2013-10-01 NOTE — Progress Notes (Signed)
   Subjective:    Patient ID: Kristin Logan, female    DOB: 15-Dec-1931, 79 y.o.   MRN: [id number]  HPI Patient here to followup Hospital patient rehabilitation. She was admitted for hyponatremia about 6 weeks ago she was discharged home and subsequently had a hemorrhagic stroke. She was in rehabilitation for about 3 weeks and was discharged this past weekend. She specific concerns today. Guarding or hyponatremia she does not have adrenal insufficiency after she was evaluated by endocrinology during her inpatient admission. She's been on prednisone 15 mg Pierce she's not had a repeat sodium check since her discharge from the hospital. Of note she ran out of her prednisone yesterday but she does not have an appointment with endocrinology until next week. She is currently getting rehabilitation twice a week at home. She is still very weak but is able to get up and ambulate. She's also establish with CPAP services which they will provide a caregiver during the week as well. She is followup with neurology in February for her stroke. She's no longer on Coumadin secondary to a hemorrhagic stroke but is on aspirin 81 mg. Neck she was prescribed Prozac during her rehabilitation however the family decided not to continue this.  Review of Systems  GEN- denies fatigue, fever, weight loss,weakness, recent illness HEENT- denies eye drainage, change in vision, nasal discharge, CVS- denies chest pain, palpitations RESP- denies SOB, cough, wheeze ABD- denies N/V, change in stools, abd pain GU- denies dysuria, hematuria, dribbling, incontinence MSK- + joint pain, muscle aches, injury Neuro- denies headache, dizziness, syncope, seizure activity      Objective:   Physical Exam GEN- NAD, alert and oriented x 3 ,sitting in wheelchair, HEENT-PEERL, EOMI , oropharynx clear, MMM CVS- irregular rhythem, normal rate  2/6 SEM RESP-, CTAB  ABD-NABS,soft,NT,ND Ext- no swelling Pulse- Radial 2+, DP 2+ Psych-  normal affect and mood       Assessment & Plan:

## 2013-10-01 NOTE — Assessment & Plan Note (Signed)
F/U with endocrinology Will continue prednisone until seen next week

## 2013-10-01 NOTE — Assessment & Plan Note (Signed)
Well controlled off medications.

## 2013-10-01 NOTE — Assessment & Plan Note (Signed)
Overall improved, continue PT, use of walker F/u neurology

## 2013-10-18 ENCOUNTER — Telehealth: Payer: Self-pay | Admitting: *Deleted

## 2013-10-18 MED ORDER — CLOTRIMAZOLE-BETAMETHASONE 1-0.05 % EX CREA: 1.0000 "application " | TOPICAL_CREAM | Freq: Two times a day (BID) | CUTANEOUS | Status: DC

## 2013-10-18 NOTE — Telephone Encounter (Signed)
Meds refilled.

## 2013-10-27 ENCOUNTER — Ambulatory Visit: Payer: Self-pay | Admitting: *Deleted

## 2013-10-27 DIAGNOSIS — I482 Chronic atrial fibrillation, unspecified: Secondary | ICD-10-CM

## 2013-10-27 DIAGNOSIS — Z7901 Long term (current) use of anticoagulants: Secondary | ICD-10-CM

## 2013-11-03 ENCOUNTER — Ambulatory Visit: Payer: Medicare Other | Admitting: Neurology

## 2013-11-06 ENCOUNTER — Ambulatory Visit (INDEPENDENT_AMBULATORY_CARE_PROVIDER_SITE_OTHER): Payer: Medicare Other | Admitting: Neurology

## 2013-11-06 ENCOUNTER — Encounter: Payer: Self-pay | Admitting: Neurology

## 2013-11-06 VITALS — BP 151/81 | HR 79 | Ht 64.5 in | Wt 194.0 lb

## 2013-11-06 DIAGNOSIS — R269 Unspecified abnormalities of gait and mobility: Secondary | ICD-10-CM

## 2013-11-06 DIAGNOSIS — G3184 Mild cognitive impairment, so stated: Secondary | ICD-10-CM

## 2013-11-06 NOTE — Patient Instructions (Signed)
I had a long discussion with the patient and daughter-in-law regarding her intracerebral hemorrhage, results of evaluation in the hospital, mild examination findings today and answered questions. Continue aspirin 8181 mg daily for stroke prevention given history of atrial fibrillation despite intracerebral hemorrhage. Strict control of hypertension with blood pressure goal below 130/90 and diabetes with hemoglobin A1c: 615%. I recommend outpatient physical occupational therapy to help with her gait and balance. Continue conservative followup for brain aneurysm. Hold off on medications for her vascular cognitive impairment for now. Return for followup in 3 months with Charlott Holler, nurse practitioner or call earlier if necessary

## 2013-11-06 NOTE — Progress Notes (Signed)
[facility] [address] [place]. [place] [address] [phone]       OFFICE FOLLOW-UP NOTE  Ms. Kristin Logan Date of Birth:  1932-05-16 Medical Record Number:  [id number]   HPI: 51 year lady seen for first office followup visit from hospital admission intracerebral hemorrhage in November 2014. She presented with sudden onset of confusion the night before followed by being found on the floor having fallen backwards. She had it injected on herself. She presented with left-sided weakness. CT scan of the head on admission showed her 2 x 4 x 2.3 cm right miscellaneous intracerebral hemorrhage she was on warfarin with INR of 3.2 and blood pressure was elevated. She was admitted to the intensive care unit and INR was reversed per FEIBA protocol and blood pressure was tightly controlled. Volume of hemorrhage was 25 cubic CC. Followup CT scan showed stable appearance of the hemorrhage without significant hydrocephalus. Left hemiparesis improved. She was transferred for rehabilitation and has made steady improvement. She was subsequently started on baby aspirin history of atrial fibrillation. She is presently living at home with her son and daughter-in-law. She is able to ambulate with a wheeled walker. Her balance is good and she's had no recent falls. She's had persistent memory difficulties and cognitive impairment since the hemorrhage. She needs help with some apical daily living. She complains of numbness in her feet as well as some dizziness particularly when she gets up and walks. She does have diabetes it appears to be under good control. She's finished her therapy but has not been referred for outpatient therapy yet. She was found to have a 6.5 mm asymptomatic anterior communicating artery aneurysm which had been followed conservatively over the years.  ROS:   14 system review of systems is positive for incontinence of urine, frequency or urination, memory loss, dizziness,  headache, joint pain, muscle cramps, daytime sleepiness, agitation, behavior and confusion, decreased concentration  PMH:  Past Medical History  Diagnosis Date  . Diabetes mellitus   . Hyperlipidemia   . Eosinophilia   . Cataract   . Hypertension   . Atrial fibrillation prior to 2008    moderate biatrial enlargement in 2009; mild to moderate LVH with a low-normal EF  . GERD (gastroesophageal reflux disease)     Hemorrhoids; history of peptic ulcer disease  . DJD (degenerative joint disease) of knee   . Chronic anticoagulation   . Aortic valve disease     2009: mild stenosis and regurgitation; peak gradient of 30-35 mmHg , subsequent AVR at [facility]  . Incontinence     Mild  . Hip fracture, left 12/12/2012  . Foot injury 06/25/2012  . Degenerative joint disease of knee 01/29/2008    Bilateral    . BREAST CANCER, HX OF 01/29/2008    Lumpectomy, radiation therapy  Arimidex stopped in July 2013    . Syncope and collapse 11/11/2012  . Diverticulosis   . Umbilical hernia   . Cholelithiasis   . DDD (degenerative disc disease), lumbar   . Vertebral compression fracture     lumbar  . Chronic pain   . Paresthesias     feet  . Gastroparesis   . Sleep apnea     CPAP  . Carcinoma of breast     Right mastectomy; radiation and hormonal therapy  . Stroke 2014    Hemorrhagic stroke     Social History:  History   Social History  . Marital Status: Divorced    Spouse Name:  N/A    Number of Children: 6  . Years of Education: 0   Occupational History  . Not on file.   Social History Main Topics  . Smoking status: Never Smoker   . Smokeless tobacco: Not on file  . Alcohol Use: No  . Drug Use: No  . Sexual Activity: Not Currently   Other Topics Concern  . Not on file   Social History Narrative  . No narrative on file    Medications:   Current Outpatient Prescriptions on File Prior to Visit  Medication Sig Dispense Refill  . acetaminophen (TYLENOL) 650 MG CR tablet Take 1  tablet (650 mg total) by mouth every 8 (eight) hours as needed for pain.      Marland Kitchen albuterol (PROVENTIL) (2.5 MG/3ML) 0.083% nebulizer solution Take 3 mLs (2.5 mg total) by nebulization every 6 (six) hours as needed for wheezing.  75 mL  3  . aspirin EC 81 MG tablet Take 1 tablet (81 mg total) by mouth daily.      . clotrimazole-betamethasone (LOTRISONE) cream Apply 1 application topically 2 (two) times daily.  30 g  1  . levothyroxine (SYNTHROID, LEVOTHROID) 75 MCG tablet Take 75 mcg by mouth daily before breakfast.      . omeprazole (PRILOSEC) 20 MG capsule Take 1 capsule (20 mg total) by mouth 2 (two) times daily.  60 capsule  5  . predniSONE (DELTASONE) 5 MG tablet Take 3 tablets (15 mg total) by mouth daily with breakfast.  90 tablet  1  . simvastatin (ZOCOR) 20 MG tablet Take 1 tablet (20 mg total) by mouth at bedtime.  30 tablet  6  . albuterol (PROVENTIL HFA;VENTOLIN HFA) 108 (90 BASE) MCG/ACT inhaler Inhale 2 puffs into the lungs every 4 (four) hours as needed for wheezing.  1 Inhaler  2   No current facility-administered medications on file prior to visit.    Allergies:   Allergies  Allergen Reactions  . Coumadin [Warfarin Sodium] Other (See Comments)    Patient had ICH  . Nsaids     Bleeding ulcer  . Codeine Nausea And Vomiting  . Adhesive [Tape] Rash    Redness, and peeling skin off.    Filed Vitals:   11/06/13 1555  BP: 151/81  Pulse: 79    Physical Exam General: well developed, well nourished, seated, in no evident distress Head: head normocephalic and atraumatic. Orohparynx benign Neck: supple with no carotid or supraclavicular bruits Cardiovascular: regular rate and rhythm, no murmurs Musculoskeletal: no deformity Skin:  no rash/petichiae Vascular:  Normal pulses all extremities Filed Vitals:   11/06/13 1555  BP: 151/81  Pulse: 79   Neurologic Exam Mental Status: Awake and fully alert. Oriented to place and time. Recent and remote memory diminishedt. Attention  span, concentration and fund of knowledge poor. Mood and affect appropriate. MMSE 16/25 with deficits in orientation, recall. Unable to write and read Cranial Nerves: Fundoscopic exam reveals sharp disc margins. Pupils equal, briskly reactive to light. Extraocular movements full without nystagmus. Visual fields full to confrontation. Hearing intact. Facial sensation intact. Face, tongue, palate moves normally and symmetrically.  Motor: Normal bulk and tone. Normal strength in all tested extremity muscles. Diminished fine finger from the left. Mild weakness of left. Orbits right over left upper extremity. Stiffness of the left leg and weakness of ankle dorsiflexors and hip flexors Sensory.: intact to touch and pinprick and vibratory sensation.  Coordination: Rapid alternating movements normal in all extremities. Finger-to-nose and heel-to-shin performed  accurately bilaterally. Gait and Station: Arises from chair without difficulty. Stance is normal. Gait demonstrates broad base mild im balance . Uses a walker Reflexes: 1+ and symmetric. Toes downgoing.   NIHSS  1 Modified Rankin  2   ASSESSMENT: 38 year lady with basal ganglia hemorrhage in  November 2014 secondary to hypertension and warfarin coagulopathy Asymptomatic 6.5 mm anterior communicating artery aneurysm was which has been stable   PLAN: I had a long discussion with the patient and daughter-in-law regarding her intracerebral hemorrhage, results of evaluation in the hospital, mild examination findings today and answered questions. Continue aspirin 8181 mg daily for stroke prevention given history of atrial fibrillation despite intracerebral hemorrhage. Strict control of hypertension with blood pressure goal below 130/90 and diabetes with hemoglobin A1c: 615%. I recommend outpatient physical occupational therapy to help with her gait and balance. Continue conservative followup for brain aneurysm. Hold off on medications for her vascular  cognitive impairment for now. Return for followup in 3 months with Charlott Holler, nurse practitioner or call earlier if necessary    Note: This document was prepared with digital dictation and possible smart phrase technology. Any transcriptional errors that result from this process are unintentional

## 2013-11-07 ENCOUNTER — Ambulatory Visit: Payer: Medicare Other | Admitting: Family Medicine

## 2013-12-04 ENCOUNTER — Telehealth: Payer: Self-pay | Admitting: *Deleted

## 2013-12-04 NOTE — Telephone Encounter (Signed)
Received fax from pharmacy requesting refill for Miralax. Medication not on list.   Ok to refill??

## 2013-12-05 MED ORDER — POLYETHYLENE GLYCOL 3350 17 GM/SCOOP PO POWD: 17.0000 g | Freq: Every day | ORAL | Status: DC

## 2013-12-05 NOTE — Telephone Encounter (Signed)
Okay to refill , give 6 months

## 2013-12-05 NOTE — Telephone Encounter (Signed)
Prescription sent to pharmacy.

## 2013-12-09 ENCOUNTER — Ambulatory Visit (INDEPENDENT_AMBULATORY_CARE_PROVIDER_SITE_OTHER): Payer: Medicare Other | Admitting: Family Medicine

## 2013-12-09 ENCOUNTER — Encounter: Payer: Self-pay | Admitting: Family Medicine

## 2013-12-09 VITALS — BP 130/72 | HR 80 | Temp 97.7°F | Resp 20 | Ht 63.5 in | Wt 197.0 lb

## 2013-12-09 DIAGNOSIS — R300 Dysuria: Secondary | ICD-10-CM

## 2013-12-09 DIAGNOSIS — E119 Type 2 diabetes mellitus without complications: Secondary | ICD-10-CM

## 2013-12-09 DIAGNOSIS — H6120 Impacted cerumen, unspecified ear: Secondary | ICD-10-CM

## 2013-12-09 DIAGNOSIS — L304 Erythema intertrigo: Secondary | ICD-10-CM

## 2013-12-09 DIAGNOSIS — L538 Other specified erythematous conditions: Secondary | ICD-10-CM

## 2013-12-09 DIAGNOSIS — H9209 Otalgia, unspecified ear: Secondary | ICD-10-CM

## 2013-12-09 LAB — URINALYSIS, ROUTINE W REFLEX MICROSCOPIC
BILIRUBIN URINE: NEGATIVE
Glucose, UA: NEGATIVE mg/dL
Hgb urine dipstick: NEGATIVE
KETONES UR: NEGATIVE mg/dL
NITRITE: NEGATIVE
PROTEIN: NEGATIVE mg/dL
Specific Gravity, Urine: 1.015 (ref 1.005–1.030)
UROBILINOGEN UA: 0.2 mg/dL (ref 0.0–1.0)
pH: 7.5 (ref 5.0–8.0)

## 2013-12-09 LAB — URINALYSIS, MICROSCOPIC ONLY
Casts: NONE SEEN
Crystals: NONE SEEN

## 2013-12-09 MED ORDER — NYSTATIN 100000 UNIT/GM EX POWD: CUTANEOUS | Status: DC

## 2013-12-09 NOTE — Patient Instructions (Signed)
We will call with urine sample and lab results Continue current medications Use the nystatin powder Take the tylenol Call if the ear pain is not better Change f/u to 3 months

## 2013-12-10 DIAGNOSIS — H6120 Impacted cerumen, unspecified ear: Secondary | ICD-10-CM | POA: Insufficient documentation

## 2013-12-10 DIAGNOSIS — L304 Erythema intertrigo: Secondary | ICD-10-CM | POA: Insufficient documentation

## 2013-12-10 DIAGNOSIS — H9209 Otalgia, unspecified ear: Secondary | ICD-10-CM | POA: Insufficient documentation

## 2013-12-10 LAB — BASIC METABOLIC PANEL
BUN: 14 mg/dL (ref 6–23)
CALCIUM: 8.9 mg/dL (ref 8.4–10.5)
CHLORIDE: 101 meq/L (ref 96–112)
CO2: 29 mEq/L (ref 19–32)
CREATININE: 0.67 mg/dL (ref 0.50–1.10)
Glucose, Bld: 141 mg/dL — ABNORMAL HIGH (ref 70–99)
Potassium: 4.5 mEq/L (ref 3.5–5.3)
Sodium: 139 mEq/L (ref 135–145)

## 2013-12-10 LAB — HEMOGLOBIN A1C
HEMOGLOBIN A1C: 6 % — AB (ref <5.7)
MEAN PLASMA GLUCOSE: 126 mg/dL — AB (ref <117)

## 2013-12-10 NOTE — Progress Notes (Signed)
Patient ID: Kristin Logan, female   DOB: 06-14-1932, 80 y.o.   MRN: [id number]   Subjective:    Patient ID: Kristin Logan, female    DOB: Mar 23, 1932, 80 y.o.   MRN: [id number]  Patient presents for L ear pain, L collar bone pain, rash and vaginal irritation  she's had left-sided ear pain which has radiated into her neck for the past couple days. She's not had any change in her hearing and no drainage from the ear. She's felt some pain all the way down to her collarbone. She does take a Tylenol extra strength which helps the pain into her neck and her collarbone but her ear so hurts.  Rash beneath her pannus on and off on both sides. She's also had some irritation into the vaginal region but this is now cleared and she did use her Lotrisone cream.   Chronic hyponatremia she is still on prednisone by endocrinology  Recent sure that she is being followed by neurology and is set up to have occupational therapy  She is due for labs  Diabetes mellitus she's been watching her diet, diabetes is diet controlled at this point she does have some mild diabetic neuropathy but if she takes Tylenol this does help as well as wearing her diabetic shoes.  Mild dysuria past few day, no abd pain  Review Of Systems:  GEN- denies fatigue, fever, weight loss,weakness, recent illness HEENT- denies eye drainage, change in vision, nasal discharge, CVS- denies chest pain, palpitations RESP- denies SOB, cough, wheeze ABD- denies N/V, change in stools, abd pain GU- denies dysuria, hematuria, dribbling, incontinence MSK-+ joint pain, muscle aches, injury Neuro- denies headache, dizziness, syncope, seizure activity       Objective:    BP 130/72  Pulse 80  Temp(Src) 97.7 F (36.5 C)  Resp 20  Ht 5' 3.5" (1.613 m)  Wt 197 lb (89.359 kg)  BMI 34.35 kg/m2 GEN- NAD, alert and oriented x 3 ,sitting in wheelchair, well appearing HEENT-PEERL, EOMI , oropharynx clear, MMM, TM bilat cerumen impaction, TM  clear bilat s/p irrigation Neck- mild TTP left anterior neck, fair ROM,neg spurlings, Shoulder fair ROM bilat CVS- irregular rhythem, normal rate  2/6 SEM RESP-, CTAB  ABD-NABS,soft,NT,ND Ext- no swelling Pulse- Radial 2+, DP 2+ Skin- erythema and moisture beneath pannus bilat, peeling skin        Assessment & Plan:      Problem List Items Addressed This Visit   DIABETES MELLITUS, TYPE II - Primary   Relevant Orders      Hemoglobin A1c (Completed)      Basic metabolic panel (Completed)    Other Visit Diagnoses   Dysuria        Relevant Orders       Urinalysis, Routine w reflex microscopic (Completed)       Note: This dictation was prepared with Dragon dictation along with smaller phrase technology. Any transcriptional errors that result from this process are unintentional.

## 2013-12-10 NOTE — Assessment & Plan Note (Signed)
Recheck A1C, diet controlled  

## 2013-12-10 NOTE — Assessment & Plan Note (Signed)
S/p irrigation

## 2013-12-10 NOTE — Assessment & Plan Note (Signed)
Her pain improved after cerumen removal, no overt infection, will see how she does, other differentials trigeminal neuralgia,, OA neck

## 2013-12-10 NOTE — Assessment & Plan Note (Signed)
Nystatin powder to be applied

## 2013-12-11 ENCOUNTER — Telehealth: Payer: Self-pay | Admitting: *Deleted

## 2013-12-11 MED ORDER — CEPHALEXIN 500 MG PO CAPS: 500.0000 mg | ORAL_CAPSULE | Freq: Three times a day (TID) | ORAL | Status: DC

## 2013-12-11 NOTE — Telephone Encounter (Signed)
Ear infection will not cause that.  We need to see patient (tomorrow)

## 2013-12-11 NOTE — Telephone Encounter (Signed)
Message copied by Sheral Flow on Thu Dec 11, 2013 10:39 AM ------      Message from: Vic Blackbird F      Created: Wed Dec 10, 2013  8:56 PM       Call pt daughter in law      Her urine has evidence of mild infection, if she still has some burning send in Keflex 500mg  TID x 5 days      Diabetes looks okay , A1C 6%       ------

## 2013-12-11 NOTE — Telephone Encounter (Signed)
Call placed to patient and patient daughter made aware.   Reported that burning continues.   Prescription sent to pharmacy.

## 2013-12-11 NOTE — Telephone Encounter (Signed)
Call placed to patient and patient daughter made aware.   Appointment scheduled for 12/12/2013 @ 8:15am.

## 2013-12-11 NOTE — Telephone Encounter (Signed)
Patient seen in office 12/09/2013 for L ear pain. MD noted cerumen impaction and ears were cleaned.   Patient reported that pressure was relieved at that time.   Patient had also reported neck pain on the L side that radiated to L clavicle that was relieved after irrigation of ears.   Received call from patient daughter reporting that neck pain continues, and is reported to extend past L clavicle and into L shoulder.   Heating pad and tylenol being used.   MD please advise.

## 2013-12-11 NOTE — Telephone Encounter (Signed)
Message copied by Sheral Flow on Thu Dec 11, 2013  9:54 AM ------      Message from: Lenore Manner      Created: Thu Dec 11, 2013  9:49 AM      Regarding: Not better      Contact: [phone]       I believe it was her daughter that called but she is not any better. Can you please call her. ------

## 2013-12-12 ENCOUNTER — Ambulatory Visit: Payer: Medicare Other

## 2013-12-12 ENCOUNTER — Encounter: Payer: Self-pay | Admitting: Family Medicine

## 2013-12-12 ENCOUNTER — Ambulatory Visit (INDEPENDENT_AMBULATORY_CARE_PROVIDER_SITE_OTHER): Payer: Medicare Other | Admitting: Family Medicine

## 2013-12-12 VITALS — BP 140/100 | HR 68 | Temp 95.6°F | Resp 16 | Ht 63.5 in | Wt 196.0 lb

## 2013-12-12 DIAGNOSIS — B029 Zoster without complications: Secondary | ICD-10-CM

## 2013-12-12 DIAGNOSIS — H9202 Otalgia, left ear: Secondary | ICD-10-CM

## 2013-12-12 DIAGNOSIS — M25512 Pain in left shoulder: Secondary | ICD-10-CM

## 2013-12-12 DIAGNOSIS — M542 Cervicalgia: Secondary | ICD-10-CM

## 2013-12-12 DIAGNOSIS — M25519 Pain in unspecified shoulder: Secondary | ICD-10-CM

## 2013-12-12 DIAGNOSIS — H9209 Otalgia, unspecified ear: Secondary | ICD-10-CM

## 2013-12-12 MED ORDER — OXYCODONE-ACETAMINOPHEN 5-325 MG PO TABS: 1.0000 | ORAL_TABLET | Freq: Four times a day (QID) | ORAL | Status: DC | PRN

## 2013-12-12 MED ORDER — VALACYCLOVIR HCL 1 G PO TABS: 1000.0000 mg | ORAL_TABLET | Freq: Three times a day (TID) | ORAL | Status: DC

## 2013-12-12 NOTE — Progress Notes (Signed)
Subjective:    Patient ID: Kristin Logan, female    DOB: [birth year], 80 y.o.   MRN: [id number]  HPI Patient was seen 3/10 and diagnosed with a cerumen impaction by Dr. Buelah Manis. The cerumen impaction was cleared with irrigation. However the patient is now complaining of pain in her left ear, left neck, and shoulder pain.  Yesterday she developed an erythematous blistering rash over her posterior left shoulder and down the anterior portion of her left arm. The blisters in her antecubital fossa and on her forearm. Today she is complaining of severe pain in the distribution of the rash. The pain is burning and searing in nature. The pain is unrelenting and severe. The pain radiates from her neck into her left shoulder and down her left arm. Past Medical History  Diagnosis Date  . Diabetes mellitus   . Hyperlipidemia   . Eosinophilia   . Cataract   . Hypertension   . Atrial fibrillation prior to 2008    moderate biatrial enlargement in 2009; mild to moderate LVH with a low-normal EF  . GERD (gastroesophageal reflux disease)     Hemorrhoids; history of peptic ulcer disease  . DJD (degenerative joint disease) of knee   . Chronic anticoagulation   . Aortic valve disease     2009: mild stenosis and regurgitation; peak gradient of 30-35 mmHg , subsequent AVR at [facility]  . Incontinence     Mild  . Hip fracture, left 12/12/2012  . Foot injury 06/25/2012  . Degenerative joint disease of knee 01/29/2008    Bilateral    . BREAST CANCER, HX OF 01/29/2008    Lumpectomy, radiation therapy  Arimidex stopped in July 2013    . Syncope and collapse 11/11/2012  . Diverticulosis   . Umbilical hernia   . Cholelithiasis   . DDD (degenerative disc disease), lumbar   . Vertebral compression fracture     lumbar  . Chronic pain   . Paresthesias     feet  . Gastroparesis   . Sleep apnea     CPAP  . Carcinoma of breast     Right mastectomy; radiation and hormonal therapy  . Stroke 2014    Hemorrhagic  stroke    Current Outpatient Prescriptions on File Prior to Visit  Medication Sig Dispense Refill  . acetaminophen (TYLENOL) 650 MG CR tablet Take 1 tablet (650 mg total) by mouth every 8 (eight) hours as needed for pain.      Marland Kitchen aspirin EC 81 MG tablet Take 1 tablet (81 mg total) by mouth daily.      . cephALEXin (KEFLEX) 500 MG capsule Take 1 capsule (500 mg total) by mouth 3 (three) times daily.  15 capsule  0  . clotrimazole-betamethasone (LOTRISONE) cream Apply 1 application topically 2 (two) times daily.  30 g  1  . esomeprazole (NEXIUM) 20 MG capsule Take 20 mg by mouth daily at 12 noon.      Marland Kitchen levothyroxine (SYNTHROID, LEVOTHROID) 75 MCG tablet Take 75 mcg by mouth daily before breakfast.      . nystatin (MYCOSTATIN/NYSTOP) 100000 UNIT/GM POWD Apply to affected areas three times a day  60 g  3  . polyethylene glycol powder (GLYCOLAX/MIRALAX) powder Take 17 g by mouth daily.  3350 g  6  . predniSONE (DELTASONE) 5 MG tablet Take 10 mg by mouth daily with breakfast.      . simvastatin (ZOCOR) 20 MG tablet Take 1 tablet (20 mg total) by mouth at  bedtime.  30 tablet  6   No current facility-administered medications on file prior to visit.   Allergies  Allergen Reactions  . Coumadin [Warfarin Sodium] Other (See Comments)    Patient had ICH  . Nsaids     Bleeding ulcer  . Codeine Nausea And Vomiting  . Adhesive [Tape] Rash    Redness, and peeling skin off.    History   Social History  . Marital Status: Divorced    Spouse Name: N/A    Number of Children: 6  . Years of Education: 0   Occupational History  . Not on file.   Social History Main Topics  . Smoking status: Never Smoker   . Smokeless tobacco: Not on file  . Alcohol Use: No  . Drug Use: No  . Sexual Activity: Not Currently   Other Topics Concern  . Not on file   Social History Narrative  . No narrative on file      Review of Systems  All other systems reviewed and are negative.       Objective:    Physical Exam  Vitals reviewed. HENT:  Right Ear: Tympanic membrane, external ear and ear canal normal.  Left Ear: Tympanic membrane, external ear and ear canal normal.  Nose: Nose normal.  Mouth/Throat: Oropharynx is clear and moist. No oropharyngeal exudate.  Cardiovascular: Normal rate and regular rhythm.   Pulmonary/Chest: Effort normal and breath sounds normal.  Skin: Rash noted. There is erythema.    Patient has an erythematous blistering rash in a dermatomal distribution of the posterior aspect of her left neck left shoulder and down into her left arm  There is no evidence of Ramsay Hunt syndrome. Examination of the left external auditory canal is normal. The left tympanic membrane looks normal.    Assessment & Plan:  Left shoulder pain  Otalgia of left ear  Neck pain on left side  Shingles - Plan: valACYclovir (VALTREX) 1000 MG tablet, oxyCODONE-acetaminophen (ROXICET) 5-325 MG per tablet  Patient has developed shingles. I believe this is the explanation for all of her pain. I believe her pain is neuropathic in nature. I will start the patient on Valtrex 1 g by mouth 3 times a day for 7 days. I will also give her Percocet 5/325 one to 2 tablets every 6 hours as needed for pain. I gave her 30 tablets with 0 refills. If pain persists greater than one to 2 weeks consider adding Lyrica.

## 2013-12-16 ENCOUNTER — Telehealth: Payer: Self-pay | Admitting: Neurology

## 2013-12-16 ENCOUNTER — Telehealth: Payer: Self-pay | Admitting: *Deleted

## 2013-12-16 NOTE — Telephone Encounter (Signed)
Patient's daughter-in-law was advised to call the PCP, the doctor that prescribed the Oxycodone and inform them of what has been going on and get their advice on what she needs to do.  She did verbalize understanding.

## 2013-12-16 NOTE — Telephone Encounter (Signed)
Stop the oxycodone, if she is still confused take her to ER to be evaluated The abdominal pain may also be coming from the oxycodone  Send to ER

## 2013-12-16 NOTE — Telephone Encounter (Signed)
Call placed to [name].   Reported that patient confusion has lessened, and abdominal pain has lessened.   Advised that if Sx persist over night that patient needs to be seen in ER.

## 2013-12-16 NOTE — Telephone Encounter (Signed)
Pt's daughter-in-law, Janace Hoard, called.  She stated that Mrs. Mcmichael was diagnosed with shingles on Friday, 12-12-13. Her doctor put her on oxycodone for her pain. Since starting the medicine she has become very confused.  Last night she didn't know who her daughter-in-law was, she also thought she was in a nursing home however she is still living at home, and she is forgetting that they have bathrooms.  She is still very confused this morning.  [name] wasn't sure whether its because of the shingles, if this is something that the medicine is causing or if it is something else.  Please call to advise if she needs to come in for a visit.  Thank you

## 2013-12-16 NOTE — Telephone Encounter (Signed)
Returned call to Janace Hoard, patient daughter in Sports [employer].   Reported that patient has increased confusion and hallucinations.   States that she has stopped giving oxycodone.   Reported that patient has increased stomach pain.  Reported that with shingles she reported pain on the side of body, arm and head, but she is not complaining of that pain today.   MD please advise.

## 2013-12-16 NOTE — Telephone Encounter (Signed)
Message copied by Sheral Flow on Tue Dec 16, 2013 10:44 AM ------      Message from: Lenore Manner      Created: Tue Dec 16, 2013  9:59 AM      Regarding: Med Problem      Contact: [phone]       Someone is calling for Kristin Logan states that she was given oxycodone  for her shingles on Friday and she has been having some bad hallucinations with it. They called the neurologist this morning and they told her to call her PCP first.  ------

## 2013-12-22 ENCOUNTER — Encounter (HOSPITAL_COMMUNITY): Payer: Self-pay | Admitting: Emergency Medicine

## 2013-12-22 ENCOUNTER — Emergency Department (HOSPITAL_COMMUNITY): Payer: Medicare Other

## 2013-12-22 ENCOUNTER — Emergency Department (HOSPITAL_COMMUNITY)
Admission: EM | Admit: 2013-12-22 | Discharge: 2013-12-22 | Disposition: A | Discharge status: Home / Self Care | Payer: Medicare Other | Attending: Emergency Medicine | Admitting: Emergency Medicine

## 2013-12-22 DIAGNOSIS — G8929 Other chronic pain: Secondary | ICD-10-CM | POA: Insufficient documentation

## 2013-12-22 DIAGNOSIS — IMO0002 Reserved for concepts with insufficient information to code with codable children: Secondary | ICD-10-CM | POA: Insufficient documentation

## 2013-12-22 DIAGNOSIS — R079 Chest pain, unspecified: Secondary | ICD-10-CM

## 2013-12-22 DIAGNOSIS — Z792 Long term (current) use of antibiotics: Secondary | ICD-10-CM | POA: Insufficient documentation

## 2013-12-22 DIAGNOSIS — G4730 Sleep apnea, unspecified: Secondary | ICD-10-CM | POA: Insufficient documentation

## 2013-12-22 DIAGNOSIS — Z853 Personal history of malignant neoplasm of breast: Secondary | ICD-10-CM | POA: Insufficient documentation

## 2013-12-22 DIAGNOSIS — F29 Unspecified psychosis not due to a substance or known physiological condition: Secondary | ICD-10-CM | POA: Insufficient documentation

## 2013-12-22 DIAGNOSIS — Z8781 Personal history of (healed) traumatic fracture: Secondary | ICD-10-CM | POA: Insufficient documentation

## 2013-12-22 DIAGNOSIS — Z8739 Personal history of other diseases of the musculoskeletal system and connective tissue: Secondary | ICD-10-CM | POA: Insufficient documentation

## 2013-12-22 DIAGNOSIS — E119 Type 2 diabetes mellitus without complications: Secondary | ICD-10-CM | POA: Insufficient documentation

## 2013-12-22 DIAGNOSIS — N390 Urinary tract infection, site not specified: Secondary | ICD-10-CM

## 2013-12-22 DIAGNOSIS — Z87828 Personal history of other (healed) physical injury and trauma: Secondary | ICD-10-CM | POA: Insufficient documentation

## 2013-12-22 DIAGNOSIS — K219 Gastro-esophageal reflux disease without esophagitis: Secondary | ICD-10-CM | POA: Insufficient documentation

## 2013-12-22 DIAGNOSIS — Z7982 Long term (current) use of aspirin: Secondary | ICD-10-CM | POA: Insufficient documentation

## 2013-12-22 DIAGNOSIS — Z8673 Personal history of transient ischemic attack (TIA), and cerebral infarction without residual deficits: Secondary | ICD-10-CM | POA: Insufficient documentation

## 2013-12-22 DIAGNOSIS — Z862 Personal history of diseases of the blood and blood-forming organs and certain disorders involving the immune mechanism: Secondary | ICD-10-CM | POA: Insufficient documentation

## 2013-12-22 DIAGNOSIS — Z8669 Personal history of other diseases of the nervous system and sense organs: Secondary | ICD-10-CM | POA: Insufficient documentation

## 2013-12-22 DIAGNOSIS — Z9981 Dependence on supplemental oxygen: Secondary | ICD-10-CM | POA: Insufficient documentation

## 2013-12-22 DIAGNOSIS — R410 Disorientation, unspecified: Secondary | ICD-10-CM

## 2013-12-22 DIAGNOSIS — I10 Essential (primary) hypertension: Secondary | ICD-10-CM | POA: Insufficient documentation

## 2013-12-22 DIAGNOSIS — E785 Hyperlipidemia, unspecified: Secondary | ICD-10-CM | POA: Insufficient documentation

## 2013-12-22 DIAGNOSIS — Z79899 Other long term (current) drug therapy: Secondary | ICD-10-CM | POA: Insufficient documentation

## 2013-12-22 LAB — CBC WITH DIFFERENTIAL/PLATELET
Basophils Absolute: 0 10*3/uL (ref 0.0–0.1)
Basophils Relative: 0 % (ref 0–1)
Eosinophils Absolute: 0.7 10*3/uL (ref 0.0–0.7)
Eosinophils Relative: 6 % — ABNORMAL HIGH (ref 0–5)
HEMATOCRIT: 40 % (ref 36.0–46.0)
HEMOGLOBIN: 13.7 g/dL (ref 12.0–15.0)
Lymphocytes Relative: 24 % (ref 12–46)
Lymphs Abs: 2.5 10*3/uL (ref 0.7–4.0)
MCH: 30.7 pg (ref 26.0–34.0)
MCHC: 34.3 g/dL (ref 30.0–36.0)
MCV: 89.7 fL (ref 78.0–100.0)
MONO ABS: 0.5 10*3/uL (ref 0.1–1.0)
MONOS PCT: 5 % (ref 3–12)
NEUTROS ABS: 6.9 10*3/uL (ref 1.7–7.7)
Neutrophils Relative %: 65 % (ref 43–77)
Platelets: 235 10*3/uL (ref 150–400)
RBC: 4.46 MIL/uL (ref 3.87–5.11)
RDW: 14.5 % (ref 11.5–15.5)
WBC: 10.6 10*3/uL — AB (ref 4.0–10.5)

## 2013-12-22 LAB — URINALYSIS, ROUTINE W REFLEX MICROSCOPIC
BILIRUBIN URINE: NEGATIVE
Glucose, UA: NEGATIVE mg/dL
HGB URINE DIPSTICK: NEGATIVE
Ketones, ur: NEGATIVE mg/dL
Nitrite: POSITIVE — AB
PH: 6 (ref 5.0–8.0)
Protein, ur: NEGATIVE mg/dL
Specific Gravity, Urine: 1.01 (ref 1.005–1.030)
Urobilinogen, UA: 0.2 mg/dL (ref 0.0–1.0)

## 2013-12-22 LAB — COMPREHENSIVE METABOLIC PANEL
ALT: 16 U/L (ref 0–35)
AST: 22 U/L (ref 0–37)
Albumin: 3.5 g/dL (ref 3.5–5.2)
Alkaline Phosphatase: 45 U/L (ref 39–117)
BUN: 15 mg/dL (ref 6–23)
CO2: 27 mEq/L (ref 19–32)
CREATININE: 0.56 mg/dL (ref 0.50–1.10)
Calcium: 9.2 mg/dL (ref 8.4–10.5)
Chloride: 100 mEq/L (ref 96–112)
GFR calc Af Amer: >90 mL/min (ref >90)
GFR calc non Af Amer: 86 mL/min — ABNORMAL LOW (ref >90)
Glucose, Bld: 125 mg/dL — ABNORMAL HIGH (ref 70–99)
Potassium: 4.1 mEq/L (ref 3.7–5.3)
Sodium: 138 mEq/L (ref 137–147)
TOTAL PROTEIN: 7 g/dL (ref 6.0–8.3)
Total Bilirubin: 0.4 mg/dL (ref 0.3–1.2)

## 2013-12-22 LAB — URINE MICROSCOPIC-ADD ON

## 2013-12-22 LAB — PRO B NATRIURETIC PEPTIDE: Pro B Natriuretic peptide (BNP): 820 pg/mL — ABNORMAL HIGH (ref 0–450)

## 2013-12-22 LAB — PROTIME-INR
INR: 0.95 (ref 0.00–1.49)
PROTHROMBIN TIME: 12.5 s (ref 11.6–15.2)

## 2013-12-22 LAB — TROPONIN I

## 2013-12-22 LAB — LIPASE, BLOOD: LIPASE: 32 U/L (ref 11–59)

## 2013-12-22 MED ORDER — ONDANSETRON HCL 4 MG/2ML IJ SOLN
4.0000 mg | Freq: Once | INTRAMUSCULAR | Status: AC
  Administered 2013-12-22: 4 mg via INTRAVENOUS
  Filled 2013-12-22: qty 2

## 2013-12-22 MED ORDER — DEXTROSE 5 % IV SOLN: 1.0000 g | Freq: Once | INTRAVENOUS | Status: DC

## 2013-12-22 MED ORDER — CIPROFLOXACIN IN D5W 400 MG/200ML IV SOLN
400.0000 mg | Freq: Once | INTRAVENOUS | Status: AC
  Administered 2013-12-22: 400 mg via INTRAVENOUS
  Filled 2013-12-22: qty 200

## 2013-12-22 MED ORDER — CIPROFLOXACIN HCL 500 MG PO TABS: 500.0000 mg | ORAL_TABLET | Freq: Two times a day (BID) | ORAL | Status: DC

## 2013-12-22 MED ORDER — ASPIRIN 81 MG PO CHEW
162.0000 mg | CHEWABLE_TABLET | Freq: Once | ORAL | Status: AC
  Administered 2013-12-22: 162 mg via ORAL
  Filled 2013-12-22: qty 2

## 2013-12-22 MED ORDER — SODIUM CHLORIDE 0.9 % IV SOLN
INTRAVENOUS | Status: DC
  Administered 2013-12-22: 13:00:00 via INTRAVENOUS

## 2013-12-22 MED ORDER — SODIUM CHLORIDE 0.9 % IV BOLUS (SEPSIS)
250.0000 mL | Freq: Once | INTRAVENOUS | Status: AC
  Administered 2013-12-22: 250 mL via INTRAVENOUS

## 2013-12-22 MED ORDER — MORPHINE SULFATE 2 MG/ML IJ SOLN
1.0000 mg | Freq: Once | INTRAMUSCULAR | Status: AC
  Administered 2013-12-22: 1 mg via INTRAVENOUS

## 2013-12-22 NOTE — ED Provider Notes (Signed)
CSN: [id number]     Arrival date & time 12/22/13  0913 History  This chart was scribed for Mervin Kung, MD by Ludger Nutting, ED Scribe. This patient was seen in room APA14/[facility]14 and the patient's care was started 9:42 AM.    Chief Complaint  Patient presents with  . Chest Pain      The history is provided by the patient and a relative. No language interpreter was used.    HPI Comments: Kristin Logan is a 80 y.o. female who presents to the Emergency Department complaining of about 12 hours of left sided chest pain that radiates to the left neck and jaw. She describes the pain as soreness and rates it as 8-9/10. She has had similar symptoms in the past but states this is worse. She had a recent episode of shingles to the left neck, left arm, and back about 1 week ago. She was prescribed oxycodone for the pain from shingles but family member states patient has had associated confusion while taking it. Family has stopped the medication and patient's confusion is gradually improving. She had a CVA in November 2014 with residual left sided weakness. Patient takes a daily baby ASA. She denies SOB, nausea.    PCP Dr. Buelah Manis Cardiologist Velora Heckler    Past Medical History  Diagnosis Date  . Diabetes mellitus   . Hyperlipidemia   . Eosinophilia   . Cataract   . Hypertension   . Atrial fibrillation prior to 2008    moderate biatrial enlargement in 2009; mild to moderate LVH with a low-normal EF  . GERD (gastroesophageal reflux disease)     Hemorrhoids; history of peptic ulcer disease  . DJD (degenerative joint disease) of knee   . Chronic anticoagulation   . Aortic valve disease     2009: mild stenosis and regurgitation; peak gradient of 30-35 mmHg , subsequent AVR at [facility]  . Incontinence     Mild  . Hip fracture, left 12/12/2012  . Foot injury 06/25/2012  . Degenerative joint disease of knee 01/29/2008    Bilateral    . BREAST CANCER, HX OF 01/29/2008    Lumpectomy, radiation therapy   Arimidex stopped in July 2013    . Syncope and collapse 11/11/2012  . Diverticulosis   . Umbilical hernia   . Cholelithiasis   . DDD (degenerative disc disease), lumbar   . Vertebral compression fracture     lumbar  . Chronic pain   . Paresthesias     feet  . Gastroparesis   . Sleep apnea     CPAP  . Carcinoma of breast     Right mastectomy; radiation and hormonal therapy  . Stroke 2014    Hemorrhagic stroke    Past Surgical History  Procedure Laterality Date  . Exploration post operative open heart    . Colonoscopy      Approximately 2000  . Cardiac valve replacement      [facility]  . Cardiac valve replacement    . Breast surgery    . Breast lumpectomy    . Esophagogastroduodenoscopy N/A 02/20/2013    Procedure: ESOPHAGOGASTRODUODENOSCOPY (EGD);  Surgeon: Rogene Houston, MD;  Location: [facility];  Service: Endoscopy;  Laterality: N/A;  . Mastectomy     Family History  Problem Relation Age of Onset  . Heart disease Mother   . Stroke Father   . Cancer Sister   . Heart disease Sister   . Stroke Sister   . Stroke  Brother    History  Substance Use Topics  . Smoking status: Never Smoker   . Smokeless tobacco: Not on file  . Alcohol Use: No   OB History   Grav Para Term Preterm Abortions TAB SAB Ect Mult Living                 Review of Systems  Unable to perform ROS: Other      Allergies  Coumadin; Nsaids; Codeine; and Adhesive  Home Medications   Current Outpatient Rx  Name  Route  Sig  Dispense  Refill  . acetaminophen (TYLENOL) 650 MG CR tablet   Oral   Take 1 tablet (650 mg total) by mouth every 8 (eight) hours as needed for pain.         Marland Kitchen aspirin EC 81 MG tablet   Oral   Take 1 tablet (81 mg total) by mouth daily.           Patient has had GI irritation in past with NSAIDS. ...   . cephALEXin (KEFLEX) 500 MG capsule   Oral   Take 1 capsule (500 mg total) by mouth 3 (three) times daily.   15 capsule   0   .  clotrimazole-betamethasone (LOTRISONE) cream   Topical   Apply 1 application topically 2 (two) times daily.   30 g   1   . esomeprazole (NEXIUM) 20 MG capsule   Oral   Take 20 mg by mouth daily at 12 noon.         Marland Kitchen levothyroxine (SYNTHROID, LEVOTHROID) 75 MCG tablet   Oral   Take 75 mcg by mouth daily before breakfast.         . nystatin (MYCOSTATIN/NYSTOP) 100000 UNIT/GM POWD      Apply to affected areas three times a day   60 g   3   . polyethylene glycol powder (GLYCOLAX/MIRALAX) powder   Oral   Take 17 g by mouth daily.   3350 g   6   . predniSONE (DELTASONE) 5 MG tablet   Oral   Take 10 mg by mouth daily with breakfast.         . simvastatin (ZOCOR) 20 MG tablet   Oral   Take 1 tablet (20 mg total) by mouth at bedtime.   30 tablet   6   . valACYclovir (VALTREX) 1000 MG tablet   Oral   Take 1 tablet (1,000 mg total) by mouth 3 (three) times daily.   21 tablet   0    BP 170/79  Pulse 71  Temp(Src) 97.6 F (36.4 C)  Resp 18  Ht 5\' 5"  (1.651 m)  Wt 197 lb (89.359 kg)  BMI 32.78 kg/m2  SpO2 97% Physical Exam  Nursing note and vitals reviewed. Constitutional: She is oriented to person, place, and time. She appears well-developed and well-nourished.  HENT:  Head: Normocephalic and atraumatic.  Eyes: EOM are normal.  Neck: Normal range of motion.  Cardiovascular: Normal rate, regular rhythm and normal heart sounds.   Pulses:      Radial pulses are 2+ on the left side.  Pulmonary/Chest: Effort normal and breath sounds normal. No respiratory distress. She has no wheezes. She has no rales. She exhibits no tenderness.  Abdominal: Soft. Bowel sounds are normal. She exhibits no distension. There is no tenderness.  Musculoskeletal: Normal range of motion. She exhibits no edema.  Neurological: She is alert and oriented to person, place, and time.  Skin: Skin is  warm and dry.  Several 2-3 cm blistered patches of skin with dried exudate along the T1  dermatome  Psychiatric: She has a normal mood and affect.    ED Course  Procedures (including critical care time)  DIAGNOSTIC STUDIES: Oxygen Saturation is 97% on RA, adequate by my interpretation.    COORDINATION OF CARE: 9:55 AM Discussed treatment plan with pt at bedside and pt agreed to plan.   Labs Review Labs Reviewed - No data to display Imaging Review No results found.   EKG Interpretation   Date/Time:  Monday December 22 2013 09:23:13 EDT Ventricular Rate:  79 PR Interval:    QRS Duration: 92 QT Interval:  384 QTC Calculation: 440 R Axis:   11 Text Interpretation:  Undetermined rhythm Septal infarct (cited on or  before 06-May-2013) ST \\T \ T wave abnormality, consider inferior ischemia  Abnormal ECG When compared with ECG of 26-Aug-2013 03:22, Current  undetermined rhythm precludes rhythm comparison, needs review Questionable  change in initial forces of Anterior leads Premature ventricular complexes  Confirmed by Arizbeth Cawthorn  MD, Daion Ginsberg ([id number]) on 12/22/2013 9:28:54 AM      MDM   Final diagnoses:  None    Patient with onset of chest pain prior to midnight. Patient's had to negative troponins here 2 hours apart. Also EKG without acute changes. Chest x-ray is also negative. Head CT without acute changes.  EKG without acute changes. Chest x-ray as stated above negative for pneumonia pneumothorax or pulmonary edema. Head CT was done for persistent confusion no acute changes. Area stroke healing well. Some of the confusion could be tied into the urinary tract infection. Patient was treated with Keflex about a week ago for UTI stopped it over the weekend however today's urine still nitrite positive. Will switch to Cipro patient given one dose of IV Cipro antibiotic here. Patient's shingles outbreak is healing no evidence of secondary infection. Family is okay with her being discharged home.  I personally performed the services described in this documentation, which was  scribed in my presence. The recorded information has been reviewed and is accurate.    Mervin Kung, MD 12/22/13 [phone]

## 2013-12-22 NOTE — ED Notes (Signed)
Pt c/o cp since last night. Denies cough. nad noted.

## 2013-12-22 NOTE — Discharge Instructions (Signed)
As we discussed to evidence of a persistent urinary tract infection. Start new antibiotic Cipro. Have her urine checked again in 2-3 days to make sure that it's improving. Return for any new or worse symptoms. Chest pain workup here was negative head CT shows no acute changes and a prior stroke areas healing well.

## 2013-12-30 ENCOUNTER — Encounter: Payer: Self-pay | Admitting: Family Medicine

## 2013-12-30 ENCOUNTER — Ambulatory Visit: Payer: Medicare Other | Admitting: Family Medicine

## 2013-12-30 ENCOUNTER — Telehealth: Payer: Self-pay | Admitting: *Deleted

## 2013-12-30 ENCOUNTER — Ambulatory Visit (INDEPENDENT_AMBULATORY_CARE_PROVIDER_SITE_OTHER): Payer: Medicare Other | Admitting: Family Medicine

## 2013-12-30 VITALS — BP 142/80 | HR 88 | Temp 97.6°F | Resp 20 | Ht 64.5 in | Wt 195.0 lb

## 2013-12-30 DIAGNOSIS — M170 Bilateral primary osteoarthritis of knee: Secondary | ICD-10-CM

## 2013-12-30 DIAGNOSIS — R197 Diarrhea, unspecified: Secondary | ICD-10-CM

## 2013-12-30 DIAGNOSIS — E871 Hypo-osmolality and hyponatremia: Secondary | ICD-10-CM

## 2013-12-30 DIAGNOSIS — N390 Urinary tract infection, site not specified: Secondary | ICD-10-CM

## 2013-12-30 DIAGNOSIS — B029 Zoster without complications: Secondary | ICD-10-CM

## 2013-12-30 DIAGNOSIS — M1710 Unilateral primary osteoarthritis, unspecified knee: Secondary | ICD-10-CM

## 2013-12-30 DIAGNOSIS — IMO0002 Reserved for concepts with insufficient information to code with codable children: Secondary | ICD-10-CM

## 2013-12-30 LAB — URINALYSIS, ROUTINE W REFLEX MICROSCOPIC
Bilirubin Urine: NEGATIVE
GLUCOSE, UA: NEGATIVE mg/dL
Hgb urine dipstick: NEGATIVE
Ketones, ur: NEGATIVE mg/dL
LEUKOCYTES UA: NEGATIVE
Nitrite: NEGATIVE
PH: 7 (ref 5.0–8.0)
Protein, ur: 30 mg/dL — AB
Specific Gravity, Urine: 1.015 (ref 1.005–1.030)
Urobilinogen, UA: 0.2 mg/dL (ref 0.0–1.0)

## 2013-12-30 LAB — CBC WITH DIFFERENTIAL/PLATELET
BASOS ABS: 0.1 10*3/uL (ref 0.0–0.1)
BASOS PCT: 1 % (ref 0–1)
EOS ABS: 0.6 10*3/uL (ref 0.0–0.7)
Eosinophils Relative: 5 % (ref 0–5)
HCT: 43 % (ref 36.0–46.0)
HEMOGLOBIN: 15.2 g/dL — AB (ref 12.0–15.0)
Lymphocytes Relative: 34 % (ref 12–46)
Lymphs Abs: 4 10*3/uL (ref 0.7–4.0)
MCH: 30.6 pg (ref 26.0–34.0)
MCHC: 35.3 g/dL (ref 30.0–36.0)
MCV: 86.5 fL (ref 78.0–100.0)
MONOS PCT: 5 % (ref 3–12)
Monocytes Absolute: 0.6 10*3/uL (ref 0.1–1.0)
NEUTROS ABS: 6.4 10*3/uL (ref 1.7–7.7)
NEUTROS PCT: 55 % (ref 43–77)
PLATELETS: 320 10*3/uL (ref 150–400)
RBC: 4.97 MIL/uL (ref 3.87–5.11)
RDW: 15.4 % (ref 11.5–15.5)
WBC: 11.7 10*3/uL — ABNORMAL HIGH (ref 4.0–10.5)

## 2013-12-30 LAB — COMPREHENSIVE METABOLIC PANEL
ALBUMIN: 4.2 g/dL (ref 3.5–5.2)
ALK PHOS: 39 U/L (ref 39–117)
ALT: 22 U/L (ref 0–35)
AST: 21 U/L (ref 0–37)
BILIRUBIN TOTAL: 0.5 mg/dL (ref 0.2–1.2)
BUN: 10 mg/dL (ref 6–23)
CO2: 26 mEq/L (ref 19–32)
Calcium: 9.1 mg/dL (ref 8.4–10.5)
Chloride: 96 mEq/L (ref 96–112)
Creat: 0.72 mg/dL (ref 0.50–1.10)
Glucose, Bld: 89 mg/dL (ref 70–99)
Potassium: 4.1 mEq/L (ref 3.5–5.3)
SODIUM: 134 meq/L — AB (ref 135–145)
Total Protein: 6.9 g/dL (ref 6.0–8.3)

## 2013-12-30 LAB — URINALYSIS, MICROSCOPIC ONLY
CASTS: NONE SEEN
CRYSTALS: NONE SEEN

## 2013-12-30 LAB — LIPASE: Lipase: 21 U/L (ref 0–75)

## 2013-12-30 MED ORDER — LIDOCAINE 5 % EX PTCH: 1.0000 | MEDICATED_PATCH | CUTANEOUS | Status: DC

## 2013-12-30 MED ORDER — GABAPENTIN 300 MG PO CAPS
300.0000 mg | ORAL_CAPSULE | Freq: Two times a day (BID) | ORAL | Status: DC
Start: 2013-12-30 — End: 2014-03-11

## 2013-12-30 NOTE — Telephone Encounter (Signed)
Received e-mail requesting PA for Lidoderm.   PA submitted.

## 2013-12-30 NOTE — Patient Instructions (Signed)
Start gabapentin 1 tablet at bedtime for 3 days, then increase to 1 tablet in morning and at bedtime for nerve pain Use the lidoderm patch on your arm or near the rash, do not put directly on it Bring back stool sample We will call with lab results Call if anything changes

## 2013-12-31 LAB — URINE CULTURE
Colony Count: NO GROWTH
Organism ID, Bacteria: NO GROWTH

## 2013-12-31 NOTE — Assessment & Plan Note (Signed)
Diarrhea and the mist of recent antibiotics. I will do a C. difficile on her we'll hold any types of laxatives. She's not had any diarrhea today

## 2013-12-31 NOTE — Assessment & Plan Note (Signed)
I'll recheck her sodium level today

## 2013-12-31 NOTE — Assessment & Plan Note (Signed)
Her rash is starting to dry up but she now has some neuropathic pain. I will start her on gabapentin at bedtime. I've also given her some Lidoderm patches to use for pain

## 2013-12-31 NOTE — Telephone Encounter (Signed)
Received fax confirming PA approved. [id number].  Pharmacy made aware.

## 2013-12-31 NOTE — Assessment & Plan Note (Signed)
Do to her reaction with the narcotic medication we'll use Lidoderm patches for her back and knees as well as the pain from her shingles

## 2013-12-31 NOTE — Assessment & Plan Note (Signed)
Recent urinary tract infection she's completed antibiotics I will recheck the culture

## 2013-12-31 NOTE — Progress Notes (Signed)
Patient ID: Kristin Logan, female   DOB: 10/10/1931, 80 y.o.   MRN: [id number]   Subjective:    Patient ID: Kristin Logan, female    DOB: 11/08/1931, 80 y.o.   MRN: [id number]  Patient presents for Hospital F/U and chest pain  patient here to followup ER visit. She was seen in the ER after she had worsening pain from her shingles as well as pain shooting down her arm she was also having abdominal pain thought to be secondary to the oxycodone that she was taking. She was treated for urinary tract infection and has completed antibiotics for this. She's here with her daughter as well as her nurse aide.  She continues to have severe neuropathic pain down her right arm states it burns all over and it tingles even though the rash is not there They've tried over-the-counter medication such as calamine and other topicals and it has not helped.  She's also had a couple episodes of loose stools yesterday she had a large diarrhea stool she's not had any today. She denies any abdominal pain today.  She continues to have joint pain which is been worse since the shingles but they do not have any medication that she can take.    Review Of Systems:  GEN- denies fatigue, fever, weight loss,weakness, recent illness HEENT- denies eye drainage, change in vision, nasal discharge, CVS- denies chest pain, palpitations RESP- denies SOB, cough, wheeze ABD- denies N/V,+ change in stools, abd pain GU- denies dysuria, hematuria, dribbling, incontinence MSK- + joint pain, muscle aches, injury Neuro- denies headache, dizziness, syncope, seizure activity       Objective:    BP 142/80  Pulse 88  Temp(Src) 97.6 F (36.4 C) (Oral)  Resp 20  Ht 5' 4.5" (1.638 m)  Wt 195 lb (88.451 kg)  BMI 32.97 kg/m2 GEN- NAD, alert and oriented x 3 ,sitting in wheelchair, well appearing HEENT-PEERL, EOMI , oropharynx clear, MMM,  TM clear bilat  CVS- irregular rhythem, normal rate  2/6 SEM RESP-, CTAB   ABD-NABS,soft,NT,ND Skin- dry scabs on lower neck and shoulder, discolored by calamine lotion Ext- no swelling Pulse- Radial 2+,       Assessment & Plan:      Problem List Items Addressed This Visit   UTI (urinary tract infection)     Recent urinary tract infection she's completed antibiotics I will recheck the culture    Relevant Orders      Urinalysis, Routine w reflex microscopic (Completed)      Urine culture (Completed)   Shingles     Her rash is starting to dry up but she now has some neuropathic pain. I will start her on gabapentin at bedtime. I've also given her some Lidoderm patches to use for pain    Relevant Orders      CBC with Differential (Completed)      Comprehensive metabolic panel (Completed)   Osteoarthritis of both knees     Do to her reaction with the narcotic medication we'll use Lidoderm patches for her back and knees as well as the pain from her shingles    Hyponatremia     I'll recheck her sodium level today    Diarrhea - Primary     Diarrhea and the mist of recent antibiotics. I will do a C. difficile on her we'll hold any types of laxatives. She's not had any diarrhea today    Relevant Orders      CBC with Differential (Completed)  Comprehensive metabolic panel (Completed)      Lipase (Completed)      Stool culture      Clostridium Difficile by PCR      Note: This dictation was prepared with Dragon dictation along with smaller phrase technology. Any transcriptional errors that result from this process are unintentional.

## 2014-01-16 ENCOUNTER — Ambulatory Visit: Payer: Medicare Other | Attending: Neurology

## 2014-01-16 DIAGNOSIS — R269 Unspecified abnormalities of gait and mobility: Secondary | ICD-10-CM | POA: Diagnosis not present

## 2014-01-16 DIAGNOSIS — R262 Difficulty in walking, not elsewhere classified: Secondary | ICD-10-CM | POA: Diagnosis not present

## 2014-01-16 DIAGNOSIS — IMO0001 Reserved for inherently not codable concepts without codable children: Secondary | ICD-10-CM | POA: Diagnosis not present

## 2014-01-16 DIAGNOSIS — I69998 Other sequelae following unspecified cerebrovascular disease: Secondary | ICD-10-CM | POA: Diagnosis not present

## 2014-01-19 ENCOUNTER — Ambulatory Visit: Payer: Medicare Other | Admitting: Physical Therapy

## 2014-01-19 DIAGNOSIS — IMO0001 Reserved for inherently not codable concepts without codable children: Secondary | ICD-10-CM | POA: Diagnosis not present

## 2014-01-23 ENCOUNTER — Ambulatory Visit: Payer: Medicare Other | Admitting: Physical Therapy

## 2014-01-23 DIAGNOSIS — IMO0001 Reserved for inherently not codable concepts without codable children: Secondary | ICD-10-CM | POA: Diagnosis not present

## 2014-01-26 ENCOUNTER — Ambulatory Visit: Payer: Medicare Other

## 2014-01-26 DIAGNOSIS — IMO0001 Reserved for inherently not codable concepts without codable children: Secondary | ICD-10-CM | POA: Diagnosis not present

## 2014-01-28 ENCOUNTER — Ambulatory Visit: Payer: Medicare Other

## 2014-01-28 DIAGNOSIS — IMO0001 Reserved for inherently not codable concepts without codable children: Secondary | ICD-10-CM | POA: Diagnosis not present

## 2014-02-02 ENCOUNTER — Ambulatory Visit: Payer: Medicare Other | Attending: Neurology | Admitting: Physical Therapy

## 2014-02-02 DIAGNOSIS — R262 Difficulty in walking, not elsewhere classified: Secondary | ICD-10-CM | POA: Insufficient documentation

## 2014-02-02 DIAGNOSIS — IMO0001 Reserved for inherently not codable concepts without codable children: Secondary | ICD-10-CM | POA: Diagnosis not present

## 2014-02-02 DIAGNOSIS — I69998 Other sequelae following unspecified cerebrovascular disease: Secondary | ICD-10-CM | POA: Diagnosis not present

## 2014-02-02 DIAGNOSIS — R269 Unspecified abnormalities of gait and mobility: Secondary | ICD-10-CM | POA: Diagnosis not present

## 2014-02-06 ENCOUNTER — Ambulatory Visit: Payer: Medicare Other | Admitting: Physical Therapy

## 2014-02-06 DIAGNOSIS — IMO0001 Reserved for inherently not codable concepts without codable children: Secondary | ICD-10-CM | POA: Diagnosis not present

## 2014-02-09 ENCOUNTER — Ambulatory Visit: Payer: Medicare Other

## 2014-02-09 DIAGNOSIS — IMO0001 Reserved for inherently not codable concepts without codable children: Secondary | ICD-10-CM | POA: Diagnosis not present

## 2014-02-13 ENCOUNTER — Ambulatory Visit: Payer: Medicare Other | Admitting: Physical Therapy

## 2014-02-13 DIAGNOSIS — IMO0001 Reserved for inherently not codable concepts without codable children: Secondary | ICD-10-CM | POA: Diagnosis not present

## 2014-02-16 ENCOUNTER — Ambulatory Visit: Payer: Medicare Other

## 2014-02-16 DIAGNOSIS — IMO0001 Reserved for inherently not codable concepts without codable children: Secondary | ICD-10-CM | POA: Diagnosis not present

## 2014-02-18 ENCOUNTER — Encounter: Payer: Self-pay | Admitting: Family Medicine

## 2014-02-18 ENCOUNTER — Ambulatory Visit (INDEPENDENT_AMBULATORY_CARE_PROVIDER_SITE_OTHER): Payer: Medicare Other | Admitting: Family Medicine

## 2014-02-18 VITALS — BP 148/82 | HR 64 | Temp 98.3°F | Resp 20 | Wt 204.0 lb

## 2014-02-18 DIAGNOSIS — H00019 Hordeolum externum unspecified eye, unspecified eyelid: Secondary | ICD-10-CM

## 2014-02-18 DIAGNOSIS — R32 Unspecified urinary incontinence: Secondary | ICD-10-CM

## 2014-02-18 DIAGNOSIS — R300 Dysuria: Secondary | ICD-10-CM

## 2014-02-18 LAB — URINALYSIS, ROUTINE W REFLEX MICROSCOPIC
BILIRUBIN URINE: NEGATIVE
Glucose, UA: NEGATIVE mg/dL
HGB URINE DIPSTICK: NEGATIVE
KETONES UR: NEGATIVE mg/dL
Leukocytes, UA: NEGATIVE
Nitrite: NEGATIVE
PH: 7 (ref 5.0–8.0)
Protein, ur: NEGATIVE mg/dL
SPECIFIC GRAVITY, URINE: 1.01 (ref 1.005–1.030)
UROBILINOGEN UA: 0.2 mg/dL (ref 0.0–1.0)

## 2014-02-18 MED ORDER — POLYMYXIN B-TRIMETHOPRIM 10000-0.1 UNIT/ML-% OP SOLN: OPHTHALMIC | Status: DC

## 2014-02-18 NOTE — Progress Notes (Signed)
Patient ID: Kristin Logan, female   DOB: March 30, 1932, 80 y.o.   MRN: [id number]   Subjective:    Patient ID: Kristin Logan, female    DOB: Mar 23, 1932, 80 y.o.   MRN: [id number]  Patient presents for Eye issues and possible UTI  Pt here with swelling of left upper eyelid for past 3 days, also had yellow drainage yesterday and matting of eye, states eye feels irritated, her daughter had a recent eye infection and was near mother as well.  Dysuria, urinary frequency and pressure for past week, also had has increased incontinence past few weeks, leaks urine especially at night wears depends.no fever, abd pain. Continues to have chronic knee and occasional back pain, no recent falls     Review Of Systems:  GEN- denies fatigue, fever, weight loss,weakness, recent illness HEENT- +eye drainage, change in vision, nasal discharge, CVS- denies chest pain, palpitations RESP- denies SOB, cough, wheeze ABD- denies N/V, change in stools, abd pain GU- +dysuria, denies hematuria, dribbling, incontinence MSK- +oint pain, muscle aches, injury Neuro- denies headache, dizziness, syncope, seizure activity       Objective:    BP 148/82  Pulse 64  Temp(Src) 98.3 F (36.8 C) (Oral)  Resp 20  Wt 204 lb (92.534 kg) GEN- NAD, alert and oriented x 3 ,sitting in wheelchair, well appearing HEENT-PEERL, EOMI , oropharynx clear, MMM,  TM clear bilat , left upper eyelid-stye palpated with surrounding erythema, mild erythema of conjunctiva, no drainage, sclera clear CVS- irregular rhythem, normal rate  2/6 SEM RESP-, CTAB  ABD-NABS,soft,NT,ND, no CVA tenderness Ext- no swelling Pulse- Radial 2+,           Assessment & Plan:      Problem List Items Addressed This Visit   Urinary incontinence - if culture negative for infection, start Myrbetriq,better SE profile than other anti-cholinergics   Relevant Medications      mirabegron ER (MYRBETRIQ) 25 MG TB24 tablet    Other Visit Diagnoses   Stye external    -  Primary    Apply warm compreses, since she had discharge and his eye risk for infection, cover for conjunctiviitis as well with Polytrim x 5 days    Dysuria        Relevant Orders       Urinalysis, Routine w reflex microscopic (Completed)       Urine culture       Note: This dictation was prepared with Dragon dictation along with smaller phrase technology. Any transcriptional errors that result from this process are unintentional.

## 2014-02-18 NOTE — Patient Instructions (Addendum)
Apply warm compress to eye three times a day for Stye Use drops  Continue all other medications We will call if culture grows anything. Try the myrbetriq F/U as previous

## 2014-02-19 ENCOUNTER — Ambulatory Visit (INDEPENDENT_AMBULATORY_CARE_PROVIDER_SITE_OTHER): Payer: Medicare Other | Admitting: Nurse Practitioner

## 2014-02-19 ENCOUNTER — Encounter: Payer: Self-pay | Admitting: Nurse Practitioner

## 2014-02-19 VITALS — BP 153/65 | HR 48 | Ht 64.5 in | Wt 204.0 lb

## 2014-02-19 DIAGNOSIS — R269 Unspecified abnormalities of gait and mobility: Secondary | ICD-10-CM

## 2014-02-19 DIAGNOSIS — I619 Nontraumatic intracerebral hemorrhage, unspecified: Secondary | ICD-10-CM

## 2014-02-19 DIAGNOSIS — G3184 Mild cognitive impairment, so stated: Secondary | ICD-10-CM

## 2014-02-19 NOTE — Progress Notes (Signed)
PATIENT: Kristin Logan DOB: May 31, 1932  REASON FOR VISIT: routine stroke follow up HISTORY FROM: patient  HISTORY OF PRESENT ILLNESS: 89 year lady seen for first office followup visit from hospital admission intracerebral hemorrhage in November 2014. She presented with sudden onset of confusion the night before followed by being found on the floor having fallen backwards. She had it injected on herself. She presented with left-sided weakness. CT scan of the head on admission showed her 2 x 4 x 2.3 cm right miscellaneous intracerebral hemorrhage she was on warfarin with INR of 3.2 and blood pressure was elevated. She was admitted to the intensive care unit and INR was reversed per FEIBA protocol and blood pressure was tightly controlled. Volume of hemorrhage was 25 cubic CC. Followup CT scan showed stable appearance of the hemorrhage without significant hydrocephalus. Left hemiparesis improved. She was transferred for rehabilitation and has made steady improvement. She was subsequently started on baby aspirin history of atrial fibrillation. She is presently living at home with her son and daughter-in-law. She is able to ambulate with a wheeled walker. Her balance is good and she's had no recent falls. She's had persistent memory difficulties and cognitive impairment since the hemorrhage. She needs help with some apical daily living. She complains of numbness in her feet as well as some dizziness particularly when she gets up and walks. She does have diabetes it appears to be under good control. She's finished her therapy but has not been referred for outpatient therapy yet. She was found to have a 6.5 mm asymptomatic anterior communicating artery aneurysm which had been followed conservatively over the years.   UPDATE 02/19/14 (LL):  Patient returns for stroke followup last appointment was 11/06/2013.  Since last visit she has entered back into physical therapy to try to improve her gait and balance  also to strengthen her left side. She denies any falls at home. Since last visit she had an outbreak of shingles along the left shoulder and left deltoid with residual neuropathic pain.  She was given Percocet for pain for this reason and had increased confusion and hallucinations with this medication. Currently she is taking extra strength Tylenol. Her daughter-in-law who accompanies her today states that she continues to progressively have more cognitive difficulties and memory problems. We discussed starting on Aricept but patient does not want to do that at this time. Blood pressure is elevated at visit today, is153/65, daughter states that it seems to be elevated every time there at the doctor's office.  ROS:  14 system review of systems is positive for incontinence of urine, frequency or urination, memory loss, dizziness, headache, joint pain, muscle cramps, daytime sleepiness, confusion, decreased concentration    ALLERGIES: Allergies  Allergen Reactions  . Coumadin [Warfarin Sodium] Other (See Comments)    Patient had ICH  . Nsaids     Bleeding ulcer  . Codeine Nausea And Vomiting  . Adhesive [Tape] Rash    Redness, and peeling skin off.     HOME MEDICATIONS: Outpatient Prescriptions Prior to Visit  Medication Sig Dispense Refill  . acetaminophen (TYLENOL) 650 MG CR tablet Take 1 tablet (650 mg total) by mouth every 8 (eight) hours as needed for pain.      Marland Kitchen aspirin EC 81 MG tablet Take 1 tablet (81 mg total) by mouth daily.      . clotrimazole-betamethasone (LOTRISONE) cream Apply 1 application topically 2 (two) times daily.  30 g  1  . esomeprazole (NEXIUM) 20 MG  capsule Take 20 mg by mouth daily at 12 noon.      . gabapentin (NEURONTIN) 300 MG capsule Take 1 capsule (300 mg total) by mouth 2 (two) times daily.  60 capsule  2  . levothyroxine (SYNTHROID, LEVOTHROID) 75 MCG tablet Take 75 mcg by mouth daily before breakfast.      . mirabegron ER (MYRBETRIQ) 25 MG TB24 tablet Take  25 mg by mouth daily.      Marland Kitchen nystatin (MYCOSTATIN/NYSTOP) 100000 UNIT/GM POWD Apply to affected areas three times a day  60 g  3  . polyethylene glycol powder (GLYCOLAX/MIRALAX) powder Take 17 g by mouth daily.  3350 g  6  . predniSONE (DELTASONE) 5 MG tablet Take 10 mg by mouth daily with breakfast.      . simvastatin (ZOCOR) 20 MG tablet Take 1 tablet (20 mg total) by mouth at bedtime.  30 tablet  6  . trimethoprim-polymyxin b (POLYTRIM) ophthalmic solution 1 drop QID  X 5 days  10 mL  0  . lidocaine (LIDODERM) 5 % Place 1 patch onto the skin daily. Remove & Discard patch within 12 hours or as directed by MD  30 patch  1   No facility-administered medications prior to visit.     PHYSICAL EXAM  Filed Vitals:   02/19/14 1503  BP: 153/65  Pulse: 48  Height: 5' 4.5" (1.638 m)  Weight: 204 lb (92.534 kg)   Body mass index is 34.49 kg/(m^2). No exam data present   Physical Exam  General: well developed, well nourished, seated, in no evident distress  Head: head normocephalic and atraumatic. Orohparynx benign, chalazion on left upper eyelid Neck: supple with no carotid or supraclavicular bruits  Cardiovascular: regular rate and rhythm, no murmurs  Musculoskeletal: no deformity  Vascular: Normal pulses all extremities   Neurologic Exam  Mental Status: Awake and fully alert. Oriented to place and time. Recent and remote memory diminishedt. Attention span, concentration and fund of knowledge poor. Mood and affect appropriate. MMSE 16/25 with deficits in orientation, recall. Unable to write and read  Cranial Nerves: Pupils equal, briskly reactive to light. Extraocular movements full without nystagmus. Visual fields full to confrontation. Hearing intact. Facial sensation intact. Face, tongue, palate moves normally and symmetrically.  Motor: Normal bulk and tone. Normal strength in all tested extremity muscles. Diminished fine finger from the left. Mild weakness of left. Orbits right over  left upper extremity. Stiffness of the left leg and weakness of ankle dorsiflexors and hip flexors  Sensory.: intact to touch and pinprick and vibratory sensation.  Coordination: Rapid alternating movements normal in all extremities. Finger-to-nose and heel-to-shin performed accurately bilaterally.  Gait and Station: Arises from chair without difficulty. Stance is normal. Gait demonstrates broad base mild imbalance . Uses a walker  Reflexes: 1+ and symmetric. Toes downgoing.   DIAGNOSTIC DATA (LABS, IMAGING, TESTING) - I reviewed patient records, labs, notes, testing and imaging myself where available.  Lab Results  Component Value Date   HGBA1C 6.0* 12/09/2013    ASSESSMENT AND PLAN 89 year lady with basal ganglia hemorrhage in November 2014 secondary to hypertension and warfarin coagulopathy  Asymptomatic 6.5 mm anterior communicating artery aneurysm was which has been stable.   PLAN:  I had a long discussion with the patient and daughter-in-law regarding her intracerebral hemorrhage, results of evaluation in the hospital, mild examination findings today and answered questions.  Continue aspirin 81 mg daily for stroke prevention given history of atrial fibrillation despite intracerebral hemorrhage. Strict  control of hypertension with blood pressure goal below 130/90 and diabetes with hemoglobin A1c: 7%.  Continue conservative followup for brain aneurysm. Hold off on medications for her vascular cognitive impairment for now. Continue physical therapy. Return for followup in 6 months or call earlier if necessary.   Philmore Pali, MSN, NP-C 02/19/2014, 3:14 PM [facility] [address] [place], [place] [address] [phone]  Note: This document was prepared with digital dictation and possible smart phrase technology. Any transcriptional errors that result from this process are unintentional.

## 2014-02-19 NOTE — Patient Instructions (Signed)
Continue aspirin 81 mg daily for stroke prevention given history of atrial fibrillation despite intracerebral hemorrhage. Strict control of hypertension with blood pressure goal below 130/90 and diabetes with hemoglobin A1c: 7%.  Continue conservative followup for brain aneurysm. Hold off on medications for her vascular cognitive impairment for now.  Return for followup in 6 months or call earlier if necessary

## 2014-02-20 ENCOUNTER — Ambulatory Visit: Payer: Medicare Other | Admitting: Physical Therapy

## 2014-02-20 DIAGNOSIS — IMO0001 Reserved for inherently not codable concepts without codable children: Secondary | ICD-10-CM | POA: Diagnosis not present

## 2014-02-20 LAB — URINE CULTURE
Colony Count: NO GROWTH
ORGANISM ID, BACTERIA: NO GROWTH

## 2014-02-24 ENCOUNTER — Ambulatory Visit: Payer: Medicare Other | Admitting: Physical Therapy

## 2014-02-24 DIAGNOSIS — IMO0001 Reserved for inherently not codable concepts without codable children: Secondary | ICD-10-CM | POA: Diagnosis not present

## 2014-02-27 ENCOUNTER — Ambulatory Visit: Payer: Medicare Other | Admitting: Physical Therapy

## 2014-02-27 DIAGNOSIS — IMO0001 Reserved for inherently not codable concepts without codable children: Secondary | ICD-10-CM | POA: Diagnosis not present

## 2014-03-02 ENCOUNTER — Ambulatory Visit: Payer: Medicare Other | Attending: Neurology

## 2014-03-02 DIAGNOSIS — R262 Difficulty in walking, not elsewhere classified: Secondary | ICD-10-CM | POA: Insufficient documentation

## 2014-03-02 DIAGNOSIS — IMO0001 Reserved for inherently not codable concepts without codable children: Secondary | ICD-10-CM | POA: Diagnosis not present

## 2014-03-02 DIAGNOSIS — I69998 Other sequelae following unspecified cerebrovascular disease: Secondary | ICD-10-CM | POA: Insufficient documentation

## 2014-03-02 DIAGNOSIS — R269 Unspecified abnormalities of gait and mobility: Secondary | ICD-10-CM | POA: Insufficient documentation

## 2014-03-06 ENCOUNTER — Ambulatory Visit: Payer: Medicare Other | Admitting: Physical Therapy

## 2014-03-06 DIAGNOSIS — IMO0001 Reserved for inherently not codable concepts without codable children: Secondary | ICD-10-CM | POA: Diagnosis not present

## 2014-03-09 ENCOUNTER — Ambulatory Visit: Payer: Medicare Other | Admitting: Physical Therapy

## 2014-03-09 DIAGNOSIS — IMO0001 Reserved for inherently not codable concepts without codable children: Secondary | ICD-10-CM | POA: Diagnosis not present

## 2014-03-11 ENCOUNTER — Encounter: Payer: Self-pay | Admitting: Family Medicine

## 2014-03-11 ENCOUNTER — Ambulatory Visit (INDEPENDENT_AMBULATORY_CARE_PROVIDER_SITE_OTHER): Payer: Medicare Other | Admitting: Family Medicine

## 2014-03-11 VITALS — BP 138/82 | HR 64 | Temp 97.5°F | Resp 18 | Wt 203.0 lb

## 2014-03-11 DIAGNOSIS — I10 Essential (primary) hypertension: Secondary | ICD-10-CM

## 2014-03-11 DIAGNOSIS — IMO0002 Reserved for concepts with insufficient information to code with codable children: Secondary | ICD-10-CM

## 2014-03-11 DIAGNOSIS — E785 Hyperlipidemia, unspecified: Secondary | ICD-10-CM

## 2014-03-11 DIAGNOSIS — E119 Type 2 diabetes mellitus without complications: Secondary | ICD-10-CM

## 2014-03-11 DIAGNOSIS — S51819A Laceration without foreign body of unspecified forearm, initial encounter: Secondary | ICD-10-CM | POA: Insufficient documentation

## 2014-03-11 DIAGNOSIS — R2681 Unsteadiness on feet: Secondary | ICD-10-CM

## 2014-03-11 DIAGNOSIS — T148XXA Other injury of unspecified body region, initial encounter: Secondary | ICD-10-CM

## 2014-03-11 DIAGNOSIS — R269 Unspecified abnormalities of gait and mobility: Secondary | ICD-10-CM

## 2014-03-11 MED ORDER — GABAPENTIN 300 MG PO CAPS: 300.0000 mg | ORAL_CAPSULE | Freq: Two times a day (BID) | ORAL | Status: DC

## 2014-03-11 NOTE — Patient Instructions (Signed)
Okay to use miralax once a day  Blood pressure cuff-  I will call in 2-3 weeks for readings Get labs done fasting with Dr. Liliane Channel bloodwork  F/U 3 months

## 2014-03-11 NOTE — Assessment & Plan Note (Signed)
Blood pressure looks okay today but I do see the elevated allergies. I've given the family prescription for a blood pressure monitor. Her blood pressures are staying above 140s at home I will start her on Norvasc 2.5 mg and see how she does

## 2014-03-11 NOTE — Assessment & Plan Note (Signed)
Diet-controlled, plan for repeat A1c with her labs for her endocrinologist in about 6 weeks

## 2014-03-11 NOTE — Assessment & Plan Note (Signed)
Small skin tear noted, applied Triple antibiotic, bandage

## 2014-03-11 NOTE — Progress Notes (Signed)
Patient ID: EUVA RUNDELL, female   DOB: September 06, 1932, 80 y.o.   MRN: [id number]   Subjective:    Patient ID: [name], female    DOB: 05-Nov-1931, 80 y.o.   MRN: [id number]  Patient presents for 2 month F/U and fall  patient here follow chronic medical problems. She was seen a few weeks ago secondary to conjunctivitis and a sty of her left side that is now resolved. She's also having increased urinary difficulties he never started the Mytrebique and things seem tests stabilized anyway. She is are ready wearing depends. She did have a fall on Monday seen she was bending over to reach for something while seated in her rolling walker in a [place] from underneath her she has an abrasion to her right arm which the family has been treating with Neosporin and a bandage but otherwise no other injuries. She still in physical therapy though this will be completed this week. She was seen by neurology since our last visit her blood pressure was a little elevated in the 150s at that visit she's not on any blood pressure medication secondary to episodes of hypotension. Regarding her diabetes mellitus is diet controlled her last A1c was 6% which was 3 months ago.    Review Of Systems:  GEN- denies fatigue, fever, weight loss,weakness, recent illness HEENT- denies eye drainage, change in vision, nasal discharge, CVS- denies chest pain, palpitations RESP- denies SOB, cough, wheeze ABD- denies N/V, change in stools, abd pain GU- denies dysuria, hematuria, dribbling, incontinence MSK- + joint pain, muscle aches, injury Neuro- denies headache, dizziness, syncope, seizure activity       Objective:    BP 138/82  Pulse 64  Temp(Src) 97.5 F (36.4 C) (Oral)  Resp 18  Wt 203 lb (92.08 kg) GEN- NAD, alert and oriented x 3 ,sitting in wheelchair, well appearing HEENT-PEERL, EOMI , oropharynx clear, MMM,  TM clear bilat ,  sclera clear CVS- irregular rhythem, normal rate  2/6 SEM RESP-, CTAB   ABD-NABS,soft,NT,ND, no CVA tenderness Ext- trace left pedal edema, foot no open lesions, Skin- ecchymosis on right forearm, small skin tear 1cm, no drainage, small area of ecchymosis on dorsum left foot, NT, no hematoma palpable Pulse- Radial 2+,DP equal bilat    Assessment & Plan:      Problem List Items Addressed This Visit   None      Note: This dictation was prepared with Dragon dictation along with smaller phrase technology. Any transcriptional errors that result from this process are unintentional.

## 2014-03-11 NOTE — Assessment & Plan Note (Signed)
She is on statin drugs history of stroke diabetes mellitus will recheck lipid panel

## 2014-03-13 ENCOUNTER — Ambulatory Visit: Payer: Medicare Other

## 2014-03-13 DIAGNOSIS — IMO0001 Reserved for inherently not codable concepts without codable children: Secondary | ICD-10-CM | POA: Diagnosis not present

## 2014-04-23 ENCOUNTER — Telehealth: Payer: Self-pay | Admitting: Family Medicine

## 2014-04-23 NOTE — Telephone Encounter (Signed)
Patients family member Janace Hoard is calling to speak with dr Buelah Manis about evangelene wanting to move out on her own in to an apartment please call angie back at  [phone]

## 2014-04-23 NOTE — Telephone Encounter (Signed)
Returned call to [employer]   Reports that patient is wanting to move out since she has had some improvements and is unhappy in current home.   States that MD had advised before that patient was not appropriate to live alone.   [facility] program is able to provide 6 hours of nursing care per day and patient family is planning on checking on her periodically through the week.   Requested to have MD recommendations.

## 2014-04-24 NOTE — Telephone Encounter (Signed)
I spoke with pt, she is upset that she cant go home Based on her chronic medical problems, previous declines with living alone, I recommend she stay with her family. They are unable to pay for nursing for 24 hours care and she declines SNF

## 2014-04-24 NOTE — Telephone Encounter (Signed)
Okay to do CAP form

## 2014-04-24 NOTE — Telephone Encounter (Signed)
Call placed to [name] to make aware of MD recommendations.   States that she agrees that patient would not be safe to live alone without 24/7 care from nursing, and patient/ family cannot afford this.  States that she would like MD to discuss with patient to make aware.

## 2014-04-24 NOTE — Telephone Encounter (Signed)
CAP is okay during the day, but she needs supervision at night at well The family would have to pay out of pocket for a nurse for this as not covered by insurance You can give them the name of some nursing agencies- [facility]

## 2014-05-28 ENCOUNTER — Ambulatory Visit (INDEPENDENT_AMBULATORY_CARE_PROVIDER_SITE_OTHER): Payer: Medicare Other | Admitting: Physician Assistant

## 2014-05-28 ENCOUNTER — Encounter: Payer: Self-pay | Admitting: Physician Assistant

## 2014-05-28 VITALS — BP 128/64 | HR 64 | Temp 98.5°F | Resp 14 | Wt 218.0 lb

## 2014-05-28 DIAGNOSIS — R609 Edema, unspecified: Secondary | ICD-10-CM

## 2014-05-28 DIAGNOSIS — R600 Localized edema: Secondary | ICD-10-CM

## 2014-05-28 MED ORDER — HYDROCHLOROTHIAZIDE 12.5 MG PO CAPS
12.5000 mg | ORAL_CAPSULE | Freq: Every day | ORAL | Status: DC
Start: 2014-05-28 — End: 2014-06-09

## 2014-05-28 NOTE — Progress Notes (Signed)
Patient ID: Kristin Logan MRN: [id number], DOB: 11/12/1931, 80 y.o. Date of Encounter: 05/28/2014, 2:44 PM    Chief Complaint:  Chief Complaint  Patient presents with  . BLE edema    x3 days- 1+ pitting edema to BLE- states tops of feet and legs are hurting     HPI: 80 y.o. year old white female is here with her daughter-in-law for visit today. She usually sees Dr. Buelah Logan but she is out this week. Patient says that she has an appointment to see Dr. Buelah Logan 06/12/14. However says that she has been having some swelling in her feet the last few days and thought she should go ahead and come in and have this evaluated rather than wait until that appointment.  Daughter-in-law says that she saw patient's feet yesterday and they were a lot more swollen yesterday than they are today. Patient elevated her feet last night and says that they got much better with this.  Patient reports no shortness of breath or orthopnea. No chest congestion or cough.     Home Meds:   Outpatient Prescriptions Prior to Visit  Medication Sig Dispense Refill  . acetaminophen (TYLENOL) 650 MG CR tablet Take 1 tablet (650 mg total) by mouth every 8 (eight) hours as needed for pain.      Marland Kitchen aspirin EC 81 MG tablet Take 1 tablet (81 mg total) by mouth daily.      . clotrimazole-betamethasone (LOTRISONE) cream Apply 1 application topically 2 (two) times daily.  30 g  1  . diclofenac sodium (VOLTAREN) 1 % GEL Apply 2 g topically 4 (four) times daily as needed (arthritis pain to knees).      Marland Kitchen esomeprazole (NEXIUM) 20 MG capsule Take 20 mg by mouth daily at 12 noon.      . gabapentin (NEURONTIN) 300 MG capsule Take 1 capsule (300 mg total) by mouth 2 (two) times daily.  60 capsule  6  . levothyroxine (SYNTHROID, LEVOTHROID) 75 MCG tablet Take 75 mcg by mouth daily before breakfast.      . nystatin (MYCOSTATIN/NYSTOP) 100000 UNIT/GM POWD Apply to affected areas three times a day  60 g  3  . polyethylene glycol powder  (GLYCOLAX/MIRALAX) powder Take 17 g by mouth daily.  3350 g  6  . predniSONE (DELTASONE) 5 MG tablet Take 10 mg by mouth daily with breakfast.      . simvastatin (ZOCOR) 20 MG tablet Take 1 tablet (20 mg total) by mouth at bedtime.  30 tablet  6   No facility-administered medications prior to visit.    Allergies:  Allergies  Allergen Reactions  . Coumadin [Warfarin Sodium] Other (See Comments)    Patient had ICH  . Nsaids     Bleeding ulcer  . Codeine Nausea And Vomiting  . Adhesive [Tape] Rash    Redness, and peeling skin off.       Review of Systems: See HPI for pertinent ROS. All other ROS negative.    Physical Exam: Blood pressure 128/64, pulse 64, temperature 98.5 F (36.9 C), temperature source Oral, resp. rate 14, weight 218 lb (98.884 kg)., Body mass index is 36.86 kg/(m^2). General:  Obese WF. Appears in no acute distress. Neck: Supple. No thyromegaly. No lymphadenopathy. Lungs: Clear bilaterally to auscultation without wheezes, rales, or rhonchi. Breathing is unlabored. Heart: Irregular Rythym.  Msk:  Strength and tone normal for age. Extremities/Skin: Trace Edema with palpation of dorsum of feet. 1+ pitting edema just above malleolus level. No  edema further up than ankle level.  Psych:  Responds to questions appropriately with a normal affect.     ASSESSMENT AND PLAN:  80 y.o. year old female with  1. Bilateral lower extremity edema CMET normal 12/30/2013. Will add low-dose diuretic to take each morning. Told her to be careful about sodium intake and to have a low sodium diet. Recommend elevating her feet. Keep her appointment to followup with Dr. Buelah Logan on 06/12/14. Followup sooner if swelling worsens. - hydrochlorothiazide (MICROZIDE) 12.5 MG capsule; Take 1 capsule (12.5 mg total) by mouth daily.  Dispense: 30 capsule; Refill: 0   Signed, [address] [place], [place], [facility] 05/28/2014 2:44 PM

## 2014-06-05 ENCOUNTER — Telehealth: Payer: Self-pay | Admitting: *Deleted

## 2014-06-05 NOTE — Telephone Encounter (Signed)
Call placed to patient and patient daughter in law [name] made aware.

## 2014-06-05 NOTE — Telephone Encounter (Signed)
Received call from patient daughter, Kristin Logan.   Reports that patient was seen on 05/28/2014 by PA for BLE edema. New prescription for HCTZ 12.5mg  was given.   States that patient has been taking medication, but B feet have swollen again. States that R foot is discolored and almost looks as if it is bruised (slightly purple).   No available appointments open today, but F/U appointment is scheduled for 06/09/2014.  Advised to have patient elevate lower extremities and take prescribed HCTZ.   MD please advise.

## 2014-06-05 NOTE — Telephone Encounter (Signed)
Agree to elevate the legs. Have her take 2 of the HCTZ throughout the weekend to see if this helps with the swelling

## 2014-06-09 ENCOUNTER — Ambulatory Visit (INDEPENDENT_AMBULATORY_CARE_PROVIDER_SITE_OTHER): Payer: Medicare Other | Admitting: Family Medicine

## 2014-06-09 ENCOUNTER — Encounter: Payer: Self-pay | Admitting: Family Medicine

## 2014-06-09 VITALS — BP 128/72 | HR 62 | Temp 98.4°F | Resp 16 | Wt 221.0 lb

## 2014-06-09 DIAGNOSIS — R609 Edema, unspecified: Secondary | ICD-10-CM

## 2014-06-09 DIAGNOSIS — R600 Localized edema: Secondary | ICD-10-CM | POA: Insufficient documentation

## 2014-06-09 DIAGNOSIS — I4891 Unspecified atrial fibrillation: Secondary | ICD-10-CM

## 2014-06-09 DIAGNOSIS — I482 Chronic atrial fibrillation, unspecified: Secondary | ICD-10-CM

## 2014-06-09 MED ORDER — FUROSEMIDE 40 MG PO TABS: 40.0000 mg | ORAL_TABLET | Freq: Every day | ORAL | Status: DC | PRN

## 2014-06-09 MED ORDER — POTASSIUM CHLORIDE ER 10 MEQ PO TBCR: 10.0000 meq | EXTENDED_RELEASE_TABLET | Freq: Every day | ORAL | Status: DC

## 2014-06-09 MED ORDER — POLYETHYLENE GLYCOL 3350 17 GM/SCOOP PO POWD: 17.0000 g | Freq: Every day | ORAL | Status: DC

## 2014-06-09 NOTE — Progress Notes (Signed)
Patient ID: Kristin Logan, female   DOB: 06/16/1932, 80 y.o.   MRN: [id number]   Subjective:    Patient ID: Kristin Logan, female    DOB: 1932/05/22, 80 y.o.   MRN: [id number]  Patient presents for Edema  patient here secondary to some leg swelling. She was seen a week ago that time started on hydrochlorothiazide 12.5 mg we had an interim phone call we increased this to 25 mg she's had some decrease in the edema but still has considerable discomfort as well as some discoloration on her toes. Her daughter-in-law states that her legs look like they're going to burst. Of note an interim since her last visit Kristin Logan has went back to her home with her Services despite my recommendation as well as her family's not to live alone.  She denies any chest pain or shortness of breath she does have history of chronic atrial fibrillation. She does state that she stopped her right foot on something the other day and has been bruise since then   Review Of Systems:  GEN- denies fatigue, fever, weight loss,weakness, recent illness HEENT- denies eye drainage, change in vision, nasal discharge, CVS- denies chest pain, palpitations RESP- denies SOB, cough, wheeze ABD- denies N/V, change in stools, abd pain MSK- + joint pain, muscle aches, injury Neuro- denies headache, dizziness, syncope, seizure activity       Objective:    BP 128/72  Pulse 62  Temp(Src) 98.4 F (36.9 C) (Oral)  Resp 16  Wt 221 lb (100.245 kg) GEN- NAD, alert and oriented x 3 ,sitting in wheelchair, well appearing HEENT-PEERL, EOMI , oropharynx clear, MMM,  TM clear bilat ,  sclera clear CVS- irregular rhythem, normal rate  2/6 SEM RESP-, CTAB  ABD-NABS,soft,NT,ND, no CVA tenderness Ext- 1+ pitting edema to shins bilat Skin- ecchymosis on right forearm, ecchymosis on right foot 3rd-5th digits Pulse- Radial 2+,DP diminished bilat     Assessment & Plan:      Problem List Items Addressed This Visit   None      Note:  This dictation was prepared with Dragon dictation along with smaller phrase technology. Any transcriptional errors that result from this process are unintentional.

## 2014-06-09 NOTE — Assessment & Plan Note (Signed)
I will change her to Lasix 40 mg with 10 mEq of potassium we will do this for the next week we will followup by phone on Friday to see if her fluid levels have decreased. She has gained 3 pounds at the last visit last week she is up about 10 pounds and her endocrinology visit which was just about a month ago. Do not see any evidence of overt heart failure differently she can be treated as an outpatient

## 2014-06-09 NOTE — Patient Instructions (Signed)
Lasix 40mg  once a day with potassium Stop the HCTZ  We will call on Friday  F/u as previous

## 2014-06-09 NOTE — Assessment & Plan Note (Signed)
Rate controlled at baseline. She is on aspirin

## 2014-06-12 ENCOUNTER — Ambulatory Visit: Payer: Medicare Other | Admitting: Family Medicine

## 2014-07-26 ENCOUNTER — Emergency Department (HOSPITAL_COMMUNITY)
Admission: EM | Admit: 2014-07-26 | Discharge: 2014-07-26 | Disposition: A | Discharge status: Home / Self Care | Payer: Medicare Other | Attending: Emergency Medicine | Admitting: Emergency Medicine

## 2014-07-26 ENCOUNTER — Encounter (HOSPITAL_COMMUNITY): Payer: Self-pay | Admitting: Emergency Medicine

## 2014-07-26 DIAGNOSIS — Z9981 Dependence on supplemental oxygen: Secondary | ICD-10-CM | POA: Diagnosis not present

## 2014-07-26 DIAGNOSIS — G8929 Other chronic pain: Secondary | ICD-10-CM | POA: Insufficient documentation

## 2014-07-26 DIAGNOSIS — Z8781 Personal history of (healed) traumatic fracture: Secondary | ICD-10-CM | POA: Diagnosis not present

## 2014-07-26 DIAGNOSIS — Z79899 Other long term (current) drug therapy: Secondary | ICD-10-CM | POA: Insufficient documentation

## 2014-07-26 DIAGNOSIS — S99912A Unspecified injury of left ankle, initial encounter: Secondary | ICD-10-CM | POA: Diagnosis present

## 2014-07-26 DIAGNOSIS — I4891 Unspecified atrial fibrillation: Secondary | ICD-10-CM | POA: Diagnosis not present

## 2014-07-26 DIAGNOSIS — E785 Hyperlipidemia, unspecified: Secondary | ICD-10-CM | POA: Diagnosis not present

## 2014-07-26 DIAGNOSIS — Z8673 Personal history of transient ischemic attack (TIA), and cerebral infarction without residual deficits: Secondary | ICD-10-CM | POA: Diagnosis not present

## 2014-07-26 DIAGNOSIS — Z8711 Personal history of peptic ulcer disease: Secondary | ICD-10-CM | POA: Insufficient documentation

## 2014-07-26 DIAGNOSIS — G4730 Sleep apnea, unspecified: Secondary | ICD-10-CM | POA: Insufficient documentation

## 2014-07-26 DIAGNOSIS — M170 Bilateral primary osteoarthritis of knee: Secondary | ICD-10-CM | POA: Diagnosis not present

## 2014-07-26 DIAGNOSIS — Z923 Personal history of irradiation: Secondary | ICD-10-CM | POA: Insufficient documentation

## 2014-07-26 DIAGNOSIS — S91012A Laceration without foreign body, left ankle, initial encounter: Secondary | ICD-10-CM | POA: Diagnosis not present

## 2014-07-26 DIAGNOSIS — I10 Essential (primary) hypertension: Secondary | ICD-10-CM | POA: Insufficient documentation

## 2014-07-26 DIAGNOSIS — Y9289 Other specified places as the place of occurrence of the external cause: Secondary | ICD-10-CM | POA: Diagnosis not present

## 2014-07-26 DIAGNOSIS — Y9389 Activity, other specified: Secondary | ICD-10-CM | POA: Insufficient documentation

## 2014-07-26 DIAGNOSIS — K219 Gastro-esophageal reflux disease without esophagitis: Secondary | ICD-10-CM | POA: Insufficient documentation

## 2014-07-26 DIAGNOSIS — Z7982 Long term (current) use of aspirin: Secondary | ICD-10-CM | POA: Diagnosis not present

## 2014-07-26 DIAGNOSIS — Z853 Personal history of malignant neoplasm of breast: Secondary | ICD-10-CM | POA: Insufficient documentation

## 2014-07-26 DIAGNOSIS — Z7952 Long term (current) use of systemic steroids: Secondary | ICD-10-CM | POA: Diagnosis not present

## 2014-07-26 DIAGNOSIS — W208XXA Other cause of strike by thrown, projected or falling object, initial encounter: Secondary | ICD-10-CM | POA: Insufficient documentation

## 2014-07-26 DIAGNOSIS — Z862 Personal history of diseases of the blood and blood-forming organs and certain disorders involving the immune mechanism: Secondary | ICD-10-CM | POA: Insufficient documentation

## 2014-07-26 DIAGNOSIS — Z9011 Acquired absence of right breast and nipple: Secondary | ICD-10-CM | POA: Diagnosis not present

## 2014-07-26 DIAGNOSIS — Z7901 Long term (current) use of anticoagulants: Secondary | ICD-10-CM | POA: Insufficient documentation

## 2014-07-26 NOTE — Discharge Instructions (Signed)
Keep original dressing on for 48 hours. Then change daily. Elevate foot.

## 2014-07-26 NOTE — ED Provider Notes (Signed)
CSN: [id number]     Arrival date & time 07/26/14  2158 History   First MD Initiated Contact with Patient 07/26/14 2247     Chief Complaint  Patient presents with  . Leg Injury     (Consider location/radiation/quality/duration/timing/severity/associated sxs/prior Treatment) HPI..... Skin tear left lateral ankle this evening after her purse dropped on ankle. No other injuries. Patient is susceptible to skin tears. Severity is mild.  Past Medical History  Diagnosis Date  . Diabetes mellitus   . Hyperlipidemia   . Eosinophilia   . Cataract   . Hypertension   . Atrial fibrillation prior to 2008    moderate biatrial enlargement in 2009; mild to moderate LVH with a low-normal EF  . GERD (gastroesophageal reflux disease)     Hemorrhoids; history of peptic ulcer disease  . DJD (degenerative joint disease) of knee   . Chronic anticoagulation   . Aortic valve disease     2009: mild stenosis and regurgitation; peak gradient of 30-35 mmHg , subsequent AVR at [facility]  . Incontinence     Mild  . Hip fracture, left 12/12/2012  . Foot injury 06/25/2012  . Degenerative joint disease of knee 01/29/2008    Bilateral    . BREAST CANCER, HX OF 01/29/2008    Lumpectomy, radiation therapy  Arimidex stopped in July 2013    . Syncope and collapse 11/11/2012  . Diverticulosis   . Umbilical hernia   . Cholelithiasis   . DDD (degenerative disc disease), lumbar   . Vertebral compression fracture     lumbar  . Chronic pain   . Paresthesias     feet  . Gastroparesis   . Sleep apnea     CPAP  . Carcinoma of breast     Right mastectomy; radiation and hormonal therapy  . Stroke 2014    Hemorrhagic stroke    Past Surgical History  Procedure Laterality Date  . Exploration post operative open heart    . Colonoscopy      Approximately 2000  . Cardiac valve replacement      [facility]  . Cardiac valve replacement    . Breast surgery    . Breast lumpectomy    . Esophagogastroduodenoscopy N/A 02/20/2013   Procedure: ESOPHAGOGASTRODUODENOSCOPY (EGD);  Surgeon: Rogene Houston, MD;  Location: [facility];  Service: Endoscopy;  Laterality: N/A;  . Mastectomy     Family History  Problem Relation Age of Onset  . Heart disease Mother   . Stroke Father   . Cancer Sister   . Heart disease Sister   . Stroke Sister   . Stroke Brother    History  Substance Use Topics  . Smoking status: Never Smoker   . Smokeless tobacco: Never Used  . Alcohol Use: No   OB History   Grav Para Term Preterm Abortions TAB SAB Ect Mult Living                 Review of Systems  All other systems reviewed and are negative.     Allergies  Coumadin; Nsaids; Codeine; and Adhesive  Home Medications   Prior to Admission medications   Medication Sig Start Date End Date Taking? Authorizing Provider  acetaminophen (TYLENOL) 650 MG CR tablet Take 1,300 mg by mouth every 8 (eight) hours as needed for pain. 01/21/13  Yes Samuella Cota, MD  aspirin EC 81 MG tablet Take 1 tablet (81 mg total) by mouth daily. 09/01/13  Yes Abelina Bachelor, PA-C  clotrimazole-betamethasone (LOTRISONE) cream Apply 1 application topically 2 (two) times daily. 10/18/13  Yes Alycia Rossetti, MD  diclofenac sodium (VOLTAREN) 1 % GEL Apply 2 g topically 4 (four) times daily as needed (arthritis pain to knees).   Yes Historical Provider, MD  esomeprazole (NEXIUM) 20 MG capsule Take 20 mg by mouth daily at 12 noon.   Yes Historical Provider, MD  furosemide (LASIX) 40 MG tablet Take 80 mg by mouth daily. 06/09/14  Yes Alycia Rossetti, MD  gabapentin (NEURONTIN) 300 MG capsule Take 1 capsule (300 mg total) by mouth 2 (two) times daily. 03/11/14  Yes Alycia Rossetti, MD  levothyroxine (SYNTHROID, LEVOTHROID) 75 MCG tablet Take 75 mcg by mouth daily before breakfast. 08/25/13  Yes Radene Gunning, NP  nystatin (MYCOSTATIN/NYSTOP) 100000 UNIT/GM POWD Apply to affected areas three times a day 12/09/13  Yes Alycia Rossetti, MD  polyethylene glycol powder  (GLYCOLAX/MIRALAX) powder Take 17 g by mouth daily. 06/09/14  Yes Alycia Rossetti, MD  potassium chloride (K-DUR) 10 MEQ tablet Take 1 tablet (10 mEq total) by mouth daily. 06/09/14  Yes Alycia Rossetti, MD  predniSONE (DELTASONE) 5 MG tablet Take 10 mg by mouth daily with breakfast. 10/01/13  Yes Alycia Rossetti, MD  simvastatin (ZOCOR) 20 MG tablet Take 1 tablet (20 mg total) by mouth at bedtime. 01/28/13  Yes Alycia Rossetti, MD   BP 143/75  Pulse 87  Temp(Src) 97.6 F (36.4 C) (Oral)  Resp 16  Ht 5\' 6"  (1.676 m)  Wt 200 lb (90.719 kg)  BMI 32.30 kg/m2  SpO2 96% Physical Exam  Constitutional: She is oriented to person, place, and time. She appears well-developed and well-nourished.  Eyes: EOM are normal.  Musculoskeletal: Normal range of motion.  Neurological: She is alert and oriented to person, place, and time.  Skin:  Left ankle: Skin tear approximately 2.5 x 4 CM.  no bony tenderness  Psychiatric: She has a normal mood and affect. Her behavior is normal.    ED Course  Procedures (including critical care time) Labs Review Labs Reviewed - No data to display  Imaging Review No results found.   EKG Interpretation None      MDM   Final diagnoses:  Laceration of left ankle, initial encounter    No imaging necessary. Nurse will apply sterile dressing.    Nat Christen, MD 07/26/14 [phone]

## 2014-07-26 NOTE — ED Notes (Signed)
Pt c/o left leg skin tear above left ankle.

## 2014-08-05 ENCOUNTER — Encounter (HOSPITAL_COMMUNITY): Payer: Self-pay | Admitting: Emergency Medicine

## 2014-08-05 ENCOUNTER — Emergency Department (HOSPITAL_COMMUNITY)
Admission: EM | Admit: 2014-08-05 | Discharge: 2014-08-05 | Disposition: A | Discharge status: Home / Self Care | Payer: Medicare Other | Attending: Emergency Medicine | Admitting: Emergency Medicine

## 2014-08-05 DIAGNOSIS — Z952 Presence of prosthetic heart valve: Secondary | ICD-10-CM | POA: Insufficient documentation

## 2014-08-05 DIAGNOSIS — Z9981 Dependence on supplemental oxygen: Secondary | ICD-10-CM | POA: Diagnosis not present

## 2014-08-05 DIAGNOSIS — Z791 Long term (current) use of non-steroidal anti-inflammatories (NSAID): Secondary | ICD-10-CM | POA: Diagnosis not present

## 2014-08-05 DIAGNOSIS — Z853 Personal history of malignant neoplasm of breast: Secondary | ICD-10-CM | POA: Insufficient documentation

## 2014-08-05 DIAGNOSIS — G4730 Sleep apnea, unspecified: Secondary | ICD-10-CM | POA: Diagnosis not present

## 2014-08-05 DIAGNOSIS — Z8673 Personal history of transient ischemic attack (TIA), and cerebral infarction without residual deficits: Secondary | ICD-10-CM | POA: Insufficient documentation

## 2014-08-05 DIAGNOSIS — M549 Dorsalgia, unspecified: Secondary | ICD-10-CM | POA: Insufficient documentation

## 2014-08-05 DIAGNOSIS — I499 Cardiac arrhythmia, unspecified: Secondary | ICD-10-CM | POA: Diagnosis not present

## 2014-08-05 DIAGNOSIS — G8929 Other chronic pain: Secondary | ICD-10-CM | POA: Diagnosis not present

## 2014-08-05 DIAGNOSIS — Z4801 Encounter for change or removal of surgical wound dressing: Secondary | ICD-10-CM | POA: Diagnosis not present

## 2014-08-05 DIAGNOSIS — Z87828 Personal history of other (healed) physical injury and trauma: Secondary | ICD-10-CM | POA: Diagnosis not present

## 2014-08-05 DIAGNOSIS — E119 Type 2 diabetes mellitus without complications: Secondary | ICD-10-CM | POA: Insufficient documentation

## 2014-08-05 DIAGNOSIS — Z7952 Long term (current) use of systemic steroids: Secondary | ICD-10-CM | POA: Diagnosis not present

## 2014-08-05 DIAGNOSIS — K219 Gastro-esophageal reflux disease without esophagitis: Secondary | ICD-10-CM | POA: Insufficient documentation

## 2014-08-05 DIAGNOSIS — H269 Unspecified cataract: Secondary | ICD-10-CM | POA: Insufficient documentation

## 2014-08-05 DIAGNOSIS — R011 Cardiac murmur, unspecified: Secondary | ICD-10-CM | POA: Diagnosis not present

## 2014-08-05 DIAGNOSIS — Z8781 Personal history of (healed) traumatic fracture: Secondary | ICD-10-CM | POA: Insufficient documentation

## 2014-08-05 DIAGNOSIS — I10 Essential (primary) hypertension: Secondary | ICD-10-CM | POA: Diagnosis not present

## 2014-08-05 DIAGNOSIS — Z7982 Long term (current) use of aspirin: Secondary | ICD-10-CM | POA: Diagnosis not present

## 2014-08-05 DIAGNOSIS — E785 Hyperlipidemia, unspecified: Secondary | ICD-10-CM | POA: Insufficient documentation

## 2014-08-05 DIAGNOSIS — Z862 Personal history of diseases of the blood and blood-forming organs and certain disorders involving the immune mechanism: Secondary | ICD-10-CM | POA: Insufficient documentation

## 2014-08-05 DIAGNOSIS — Z5189 Encounter for other specified aftercare: Secondary | ICD-10-CM

## 2014-08-05 NOTE — ED Notes (Signed)
Pt seen and evaluated by EDPa for initial assessment. 

## 2014-08-05 NOTE — ED Notes (Signed)
Dressing applied to L lower leg/ankle. Non stick Telfa, Kling and tape used. Tape is placed on the [place].

## 2014-08-05 NOTE — Discharge Instructions (Signed)
The wound to your left lower leg is progressing nicely. You have new granulation tissue that is present and healing nicely. It is important that you keep your legs elevated above your waist is much as possible to prevent some of the swelling. Please cleanse the wound with soap and water daily. Please apply a warm compress to the wound area 2 or 3 times daily. Please see Dr. Buelah Manis, or return to the emergency department if any pus like drainage, red streaks going up the leg, high fever, excessive pain, or changes in your general condition. Wound Check Your wound appears healthy today. Your wound will heal gradually over time. Eventually a scar will form that will fade with time. FACTORS THAT AFFECT SCAR FORMATION:  People differ in the severity in which they scar.  Scar severity varies according to location, size, and the traits you inherited from your parents (genetic predisposition).  Irritation to the wound from infection, rubbing, or chemical exposure will increase the amount of scar formation. HOME CARE INSTRUCTIONS   If you were given a dressing, you should change it at least once a day or as instructed by your caregiver. If the bandage sticks, soak it off with a solution of hydrogen peroxide.  If the bandage becomes wet, dirty, or develops a bad smell, change it as soon as possible.  Look for signs of infection.  Only take over-the-counter or prescription medicines for pain, discomfort, or fever as directed by your caregiver. SEEK IMMEDIATE MEDICAL CARE IF:   You have redness, swelling, or increasing pain in the wound.  You notice pus coming from the wound.  You have a fever.  You notice a bad smell coming from the wound or dressing. Document Released: 06/24/2004 Document Revised: 12/11/2011 Document Reviewed: 09/18/2005 [facility] Patient Information 2015 [place], [place]. This information is not intended to replace advice given to you by your health care provider. Make sure you  discuss any questions you have with your health care provider.

## 2014-08-05 NOTE — ED Provider Notes (Signed)
  Face-to-face evaluation   History: she is here for evaluation of the wound, sustained 10/25.  Family members are concerned that the wound is not healing and may be infected.  Physical exam:left lower medial leg, just above ankle, gaping wound healing by granulation, mild amount of contained blood underneath the flap, which is dark in appearance.  No significant erythema or induration.  No proximal streaking.  Medical screening examination/treatment/procedure(s) were conducted as a shared visit with non-physician practitioner(s) and myself.  I personally evaluated the patient during the encounter  Richarda Blade, MD 08/07/14 2340

## 2014-08-05 NOTE — ED Notes (Signed)
Pt reports seen for same on 11/25. Pt diagnosed with LLE laceration. Pt family brought pt back after pt re-injured skin. No active bleeding noted. Small abrasion/contusion noted to LLE. Moderate swelling noted to LLE. Pt denies being on any blood thinners.

## 2014-08-05 NOTE — ED Provider Notes (Signed)
CSN: [id number]     Arrival date & time 08/05/14  1500 History   First MD Initiated Contact with Patient 08/05/14 1526     Chief Complaint  Patient presents with  . Abrasion     (Consider location/radiation/quality/duration/timing/severity/associated sxs/prior Treatment) HPI Comments: Patient is an 80 year old female who presents to the emergency department for recheck of a wound to the left leg. The patient was seen in the emergency department on October 25 at which time she had sustained a skin tear and abrasion to the left lower leg. The wound was bandaged. Patient was given instructions for care of the wound. The patient presents tonight at the request of her children because of some color changes that were present. They were also concerned about the patient's progression of healing. There's been no high fevers reported. No pus like drainage. The patient complains of soreness, but no frank pain. No other lesions appreciated.  The history is provided by the patient and a relative.    Past Medical History  Diagnosis Date  . Diabetes mellitus   . Hyperlipidemia   . Eosinophilia   . Cataract   . Hypertension   . Atrial fibrillation prior to 2008    moderate biatrial enlargement in 2009; mild to moderate LVH with a low-normal EF  . GERD (gastroesophageal reflux disease)     Hemorrhoids; history of peptic ulcer disease  . DJD (degenerative joint disease) of knee   . Chronic anticoagulation   . Aortic valve disease     2009: mild stenosis and regurgitation; peak gradient of 30-35 mmHg , subsequent AVR at [facility]  . Incontinence     Mild  . Hip fracture, left 12/12/2012  . Foot injury 06/25/2012  . Degenerative joint disease of knee 01/29/2008    Bilateral    . BREAST CANCER, HX OF 01/29/2008    Lumpectomy, radiation therapy  Arimidex stopped in July 2013    . Syncope and collapse 11/11/2012  . Diverticulosis   . Umbilical hernia   . Cholelithiasis   . DDD (degenerative disc disease),  lumbar   . Vertebral compression fracture     lumbar  . Chronic pain   . Paresthesias     feet  . Gastroparesis   . Sleep apnea     CPAP  . Carcinoma of breast     Right mastectomy; radiation and hormonal therapy  . Stroke 2014    Hemorrhagic stroke    Past Surgical History  Procedure Laterality Date  . Exploration post operative open heart    . Colonoscopy      Approximately 2000  . Cardiac valve replacement      [facility]  . Cardiac valve replacement    . Breast surgery    . Breast lumpectomy    . Esophagogastroduodenoscopy N/A 02/20/2013    Procedure: ESOPHAGOGASTRODUODENOSCOPY (EGD);  Surgeon: Rogene Houston, MD;  Location: [facility];  Service: Endoscopy;  Laterality: N/A;  . Mastectomy     Family History  Problem Relation Age of Onset  . Heart disease Mother   . Stroke Father   . Cancer Sister   . Heart disease Sister   . Stroke Sister   . Stroke Brother    History  Substance Use Topics  . Smoking status: Never Smoker   . Smokeless tobacco: Never Used  . Alcohol Use: No   OB History    No data available     Review of Systems  Constitutional: Negative for activity change.  All ROS Neg except as noted in HPI  Eyes: Negative for photophobia and discharge.  Respiratory: Negative for cough, shortness of breath and wheezing.   Cardiovascular: Positive for palpitations. Negative for chest pain.  Gastrointestinal: Negative for abdominal pain and blood in stool.  Genitourinary: Negative for dysuria, frequency and hematuria.  Musculoskeletal: Positive for back pain and arthralgias. Negative for neck pain.  Skin: Negative.   Neurological: Negative for dizziness, seizures and speech difficulty.  Psychiatric/Behavioral: Negative for hallucinations and confusion.      Allergies  Coumadin; Nsaids; Codeine; and Adhesive  Home Medications   Prior to Admission medications   Medication Sig Start Date End Date Taking? Authorizing Provider  acetaminophen  (TYLENOL) 650 MG CR tablet Take 1,300 mg by mouth every 8 (eight) hours as needed for pain. 01/21/13   Samuella Cota, MD  aspirin EC 81 MG tablet Take 1 tablet (81 mg total) by mouth daily. 09/01/13   Abelina Bachelor, PA-C  clotrimazole-betamethasone (LOTRISONE) cream Apply 1 application topically 2 (two) times daily. 10/18/13   Alycia Rossetti, MD  diclofenac sodium (VOLTAREN) 1 % GEL Apply 2 g topically 4 (four) times daily as needed (arthritis pain to knees).    Historical Provider, MD  esomeprazole (NEXIUM) 20 MG capsule Take 20 mg by mouth daily at 12 noon.    Historical Provider, MD  furosemide (LASIX) 40 MG tablet Take 80 mg by mouth daily. 06/09/14   Alycia Rossetti, MD  gabapentin (NEURONTIN) 300 MG capsule Take 1 capsule (300 mg total) by mouth 2 (two) times daily. 03/11/14   Alycia Rossetti, MD  levothyroxine (SYNTHROID, LEVOTHROID) 75 MCG tablet Take 75 mcg by mouth daily before breakfast. 08/25/13   Radene Gunning, NP  nystatin (MYCOSTATIN/NYSTOP) 100000 UNIT/GM POWD Apply to affected areas three times a day 12/09/13   Alycia Rossetti, MD  polyethylene glycol powder (GLYCOLAX/MIRALAX) powder Take 17 g by mouth daily. 06/09/14   Alycia Rossetti, MD  potassium chloride (K-DUR) 10 MEQ tablet Take 1 tablet (10 mEq total) by mouth daily. 06/09/14   Alycia Rossetti, MD  predniSONE (DELTASONE) 5 MG tablet Take 10 mg by mouth daily with breakfast. 10/01/13   Alycia Rossetti, MD  simvastatin (ZOCOR) 20 MG tablet Take 1 tablet (20 mg total) by mouth at bedtime. 01/28/13   Alycia Rossetti, MD   BP 145/74 mmHg  Pulse 92  Temp(Src) 97.9 F (36.6 C) (Oral)  Resp 18  Ht 5\' 5"  (1.651 m)  Wt 250 lb (113.399 kg)  BMI 41.60 kg/m2  SpO2 98% Physical Exam  Constitutional: She is oriented to person, place, and time. She appears well-developed and well-nourished.  Non-toxic appearance.  HENT:  Head: Normocephalic.  Right Ear: Tympanic membrane and external ear normal.  Left Ear: Tympanic membrane and  external ear normal.  Eyes: EOM and lids are normal. Pupils are equal, round, and reactive to light.  Neck: Normal range of motion. Neck supple. Carotid bruit is not present.  Cardiovascular: Normal rate, intact distal pulses and normal pulses.  An irregularly irregular rhythm present.  Murmur heard. Pulmonary/Chest: Breath sounds normal. No respiratory distress.  Abdominal: Soft. Bowel sounds are normal. There is no tenderness. There is no guarding.  Musculoskeletal: Normal range of motion.  There is a bruise to the left elbow without broken skin area. The patient has full range of motion of the left elbow, wrist and fingers.  There is a healing wound to the medial aspect  of the left ankle. Patient has evidence of a previous skin tear/laceration/abrasion. There is good granulation tissue present. There are some black to dark areas in the granulation tissue present that suggests bruise blood. There is some bruising around the site. Mild swelling present. The area is not hot. There is no red streaks appreciated. There no temperature changes when the left lower extremity as compared to the right. The dorsalis pedis pulses 1+ on the left. The capillary refill is less than 3 seconds. There no lesions noted between the toes. And there no puncture wounds or lesions appreciated involving the plantar surface.  Lymphadenopathy:       Head (right side): No submandibular adenopathy present.       Head (left side): No submandibular adenopathy present.    She has no cervical adenopathy.  Neurological: She is alert and oriented to person, place, and time. She has normal strength. No cranial nerve deficit or sensory deficit.  Skin: Skin is warm and dry.  Psychiatric: She has a normal mood and affect. Her speech is normal.  Nursing note and vitals reviewed.   ED Course  Patient seen with me by Dr. Eulis Foster.  Procedures (including critical care time) Labs Review Labs Reviewed - No data to display  Imaging  Review No results found.   EKG Interpretation None      MDM  Vital signs are well within normal limits. There no temperature changes when the right lower extremity is compared to the left extremity. There is no red streaking appreciated. Doubt evidence of infection. There is granulating tissue. Suspect some delay in healing related to circulation and patient's age and the type of skin tear wound at the patient sustained.  Advised the family that the wound is healing at its expected progression. Family reassured that no evidence of infection or cellulitis appreciated at this time. They are encouraged to cleanse the wound with soap and water daily and to apply warm compress to her 3 times daily. They patient is further advised to keep the foot elevated above her waist for assistance with the swelling. They are to see the primary physician or return to the emergency department if any changes or problems.   Final diagnoses:  None    **I have reviewed nursing notes, vital signs, and all appropriate lab and imaging results for this patient.Lenox Ahr, PA-C 08/05/14 Rosa Sanchez, MD 08/07/14 [phone]

## 2014-08-10 ENCOUNTER — Encounter: Payer: Self-pay | Admitting: Family Medicine

## 2014-08-10 ENCOUNTER — Ambulatory Visit (INDEPENDENT_AMBULATORY_CARE_PROVIDER_SITE_OTHER): Payer: Medicare Other | Admitting: Family Medicine

## 2014-08-10 VITALS — BP 132/78 | HR 76 | Temp 98.3°F | Resp 18 | Ht 65.0 in | Wt 225.0 lb

## 2014-08-10 DIAGNOSIS — E119 Type 2 diabetes mellitus without complications: Secondary | ICD-10-CM

## 2014-08-10 DIAGNOSIS — E2740 Unspecified adrenocortical insufficiency: Secondary | ICD-10-CM

## 2014-08-10 DIAGNOSIS — Z23 Encounter for immunization: Secondary | ICD-10-CM

## 2014-08-10 DIAGNOSIS — N3946 Mixed incontinence: Secondary | ICD-10-CM

## 2014-08-10 DIAGNOSIS — G3184 Mild cognitive impairment, so stated: Secondary | ICD-10-CM

## 2014-08-10 MED ORDER — MIRABEGRON ER 25 MG PO TB24: 25.0000 mg | ORAL_TABLET | Freq: Every day | ORAL | Status: DC

## 2014-08-10 NOTE — Assessment & Plan Note (Signed)
Diet controlled, no change to meds

## 2014-08-10 NOTE — Progress Notes (Signed)
Patient ID: Kristin Logan, female   DOB: [birth year], 80 y.o.   MRN: [id number]   Subjective:    Patient ID: Kristin Logan, female    DOB: Mar 18, 1932, 80 y.o.   MRN: [id number]  Patient presents for 2 month F/U; Bladder Incontinence; and Skin Tear A she here for interim follow-up. She was seen in the emergency room after a skin tear to her left lower leg she actually caught it on her scooter at home. Unfortunately she is back at home she does have a few hours with [facility] services however it is not around-the-clock.  It is noted that she has missed doses of her medication she actually missed her prednisone 4 days in a row and at that time she was actually low altered other this was not addressed when she was at the emergency room. She is also concerned about increasing incontinence she seems to not allow the CNA nurses to help clean her up for her daughter-in-law. They like to try the medication for her bladder to see if this helps.   Review Of Systems:  GEN- denies fatigue, fever, weight loss,weakness, recent illness HEENT- denies eye drainage, change in vision, nasal discharge, CVS- denies chest pain, palpitations RESP- denies SOB, cough, wheeze ABD- denies N/V, change in stools, abd pain GU- denies dysuria, hematuria, dribbling,+ incontinence MSK- denies joint pain, muscle aches, injury Neuro- denies headache, dizziness, syncope, seizure activity       Objective:    BP 132/78 mmHg  Pulse 76  Temp(Src) 98.3 F (36.8 C) (Oral)  Resp 18  Ht 5\' 5"  (1.651 m)  Wt 225 lb (102.059 kg)  BMI 37.44 kg/m2 GEN- NAD, alert and oriented x 3 ,sitting in wheelchair, well appearing HEENT-PEERL, EOMI , oropharynx clear, MMM,  TM clear bilat ,  sclera clear CVS- irregular rhythem, normal rate  2/6 SEM RESP-, CTAB  ABD-NABS,soft,NT,ND, no CVA tenderness Ext- 1+ pedal edema  Skin- skin tear 2x3cm left lower leg, granulation tissue, no drainage, no odor Pulse- Radial 2+,DP diminished  bilat        Assessment & Plan:      Problem List Items Addressed This Visit    Urinary incontinence   Relevant Medications      mirabegron (MYRBETRIQ) ER tablet   MCI (mild cognitive impairment)   Depression - Primary   Adrenal insufficiency   Relevant Orders      CBC with Differential      Comprehensive metabolic panel    Other Visit Diagnoses    Diabetes mellitus type 2, controlled        Relevant Orders       Hemoglobin A1c       LDL Cholesterol, Direct    Need for prophylactic vaccination and inoculation against influenza        Relevant Orders       Flu Vaccine QUAD 36+ mos PF IM (Fluarix Quad PF) (Completed)       Note: This dictation was prepared with Dragon dictation along with smaller phrase technology. Any transcriptional errors that result from this process are unintentional.

## 2014-08-10 NOTE — Assessment & Plan Note (Signed)
She needs 24 hour care, she is very reluctant to do this and does not want to consider SNF, i discussed with her daughter in law that it will only be a matter of time where she has an event at her home that will cause signficant decline whether this is an illness or fall. Pt states she will consider moving back with her son. Family unable to afford 24 hour nursing

## 2014-08-10 NOTE — Assessment & Plan Note (Signed)
Trial or myribetriq 25mg  at bedtime

## 2014-08-10 NOTE — Assessment & Plan Note (Signed)
Recheck Sodium levels

## 2014-08-10 NOTE — Patient Instructions (Addendum)
Flu shot given Take medications as prescribed Try the myrnetriq at bedtime We will call with lab results F/U 4 weeks

## 2014-08-11 LAB — CBC WITH DIFFERENTIAL/PLATELET
Basophils Absolute: 0.1 10*3/uL (ref 0.0–0.1)
Basophils Relative: 1 % (ref 0–1)
EOS ABS: 1.6 10*3/uL — AB (ref 0.0–0.7)
Eosinophils Relative: 14 % — ABNORMAL HIGH (ref 0–5)
HCT: 38.3 % (ref 36.0–46.0)
HEMOGLOBIN: 12.9 g/dL (ref 12.0–15.0)
Lymphocytes Relative: 29 % (ref 12–46)
Lymphs Abs: 3.3 10*3/uL (ref 0.7–4.0)
MCH: 30.3 pg (ref 26.0–34.0)
MCHC: 33.7 g/dL (ref 30.0–36.0)
MCV: 89.9 fL (ref 78.0–100.0)
MONO ABS: 0.7 10*3/uL (ref 0.1–1.0)
MONOS PCT: 6 % (ref 3–12)
NEUTROS PCT: 50 % (ref 43–77)
Neutro Abs: 5.8 10*3/uL (ref 1.7–7.7)
Platelets: 313 10*3/uL (ref 150–400)
RBC: 4.26 MIL/uL (ref 3.87–5.11)
RDW: 13.6 % (ref 11.5–15.5)
WBC: 11.5 10*3/uL — ABNORMAL HIGH (ref 4.0–10.5)

## 2014-08-11 LAB — COMPREHENSIVE METABOLIC PANEL
ALT: 13 U/L (ref 0–35)
AST: 28 U/L (ref 0–37)
Albumin: 3.6 g/dL (ref 3.5–5.2)
Alkaline Phosphatase: 67 U/L (ref 39–117)
BILIRUBIN TOTAL: 0.5 mg/dL (ref 0.2–1.2)
BUN: 9 mg/dL (ref 6–23)
CO2: 27 mEq/L (ref 19–32)
Calcium: 8.6 mg/dL (ref 8.4–10.5)
Chloride: 103 mEq/L (ref 96–112)
Creat: 0.66 mg/dL (ref 0.50–1.10)
GLUCOSE: 137 mg/dL — AB (ref 70–99)
Potassium: 3.7 mEq/L (ref 3.5–5.3)
Sodium: 141 mEq/L (ref 135–145)
Total Protein: 6 g/dL (ref 6.0–8.3)

## 2014-08-11 LAB — HEMOGLOBIN A1C
Hgb A1c MFr Bld: 6.2 % — ABNORMAL HIGH (ref <5.7)
MEAN PLASMA GLUCOSE: 131 mg/dL — AB (ref <117)

## 2014-08-11 LAB — LDL CHOLESTEROL, DIRECT: Direct LDL: 48 mg/dL

## 2014-08-13 ENCOUNTER — Ambulatory Visit (INDEPENDENT_AMBULATORY_CARE_PROVIDER_SITE_OTHER): Payer: Medicare Other | Admitting: Neurology

## 2014-08-13 ENCOUNTER — Encounter: Payer: Self-pay | Admitting: Neurology

## 2014-08-13 VITALS — BP 138/84

## 2014-08-13 DIAGNOSIS — I610 Nontraumatic intracerebral hemorrhage in hemisphere, subcortical: Secondary | ICD-10-CM

## 2014-08-13 NOTE — Patient Instructions (Addendum)
I had a long discussion with the patient and her  2 daughters regarding her remote intracerebral hemorrhage and mild vascular dementia which appears stable. The patient continues to refuse Aricept or even treatment for depression. Continue aspirin for stroke prevention and maintain strict control of hypertension with blood pressure goal below 130/90 and lipids with LDL cholesterol goal below 70 mg percent. Return for follow-up in 6 months or call earlier if necessary

## 2014-08-13 NOTE — Progress Notes (Signed)
PATIENT: Kristin Logan DOB: 07-24-1932  REASON FOR VISIT: routine stroke follow up HISTORY FROM: patient  HISTORY OF PRESENT ILLNESS: 64 year lady with right basal ganglia 2 x 4 x 2.3 cm  intracerebral hemorrhage in November 2014 related to hypertension and  warfarin coagulopathy  And h/o atrial fibrillation.  She was found to have a 6.5 mm asymptomatic anterior communicating artery aneurysm which had been followed conservatively over the years.   UPDATE 02/19/14 (LL):  Patient returns for stroke followup last appointment was 11/06/2013.  Since last visit she has entered back into physical therapy to try to improve her gait and balance also to strengthen her left side. She denies any falls at home. Since last visit she had an outbreak of shingles along the left shoulder and left deltoid with residual neuropathic pain.  She was given Percocet for pain for this reason and had increased confusion and hallucinations with this medication. Currently she is taking extra strength Tylenol. Her daughter-in-law who accompanies her today states that she continues to progressively have more cognitive difficulties and memory problems. We discussed starting on Aricept but patient does not want to do that at this time. Blood pressure is elevated at visit today, is153/65, daughter states that it seems to be elevated every time there at the doctor's office. Update 08/13/2014 : She returns for follow-up after last visit 6 months ago. She is accompanied by her daughters. Patient has recently moved into her own apartment now. She does have a nurse who comes in the mornings and evenings to help her. She uses a wheeled walker. She is at no recent falls. She is at some swelling in her legs and has been started on Lasix which has helped but she is having bladder control issues. Her blood pressure is well controlled and it is 138/8 for an office today. Patient continues to have memory and cognitive problems but refuses to  try medications for that. She does have a long-standing history of depression in the past and had done well on Paxil but of late has not been on any medicines.   ROS:  14 system review of systems is positive for incontinence of urine, frequency or urination, memory loss, leg swelling, excessive thirst, difficulty urinating, joint pain, insomnia, frequent walking, neck pain, wounds, rash, confusion, headache  ALLERGIES: Allergies  Allergen Reactions  . Coumadin [Warfarin Sodium] Other (See Comments)    Patient had ICH  . Nsaids Other (See Comments)    Bleeding ulcer  . Codeine Nausea And Vomiting  . Adhesive [Tape] Rash    Redness, and peeling skin off.     HOME MEDICATIONS: Outpatient Prescriptions Prior to Visit  Medication Sig Dispense Refill  . acetaminophen (TYLENOL) 650 MG CR tablet Take 1,300 mg by mouth every 8 (eight) hours as needed for pain.    Marland Kitchen aspirin EC 81 MG tablet Take 1 tablet (81 mg total) by mouth daily.    . clotrimazole-betamethasone (LOTRISONE) cream Apply 1 application topically 2 (two) times daily. 30 g 1  . diclofenac sodium (VOLTAREN) 1 % GEL Apply 2 g topically 4 (four) times daily as needed (arthritis pain to knees).    Marland Kitchen esomeprazole (NEXIUM) 20 MG capsule Take 20 mg by mouth daily at 12 noon.    . furosemide (LASIX) 40 MG tablet Take 80 mg by mouth daily.    Marland Kitchen gabapentin (NEURONTIN) 300 MG capsule Take 1 capsule (300 mg total) by mouth 2 (two) times daily. 60 capsule 6  .  levothyroxine (SYNTHROID, LEVOTHROID) 75 MCG tablet Take 75 mcg by mouth daily before breakfast.    . mirabegron ER (MYRBETRIQ) 25 MG TB24 tablet Take 1 tablet (25 mg total) by mouth daily. 30 tablet   . nystatin (MYCOSTATIN/NYSTOP) 100000 UNIT/GM POWD Apply to affected areas three times a day 60 g 3  . polyethylene glycol powder (GLYCOLAX/MIRALAX) powder Take 17 g by mouth daily.    . potassium chloride (K-DUR) 10 MEQ tablet Take 1 tablet (10 mEq total) by mouth daily. 30 tablet 1  .  predniSONE (DELTASONE) 5 MG tablet Take 10 mg by mouth daily with breakfast.    . simvastatin (ZOCOR) 20 MG tablet Take 1 tablet (20 mg total) by mouth at bedtime. 30 tablet 6   No facility-administered medications prior to visit.     PHYSICAL EXAM  Filed Vitals:   08/13/14 1616  BP: 138/84   There is no weight on file to calculate BMI. No exam data present   Physical Exam  General: obese elderly Caucasian aldy seated, in no evident distress  Head: head normocephalic and atraumatic.    chalazion on left upper eyelid Neck: supple with no carotid or supraclavicular bruits  Cardiovascular: regular rate and rhythm, no murmurs  Musculoskeletal: no deformity  Vascular: Normal pulses all extremities   Neurologic Exam  Mental Status: Awake and fully alert. Oriented to place and time. Recent and remote memory diminishedt. Attention span, concentration and fund of knowledge poor. Mood and affect appropriate. MMSE 15/30 with deficits in orientation, recall. Unable to write and read .animal naming 12. Clock drawing 4/4. Geriatric depression scale 6 suggestive of mild depression Cranial Nerves: Pupils equal, briskly reactive to light. Extraocular movements full without nystagmus. Visual fields full to confrontation. Hearing intact. Facial sensation intact. Face, tongue, palate moves normally and symmetrically.  Motor: Normal bulk and tone. Normal strength in all tested extremity muscles. Diminished fine finger from the left. Mild weakness of left. Orbits right over left upper extremity. Stiffness of the left leg and weakness of ankle dorsiflexors and hip flexors  Sensory.: intact to touch and pinprick and vibratory sensation.  Coordination: Rapid alternating movements normal in all extremities. Finger-to-nose and heel-to-shin performed accurately bilaterally.  Gait and Station: Arises from chair without difficulty. Stance is normal. Gait demonstrates broad base mild imbalance . Uses a walker    Reflexes: 1+ and symmetric. Toes downgoing.   DIAGNOSTIC DATA (LABS, IMAGING, TESTING) - I reviewed patient records, labs, notes, testing and imaging myself where available.  Lab Results  Component Value Date   HGBA1C 6.2* 08/10/2014    ASSESSMENT AND PLAN 75 year lady with basal ganglia hemorrhage in November 2014 secondary to hypertension and warfarin coagulopathy  Asymptomatic 6.5 mm anterior communicating artery aneurysm    PLAN:  I had a long discussion with the patient and her  2 daughters regarding her remote intracerebral hemorrhage and mild vascular dementia which appears stable. The patient continues to refuse Aricept or even treatment for depression. Continue aspirin for stroke prevention and maintain strict control of hypertension with blood pressure goal below 130/90 and lipids with LDL cholesterol goal below 70 mg percent. Return for follow-up in 6 months or call earlier if necessary   Antony Contras, MD  08/13/2014, 4:43 PM [facility] [address], [place], [place] [address] [phone]  Note: This document was prepared with digital dictation and possible smart phrase technology. Any transcriptional errors that result from this process are unintentional.

## 2014-08-17 ENCOUNTER — Other Ambulatory Visit: Payer: Self-pay | Admitting: Family Medicine

## 2014-08-18 NOTE — Telephone Encounter (Signed)
Refill appropriate and filled per protocol. 

## 2014-08-24 ENCOUNTER — Ambulatory Visit: Payer: Medicare Other | Admitting: Nurse Practitioner

## 2014-09-04 ENCOUNTER — Other Ambulatory Visit: Payer: Self-pay | Admitting: Family Medicine

## 2014-09-04 NOTE — Telephone Encounter (Signed)
Medication refilled per protocol. 

## 2014-09-07 ENCOUNTER — Ambulatory Visit (INDEPENDENT_AMBULATORY_CARE_PROVIDER_SITE_OTHER): Payer: Medicare Other | Admitting: Family Medicine

## 2014-09-07 ENCOUNTER — Encounter: Payer: Self-pay | Admitting: Family Medicine

## 2014-09-07 VITALS — BP 136/74 | HR 78 | Temp 98.0°F | Resp 18 | Ht 65.0 in | Wt 223.0 lb

## 2014-09-07 DIAGNOSIS — M25512 Pain in left shoulder: Secondary | ICD-10-CM

## 2014-09-07 DIAGNOSIS — R296 Repeated falls: Secondary | ICD-10-CM

## 2014-09-07 DIAGNOSIS — R609 Edema, unspecified: Secondary | ICD-10-CM

## 2014-09-07 DIAGNOSIS — N3946 Mixed incontinence: Secondary | ICD-10-CM

## 2014-09-07 DIAGNOSIS — S51811A Laceration without foreign body of right forearm, initial encounter: Secondary | ICD-10-CM

## 2014-09-07 DIAGNOSIS — M25519 Pain in unspecified shoulder: Secondary | ICD-10-CM | POA: Insufficient documentation

## 2014-09-07 NOTE — Assessment & Plan Note (Signed)
tegaderm applied after wound care in office

## 2014-09-07 NOTE — Assessment & Plan Note (Signed)
Chronic left shoulder pain, recent fall, ROM at baseline, no bruising or deformity noted Prefer not to give narcotics due to AMS in past Given Tylenol BID scheduled

## 2014-09-07 NOTE — Progress Notes (Signed)
Patient ID: Kristin Logan, female   DOB: 12/14/1931, 80 y.o.   MRN: [id number]   Subjective:    Patient ID: Kristin Logan, female    DOB: [birth year], 80 y.o.   MRN: [id number]  Patient presents for 4 week F/U and L elbow injury  patient here for interim follow-up. She was seen 4 weeks ago. At that time I started her on Mirabetriq or for her bladder incontinence however she's not noticed a difference with it.  She's also had 2 falls since I seen her she has an aide who is therefore couple hours in the morning and in the evening they're making sure that she is getting her medication however she was alone when she had the falls one resulted in abrasion to her right arm as well as a bruise to her right leg he other she has significant bruising on her left elbow and she reinjured her left shoulder which she has had chronic pain in. She did take Tylenol which has helped her pain. Each time she states that her knees, gave out on her she did have her walker with her. She still declines moving back in with her family.    Review Of Systems:  GEN- denies fatigue, fever, weight loss,weakness, recent illness HEENT- denies eye drainage, change in vision, nasal discharge, CVS- denies chest pain, palpitations RESP- denies SOB, cough, wheeze ABD- denies N/V, change in stools, abd pain GU- denies dysuria, hematuria, dribbling,+ incontinence MSK- +joint pain, muscle aches, injury Neuro- denies headache, dizziness, syncope, seizure activity       Objective:    BP 136/74 mmHg  Pulse 78  Temp(Src) 98 F (36.7 C) (Oral)  Resp 18  Ht 5\' 5"  (1.651 m)  Wt 223 lb (101.152 kg)  BMI 37.11 kg/m2 GEN- NAD, alert and oriented x 3 ,sitting in wheelchair, well appearing HEENT-PEERL, EOMI , oropharynx clear, MMM,  TM clear bilat ,  sclera clear CVS- irregular rhythem, normal rate  2/6 SEM RESP-, CTAB  Ext- 1+ pedal edema  Skin-  2x2 scab left lower leg previous skin tear, semicircular skin tear right  forearm, large bruise across left elbow and forearm, small bruise right shin MSK- GOOD ROM of the elbow, NT, no hematoma, fair ROM left shoulder- unchanged Pulse- Radial 2+,DP diminished bilat      Assessment & Plan:      Problem List Items Addressed This Visit    None    Visit Diagnoses    Frequent falls    -  Primary    Relevant Orders       Consult to [facility] (Completed)       Note: This dictation was prepared with Dragon dictation along with smaller phrase technology. Any transcriptional errors that result from this process are unintentional.

## 2014-09-07 NOTE — Assessment & Plan Note (Signed)
D/c myrebetriq, continue depends

## 2014-09-07 NOTE — Assessment & Plan Note (Signed)
Lasix 40mg  BID  X 3 days, elevate feet

## 2014-09-07 NOTE — Patient Instructions (Signed)
Add Tylenol to morning and night medications [facility] referral   Lasix 2 pills for next 3 days  Stop the Mybetriq for the bladder  Continue all other medications F/U 3 months

## 2014-09-07 NOTE — Assessment & Plan Note (Signed)
Pt at home which I have discussed with family and pt, I do not think this is the safest place for her Her family can not afford 24 hour nursing She does not want SNF [facility] referral, see if social worker can intervene, would also benefit from another set of eyes in the home for safety reasons

## 2014-09-11 ENCOUNTER — Telehealth: Payer: Self-pay | Admitting: Neurology

## 2014-09-11 ENCOUNTER — Emergency Department (HOSPITAL_COMMUNITY): Payer: Medicare Other

## 2014-09-11 ENCOUNTER — Inpatient Hospital Stay (HOSPITAL_COMMUNITY): Payer: Medicare Other

## 2014-09-11 ENCOUNTER — Encounter (HOSPITAL_COMMUNITY): Payer: Self-pay | Admitting: Emergency Medicine

## 2014-09-11 ENCOUNTER — Inpatient Hospital Stay (HOSPITAL_COMMUNITY)
Admission: EM | Admit: 2014-09-11 | Discharge: 2014-09-15 | DRG: 603 | Disposition: A | Discharge status: Skilled Nursing Facility | Payer: Medicare Other | Attending: Internal Medicine | Admitting: Internal Medicine

## 2014-09-11 ENCOUNTER — Telehealth: Payer: Self-pay | Admitting: Family Medicine

## 2014-09-11 DIAGNOSIS — N390 Urinary tract infection, site not specified: Secondary | ICD-10-CM | POA: Diagnosis present

## 2014-09-11 DIAGNOSIS — Z7952 Long term (current) use of systemic steroids: Secondary | ICD-10-CM

## 2014-09-11 DIAGNOSIS — I482 Chronic atrial fibrillation: Secondary | ICD-10-CM | POA: Diagnosis present

## 2014-09-11 DIAGNOSIS — E785 Hyperlipidemia, unspecified: Secondary | ICD-10-CM | POA: Diagnosis present

## 2014-09-11 DIAGNOSIS — M7121 Synovial cyst of popliteal space [Baker], right knee: Secondary | ICD-10-CM | POA: Diagnosis present

## 2014-09-11 DIAGNOSIS — Z853 Personal history of malignant neoplasm of breast: Secondary | ICD-10-CM

## 2014-09-11 DIAGNOSIS — Z6841 Body Mass Index (BMI) 40.0 and over, adult: Secondary | ICD-10-CM | POA: Diagnosis not present

## 2014-09-11 DIAGNOSIS — M170 Bilateral primary osteoarthritis of knee: Secondary | ICD-10-CM | POA: Diagnosis present

## 2014-09-11 DIAGNOSIS — E039 Hypothyroidism, unspecified: Secondary | ICD-10-CM | POA: Diagnosis present

## 2014-09-11 DIAGNOSIS — E2740 Unspecified adrenocortical insufficiency: Secondary | ICD-10-CM | POA: Diagnosis present

## 2014-09-11 DIAGNOSIS — E876 Hypokalemia: Secondary | ICD-10-CM | POA: Diagnosis present

## 2014-09-11 DIAGNOSIS — T502X5A Adverse effect of carbonic-anhydrase inhibitors, benzothiadiazides and other diuretics, initial encounter: Secondary | ICD-10-CM | POA: Diagnosis present

## 2014-09-11 DIAGNOSIS — Z8249 Family history of ischemic heart disease and other diseases of the circulatory system: Secondary | ICD-10-CM

## 2014-09-11 DIAGNOSIS — R296 Repeated falls: Secondary | ICD-10-CM | POA: Diagnosis present

## 2014-09-11 DIAGNOSIS — K3184 Gastroparesis: Secondary | ICD-10-CM | POA: Diagnosis present

## 2014-09-11 DIAGNOSIS — Z66 Do not resuscitate: Secondary | ICD-10-CM | POA: Diagnosis present

## 2014-09-11 DIAGNOSIS — R9431 Abnormal electrocardiogram [ECG] [EKG]: Secondary | ICD-10-CM

## 2014-09-11 DIAGNOSIS — Z809 Family history of malignant neoplasm, unspecified: Secondary | ICD-10-CM

## 2014-09-11 DIAGNOSIS — E669 Obesity, unspecified: Secondary | ICD-10-CM | POA: Diagnosis present

## 2014-09-11 DIAGNOSIS — D72829 Elevated white blood cell count, unspecified: Secondary | ICD-10-CM | POA: Diagnosis present

## 2014-09-11 DIAGNOSIS — Z8673 Personal history of transient ischemic attack (TIA), and cerebral infarction without residual deficits: Secondary | ICD-10-CM | POA: Diagnosis not present

## 2014-09-11 DIAGNOSIS — R55 Syncope and collapse: Secondary | ICD-10-CM

## 2014-09-11 DIAGNOSIS — E119 Type 2 diabetes mellitus without complications: Secondary | ICD-10-CM | POA: Diagnosis present

## 2014-09-11 DIAGNOSIS — Z7982 Long term (current) use of aspirin: Secondary | ICD-10-CM | POA: Diagnosis not present

## 2014-09-11 DIAGNOSIS — Z791 Long term (current) use of non-steroidal anti-inflammatories (NSAID): Secondary | ICD-10-CM | POA: Diagnosis not present

## 2014-09-11 DIAGNOSIS — I10 Essential (primary) hypertension: Secondary | ICD-10-CM | POA: Diagnosis present

## 2014-09-11 DIAGNOSIS — Z823 Family history of stroke: Secondary | ICD-10-CM | POA: Diagnosis not present

## 2014-09-11 DIAGNOSIS — R52 Pain, unspecified: Secondary | ICD-10-CM

## 2014-09-11 DIAGNOSIS — K219 Gastro-esophageal reflux disease without esophagitis: Secondary | ICD-10-CM | POA: Diagnosis present

## 2014-09-11 DIAGNOSIS — G4730 Sleep apnea, unspecified: Secondary | ICD-10-CM | POA: Diagnosis present

## 2014-09-11 DIAGNOSIS — M7120 Synovial cyst of popliteal space [Baker], unspecified knee: Secondary | ICD-10-CM | POA: Diagnosis present

## 2014-09-11 DIAGNOSIS — B9620 Unspecified Escherichia coli [E. coli] as the cause of diseases classified elsewhere: Secondary | ICD-10-CM | POA: Diagnosis present

## 2014-09-11 DIAGNOSIS — Z79899 Other long term (current) drug therapy: Secondary | ICD-10-CM

## 2014-09-11 DIAGNOSIS — Z952 Presence of prosthetic heart valve: Secondary | ICD-10-CM | POA: Diagnosis not present

## 2014-09-11 DIAGNOSIS — Z923 Personal history of irradiation: Secondary | ICD-10-CM

## 2014-09-11 DIAGNOSIS — L03115 Cellulitis of right lower limb: Secondary | ICD-10-CM | POA: Diagnosis present

## 2014-09-11 DIAGNOSIS — A419 Sepsis, unspecified organism: Secondary | ICD-10-CM

## 2014-09-11 HISTORY — DX: Chronic atrial fibrillation, unspecified: I48.20

## 2014-09-11 HISTORY — DX: Syncope and collapse: R55

## 2014-09-11 HISTORY — DX: Synovial cyst of popliteal space (Baker), unspecified knee: M71.20

## 2014-09-11 HISTORY — DX: Cellulitis of right lower limb: L03.115

## 2014-09-11 HISTORY — DX: Unspecified adrenocortical insufficiency: E27.40

## 2014-09-11 LAB — URINALYSIS, ROUTINE W REFLEX MICROSCOPIC
Bilirubin Urine: NEGATIVE
Glucose, UA: NEGATIVE mg/dL
Hgb urine dipstick: NEGATIVE
Ketones, ur: NEGATIVE mg/dL
Nitrite: POSITIVE — AB
Protein, ur: NEGATIVE mg/dL
Specific Gravity, Urine: 1.01 (ref 1.005–1.030)
Urobilinogen, UA: 0.2 mg/dL (ref 0.0–1.0)
pH: 7 (ref 5.0–8.0)

## 2014-09-11 LAB — CBC WITH DIFFERENTIAL/PLATELET
Basophils Absolute: 0.1 10*3/uL (ref 0.0–0.1)
Basophils Relative: 0 % (ref 0–1)
Eosinophils Absolute: 1 10*3/uL — ABNORMAL HIGH (ref 0.0–0.7)
Eosinophils Relative: 5 % (ref 0–5)
HCT: 41.6 % (ref 36.0–46.0)
Hemoglobin: 13.7 g/dL (ref 12.0–15.0)
Lymphocytes Relative: 18 % (ref 12–46)
Lymphs Abs: 3.6 10*3/uL (ref 0.7–4.0)
MCH: 30.4 pg (ref 26.0–34.0)
MCHC: 32.9 g/dL (ref 30.0–36.0)
MCV: 92.2 fL (ref 78.0–100.0)
Monocytes Absolute: 0.8 10*3/uL (ref 0.1–1.0)
Monocytes Relative: 4 % (ref 3–12)
Neutro Abs: 14.4 10*3/uL — ABNORMAL HIGH (ref 1.7–7.7)
Neutrophils Relative %: 73 % (ref 43–77)
Platelets: 234 10*3/uL (ref 150–400)
RBC: 4.51 MIL/uL (ref 3.87–5.11)
RDW: 13.8 % (ref 11.5–15.5)
WBC: 19.8 10*3/uL — ABNORMAL HIGH (ref 4.0–10.5)

## 2014-09-11 LAB — BASIC METABOLIC PANEL
Anion gap: 15 (ref 5–15)
BUN: 9 mg/dL (ref 6–23)
CO2: 30 mEq/L (ref 19–32)
Calcium: 8.7 mg/dL (ref 8.4–10.5)
Chloride: 97 mEq/L (ref 96–112)
Creatinine, Ser: 0.73 mg/dL (ref 0.50–1.10)
GFR calc Af Amer: >90 mL/min (ref >90)
GFR calc non Af Amer: 79 mL/min — ABNORMAL LOW (ref >90)
Glucose, Bld: 88 mg/dL (ref 70–99)
Potassium: 3.2 mEq/L — ABNORMAL LOW (ref 3.7–5.3)
Sodium: 142 mEq/L (ref 137–147)

## 2014-09-11 LAB — TROPONIN I
Troponin I: <0.3 ng/mL (ref <0.30)
Troponin I: <0.3 ng/mL (ref <0.30)

## 2014-09-11 LAB — CBG MONITORING, ED: Glucose-Capillary: 81 mg/dL (ref 70–99)

## 2014-09-11 LAB — LACTIC ACID, PLASMA: Lactic Acid, Venous: 2.7 mmol/L — ABNORMAL HIGH (ref 0.5–2.2)

## 2014-09-11 LAB — URINE MICROSCOPIC-ADD ON

## 2014-09-11 LAB — GLUCOSE, CAPILLARY
Glucose-Capillary: 103 mg/dL — ABNORMAL HIGH (ref 70–99)
Glucose-Capillary: 92 mg/dL (ref 70–99)

## 2014-09-11 LAB — MAGNESIUM: MAGNESIUM: 2 mg/dL (ref 1.5–2.5)

## 2014-09-11 MED ORDER — ONDANSETRON HCL 4 MG PO TABS: 4.0000 mg | ORAL_TABLET | Freq: Four times a day (QID) | ORAL | Status: DC | PRN

## 2014-09-11 MED ORDER — POTASSIUM CHLORIDE CRYS ER 10 MEQ PO TBCR: 10.0000 meq | EXTENDED_RELEASE_TABLET | Freq: Every day | ORAL | Status: DC

## 2014-09-11 MED ORDER — LEVOTHYROXINE SODIUM 75 MCG PO TABS
75.0000 ug | ORAL_TABLET | Freq: Every day | ORAL | Status: DC
  Administered 2014-09-12 – 2014-09-15 (×4): 75 ug via ORAL
  Filled 2014-09-11 (×4): qty 1

## 2014-09-11 MED ORDER — POTASSIUM CHLORIDE IN NACL 20-0.9 MEQ/L-% IV SOLN
INTRAVENOUS | Status: DC
  Administered 2014-09-11 – 2014-09-12 (×2): via INTRAVENOUS

## 2014-09-11 MED ORDER — GABAPENTIN 300 MG PO CAPS
300.0000 mg | ORAL_CAPSULE | Freq: Two times a day (BID) | ORAL | Status: DC
  Administered 2014-09-11 – 2014-09-15 (×9): 300 mg via ORAL
  Filled 2014-09-11 (×9): qty 1

## 2014-09-11 MED ORDER — SENNA 8.6 MG PO TABS: 1.0000 | ORAL_TABLET | Freq: Two times a day (BID) | ORAL | Status: DC

## 2014-09-11 MED ORDER — SODIUM CHLORIDE 0.9 % IJ SOLN
3.0000 mL | Freq: Two times a day (BID) | INTRAMUSCULAR | Status: DC
  Administered 2014-09-12 – 2014-09-15 (×6): 3 mL via INTRAVENOUS

## 2014-09-11 MED ORDER — ONDANSETRON HCL 4 MG/2ML IJ SOLN: 4.0000 mg | Freq: Four times a day (QID) | INTRAMUSCULAR | Status: DC | PRN

## 2014-09-11 MED ORDER — ASPIRIN EC 81 MG PO TBEC
81.0000 mg | DELAYED_RELEASE_TABLET | Freq: Every day | ORAL | Status: DC
  Administered 2014-09-11 – 2014-09-15 (×5): 81 mg via ORAL
  Filled 2014-09-11 (×5): qty 1

## 2014-09-11 MED ORDER — ENOXAPARIN SODIUM 40 MG/0.4ML ~~LOC~~ SOLN
40.0000 mg | SUBCUTANEOUS | Status: DC
  Administered 2014-09-11: 40 mg via SUBCUTANEOUS
  Filled 2014-09-11: qty 0.4

## 2014-09-11 MED ORDER — VANCOMYCIN HCL 10 G IV SOLR
2000.0000 mg | Freq: Once | INTRAVENOUS | Status: AC
  Administered 2014-09-11: 2000 mg via INTRAVENOUS
  Filled 2014-09-11: qty 2000

## 2014-09-11 MED ORDER — ACETAMINOPHEN 500 MG PO TABS
1000.0000 mg | ORAL_TABLET | Freq: Once | ORAL | Status: AC
  Administered 2014-09-11: 1000 mg via ORAL
  Filled 2014-09-11: qty 2

## 2014-09-11 MED ORDER — ACETAMINOPHEN 500 MG PO TABS
1000.0000 mg | ORAL_TABLET | Freq: Three times a day (TID) | ORAL | Status: DC | PRN
  Administered 2014-09-11 – 2014-09-14 (×6): 1000 mg via ORAL
  Filled 2014-09-11 (×6): qty 2

## 2014-09-11 MED ORDER — NYSTATIN 100000 UNIT/GM EX POWD
Freq: Three times a day (TID) | CUTANEOUS | Status: DC
  Administered 2014-09-11 – 2014-09-15 (×12): via TOPICAL
  Filled 2014-09-11: qty 15

## 2014-09-11 MED ORDER — SIMVASTATIN 20 MG PO TABS
20.0000 mg | ORAL_TABLET | Freq: Every day | ORAL | Status: DC
  Administered 2014-09-11 – 2014-09-14 (×4): 20 mg via ORAL
  Filled 2014-09-11 (×4): qty 1

## 2014-09-11 MED ORDER — POTASSIUM CHLORIDE CRYS ER 20 MEQ PO TBCR
40.0000 meq | EXTENDED_RELEASE_TABLET | Freq: Once | ORAL | Status: AC
  Administered 2014-09-11: 40 meq via ORAL
  Filled 2014-09-11: qty 2

## 2014-09-11 MED ORDER — DEXTROSE 5 % IV SOLN
1.0000 g | Freq: Once | INTRAVENOUS | Status: AC
  Administered 2014-09-11: 1 g via INTRAVENOUS
  Filled 2014-09-11: qty 10

## 2014-09-11 MED ORDER — HEPARIN SODIUM (PORCINE) 5000 UNIT/ML IJ SOLN
5000.0000 [IU] | Freq: Three times a day (TID) | INTRAMUSCULAR | Status: DC
  Administered 2014-09-11 – 2014-09-15 (×12): 5000 [IU] via SUBCUTANEOUS
  Filled 2014-09-11 (×11): qty 1

## 2014-09-11 MED ORDER — PREDNISONE 10 MG PO TABS
10.0000 mg | ORAL_TABLET | Freq: Every day | ORAL | Status: DC
  Administered 2014-09-12 – 2014-09-15 (×4): 10 mg via ORAL
  Filled 2014-09-11 (×4): qty 1

## 2014-09-11 MED ORDER — PREDNISONE 10 MG PO TABS: 10.0000 mg | ORAL_TABLET | Freq: Every day | ORAL | Status: DC

## 2014-09-11 MED ORDER — BISACODYL 10 MG RE SUPP: 10.0000 mg | Freq: Every day | RECTAL | Status: DC | PRN

## 2014-09-11 MED ORDER — PREDNISONE 10 MG PO TABS
10.0000 mg | ORAL_TABLET | Freq: Every day | ORAL | Status: DC
  Filled 2014-09-11: qty 1

## 2014-09-11 MED ORDER — ALUM & MAG HYDROXIDE-SIMETH 200-200-20 MG/5ML PO SUSP: 30.0000 mL | Freq: Four times a day (QID) | ORAL | Status: DC | PRN

## 2014-09-11 MED ORDER — PANTOPRAZOLE SODIUM 40 MG PO TBEC
40.0000 mg | DELAYED_RELEASE_TABLET | Freq: Every day | ORAL | Status: DC
  Administered 2014-09-11 – 2014-09-15 (×5): 40 mg via ORAL
  Filled 2014-09-11 (×5): qty 1

## 2014-09-11 MED ORDER — TRAZODONE HCL 50 MG PO TABS: 25.0000 mg | ORAL_TABLET | Freq: Every evening | ORAL | Status: DC | PRN

## 2014-09-11 MED ORDER — PREDNISONE 20 MG PO TABS
20.0000 mg | ORAL_TABLET | Freq: Two times a day (BID) | ORAL | Status: DC
  Administered 2014-09-11: 20 mg via ORAL
  Filled 2014-09-11: qty 1

## 2014-09-11 MED ORDER — CEFTRIAXONE SODIUM IN DEXTROSE 20 MG/ML IV SOLN
1.0000 g | INTRAVENOUS | Status: DC
Start: 2014-09-12 — End: 2014-09-15
  Administered 2014-09-12 – 2014-09-14 (×3): 1 g via INTRAVENOUS
  Filled 2014-09-11 (×4): qty 50

## 2014-09-11 MED ORDER — POTASSIUM CHLORIDE CRYS ER 20 MEQ PO TBCR
20.0000 meq | EXTENDED_RELEASE_TABLET | Freq: Two times a day (BID) | ORAL | Status: DC
  Administered 2014-09-11 – 2014-09-13 (×4): 20 meq via ORAL
  Filled 2014-09-11 (×4): qty 1

## 2014-09-11 MED ORDER — POLYETHYLENE GLYCOL 3350 17 G PO PACK
17.0000 g | PACK | Freq: Every day | ORAL | Status: DC
  Administered 2014-09-11 – 2014-09-14 (×4): 17 g via ORAL
  Filled 2014-09-11 (×5): qty 1

## 2014-09-11 MED ORDER — SENNOSIDES-DOCUSATE SODIUM 8.6-50 MG PO TABS
1.0000 | ORAL_TABLET | Freq: Two times a day (BID) | ORAL | Status: DC
  Administered 2014-09-11 – 2014-09-15 (×8): 1 via ORAL
  Filled 2014-09-11 (×8): qty 1

## 2014-09-11 MED ORDER — HYDROCORTISONE NA SUCCINATE PF 100 MG IJ SOLR
75.0000 mg | Freq: Once | INTRAMUSCULAR | Status: AC
  Administered 2014-09-11: 75 mg via INTRAVENOUS
  Filled 2014-09-11: qty 2

## 2014-09-11 MED ORDER — SODIUM CHLORIDE 0.9 % IV SOLN: INTRAVENOUS | Status: DC

## 2014-09-11 MED ORDER — VANCOMYCIN HCL IN DEXTROSE 1-5 GM/200ML-% IV SOLN
1000.0000 mg | Freq: Two times a day (BID) | INTRAVENOUS | Status: DC
  Administered 2014-09-11 – 2014-09-13 (×5): 1000 mg via INTRAVENOUS
  Filled 2014-09-11 (×6): qty 200

## 2014-09-11 NOTE — Telephone Encounter (Signed)
Dr Leonie Man see previous note.

## 2014-09-11 NOTE — ED Notes (Signed)
Family reports patient appears "not as quick to respond" and more confused than normal. Family reports nurse's aide expressed same concern to them upon sending pt to ED. RN informed family pt was alert and oriented upon ED arrival and c/o leg pain. EMS also reported leg pain/swelling/redness/bruising x 1 week. Neuro exam wnl and CBG 81.

## 2014-09-11 NOTE — ED Provider Notes (Signed)
CSN: [id number]     Arrival date & time 09/11/14  8657 History  This chart was scribed for Virgel Manifold, MD by Dellis Filbert, ED Scribe. The patient was seen in APA07/[facility]07 and the patient's care was started at 9:58 AM. Chief Complaint  Patient presents with  . Leg Pain   Patient is a 80 y.o. female presenting with leg pain. The history is provided by the patient and a relative. No language interpreter was used.  Leg Pain Associated symptoms: fatigue   Associated symptoms: no back pain     HPI Comments: Kristin Logan is a 80 y.o. female with a history of breast cancer, and A-Fib who presents to the Emergency Department complaining of RLE pain onset 4 days ago. Monday pt fell and landed on her arm and her daughter took her to the doctor.She was given an increased dosage of lasix which has provided no relief. Since the onset her leg has gradually worsened. Pt's daughter also states she has appeared confused and is fatigued. She does have urine incontinence. Pt did have nausea this morning but is fine now. Pt is not hurting anywhere else. Pt had a stroke last November. She denies SOB, CP back pain, diaphoresis and fever.   Past Medical History  Diagnosis Date  . Diabetes mellitus   . Hyperlipidemia   . Eosinophilia   . Cataract   . Hypertension   . Atrial fibrillation prior to 2008    moderate biatrial enlargement in 2009; mild to moderate LVH with a low-normal EF  . GERD (gastroesophageal reflux disease)     Hemorrhoids; history of peptic ulcer disease  . DJD (degenerative joint disease) of knee   . Chronic anticoagulation   . Aortic valve disease     2009: mild stenosis and regurgitation; peak gradient of 30-35 mmHg , subsequent [facility]R [facility]  . Incontinence     Mild  . Hip fracture, left 12/12/2012  . Foot injury 06/25/2012  . Degenerative joint disease of knee 01/29/2008    Bilateral    . BREAST CANCER, HX OF 01/29/2008    Lumpectomy, radiation therapy  Arimidex stopped in July  2013    . Syncope and collapse 11/11/2012  . Diverticulosis   . Umbilical hernia   . Cholelithiasis   . DDD (degenerative disc disease), lumbar   . Vertebral compression fracture     lumbar  . Chronic pain   . Paresthesias     feet  . Gastroparesis   . Sleep apnea     CPAP  . Carcinoma of breast     Right mastectomy; radiation and hormonal therapy  . Stroke 2014    Hemorrhagic stroke    Past Surgical History  Procedure Laterality Date  . Exploration post operative open heart    . Colonoscopy      Approximately 2000  . Cardiac valve replacement      [facility]  . Cardiac valve replacement    . Breast surgery    . Breast lumpectomy    . Esophagogastroduodenoscopy N/A 02/20/2013    Procedure: ESOPHAGOGASTRODUODENOSCOPY (EGD);  Surgeon: Rogene Houston, MD;  Location: [facility] ENDO [facility];  Service: Endoscopy;  Laterality: N/A;  . Mastectomy     Family History  Problem Relation Age of Onset  . Heart disease Mother   . Stroke Father   . Cancer Sister   . Heart disease Sister   . Stroke Sister   . Stroke Brother    History  Substance  Use Topics  . Smoking status: Never Smoker   . Smokeless tobacco: Never Used  . Alcohol Use: No   OB History    No data available     Review of Systems  Constitutional: Positive for fatigue. Negative for chills.  Respiratory: Negative for shortness of breath.   Cardiovascular: Positive for leg swelling. Negative for chest pain.  Musculoskeletal: Negative for myalgias and back pain.  Psychiatric/Behavioral: Positive for confusion.  All other systems reviewed and are negative.   Allergies  Coumadin; Nsaids; Codeine; and Adhesive  Home Medications   Prior to Admission medications   Medication Sig Start Date End Date Taking? Authorizing Provider  acetaminophen (TYLENOL) 650 MG CR tablet Take 1,300 mg by mouth every 8 (eight) hours as needed for pain. 01/21/13  Yes Samuella Cota, MD  aspirin EC 81 MG tablet Take 1 tablet (81 mg total) by  mouth daily. 09/01/13  Yes Abelina Bachelor, PA-C  clotrimazole-betamethasone (LOTRISONE) cream Apply 1 application topically 2 (two) times daily. 10/18/13  Yes Alycia Rossetti, MD  diclofenac sodium (VOLTAREN) 1 % GEL Apply 2 g topically 4 (four) times daily as needed (arthritis pain to knees).   Yes Historical Provider, MD  esomeprazole (NEXIUM) 20 MG capsule Take 20 mg by mouth daily at 12 noon.   Yes Historical Provider, MD  furosemide (LASIX) 40 MG tablet Take 40 mg by mouth daily.  06/09/14  Yes Alycia Rossetti, MD  gabapentin (NEURONTIN) 300 MG capsule Take 1 capsule (300 mg total) by mouth 2 (two) times daily. 03/11/14  Yes Alycia Rossetti, MD  levothyroxine (SYNTHROID, LEVOTHROID) 75 MCG tablet Take 75 mcg by mouth daily before breakfast. 08/25/13  Yes Radene Gunning, NP  nystatin (MYCOSTATIN/NYSTOP) 100000 UNIT/GM POWD Apply to affected areas three times a day 12/09/13  Yes Alycia Rossetti, MD  omeprazole (PRILOSEC) 20 MG capsule TAKE (1) CAPSULE BY MOUTH TWICE DAILY FOR HEARTBURN. 09/04/14  Yes Alycia Rossetti, MD  polyethylene glycol powder (GLYCOLAX/MIRALAX) powder Take 17 g by mouth daily. 06/09/14  Yes Alycia Rossetti, MD  potassium chloride (K-DUR) 10 MEQ tablet TAKE 1 TABLET BY MOUTH ONCE DAILY. 08/18/14  Yes Alycia Rossetti, MD  predniSONE (DELTASONE) 5 MG tablet Take 10 mg by mouth daily with breakfast. 10/01/13  Yes Alycia Rossetti, MD  simvastatin (ZOCOR) 20 MG tablet Take 1 tablet (20 mg total) by mouth at bedtime. 01/28/13  Yes Alycia Rossetti, MD   BP 152/63 mmHg  Pulse 94  Temp(Src) 98.2 F (36.8 C) (Oral)  Resp 18  Ht 5\' 5"  (1.651 m)  Wt 250 lb (113.399 kg)  BMI 41.60 kg/m2  SpO2 95% Physical Exam  Constitutional: She is oriented to person, place, and time. She appears well-developed and well-nourished. No distress.  Tired appearing.   HENT:  Head: Normocephalic and atraumatic.  Eyes: Conjunctivae are normal. Right eye exhibits no discharge. Left eye exhibits no  discharge.  Neck: Neck supple.  Cardiovascular: Normal rate and normal heart sounds.  An irregular rhythm present. Exam reveals no gallop and no friction rub.   No murmur heard. Mildly tachycardic  Pulmonary/Chest: Effort normal and breath sounds normal. No respiratory distress.  Abdominal: Soft. She exhibits no distension. There is no tenderness.  Musculoskeletal: She exhibits no edema or tenderness.  Scattered ecchymosis to b/l UE   Neurological: She is alert and oriented to person, place, and time. She has normal reflexes. She displays normal reflexes. No cranial nerve deficit.  She exhibits normal muscle tone. Coordination normal.  Speech clear. Unable to give me exact date but correct month. Follows commands. Good finger to nose b/l.   Skin: Skin is warm and dry.  Violaceous skin changes to RLE. Circumferential and pretty sharply demarcated along distal aspect. Fainter erythema extending a few cm in area of medial malleolus of ankle. Warm to touch. Tender. Can actively range R ankle. Pitting edema b/l. LLE with more chronic appearing skin changes. Healing ulceration to medial/posterior L lower leg which does not appear infected.   Psychiatric: She has a normal mood and affect. Her behavior is normal. Thought content normal.  Vitals reviewed.   ED Course  Procedures  DIAGNOSTIC STUDIES: Oxygen Saturation is 95% on room air, normal by my interpretation.    COORDINATION OF CARE: 10:06 AM Discussed treatment plan with pt at bedside and pt agreed to plan.  Labs Review Labs Reviewed - No data to display  Imaging Review No results found.   EKG Interpretation   Date/Time:  Friday September 11 2014 09:29:30 EST Ventricular Rate:  101 PR Interval:    QRS Duration: 95 QT Interval:  334 QTC Calculation: 433 R Axis:   40 Text Interpretation:  Atrial fibrillation Probable anteroseptal infarct,  old diffuse st depression Confirmed by [place]  MD, Stiles ([id number]) on  09/11/2014 9:40:00  AM      MDM   Final diagnoses:  Pain  Cellulitis of right lower extremity  Sepsis, due to unspecified organism  UTI (lower urinary tract infection)   82yF with generalized weakness and RLE pain. Lives alone. Frequent falls, but denies acute trauma which she can clearly attribute RLE pain/skin changes to. Suspect erysipelas/cellulitis. Will [place] to eval for DVT though I feel less likely. Family reports mental status changes which describe as "being slower to respond." I get the sense that this is more generalized weakness/fatigue. Prior hx of hemorrhagic stroke. My neuro exam is nonfocal though. She does appear tired to me but responds appropriately to questions and follows multi-step commands. EKG showing afib which she has a history of. Only on 81 mg ASA because of prior hemorrhagic stroke. EKG also showing diffuse ischemic changes which are new from prior. hx. She denies CP or SOB. "Weakness" alone plausible for ACS, albeit atypical. Will check rectal temp in addition to UA and CXR. Suspect will ultimately admit.   Dose have temp of 103.1 rectally. Gives more support to infectious etiology of RLE findings. Skin infections typically without broader systemic symptoms/fever though. Will obtain blood cultures. Vancomycin per pharmacy but historically renal function appears to be good. Mild RVR, but clinically not in HF. Rate controlling meds deferred. Expect that will improve to some degree with antipyretics.   Notified by nursing that pt had brief episode of unresponsiveness while transferring from bedside toilet. Suspect she had syncopal episode. Related to micturition? Orthostasis when transferring?  When I re-evaluated her she was sitting up in bed. Awake and responsive. No additional complaints. Exam unchanged. Remains HD stable.   UA consistent with UTI. Culture. Rocephin.   I personally preformed the services scribed in my presence. The recorded information has been reviewed is accurate.  Virgel Manifold, MD.     Virgel Manifold, MD 09/11/14 [id number]

## 2014-09-11 NOTE — Telephone Encounter (Signed)
Patients daughter in law angie calling to speak with dr Buelah Manis about abbygayle being admitted to the hospital, would like to go over what is going on with her please call her at [phone]

## 2014-09-11 NOTE — Progress Notes (Signed)
Nutrition Brief Note  Patient identified on the Malnutrition Screening Tool (MST) Report  Pt presents with right leg pain. Hx of recurrent falls. Possible SNF at discharge?  Wt Readings from Last 15 Encounters:  09/11/14 220 lb 3.8 oz (99.9 kg)  09/07/14 223 lb (101.152 kg)  08/10/14 225 lb (102.059 kg)  08/05/14 250 lb (113.399 kg)  07/26/14 200 lb (90.719 kg)  06/09/14 221 lb (100.245 kg)  05/28/14 218 lb (98.884 kg)  03/11/14 203 lb (92.08 kg)  02/19/14 204 lb (92.534 kg)  02/18/14 204 lb (92.534 kg)  12/30/13 195 lb (88.451 kg)  12/22/13 197 lb (89.359 kg)  12/12/13 196 lb (88.905 kg)  12/09/13 197 lb (89.359 kg)  11/06/13 194 lb (87.998 kg)    Body mass index is 36.65 kg/(m^2). Patient meets criteria for obesity class II based on current BMI. Pt weight hx supports weight gain trend. Suspect wt of 250# to be an outlier.  Current diet order is Heart Healthy/CHO Modified. Labs and medications reviewed.   No nutrition interventions warranted at this time. If nutrition issues arise, please consult RD.   Colman Cater MS,RD,CSG,LDN Office: [phone] Pager: [phone]

## 2014-09-11 NOTE — ED Notes (Signed)
Upon returning to bed from bsc pt c/o "not feeling good", dizziness, nausea and sweating. VS and EKG obtained. Dyanne Carrel NP notified and at bedside. Pt alert and oriented x 4 .

## 2014-09-11 NOTE — ED Notes (Signed)
Pt brought in EMS for RLE pain. Per EMS, pt and pt caregiver poor historian. No obvious deformity noted. Large bruise noted to Right Lower Calf. Mild warmth noted,pulses equal. Moderate edema noted to BLE.

## 2014-09-11 NOTE — Progress Notes (Signed)
Pharmacy Note:  Initial antibiotics for Vancomycin ordered by EDP for cellulitis  Estimated Creatinine Clearance: 70.5 mL/min (by C-G formula based on Cr of 0.66).  Allergies  Allergen Reactions  . Coumadin [Warfarin Sodium] Other (See Comments)    Patient had ICH  . Nsaids Other (See Comments)    Bleeding ulcer  . Codeine Nausea And Vomiting  . Adhesive [Tape] Rash    Redness, and peeling skin off.    Filed Vitals:   09/11/14 1026  BP:   Pulse:   Temp: 103.1 F (39.5 C)  Resp:     Anti-infectives    Start     Dose/Rate Route Frequency Ordered Stop   09/11/14 1100  vancomycin (VANCOCIN) 2,000 mg in sodium chloride 0.9 % 500 mL IVPB     2,000 mg250 mL/hr over 120 Minutes Intravenous  Once 09/11/14 1037       Plan: Initial dose of Vancomycin 2gm X 1 ordered. F/U admission orders for further dosing if therapy continued.  Pricilla Larsson, [facility] 09/11/2014 10:38 AM

## 2014-09-11 NOTE — Telephone Encounter (Signed)
Pt is being admitted to [facility] today, she may have a blockage, just wanted Dr. Leonie Man to be aware.  If you have any questions or concerns please call.

## 2014-09-11 NOTE — Progress Notes (Addendum)
ANTIBIOTIC CONSULT NOTE - FOLLOW UP  Pharmacy Consult for Vancomycin Indication: cellulitis  Allergies  Allergen Reactions  . Coumadin [Warfarin Sodium] Other (See Comments)    Patient had ICH  . Nsaids Other (See Comments)    Bleeding ulcer  . Codeine Nausea And Vomiting  . Adhesive [Tape] Rash    Redness, and peeling skin off.    Patient Measurements: Height: 5\' 5"  (165.1 cm) Weight: 250 lb (113.399 kg) IBW/kg (Calculated) : 57  Vital Signs: Temp: 98.2 F (36.8 C) (12/11 1058) Temp Source: Rectal (12/11 1026) BP: 123/70 mmHg (12/11 1346) Pulse Rate: 76 (12/11 1346) Intake/Output from previous day:   Intake/Output from this shift:    Labs:  Recent Labs  09/11/14 1016  WBC 19.8*  HGB 13.7  PLT 234  CREATININE 0.73   Estimated Creatinine Clearance: 70.5 mL/min (by C-G formula based on Cr of 0.73). No results for input(s): VANCOTROUGH, VANCOPEAK, VANCORANDOM, GENTTROUGH, GENTPEAK, GENTRANDOM, TOBRATROUGH, TOBRAPEAK, TOBRARND, AMIKACINPEAK, AMIKACINTROU, AMIKACIN in the last 72 hours.   Microbiology: Recent Results (from the past 720 hour(s))  Blood culture (routine x 2)     Status: None (Preliminary result)   Collection Time: 09/11/14 11:19 AM  Result Value Ref Range Status   Specimen Description BLOOD RIGHT HAND  Final   Special Requests BOTTLES DRAWN AEROBIC ONLY 6CC  Final   Culture PENDING  Incomplete   Report Status PENDING  Incomplete  Blood culture (routine x 2)     Status: None (Preliminary result)   Collection Time: 09/11/14 11:24 AM  Result Value Ref Range Status   Specimen Description RIGHT ANTECUBITAL  Final   Special Requests BOTTLES DRAWN AEROBIC AND ANAEROBIC [facility] EACH  Final   Culture PENDING  Incomplete   Report Status PENDING  Incomplete    Anti-infectives    Start     Dose/Rate Route Frequency Ordered Stop   09/11/14 1115  cefTRIAXone (ROCEPHIN) 1 g in dextrose 5 % 50 mL IVPB     1 g100 mL/hr over 30 Minutes Intravenous  Once 09/11/14  1112 09/11/14 1411   09/11/14 1100  vancomycin (VANCOCIN) 2,000 mg in sodium chloride 0.9 % 500 mL IVPB     2,000 mg250 mL/hr over 120 Minutes Intravenous  Once 09/11/14 1037 09/11/14 1331     Assessment: 80 yo female w/ lower extremity cellulitis, urinary tract infection, fever and syncope.  Obesity/Normalized CrCl dosing protocol will be initiate with an estimated normalized CrCl = 69 ml/min.   Goal of Therapy:  Vancomycin trough level 10-15 mcg/ml  Plan:  Vancomycin 2gm IV x 1 (given in ED). Vancomycin 1gm IV every 12 hours. Measure antibiotic drug levels at steady state Follow up culture results  Pricilla Larsson 09/11/2014,2:19 PM  Pharmacy consult for Ceftriaxone for cellulitis added on admission Ceftriaxone 1 GM IV given in ED Start Ceftriaxone 1 GM IV every 24 hours, next dose 09/15/2014 at 1 PM Labs per protocol [facility] 09/14/2014 3 PM

## 2014-09-11 NOTE — ED Notes (Signed)
MD at bedside. 

## 2014-09-11 NOTE — Telephone Encounter (Signed)
Spoke with pt daughter, admitted fever, sepsis ? UTI or possible cellulitis of leg, fall again today Will go to SNF

## 2014-09-11 NOTE — ED Notes (Signed)
Upon attempting to transfer to bed from bcs after attempting to have bowel movement, pt became unresponsive  for 20-30 seconds. VS obtained and MD notified and at bedside.cbg 103. Pt returned to baseline and neuro exam wnl.

## 2014-09-11 NOTE — H&P (Signed)
[name] History and Physical  WILLO YOON BPZ:[id number] DOB: [birth year] DOA: 09/11/2014  Referring physician:  PCP: Vic Blackbird, MD   Chief Complaint: right leg pain  HPI: Kristin Logan is a 80 y.o. female with a past medical history of A. fib no longer on Coumadin due to frequent falls, hemorrhagic stroke, adrenal insufficiency on chronic prednisone, hypothyroidism, breast cancer, diabetes, sleep apnea presents to the emergency department with the chief complaint of right leg pain and swelling. She'll evaluation in the emergency department reveals lower extremity cellulitis, urinary tract infection, fever and syncope. Patient reports that she developed sudden pain in her right lower extremity this morning upon awakening. Pain was mostly in the right ankle up to mid shin. Reports difficulty bearing weight. Associated symptoms include swelling erythema that started 4 days ago. He reports a fall at that time. She saw her PCP for left arm pain. At that time she was given Lasix for lower extremity edema. She reports no improvement. This morning she was visited by home health RN and noted to be somewhat confused. She denies fever chills abdominal pain nausea vomiting diarrhea constipation. She denies dysuria hematuria frequency or urgency. She denies chest pain palpitations shortness of breath headache dizziness. While in the emergency department she was assisted to the bedside commode. While attempting to have a bowel movement nursing staff reports a syncopal episode that lasted 40 seconds. Later in the afternoon she got up to the bedside commode with assistance she urinated and upon getting back to bed had a second syncopal episode. Heart rate dropped to 70 at that point. He became diaphoretic and quickly regained awareness.  Workup in the emergency department includes basic metabolic panel significant for potassium of 3.2, complete blood count significant for leukocytosis of  19.8, analysis with many bacteria 3-6 WBCs positive nitrite trace leukocytes, lactic acid 2.7, chest x-ray with no acute cardiopulmonary process, EKG with A. fib and diffuse ST depression, initial troponin negative, venous Doppler of right lower leg negative for DVT possible Baker's cyst.   In the emergency department she has a rectal temp of 103 blood pressure of 101/58 heart rate of 101. He is not hypoxic. She is nontoxic appearing. She is provided with vancomycin and Rocephin, Tylenol and IV fluids.  Review of Systems:  10 point review of systems completed and all systems are negative except as indicated in the history of present illness Past Medical History  Diagnosis Date  . Diabetes mellitus   . Hyperlipidemia   . Eosinophilia   . Cataract   . Hypertension   . Atrial fibrillation prior to 2008    moderate biatrial enlargement in 2009; mild to moderate LVH with a low-normal EF  . GERD (gastroesophageal reflux disease)     Hemorrhoids; history of peptic ulcer disease  . DJD (degenerative joint disease) of knee   . Chronic anticoagulation   . Aortic valve disease     2009: mild stenosis and regurgitation; peak gradient of 30-35 mmHg , subsequent AVR at [facility]  . Incontinence     Mild  . Hip fracture, left 12/12/2012  . Foot injury 06/25/2012  . Degenerative joint disease of knee 01/29/2008    Bilateral    . BREAST CANCER, HX OF 01/29/2008    Lumpectomy, radiation therapy  Arimidex stopped in July 2013    . Syncope and collapse 11/11/2012  . Diverticulosis   . Umbilical hernia   . Cholelithiasis   . DDD (degenerative disc disease), lumbar   .  Vertebral compression fracture     lumbar  . Chronic pain   . Paresthesias     feet  . Gastroparesis   . Sleep apnea     CPAP  . Carcinoma of breast     Right mastectomy; radiation and hormonal therapy  . Stroke 2014    Hemorrhagic stroke    Past Surgical History  Procedure Laterality Date  . Exploration post operative open heart      . Colonoscopy      Approximately 2000  . Cardiac valve replacement      [facility]  . Cardiac valve replacement    . Breast surgery    . Breast lumpectomy    . Esophagogastroduodenoscopy N/A 02/20/2013    Procedure: ESOPHAGOGASTRODUODENOSCOPY (EGD);  Surgeon: Rogene Houston, MD;  Location: [facility];  Service: Endoscopy;  Laterality: N/A;  . Mastectomy     Social History:  reports that she has never smoked. She has never used smokeless tobacco. She reports that she does not drink alcohol or use illicit drugs. She lives alone in an apartment. She has a home health aide in the morning and in the afternoon. She has suffered frequent falls during that time she's been alone. PCP note indicates she needs 24 hour care Allergies  Allergen Reactions  . Coumadin [Warfarin Sodium] Other (See Comments)    Patient had ICH  . Nsaids Other (See Comments)    Bleeding ulcer  . Codeine Nausea And Vomiting  . Adhesive [Tape] Rash    Redness, and peeling skin off.     Family History  Problem Relation Age of Onset  . Heart disease Mother   . Stroke Father   . Cancer Sister   . Heart disease Sister   . Stroke Sister   . Stroke Brother      Prior to Admission medications   Medication Sig Start Date End Date Taking? Authorizing Provider  acetaminophen (TYLENOL) 650 MG CR tablet Take 1,300 mg by mouth every 8 (eight) hours as needed for pain. 01/21/13  Yes Samuella Cota, MD  aspirin EC 81 MG tablet Take 1 tablet (81 mg total) by mouth daily. 09/01/13  Yes Abelina Bachelor, PA-C  clotrimazole-betamethasone (LOTRISONE) cream Apply 1 application topically 2 (two) times daily. 10/18/13  Yes Alycia Rossetti, MD  diclofenac sodium (VOLTAREN) 1 % GEL Apply 2 g topically 4 (four) times daily as needed (arthritis pain to knees).   Yes Historical Provider, MD  esomeprazole (NEXIUM) 20 MG capsule Take 20 mg by mouth daily at 12 noon.   Yes Historical Provider, MD  furosemide (LASIX) 40 MG tablet Take 40 mg by  mouth daily.  06/09/14  Yes Alycia Rossetti, MD  gabapentin (NEURONTIN) 300 MG capsule Take 1 capsule (300 mg total) by mouth 2 (two) times daily. 03/11/14  Yes Alycia Rossetti, MD  levothyroxine (SYNTHROID, LEVOTHROID) 75 MCG tablet Take 75 mcg by mouth daily before breakfast. 08/25/13  Yes Radene Gunning, NP  nystatin (MYCOSTATIN/NYSTOP) 100000 UNIT/GM POWD Apply to affected areas three times a day 12/09/13  Yes Alycia Rossetti, MD  omeprazole (PRILOSEC) 20 MG capsule TAKE (1) CAPSULE BY MOUTH TWICE DAILY FOR HEARTBURN. 09/04/14  Yes Alycia Rossetti, MD  polyethylene glycol powder (GLYCOLAX/MIRALAX) powder Take 17 g by mouth daily. 06/09/14  Yes Alycia Rossetti, MD  potassium chloride (K-DUR) 10 MEQ tablet TAKE 1 TABLET BY MOUTH ONCE DAILY. 08/18/14  Yes Alycia Rossetti, MD  predniSONE ([place])  5 MG tablet Take 10 mg by mouth daily with breakfast. 10/01/13  Yes Alycia Rossetti, MD  simvastatin (ZOCOR) 20 MG tablet Take 1 tablet (20 mg total) by mouth at bedtime. 01/28/13  Yes Alycia Rossetti, MD   Physical Exam: Filed Vitals:   09/11/14 1100 09/11/14 1200 09/11/14 1254 09/11/14 1300  BP: 127/64 102/57 101/58 131/55  Pulse: 100 102  101  Temp:      TempSrc:      Resp: 22 20 20 18   Height:      Weight:      SpO2: 95% 97% 98% 94%    Wt Readings from Last 3 Encounters:  09/11/14 113.399 kg (250 lb)  09/07/14 101.152 kg (223 lb)  08/10/14 102.059 kg (225 lb)    General:  Appears calm and comfortable alert and oriented 2 Eyes: PERRL, normal lids, irises & conjunctiva ENT: grossly normal hearing, mucous membranes of her mouth are pink and moist Neck: no LAD, masses or thyromegaly no JVD Cardiovascular: Irregularly irregular, mildly tachycardic no m/r/g. 1-2+ lower extremity edema bilaterally Respiratory: CTA bilaterally, no w/r/r. Normal respiratory effort.  Abdomen: soft, ntnd obese positive bowel sounds nontender to palpation Skin: Right lower extremity violet color from ankle to  mid shin, some weeping of clear drainage on posterior calf. Borders with erythema. Very tender to touch. Warm to touch Edema as above. Left LE with healing quarter sized ulceration medial aspect mid shin. No drainage. Mild erythema mild tenderness Musculoskeletal: grossly normal tone BUE/BLE Psychiatric: grossly normal mood and affect, speech fluent and appropriate Neurologic: grossly non-focal. Speech clear. Facial symmetry          Labs on Admission:  Basic Metabolic Panel:  Recent Labs Lab 09/11/14 1016  NA 142  K 3.2*  CL 97  CO2 30  GLUCOSE 88  BUN 9  CREATININE 0.73  CALCIUM 8.7   Liver Function Tests: No results for input(s): AST, ALT, ALKPHOS, BILITOT, PROT, ALBUMIN in the last 168 hours. No results for input(s): LIPASE, AMYLASE in the last 168 hours. No results for input(s): AMMONIA in the last 168 hours. CBC:  Recent Labs Lab 09/11/14 1016  WBC 19.8*  NEUTROABS 14.4*  HGB 13.7  HCT 41.6  MCV 92.2  PLT 234   Cardiac Enzymes:  Recent Labs Lab 09/11/14 1016  TROPONINI <0.30    BNP (last 3 results)  Recent Labs  12/22/13 1010  PROBNP 820.0*   CBG:  Recent Labs Lab 09/11/14 0951  GLUCAP 81    Radiological Exams on Admission: Dg Chest 2 View  09/11/2014   CLINICAL DATA:  Right lower extremity pain and swelling. Hypertension.  EXAM: CHEST  2 VIEW  COMPARISON:  December 22, 2013.  FINDINGS: Stable cardiomegaly. Status post coronary artery bypass graft. Hyperexpansion of the lungs is noted suggesting chronic obstructive pulmonary disease. Atherosclerotic calcifications of thoracic aorta are noted. No pneumothorax or pleural effusion is noted. No acute pulmonary disease is noted. Surgical clips are noted in the right axillary region. Bony thorax is intact.  IMPRESSION: No acute cardiopulmonary abnormality seen.   Electronically Signed   By: Sabino Dick M.D.   On: 09/11/2014 11:51   US Venous Img Lower Unilateral Right  09/11/2014   CLINICAL DATA:   80 year old female with right lower extremity pain swelling and bruising for 1 week. Initial encounter. Personal history of breast cancer.  EXAM: RIGHT LOWER EXTREMITY VENOUS DOPPLER ULTRASOUND  TECHNIQUE: Gray-scale sonography with graded compression, as well as color Doppler and  duplex ultrasound were performed to evaluate the lower extremity deep venous systems from the level of the common femoral vein and including the common femoral, femoral, profunda femoral, popliteal and calf veins including the posterior tibial, peroneal and gastrocnemius veins when visible. The superficial great saphenous vein was also interrogated. Spectral Doppler was utilized to evaluate flow at rest and with distal augmentation maneuvers in the common femoral, femoral and popliteal veins.  COMPARISON:  None.  FINDINGS: Contralateral Common Femoral Vein: Respiratory phasicity is normal and symmetric with the symptomatic side. No evidence of thrombus. Normal compressibility.  Common Femoral Vein: No evidence of thrombus. Normal compressibility, respiratory phasicity and response to augmentation.  Saphenofemoral Junction: No evidence of thrombus. Normal compressibility and flow on color Doppler imaging.  Profunda Femoral Vein: No evidence of thrombus. Normal compressibility and flow on color Doppler imaging.  Femoral Vein: No evidence of thrombus. Normal compressibility, respiratory phasicity and response to augmentation.  Popliteal Vein: No evidence of thrombus. Normal compressibility, respiratory phasicity and response to augmentation.  Calf Veins: Limited visualization, no evidence of thrombus. Normal compressibility and flow on color Doppler imaging.  Superficial Great Saphenous Vein: No evidence of thrombus. Normal compressibility and flow on color Doppler imaging.  Venous Reflux:  None.  Other Findings: Subcutaneous edema in the calf. Crescentic fluid collection in the popliteal fossa, favor Baker cyst. This encompasses 3 cm.   IMPRESSION: 1.  No evidence of right lower extremity deep venous thrombosis. 2. Right popliteal fossa Baker cyst suspected.   Electronically Signed   By: Lars Pinks M.D.   On: 09/11/2014 11:21    EKG: Independently reviewed afib with st depression  Assessment/Plan Principal Problem:   Cellulitis: admit to tele. Etiology unclear. She did have a fall 4 days ago but suffered no obvious abrasions.  Continue vancomycin and rocephin. She is febrile with a leukocytosis but appears non-toxic. Obtain blood cultures 2. Tylenol for fever. Gentle IV hydration. Monitor closely   Syncope: May be multifactorial. Infectious process versus vagal response versus orthostatic hypotension. Antibiotics as above. Will check her orthostatic vital signs. Will also monitor on telemetry, gently hydrate without a fluids obtain a 2-D for completeness. Each episode was very brief and included a decrease in heart rate. Initial EKG with ischemic changes. Initial troponin negative. We'll cycle her cardiac enzymes as well. Provide stool softener to avoid straining with bowel movements. Family notes patient with history of constipation when she was on narcotics but does not currently take narcotics.    Abnormal EKG: Initial troponin is negative. We'll cycle cardiac enzymes given her syncopal episodes will also get a 2-D echo.   UTI (lower urinary tract infection): Asymptomatic. Antibiotics as noted above. Will get a urine culture    Leukocytosis: Related to #1 and #3. Antibiotics and Tylenol. Lactic acid 2.7. She remained hemodynamically stable.  A. fib. Currently not on any rate control medications. Will monitor on telemetry. Continue aspirin and on Coumadin due to recent frequent falls    Diabetes mellitus type II, controlled: Diet controlled. Recent A1c 6.2. Will check CBG daily. Provide a car modified diet    Essential hypertension: Only fair control in the emergency department. Home medications include Lasix. Will hold this  for now. On its her closely.    Obesity: BMI 41.7. Nutritional consult for weight loss. Car modified diet    Hypothyroidism: Obtain a TSH. Continue Synthroid    Hypokalemia: Mild. Likely due to diuretics. Will replete and recheck      Peripheral edema:  Chronic. See #1  Frequent falls. Patient has recently moved into her own apartment she lives alone. Chart review indicates mild cognitive impairment. PCP note dated 08/10/2014 indicates she needs 24-hour care and is unwilling to consider this or going to [facility]. She has also refused Aricept in the past as well. I discussed with patient safety concern. Will ask physical therapy to evaluate and make recommendations. Family seems open to [facility] placement. Patient presented like she would consider for rehabilitation.  Hemorrhagic stroke: November 2014 related to hypertension and Coumadin use in setting of A. fib. according to chart. Family reports deficits much improved. However she does continue with frequent falls. Continue her aspirin and her statin.  History of adrenal insufficiency. On daily prednisone. We'll continue this   Code Status: DNR DVT Prophylaxis: Family Communication: son at bedside Disposition Plan: evaluation for placement  Time spent: 60 minutes  [facility] Pager [phone]

## 2014-09-12 ENCOUNTER — Encounter (HOSPITAL_COMMUNITY): Payer: Self-pay | Admitting: Internal Medicine

## 2014-09-12 DIAGNOSIS — I369 Nonrheumatic tricuspid valve disorder, unspecified: Secondary | ICD-10-CM

## 2014-09-12 DIAGNOSIS — E2740 Unspecified adrenocortical insufficiency: Secondary | ICD-10-CM

## 2014-09-12 DIAGNOSIS — M7120 Synovial cyst of popliteal space [Baker], unspecified knee: Secondary | ICD-10-CM | POA: Diagnosis present

## 2014-09-12 HISTORY — DX: Synovial cyst of popliteal space (Baker), unspecified knee: M71.20

## 2014-09-12 LAB — HEPATIC FUNCTION PANEL
ALBUMIN: 2.9 g/dL — AB (ref 3.5–5.2)
ALK PHOS: 62 U/L (ref 39–117)
ALT: 9 U/L (ref 0–35)
AST: 13 U/L (ref 0–37)
Bilirubin, Direct: <0.2 mg/dL (ref 0.0–0.3)
TOTAL PROTEIN: 6.4 g/dL (ref 6.0–8.3)
Total Bilirubin: 0.6 mg/dL (ref 0.3–1.2)

## 2014-09-12 LAB — BASIC METABOLIC PANEL
Anion gap: 14 (ref 5–15)
BUN: 14 mg/dL (ref 6–23)
CALCIUM: 9.1 mg/dL (ref 8.4–10.5)
CO2: 27 mEq/L (ref 19–32)
Chloride: 100 mEq/L (ref 96–112)
Creatinine, Ser: 0.76 mg/dL (ref 0.50–1.10)
GFR calc Af Amer: 90 mL/min — ABNORMAL LOW (ref >90)
GFR, EST NON AFRICAN AMERICAN: 78 mL/min — AB (ref >90)
Glucose, Bld: 161 mg/dL — ABNORMAL HIGH (ref 70–99)
Potassium: 4.6 mEq/L (ref 3.7–5.3)
SODIUM: 141 meq/L (ref 137–147)

## 2014-09-12 LAB — CBC
HCT: 39.1 % (ref 36.0–46.0)
Hemoglobin: 12.5 g/dL (ref 12.0–15.0)
MCH: 29.7 pg (ref 26.0–34.0)
MCHC: 32 g/dL (ref 30.0–36.0)
MCV: 92.9 fL (ref 78.0–100.0)
PLATELETS: 221 10*3/uL (ref 150–400)
RBC: 4.21 MIL/uL (ref 3.87–5.11)
RDW: 14.3 % (ref 11.5–15.5)
WBC: 13.8 10*3/uL — AB (ref 4.0–10.5)

## 2014-09-12 LAB — VITAMIN B12: Vitamin B-12: 705 pg/mL (ref 211–911)

## 2014-09-12 LAB — T4, FREE: FREE T4: 0.92 ng/dL (ref 0.80–1.80)

## 2014-09-12 LAB — TSH: TSH: 0.046 u[IU]/mL — ABNORMAL LOW (ref 0.350–4.500)

## 2014-09-12 NOTE — Progress Notes (Signed)
[name] PROGRESS NOTE  Kristin Logan IDP:[id number] DOB: [birth year] DOA: 09/11/2014 PCP: Vic Blackbird, MD    Code Status: DO NOT RESUSCITATE Family Communication: Discussed with son and daughter-in-law Disposition Plan: Discharge when clinically appropriate; the patient may need skilled nursing facility placement for physical rehabilitation.   Consultants:  None  Procedures:  2-D echocardiogram pending  Antibiotics:  Ceftriaxone 12/11>  Vancomycin 12/11>  HPI/Subjective: The patient says that she feels better. She had a good night sleep. The pain in her right leg is not as bad. No complaints of chest pain or shortness of breath.  Objective: Filed Vitals:   09/12/14 0527  BP: 144/70  Pulse: 71  Temp: 97.7 F (36.5 C)  Resp: 20   oxygen saturation 98% on room air.  Intake/Output Summary (Last 24 hours) at 09/12/14 1009 Last data filed at 09/12/14 0836  Gross per 24 hour  Intake 1443.83 ml  Output   1300 ml  Net 143.83 ml   Filed Weights   09/11/14 1453 09/11/14 1459 09/12/14 0527  Weight: 100.5 kg (221 lb 9 oz) 99.9 kg (220 lb 3.8 oz) 99.927 kg (220 lb 4.8 oz)    Exam:   General:  Pleasant obese 80 year old woman laying in bed, in no acute distress.  Cardiovascular: Irregular, irregular with a soft systolic murmur.  Respiratory: Clear anteriorly with decreased breath sounds in the bases.  Abdomen: Obese, positive bowel sounds, soft, nontender, nondistended.   Musculoskeletal/extremities: Right lower extremity with noticeably less edema at trace to 1+ compared to yesterday. Cellulitic area with a violaceous color from the ankle to the mid shin; warm to touch, mildly tender; some weeping from the posterior calf-nonpurulent. Left lower extremity with a trace of edema and healing quarter size ulceration medial aspect of the leg.  Skin: As above. In addition, she has multiple skin tears on her arms and old ecchymotic changes of her  arms.  Neurologic: She is alert and oriented 3. Cranial nerves II through XII are intact. Handgrip is 5 over 5 symmetrically. She is able to raise each leg against gravity approximately 20-25.   Data Reviewed: Basic Metabolic Panel:  Recent Labs Lab 09/11/14 1015 09/11/14 1016 09/12/14 0619  NA  --  142 141  K  --  3.2* 4.6  CL  --  97 100  CO2  --  30 27  GLUCOSE  --  88 161*  BUN  --  9 14  CREATININE  --  0.73 0.76  CALCIUM  --  8.7 9.1  MG 2.0  --   --    Liver Function Tests:  Recent Labs Lab 09/12/14 0619  AST 13  ALT 9  ALKPHOS 62  BILITOT 0.6  PROT 6.4  ALBUMIN 2.9*   No results for input(s): LIPASE, AMYLASE in the last 168 hours. No results for input(s): AMMONIA in the last 168 hours. CBC:  Recent Labs Lab 09/11/14 1016 09/12/14 0619  WBC 19.8* 13.8*  NEUTROABS 14.4*  --   HGB 13.7 12.5  HCT 41.6 39.1  MCV 92.2 92.9  PLT 234 221   Cardiac Enzymes:  Recent Labs Lab 09/11/14 1016 09/11/14 1540 09/11/14 2219  TROPONINI <0.30 <0.30 <0.30   BNP (last 3 results)  Recent Labs  12/22/13 1010  PROBNP 820.0*   CBG:  Recent Labs Lab 09/11/14 0951 09/11/14 1047 09/11/14 1702  GLUCAP 81 103* 92    Recent Results (from the past 240 hour(s))  Blood culture (routine x 2)  Status: None (Preliminary result)   Collection Time: 09/11/14 11:19 AM  Result Value Ref Range Status   Specimen Description BLOOD RIGHT HAND  Final   Special Requests BOTTLES DRAWN AEROBIC ONLY 6CC  Final   Culture PENDING  Incomplete   Report Status PENDING  Incomplete  Blood culture (routine x 2)     Status: None (Preliminary result)   Collection Time: 09/11/14 11:24 AM  Result Value Ref Range Status   Specimen Description RIGHT ANTECUBITAL  Final   Special Requests BOTTLES DRAWN AEROBIC AND ANAEROBIC [facility] EACH  Final   Culture PENDING  Incomplete   Report Status PENDING  Incomplete     Studies: Dg Chest 2 View  09/11/2014   CLINICAL DATA:  Right lower  extremity pain and swelling. Hypertension.  EXAM: CHEST  2 VIEW  COMPARISON:  December 22, 2013.  FINDINGS: Stable cardiomegaly. Status post coronary artery bypass graft. Hyperexpansion of the lungs is noted suggesting chronic obstructive pulmonary disease. Atherosclerotic calcifications of thoracic aorta are noted. No pneumothorax or pleural effusion is noted. No acute pulmonary disease is noted. Surgical clips are noted in the right axillary region. Bony thorax is intact.  IMPRESSION: No acute cardiopulmonary abnormality seen.   Electronically Signed   By: Sabino Dick M.D.   On: 09/11/2014 11:51   Ct Head Wo Contrast  09/11/2014   CLINICAL DATA:  Syncopal episode after a bowel movement.  EXAM: CT HEAD WITHOUT CONTRAST  TECHNIQUE: Contiguous axial images were obtained from the base of the skull through the vertex without intravenous contrast.  COMPARISON:  Head CT 12/22/2013 and brain MRI 08/27/2013  FINDINGS: Stable age related cerebral atrophy, ventriculomegaly and periventricular white matter disease. No acute intracranial findings such as hemispheric infarction or intracranial hemorrhage. No extra-axial fluid collections. 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 The brainstem and cerebellum are grossly normal and stable.  No significant bony findings. The paranasal sinuses and mastoid air cells are clear. The globes are intact.  IMPRESSION: Stable age related cerebral atrophy, ventriculomegaly and periventricular white matter disease.  No acute intracranial findings.  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   Electronically Signed   By: Kalman Jewels M.D.   On: 09/11/2014 14:27   US Carotid Bilateral  09/11/2014   CLINICAL DATA:  Syncope, hypertension, hyperlipidemia and diabetes.  EXAM: BILATERAL CAROTID DUPLEX ULTRASOUND  TECHNIQUE: Pearline Cables scale imaging, color Doppler and duplex ultrasound were performed of bilateral carotid and vertebral arteries in the neck.   COMPARISON:  None.  FINDINGS: Criteria: Quantification of carotid stenosis is based on velocity parameters that correlate the residual internal carotid diameter with NASCET-based stenosis levels, using the diameter of the distal internal carotid lumen as the denominator for stenosis measurement.  The following velocity measurements were obtained:  RIGHT  ICA:  78/16 cm/sec  CCA:  26/8 cm/sec  SYSTOLIC ICA/CCA RATIO:  1.5  DIASTOLIC ICA/CCA RATIO:  2.5  ECA:  45 cm/sec  LEFT  ICA:  60/17 cm/sec  CCA:  34/1 cm/sec  SYSTOLIC ICA/CCA RATIO:  1.2  DIASTOLIC ICA/CCA RATIO:  2.0  ECA:  70 cm/sec  RIGHT CAROTID ARTERY: Mild echogenic shadowing plaque formation. No hemodynamically significant right ICA stenosis, velocity elevation, or turbulent flow. Degree of narrowing less than 50%.  RIGHT VERTEBRAL ARTERY:  Antegrade  LEFT CAROTID ARTERY: Similar scattered minor L echogenic plaque formation. No hemodynamically significant left ICA stenosis, velocity elevation, or turbulent flow.  LEFT VERTEBRAL ARTERY:  Not visualized, possibly occluded  IMPRESSION: Mild bilateral carotid atherosclerosis. No hemodynamically significant ICA stenosis by ultrasound. Degree of narrowing less than 50% bilaterally.  Patent antegrade right vertebral flow.  Nonvisualization of the left vertebral artery, possibly occluded.   Electronically Signed   By: Daryll Brod M.D.   On: 09/11/2014 16:33   US Venous Img Lower Unilateral Right  09/11/2014   CLINICAL DATA:  80 year old female with right lower extremity pain swelling and bruising for 1 week. Initial encounter. Personal history of breast cancer.  EXAM: RIGHT LOWER EXTREMITY VENOUS DOPPLER ULTRASOUND  TECHNIQUE: Gray-scale sonography with graded compression, as well as color Doppler and duplex ultrasound were performed to evaluate the lower extremity deep venous systems from the level of the common femoral vein and including the common femoral, femoral, profunda femoral, popliteal and calf  veins including the posterior tibial, peroneal and gastrocnemius veins when visible. The superficial great saphenous vein was also interrogated. Spectral Doppler was utilized to evaluate flow at rest and with distal augmentation maneuvers in the common femoral, femoral and popliteal veins.  COMPARISON:  None.  FINDINGS: Contralateral Common Femoral Vein: Respiratory phasicity is normal and symmetric with the symptomatic side. No evidence of thrombus. Normal compressibility.  Common Femoral Vein: No evidence of thrombus. Normal compressibility, respiratory phasicity and response to augmentation.  Saphenofemoral Junction: No evidence of thrombus. Normal compressibility and flow on color Doppler imaging.  Profunda Femoral Vein: No evidence of thrombus. Normal compressibility and flow on color Doppler imaging.  Femoral Vein: No evidence of thrombus. Normal compressibility, respiratory phasicity and response to augmentation.  Popliteal Vein: No evidence of thrombus. Normal compressibility, respiratory phasicity and response to augmentation.  Calf Veins: Limited visualization, no evidence of thrombus. Normal compressibility and flow on color Doppler imaging.  Superficial Great Saphenous Vein: No evidence of thrombus. Normal compressibility and flow on color Doppler imaging.  Venous Reflux:  None.  Other Findings: Subcutaneous edema in the calf. Crescentic fluid collection in the popliteal fossa, favor Baker cyst. This encompasses 3 cm.  IMPRESSION: 1.  No evidence of right lower extremity deep venous thrombosis. 2. Right popliteal fossa Baker cyst suspected.   Electronically Signed   By: Lars Pinks M.D.   On: 09/11/2014 11:21    Scheduled Meds: . aspirin EC  81 mg Oral Daily  . cefTRIAXone (ROCEPHIN)  IV  1 g Intravenous Q24H  . gabapentin  300 mg Oral BID  . heparin subcutaneous  5,000 Units Subcutaneous 3 times per day  . levothyroxine  75 mcg Oral QAC breakfast  . nystatin   Topical TID  . pantoprazole  40 mg  Oral Daily  . polyethylene glycol  17 g Oral Daily  . potassium chloride  20 mEq Oral BID  . predniSONE  10 mg Oral Q breakfast  . senna-docusate  1 tablet Oral BID  . simvastatin  20 mg Oral QHS  . sodium chloride  3 mL Intravenous Q12H  . vancomycin  1,000 mg Intravenous Q12H   Continuous Infusions: . 0.9 % NaCl with KCl 20 mEq / L 50 mL/hr at 09/11/14 1711   Assessment and plan:  Principal Problem:   Cellulitis of leg, right Active Problems:   Syncope   Adrenal insufficiency   UTI (lower urinary tract infection)   Abnormal EKG   Cyst, Baker's knee   Diabetes mellitus type II, controlled   Essential hypertension   Obesity   Hypothyroidism   Hypokalemia    1. Cellulitis of the right leg with associated Baker cyst. Clinically,  the erythema has not improved yet but her white blood cell count has improved and she is afebrile. There is less swelling/edema in both legs. Right lower extremity venous Doppler was negative for DVT. Blood cultures negative to date We'll continue supportive treatment and antibiotics. Wound care consult for the weeping cellulitis and multiple skin tears. Consider restarting Lasix in the next day or 2. Continue gentle Lasix for now.  Urinary tract infection. Urine culture pending. We'll continue ceftriaxone.  Syncope. This was likely secondary to vasovagal phenomenon associated with attempted defecation and urination. Her heart rate was noted to decrease upon attempted orthostatic vital signs. There may be an element of orthostasis and associated adrenal insufficiency. CT of the head revealed no acute findings. Carotid Doppler revealed no significant ICA stenosis, but there was a suggestion of left vertebral occlusion. No indication for further workup. Troponin I is negative 3. TSH and vitamin B12 pending. 2-D echocardiogram pending.  Chronic atrial fibrillation/abnormal EKG with ST-T wave abnormalities. The patient has no chest pain and her  cardiac enzymes are within normal limits. Not on anticoagulation secondary to prior intracranial hemorrhage on Coumadin. We'll continue aspirin and statin. 2-D echocardiogram pending for evaluation.  History of prosthetic tissue aortic valve replacement in 2009. 2-D echocardiogram pending.  Chronic adrenal insufficiency. She was given 175 mg stress dose of IV Solu-Cortef. Will continue prednisone orally.  Diet-controlled diabetes mellitus. Continue carbohydrate modified diet.  Hypertension. Currently stable.  Hypothyroidism. We'll continue Synthroid. TSH is pending.  Hypokalemia. Supplemented/repleted. Magnesium level was within normal limits.  Frequent falls at home. We'll ask physical therapy to evaluate her. Out of bed to the chair with assistance.         Time spent: 40 minutes.    [facility] Pager [phone]. If 7PM-7AM, please contact night-coverage at [email or web address], password [facility] 09/12/2014, 10:09 AM  LOS: 1 day

## 2014-09-12 NOTE — Progress Notes (Signed)
  Echocardiogram 2D Echocardiogram has been performed.  Lysle Rubens 09/12/2014, 11:21 AM

## 2014-09-12 NOTE — Progress Notes (Signed)
Clear drainage noted on bed linen coming from the back of patient's right leg this morning. Linens changed. Will continue to monitor.

## 2014-09-13 DIAGNOSIS — E119 Type 2 diabetes mellitus without complications: Secondary | ICD-10-CM

## 2014-09-13 DIAGNOSIS — E039 Hypothyroidism, unspecified: Secondary | ICD-10-CM

## 2014-09-13 LAB — BASIC METABOLIC PANEL
ANION GAP: 10 (ref 5–15)
BUN: 14 mg/dL (ref 6–23)
CHLORIDE: 100 meq/L (ref 96–112)
CO2: 27 mEq/L (ref 19–32)
Calcium: 9.1 mg/dL (ref 8.4–10.5)
Creatinine, Ser: 0.73 mg/dL (ref 0.50–1.10)
GFR calc non Af Amer: 79 mL/min — ABNORMAL LOW (ref >90)
Glucose, Bld: 95 mg/dL (ref 70–99)
Potassium: 4.8 mEq/L (ref 3.7–5.3)
Sodium: 137 mEq/L (ref 137–147)

## 2014-09-13 LAB — CBC
HCT: 35.2 % — ABNORMAL LOW (ref 36.0–46.0)
HEMOGLOBIN: 11.4 g/dL — AB (ref 12.0–15.0)
MCH: 30.2 pg (ref 26.0–34.0)
MCHC: 32.4 g/dL (ref 30.0–36.0)
MCV: 93.4 fL (ref 78.0–100.0)
Platelets: 229 10*3/uL (ref 150–400)
RBC: 3.77 MIL/uL — ABNORMAL LOW (ref 3.87–5.11)
RDW: 14.1 % (ref 11.5–15.5)
WBC: 12.8 10*3/uL — AB (ref 4.0–10.5)

## 2014-09-13 LAB — GLUCOSE, CAPILLARY
GLUCOSE-CAPILLARY: 81 mg/dL (ref 70–99)
GLUCOSE-CAPILLARY: 177 mg/dL — AB (ref 70–99)

## 2014-09-13 MED ORDER — FUROSEMIDE 20 MG PO TABS
10.0000 mg | ORAL_TABLET | Freq: Two times a day (BID) | ORAL | Status: DC
  Administered 2014-09-13 – 2014-09-15 (×4): 10 mg via ORAL
  Filled 2014-09-13 (×4): qty 1

## 2014-09-13 NOTE — Consult Note (Signed)
WOC wound consult note Reason for Consult:Bilateral LEs, right with stable dried serum from traumatic injury 1 month ago, left with pinpoint opening weeping due to edema Wound type:Venous insufficiency Pressure Ulcer POA: No Measurement:Left LE (medial) with 1.5 x 2.5cm dried serum (scab) from traumatic injury 1 month ago.  This is stable and I will not attempt to remove sharply, but I will dress and maintain a moist environment with twice daily changes. Right medial LE:  Pinpoint (0.2cm) round opening for which I am not able to measure depth.  This area constantly weeps serous fluid due to edema. Wound bed:AS described above. Drainage (amount, consistency, odor) As described above. Periwound: RIght LE surrounding tissue is cool and macerated.  Left LE is warm, dry. Dressing procedure/placement/frequency: As indicated above, elevation will be a cornerstone of therapy, accompanied by light compression via ACE wraps applied twice daily and as needed with topical care.  Patient is able to move herself about in bed if she has a trapeze, so I have ordered one for her today.  Additionally, I am providing a pressure redistribution chair pad for her use when OOB in what as with her LEs elevated, she will have increased pressure to her ischial tuberosities and sacrum while sitting. "Sit" time should be limited to 2-3 hours with every hour repositioning. I have ordered a saline dressing to the right medial area of d\ried serum (scab) and a xeroform gauze dressing to the left LE area that is weeping.  [facility] nursing team will not follow, but will remain available to this patient, the nursing and medical team.  Please re-consult if needed. Thanks, Maudie Flakes, MSN, RN, [place], [place], [place] [phone])

## 2014-09-13 NOTE — Progress Notes (Signed)
[name] PROGRESS NOTE  NEVAYA NAGELE JKK:[id number] DOB: [birth year] DOA: 09/11/2014 PCP: Vic Blackbird, MD    Code Status: DO NOT RESUSCITATE Family Communication: Discussed with son and daughter-in-law Disposition Plan: Discharge when clinically appropriate; the patient may need skilled nursing facility placement for physical rehabilitation.   Consultants:  None  Procedures:  2-D echocardiogram pending  Antibiotics:  Ceftriaxone 12/11>  Vancomycin 12/11>  HPI/Subjective: The patient complains of pain in her right greater than left legs. Otherwise she has no complaints. Hydrocodone or oxycodone offered, but the family stated that she becomes confused when these medications are given. They believe that her pain subsides with Tylenol only.  Objective: Filed Vitals:   09/13/14 0605  BP:   Pulse:   Temp: 98.5 F (36.9 C)  Resp: 20   oxygen saturation 98% on room air.  Intake/Output Summary (Last 24 hours) at 09/13/14 1208 Last data filed at 09/13/14 1052  Gross per 24 hour  Intake 2549.17 ml  Output   1152 ml  Net 1397.17 ml   Filed Weights   09/11/14 1459 09/12/14 0527 09/13/14 0612  Weight: 99.9 kg (220 lb 3.8 oz) 99.927 kg (220 lb 4.8 oz) 97.841 kg (215 lb 11.2 oz)    Exam:   General:  Pleasant obese 80 year old woman laying in bed, in no acute distress.  Cardiovascular: Irregular, irregular with a soft systolic murmur.  Respiratory: Clear anteriorly with decreased breath sounds in the bases.  Abdomen: Obese, positive bowel sounds, soft, nontender, nondistended.   Musculoskeletal/extremities: Right lower extremity with noticeably less edema at trace to 1+ compared to admission exam. Cellulitic area with a violaceous color from the ankle to the mid shin-less intense erythema and violet color; moderately-mildly tender; some weeping from the posterior calf-nonpurulent. Left lower extremity with a trace of edema and healing quarter size  ulceration medial aspect of the leg.  Skin: As above. In addition, she has multiple skin tears on her arms and old ecchymotic changes of her arms.  Neurologic: She is alert and oriented 3. Cranial nerves II through XII are intact. Handgrip is 5 over 5 symmetrically. She is able to raise each leg against gravity approximately 20-25.   Data Reviewed: Basic Metabolic Panel:  Recent Labs Lab 09/11/14 1015 09/11/14 1016 09/12/14 0619 09/13/14 0613  NA  --  142 141 137  K  --  3.2* 4.6 4.8  CL  --  97 100 100  CO2  --  30 27 27   GLUCOSE  --  88 161* 95  BUN  --  9 14 14   CREATININE  --  0.73 0.76 0.73  CALCIUM  --  8.7 9.1 9.1  MG 2.0  --   --   --    Liver Function Tests:  Recent Labs Lab 09/12/14 0619  AST 13  ALT 9  ALKPHOS 62  BILITOT 0.6  PROT 6.4  ALBUMIN 2.9*   No results for input(s): LIPASE, AMYLASE in the last 168 hours. No results for input(s): AMMONIA in the last 168 hours. CBC:  Recent Labs Lab 09/11/14 1016 09/12/14 0619 09/13/14 0613  WBC 19.8* 13.8* 12.8*  NEUTROABS 14.4*  --   --   HGB 13.7 12.5 11.4*  HCT 41.6 39.1 35.2*  MCV 92.2 92.9 93.4  PLT 234 221 229   Cardiac Enzymes:  Recent Labs Lab 09/11/14 1016 09/11/14 1540 09/11/14 2219  TROPONINI <0.30 <0.30 <0.30   BNP (last 3 results)  Recent Labs  12/22/13 1010  PROBNP 820.0*  CBG:  Recent Labs Lab 09/11/14 0951 09/11/14 1047 09/11/14 1702 09/13/14 0742  GLUCAP 81 103* 92 81    Recent Results (from the past 240 hour(s))  Blood culture (routine x 2)     Status: None (Preliminary result)   Collection Time: 09/11/14 11:19 AM  Result Value Ref Range Status   Specimen Description BLOOD RIGHT HAND  Final   Special Requests BOTTLES DRAWN AEROBIC ONLY 6CC  Final   Culture NO GROWTH 2 DAYS  Final   Report Status PENDING  Incomplete  Blood culture (routine x 2)     Status: None (Preliminary result)   Collection Time: 09/11/14 11:24 AM  Result Value Ref Range Status    Specimen Description RIGHT ANTECUBITAL  Final   Special Requests BOTTLES DRAWN AEROBIC AND ANAEROBIC 6CC EACH  Final   Culture NO GROWTH 2 DAYS  Final   Report Status PENDING  Incomplete     Studies: Ct Head Wo Contrast  09/11/2014   CLINICAL DATA:  Syncopal episode after a bowel movement.  EXAM: CT HEAD WITHOUT CONTRAST  TECHNIQUE: Contiguous axial images were obtained from the base of the skull through the vertex without intravenous contrast.  COMPARISON:  Head CT 12/22/2013 and brain MRI 08/27/2013  FINDINGS: Stable age related cerebral atrophy, ventriculomegaly and periventricular white matter disease. No acute intracranial findings such as hemispheric infarction or intracranial hemorrhage. No extra-axial fluid collections. 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 The brainstem and cerebellum are grossly normal and stable.  No significant bony findings. The paranasal sinuses and mastoid air cells are clear. The globes are intact.  IMPRESSION: Stable age related cerebral atrophy, ventriculomegaly and periventricular white matter disease.  No acute intracranial findings.  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   Electronically Signed   By: Kalman Jewels M.D.   On: 09/11/2014 14:27   US Carotid Bilateral  09/11/2014   CLINICAL DATA:  Syncope, hypertension, hyperlipidemia and diabetes.  EXAM: BILATERAL CAROTID DUPLEX ULTRASOUND  TECHNIQUE: Pearline Cables scale imaging, color Doppler and duplex ultrasound were performed of bilateral carotid and vertebral arteries in the neck.  COMPARISON:  None.  FINDINGS: Criteria: Quantification of carotid stenosis is based on velocity parameters that correlate the residual internal carotid diameter with NASCET-based stenosis levels, using the diameter of the distal internal carotid lumen as the denominator for stenosis measurement.  The following velocity measurements were obtained:  RIGHT  ICA:  78/16 cm/sec  CCA:  10/2 cm/sec  SYSTOLIC  ICA/CCA RATIO:  1.5  DIASTOLIC ICA/CCA RATIO:  2.5  ECA:  45 cm/sec  LEFT  ICA:  60/17 cm/sec  CCA:  72/5 cm/sec  SYSTOLIC ICA/CCA RATIO:  1.2  DIASTOLIC ICA/CCA RATIO:  2.0  ECA:  70 cm/sec  RIGHT CAROTID ARTERY: Mild echogenic shadowing plaque formation. No hemodynamically significant right ICA stenosis, velocity elevation, or turbulent flow. Degree of narrowing less than 50%.  RIGHT VERTEBRAL ARTERY:  Antegrade  LEFT CAROTID ARTERY: Similar scattered minor L echogenic plaque formation. No hemodynamically significant left ICA stenosis, velocity elevation, or turbulent flow.  LEFT VERTEBRAL ARTERY:  Not visualized, possibly occluded  IMPRESSION: Mild bilateral carotid atherosclerosis. No hemodynamically significant ICA stenosis by ultrasound. Degree of narrowing less than 50% bilaterally.  Patent antegrade right vertebral flow.  Nonvisualization of the left vertebral artery, possibly occluded.   Electronically Signed   By: Daryll Brod M.D.   On: 09/11/2014 16:33    Scheduled Meds: . aspirin EC  81 mg Oral Daily  .  cefTRIAXone (ROCEPHIN)  IV  1 g Intravenous Q24H  . gabapentin  300 mg Oral BID  . heparin subcutaneous  5,000 Units Subcutaneous 3 times per day  . levothyroxine  75 mcg Oral QAC breakfast  . nystatin   Topical TID  . pantoprazole  40 mg Oral Daily  . polyethylene glycol  17 g Oral Daily  . potassium chloride  20 mEq Oral BID  . predniSONE  10 mg Oral Q breakfast  . senna-docusate  1 tablet Oral BID  . simvastatin  20 mg Oral QHS  . sodium chloride  3 mL Intravenous Q12H  . vancomycin  1,000 mg Intravenous Q12H   Continuous Infusions:   Assessment and plan:  Principal Problem:   Cellulitis of leg, right Active Problems:   Syncope   Adrenal insufficiency   UTI (lower urinary tract infection)   Abnormal EKG   Cyst, Baker's knee   Diabetes mellitus type II, controlled   Essential hypertension   Obesity   Hypothyroidism   Hypokalemia    1. Cellulitis of the right leg  with associated Baker cyst. Clinically, the erythema/violet color and edema have improved. Her leg is still tender upon palpation. Her white blood cell count has improved and she is afebrile. Right lower extremity venous Doppler was negative for DVT. Blood cultures negative to date We'll continue supportive treatment and antibiotics. We'll continue vancomycin for another 24-48 hours before discontinuing it. Continue Tylenol as needed for pain. Wound care consult pending for the weeping cellulitis and multiple skin tears. We'll discontinue IV fluids as her blood pressure has increased.  Urinary tract infection. Urine culture pending. We'll continue ceftriaxone.  Syncope. This was likely secondary to vasovagal phenomenon associated with attempted defecation and urination. Her heart rate was noted to decrease upon attempted orthostatic vital signs. There may be an element of orthostasis and associated adrenal insufficiency. CT of the head revealed no acute findings. Carotid Doppler revealed no significant ICA stenosis, but there was a suggestion of left vertebral occlusion. No indication for further workup. Troponin I is negative 3. Free T4 and B12 are within normal limits. 2-D echocardiogram pending.  Chronic atrial fibrillation/abnormal EKG with ST-T wave abnormalities. The patient has no chest pain and her cardiac enzymes are within normal limits. Not on anticoagulation secondary to prior intracranial hemorrhage on Coumadin. We'll continue aspirin and statin. 2-D echocardiogram pending for evaluation.  Elevated blood pressure with history of hypertension. The patient had not required oral blood pressure medications for some time now. Elevation in her blood pressure may have been pain related. Continue 1 g of Tylenol 3 times a day as needed. We'll restart Lasix at 10 mg twice a day which will help with some of the leg edema and elevated blood pressure.  History of prosthetic tissue  aortic valve replacement in 2009. 2-D echocardiogram pending.  Chronic adrenal insufficiency. She was given 75 mg stress dose of IV Solu-Cortef. Will continue prednisone orally.  Diet-controlled diabetes mellitus. Continue carbohydrate modified diet.  Hypothyroidism. We'll continue Synthroid at current dose although TSH is low, but free T4 is within normal limits. She will need a follow-up TSH/free T4 and 3-4 months.  Hypokalemia. Supplemented/repleted. Magnesium level was within normal limits.  Frequent falls at home. We'll ask physical therapy to evaluate her. She may need short-term skilled nursing facility placement for rehabilitation. Out of bed to the chair with assistance.         Time spent: 30 minutes.    Saahil Herbster  [facility] Pager [phone]. If 7PM-7AM, please contact night-coverage at [email or web address], password [email or web address] 09/13/2014, 12:08 PM  LOS: 2 days

## 2014-09-14 LAB — BASIC METABOLIC PANEL
Anion gap: 11 (ref 5–15)
BUN: 12 mg/dL (ref 6–23)
CO2: 28 mEq/L (ref 19–32)
Calcium: 8.8 mg/dL (ref 8.4–10.5)
Chloride: 102 mEq/L (ref 96–112)
Creatinine, Ser: 0.64 mg/dL (ref 0.50–1.10)
GFR calc Af Amer: >90 mL/min (ref >90)
GFR calc non Af Amer: 82 mL/min — ABNORMAL LOW (ref >90)
Glucose, Bld: 92 mg/dL (ref 70–99)
Potassium: 4 mEq/L (ref 3.7–5.3)
Sodium: 141 mEq/L (ref 137–147)

## 2014-09-14 LAB — GLUCOSE, CAPILLARY: GLUCOSE-CAPILLARY: 102 mg/dL — AB (ref 70–99)

## 2014-09-14 LAB — CBC
HCT: 33.7 % — ABNORMAL LOW (ref 36.0–46.0)
HEMOGLOBIN: 11 g/dL — AB (ref 12.0–15.0)
MCH: 30.1 pg (ref 26.0–34.0)
MCHC: 32.6 g/dL (ref 30.0–36.0)
MCV: 92.3 fL (ref 78.0–100.0)
PLATELETS: 228 10*3/uL (ref 150–400)
RBC: 3.65 MIL/uL — AB (ref 3.87–5.11)
RDW: 13.9 % (ref 11.5–15.5)
WBC: 7.9 10*3/uL (ref 4.0–10.5)

## 2014-09-14 IMAGING — CT CT HEAD W/O CM
2 series · 15 of 30 positions shown, 19 images · non-contrast
Comparison: Prior CT from 08/26/2013

CLINICAL DATA: Followup intracranial hemorrhage.

EXAM:
CT HEAD WITHOUT CONTRAST
TECHNIQUE: Contiguous axial images were obtained from the base of the skull
through the vertex without intravenous contrast.

[Series 2: head w/o · axial · non-contrast · 0.49mm/px · z∈[+50,+190]mm · 13 of 34 slices shown, 17 images]
[im 3/34  brain]
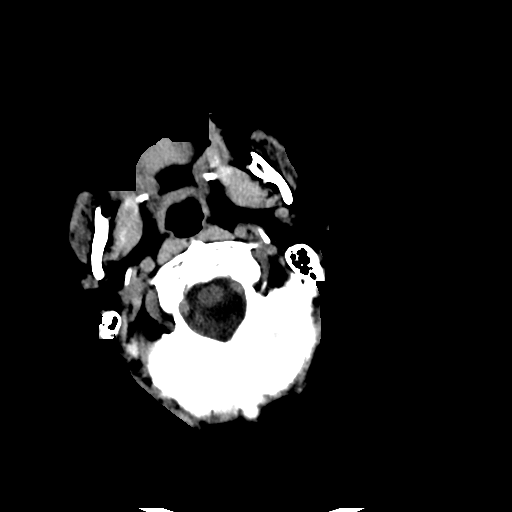
[im 3/34  bone]
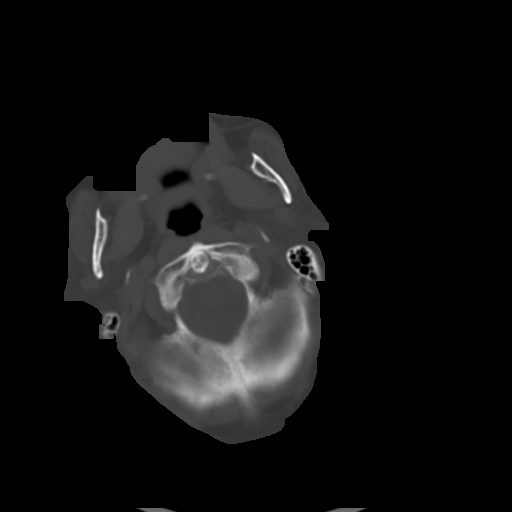
[im 5/34  brain]
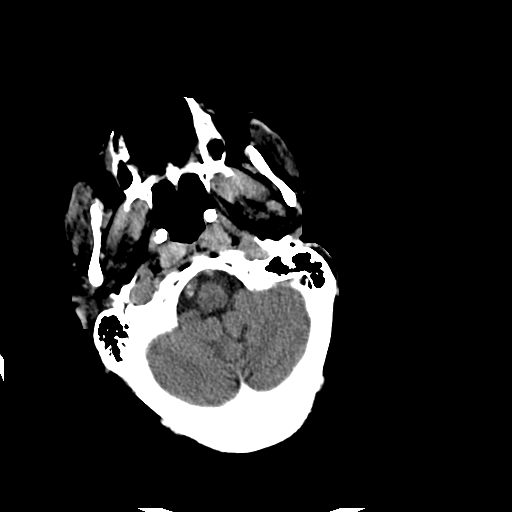
[im 8/34  brain]
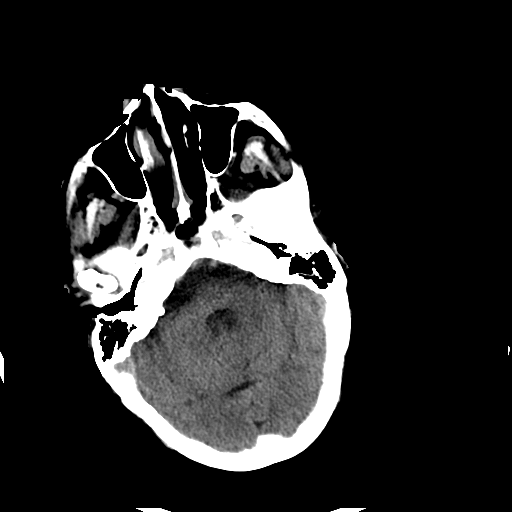
[im 10/34  brain]
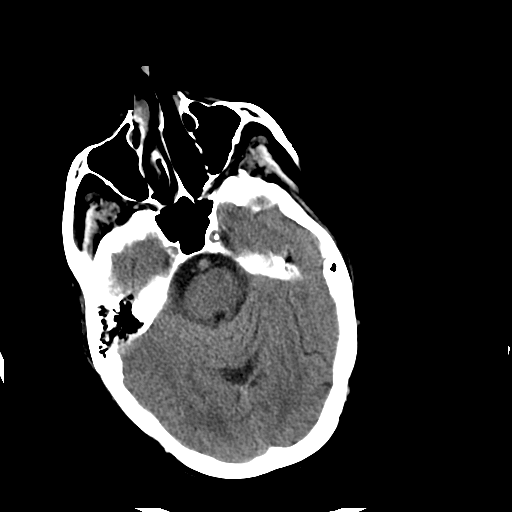
[im 12/34  brain]
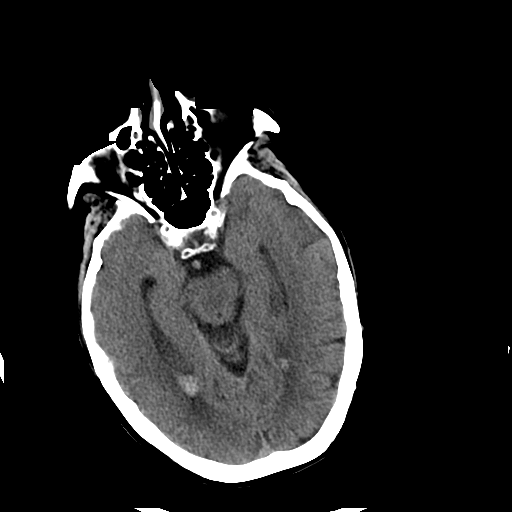
[im 12/34  bone]
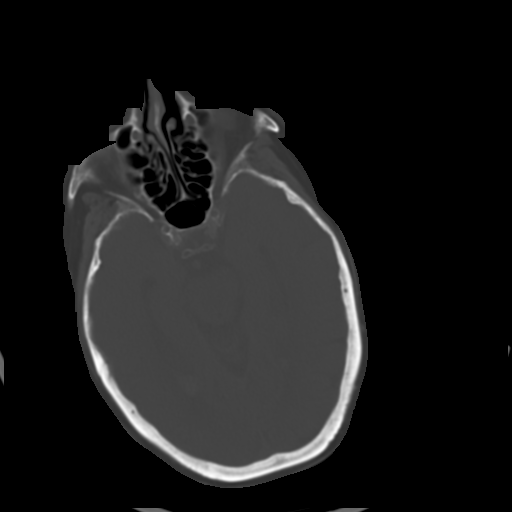
[im 15/34  brain]
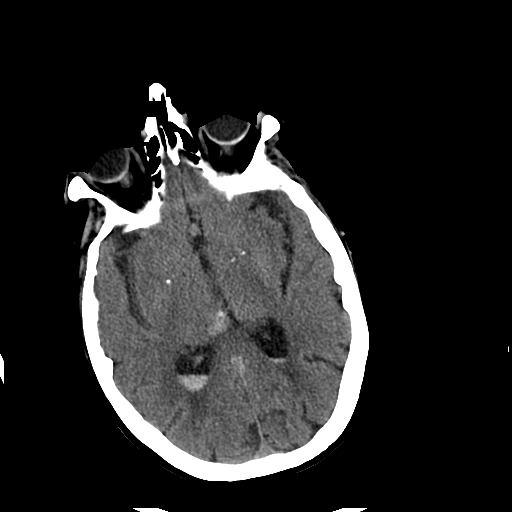
[im 17/34  brain]
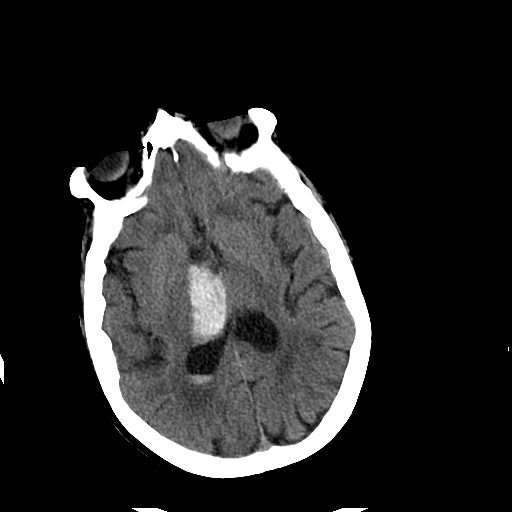
[im 19/34  brain]
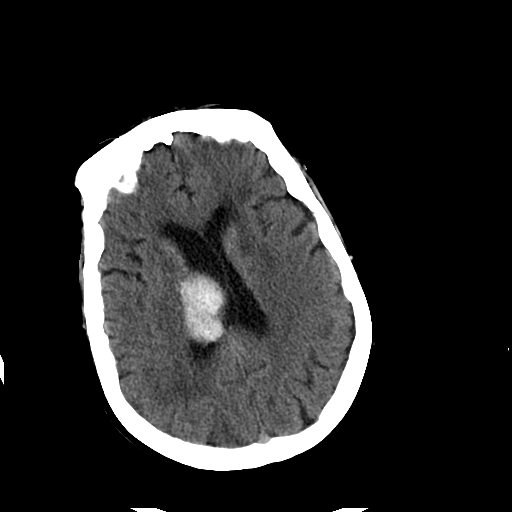
[im 22/34  brain]
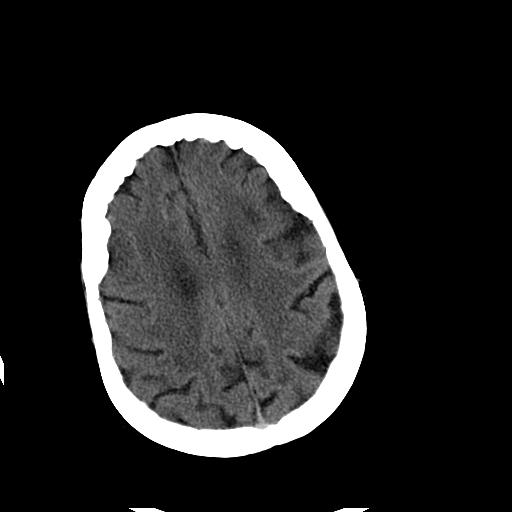
[im 22/34  bone]
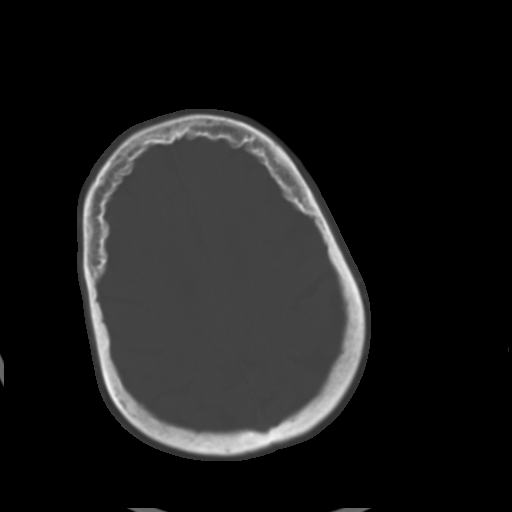
[im 24/34  brain]
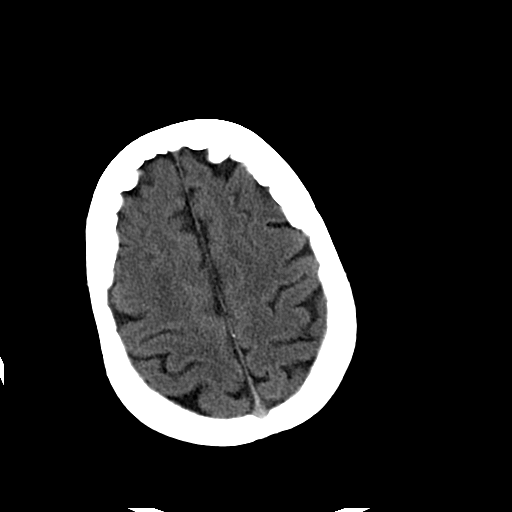
[im 26/34  brain]
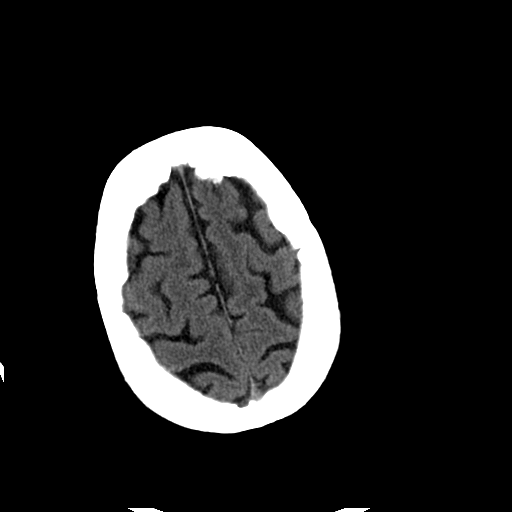
[im 29/34  brain]
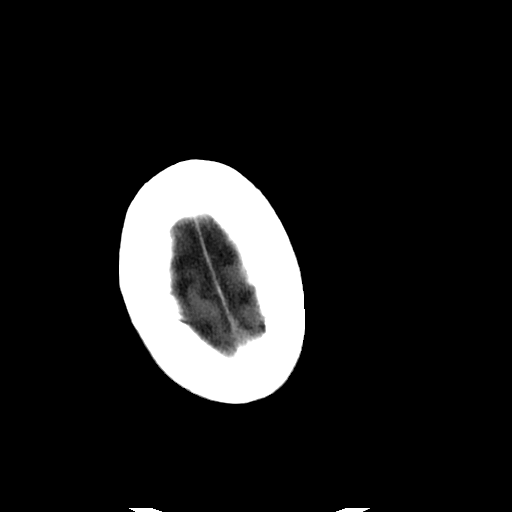
[im 31/34  brain]
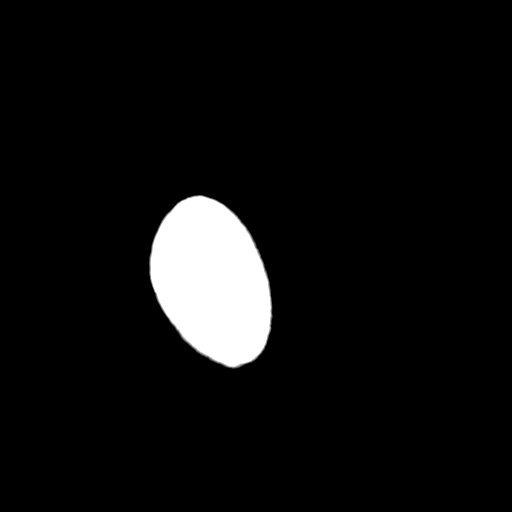
[im 31/34  bone]
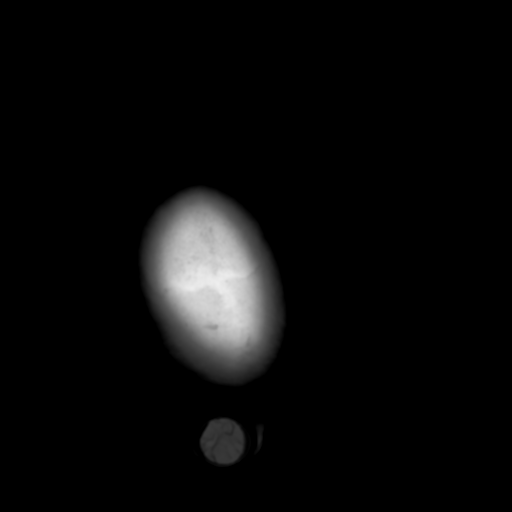

[Series 3: head w/o bone · axial · non-contrast · 0.49mm/px · z∈[+50,+75]mm · 2 of 34 slices shown]
[im 3/34  bone]
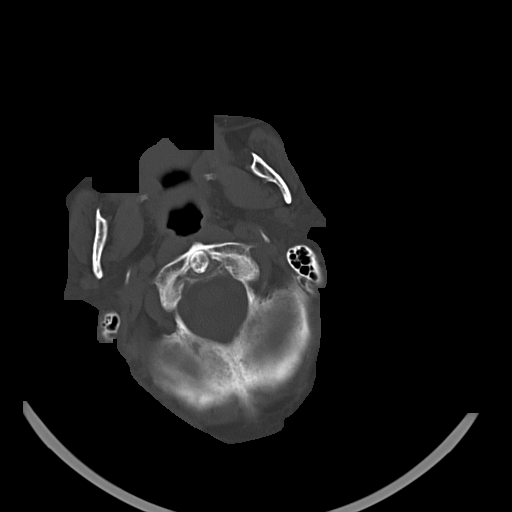
[im 8/34  bone]
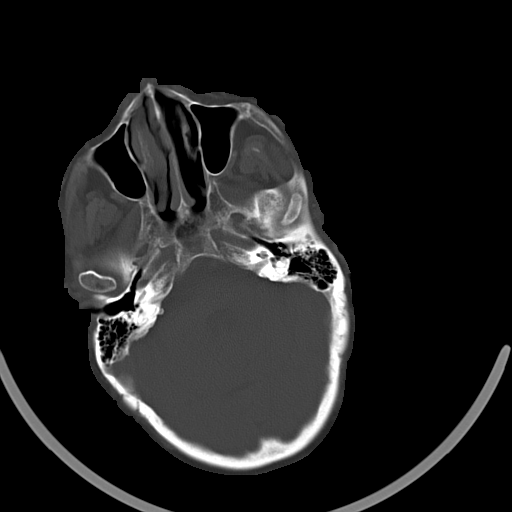

[15 of 30 positions shown; findings below may reference images not displayed]

FINDINGS: Right the lamina ache hemorrhage is grossly stable in size measuring
approximately 4.0 x 2.2 cm. There is increased blood within the
posterior horns of both lateral ventricles, compatible with
redistribution. Overall ventricular size is not significantly
changed without evidence of developing hydrocephalus at this time.
Associated right-to-left midline shift measures 7 mm. No new
intracranial hemorrhage identified.

Incidental note is again made of known anterior communicating artery
aneurysm, grossly stable.

Mild atrophy and chronic microvascular ischemic disease is again
noted, unchanged. No extra-axial fluid collection.

Calvarium remains intact. Orbits are normal. Paranasal sinuses and
mastoid air cells are clear.
IMPRESSION: 1. Similar size of right the lamina CT hemorrhage measuring 4.0 x
2.2 cm with associated 7 mm of right-to-left midline shift. No
evidence of developing hydrocephalus at this time. No new
hemorrhage.
2. Increased volume of intraventricular hemorrhage within the
posterior horns of both lateral ventricles secondary to
redistribution.
3. Stable atrophy and chronic microvascular ischemic changes.

## 2014-09-14 MED ORDER — HYDRALAZINE HCL 20 MG/ML IJ SOLN
10.0000 mg | Freq: Once | INTRAMUSCULAR | Status: AC
  Administered 2014-09-14: 10 mg via INTRAVENOUS
  Filled 2014-09-14: qty 1

## 2014-09-14 MED ORDER — METOPROLOL TARTRATE 25 MG PO TABS
12.5000 mg | ORAL_TABLET | Freq: Two times a day (BID) | ORAL | Status: DC
  Administered 2014-09-14 – 2014-09-15 (×3): 12.5 mg via ORAL
  Filled 2014-09-14 (×3): qty 1

## 2014-09-14 NOTE — Progress Notes (Signed)
[name] PROGRESS NOTE  Kristin Logan RXV:[id number] DOB: 23-Feb-1932 DOA: 09/11/2014 PCP: Vic Blackbird, MD  Assessment/Plan: 1. Cellulitis of the right leg with associated Baker cyst. She remains afebrile with normal white blood cell count. Right lower extremity venous Doppler was negative for DVT. Blood cultures negative to date Vancomycin and Rocephin day #4. Will discontinue Vancomycin Continue Tylenol as needed for pain. Appreciate wound care  Input. Follow recommendations   Urinary tract infection. Urine culture with e coli. Ceftriaxone day #4 Await sensativities.  Syncope. No further episodes. Likely secondary to vasovagal phenomenon associated with attempted defecation and urination. Her heart rate was noted to decrease upon attempted orthostatic vital signs.There may be an element of orthostasis and associated adrenal insufficiency. CT of the head revealed no acute findings. Carotid Doppler revealed no significant ICA stenosis, but there was a suggestion of left vertebral occlusion. No indication for further workup. Troponin I is negative 3. Free T4 and B12 are within normal limits. 2-D echocardiogram pending.  Chronic atrial fibrillation/abnormal EKG with ST-T wave abnormalities. The patient has no chest pain and her cardiac enzymes are within normal limits. Not on anticoagulation secondary to prior intracranial hemorrhage on Coumadin. We'll continue aspirin and statin. 2-D echocardiogram pending for evaluation.  Elevated blood pressure with history of hypertension. Chart review indicates off blood pressure medications for over 1 year.  Taking Amlodipine and metoprolol in past SBP range 181-216. DBP range 88-100. Will start low dose metoprolol with parameters.  Elevation in her blood pressure may be somewhat pain related. Continue 1 g of Tylenol 3 times a day as needed. Continue Lasix at 10 mg twice a day which will help with some of the leg edema and  elevated blood pressure.  History of prosthetic tissue aortic valve replacement in 2009. 2-D echocardiogram pending.  Chronic adrenal insufficiency. She was given 75 mg stress dose of IV Solu-Cortef. Will continue prednisone orally.  Diet-controlled diabetes mellitus.  controlled. Continue carbohydrate modified diet.  Hypothyroidism. We'll continue Synthroid at current dose although TSH is low, but free T4 is within normal limits. She will need a follow-up TSH/free T4 and 3-4 months.  Hypokalemia.  resolved. Supplemented/repleted. Magnesium level was within normal limits. Monitor  Frequent falls at home. Await physical therapy recommendations. She may need short-term skilled nursing facility placement for rehabilitation. Out of bed to the chair with assistance.    Code Status: DNR Family Communication: daughter at bedside Disposition Plan: likely snf hopefully 24-48 hours   Consultants:  WOC  Procedures:  Echo pending  Antibiotics:  Rocephin 09/11/14>>>  Vancomycin 09/11/14-09/14/14  HPI/Subjective: Complains of "hurting all over" especially arms with BP checks. Denies nausea  Objective: Filed Vitals:   09/14/14 0752  BP: 181/88  Pulse: 77  Temp: 97.8 F (36.6 C)  Resp: 20    Intake/Output Summary (Last 24 hours) at 09/14/14 0912 Last data filed at 09/14/14 0743  Gross per 24 hour  Intake    120 ml  Output   2250 ml  Net  -2130 ml   Filed Weights   09/12/14 0527 09/13/14 0612 09/14/14 0615  Weight: 99.927 kg (220 lb 4.8 oz) 97.841 kg (215 lb 11.2 oz) 97.569 kg (215 lb 1.6 oz)    Exam:   General:  Obese, in bed in no acute distress  Cardiovascular: irregularly irregular, +murmur no gallup no rub bilateral LE with ace dressings. Toes with trace -1+ LE edema.  Respiratory: normal effort BS clear bilaterally no wheezes  Abdomen: obese soft +BS  non-tender to palpation  Musculoskeletal: bilateral LE with ACE dressing dry and intact. Toes warm  to touch. LE mildly elevated  Skin: bilateral UE skin tears with old ecchymotic changes; L>R. Left arm tender to palpation. No edema noted  Data Reviewed: Basic Metabolic Panel:  Recent Labs Lab 09/11/14 1015 09/11/14 1016 09/12/14 0619 09/13/14 0613 09/14/14 0557  NA  --  142 141 137 141  K  --  3.2* 4.6 4.8 4.0  CL  --  97 100 100 102  CO2  --  30 27 27 28   GLUCOSE  --  88 161* 95 92  BUN  --  9 14 14 12   CREATININE  --  0.73 0.76 0.73 0.64  CALCIUM  --  8.7 9.1 9.1 8.8  MG 2.0  --   --   --   --    Liver Function Tests:  Recent Labs Lab 09/12/14 0619  AST 13  ALT 9  ALKPHOS 62  BILITOT 0.6  PROT 6.4  ALBUMIN 2.9*   No results for input(s): LIPASE, AMYLASE in the last 168 hours. No results for input(s): AMMONIA in the last 168 hours. CBC:  Recent Labs Lab 09/11/14 1016 09/12/14 0619 09/13/14 0613 09/14/14 0557  WBC 19.8* 13.8* 12.8* 7.9  NEUTROABS 14.4*  --   --   --   HGB 13.7 12.5 11.4* 11.0*  HCT 41.6 39.1 35.2* 33.7*  MCV 92.2 92.9 93.4 92.3  PLT 234 221 229 228   Cardiac Enzymes:  Recent Labs Lab 09/11/14 1016 09/11/14 1540 09/11/14 2219  TROPONINI <0.30 <0.30 <0.30   BNP (last 3 results)  Recent Labs  12/22/13 1010  PROBNP 820.0*   CBG:  Recent Labs Lab 09/11/14 1047 09/11/14 1702 09/13/14 0742 09/13/14 1631 09/14/14 0727  GLUCAP 103* 92 81 177* 102*    Recent Results (from the past 240 hour(s))  Urine culture     Status: None (Preliminary result)   Collection Time: 09/11/14 10:26 AM  Result Value Ref Range Status   Specimen Description URINE, CLEAN CATCH  Final   Special Requests NONE  Final   Culture  Setup Time   Final    09/11/2014 23:12 Performed at [place]   Final    >=100,000 COLONIES/ML Performed at [facility]    Culture   Final    ESCHERICHIA COLI Performed at [facility]    Report Status PENDING  Incomplete  Blood culture (routine x 2)     Status: None  (Preliminary result)   Collection Time: 09/11/14 11:19 AM  Result Value Ref Range Status   Specimen Description BLOOD RIGHT HAND  Final   Special Requests BOTTLES DRAWN AEROBIC ONLY 6CC  Final   Culture NO GROWTH 2 DAYS  Final   Report Status PENDING  Incomplete  Blood culture (routine x 2)     Status: None (Preliminary result)   Collection Time: 09/11/14 11:24 AM  Result Value Ref Range Status   Specimen Description RIGHT ANTECUBITAL  Final   Special Requests BOTTLES DRAWN AEROBIC AND ANAEROBIC 6CC EACH  Final   Culture NO GROWTH 2 DAYS  Final   Report Status PENDING  Incomplete     Studies: No results found.  Scheduled Meds: . aspirin EC  81 mg Oral Daily  . cefTRIAXone (ROCEPHIN)  IV  1 g Intravenous Q24H  . furosemide  10 mg Oral BID  . gabapentin  300 mg Oral BID  .  heparin subcutaneous  5,000 Units Subcutaneous 3 times per day  . levothyroxine  75 mcg Oral QAC breakfast  . metoprolol tartrate  12.5 mg Oral BID  . nystatin   Topical TID  . pantoprazole  40 mg Oral Daily  . polyethylene glycol  17 g Oral Daily  . predniSONE  10 mg Oral Q breakfast  . senna-docusate  1 tablet Oral BID  . simvastatin  20 mg Oral QHS  . sodium chloride  3 mL Intravenous Q12H  . vancomycin  1,000 mg Intravenous Q12H   Continuous Infusions:   Principal Problem:   Cellulitis of leg, right Active Problems:   Diabetes mellitus type II, controlled   Essential hypertension   Obesity   Hypothyroidism   Hypokalemia   Adrenal insufficiency   UTI (lower urinary tract infection)   Syncope   Abnormal EKG   Cyst, Baker's knee    Time spent: 35 minutes    [facility] Pager [phone]. If 7PM-7AM, please contact night-coverage at [email or web address], password [facility] 09/14/2014, 9:12 AM  LOS: 3 days

## 2014-09-14 NOTE — Evaluation (Signed)
Physical Therapy Evaluation Patient Details Name: Kristin Logan MRN: [id number] DOB: 1932-09-16 Today's Date: 09/14/2014   History of Present Illness  Pt is an 80 year old female with a history of stroke in 1 1-14, HTN, atrial fibrillation  on chronic anticoagulation and mild cognitive deficit.  She was admitted with cellulitis of the right LE and UTI.  Both legs are wrapped as pt is also found to have wounds on the medial left calf.  Pt has been living alone in an apartment with the assist of Cap aide 5 hours/day.  Pt has recently been minimally ambulatory and has been using a power w/c.  She has had falls related to weakness, poor balance and poor judgement and family is concerned about her safety..  Clinical Impression   Pt was seen for evaluation.  She was sleeping and wanted me to return later but was cooperative and agreed to work with me.  She was found to have deconditioning and only fair standing balance.  Her legs were wrapped with Ace Wraps with no visible drainage.  She was instructed to maintain leg elevation as much as possible.  [facility] nurse has also instructed to minimize sitting at one time to 2-3 hours due to concern about ischial skin breakdown.  Family reports that pt's apartment is very cluttered and makes it difficult for pt to navigate safely.  Because of these issues, I would recommend SNF at d/c.  Pt should not be in a setting where she is alone for periods of time.  Pt and family agree.    Follow Up Recommendations SNF    Equipment Recommendations  None recommended by PT    Recommendations for Other Services   OT    Precautions / Restrictions Precautions Precautions: Fall Precaution Comments: pt is not to be sitting more than 2-3 hours at a time due to concern about skin breakdown over the ischium Restrictions Weight Bearing Restrictions: No      Mobility  Bed Mobility Overal bed mobility: Modified Independent                Transfers Overall  transfer level: Needs assistance Equipment used: Rolling walker (2 wheeled) Transfers: Stand Pivot Transfers;Sit to/from Stand Sit to Stand: Min assist Stand pivot transfers: Min assist       General transfer comment: needed hand held assist to transfer from bed to [facility]  Ambulation/Gait Ambulation/Gait assistance: Min guard Ambulation Distance (Feet): 80 Feet Assistive device: Rolling walker (2 wheeled) Gait Pattern/deviations: Trunk flexed   Gait velocity interpretation: Below normal speed for age/gender General Gait Details: pt becomes dyspneic during gait but O2 sat at 98%...frequent rests needed during gait due to poor endurance  Stairs            Wheelchair Mobility    Modified Rankin (Stroke Patients Only)       Balance Overall balance assessment: Needs assistance Sitting-balance support: No upper extremity supported;Feet supported Sitting balance-Leahy Scale: Normal     Standing balance support: No upper extremity supported;During functional activity Standing balance-Leahy Scale: Fair                               Pertinent Vitals/Pain Pain Assessment: No/denies pain    Home Living Family/patient expects to be discharged to:: Skilled nursing facility                      Prior Function Level of Independence:  Needs assistance   Gait / Transfers Assistance Needed: supervision witgh rolling walker and use of a scooter  ADL's / Homemaking Assistance Needed: aide does the meal prep and assists with bath        Hand Dominance   Dominant Hand: Right    Extremity/Trunk Assessment               Lower Extremity Assessment: Generalized weakness      Cervical / Trunk Assessment: Kyphotic  Communication   Communication: HOH  Cognition Arousal/Alertness: Awake/alert Behavior During Therapy: WFL for tasks assessed/performed Overall Cognitive Status: History of cognitive impairments - at baseline                       General Comments      Exercises        Assessment/Plan    PT Assessment Patient needs continued PT services  PT Diagnosis Difficulty walking;Generalized weakness   PT Problem List Decreased strength;Decreased activity tolerance;Decreased balance;Decreased mobility;Obesity  PT Treatment Interventions Gait training;Functional mobility training;Therapeutic exercise;Patient/family education   PT Goals (Current goals can be found in the Care Plan section) Acute Rehab PT Goals Patient Stated Goal: wants to return home PT Goal Formulation: With patient/family Time For Goal Achievement: 09/28/14 Potential to Achieve Goals: Good    Frequency Min 3X/week   Barriers to discharge Decreased caregiver support      Co-evaluation               End of Session Equipment Utilized During Treatment: Gait belt Activity Tolerance: Patient limited by fatigue Patient left: in chair;with call bell/phone within reach;with family/visitor present Nurse Communication: Mobility status         Time: 0912-0956 PT Time Calculation (min) (ACUTE ONLY): 44 min   Charges:   PT Evaluation $Initial PT Evaluation Tier I: 1 Procedure     PT G CodesDemetrios Isaacs L 09/14/2014, 10:16 AM

## 2014-09-14 NOTE — Progress Notes (Signed)
Dressing changed to bilateral lower extremities,per wound care orders, Dr  Caryn Section notified. Will continue to monitor patient.

## 2014-09-14 NOTE — Progress Notes (Signed)
The patient was seen and examined. Her vital signs and laboratory studies were reviewed. She was discussed with nurse practitioner, Ms. Renard Hamper. Agree with her assessment and plan as discussed and outlined in her detailed dictated note.  The patient's blood pressure has increased significantly over the past 24 hours. Upon further questioning, the patient has been having pain on her left arm when the blood pressure squeezes her arm. Therefore, the accelerated blood pressure may be in part due to pain associated with blood pressure testing with a a cuff that is probably too small. The nursing staff has been asked to assess her blood pressure only with an extra large cuff and primarily manually if possible. Metoprolol at 12.5 mg twice a day has been started with parameters. Will assess over the next day or 2 if the dosing will need to be titrated up.  Her legs have Ace wraps on them per instructions by the wound care nurse. When the bandages were changed, will examine for assessment of cellulitis. Vancomycin was discontinued as outlined by Ms. Black. Skilled nursing facility placement is pending for rehabilitation. Possible discharge in the next 24-48 hours.   2-D echocardiogram results noted and discussed with patient and family. Study Conclusions - Left ventricle: The cavity size was normal. Wall thickness was increased increased in a pattern of mild to moderate LVH. Systolic function was vigorous. The estimated ejection fraction was in the range of 65% to 70%. - Aortic valve: Mean gradient (S): 15 mm Hg. Peak gradient (S): 27 mm Hg. - Mitral valve: Mildly to moderately calcified annulus. Mildly thickened leaflets . - Left atrium: The atrium was severely dilated. - Right atrium: The atrium was moderately dilated. - Systemic veins: The IVC is small and collapsed, suggesting low RA pressure and hypovolemia. - Technically difficult study.

## 2014-09-14 NOTE — Clinical Social Work Psychosocial (Signed)
Clinical Social Work Department BRIEF PSYCHOSOCIAL ASSESSMENT 09/14/2014  Patient:  Kristin Logan, Kristin Logan     Account Number:  [id number]     Admit date:  09/11/2014  Clinical Social Worker:  Kristin Logan  Date/Time:  09/14/2014 02:24 PM  Referred by:  CSW  Date Referred:  09/14/2014 Referred for  SNF Placement   Other Referral:   Interview type:  Patient Other interview type:   daughter-in-law Kristin Logan    PSYCHOSOCIAL DATA Living Status:  ALONE Admitted from facility:   Level of care:   Primary support name:  Kristin Logan and Kristin Logan Primary support relationship to patient:  CHILD, ADULT Degree of support available:   supportive per pt    CURRENT CONCERNS Current Concerns  Post-Acute Placement   Other Concerns:    SOCIAL WORK ASSESSMENT / PLAN CSW met with pt and pt's daughter-in-law Kristin Logan at bedside. Pt alert and oriented, but somewhat sleepy during assessment. She states she lives alone and enjoys her independence. Pt has 5 living children who live locally. Pt describes her son, Kristin Logan and Kristin Logan as her best support. She lived with them for awhile after her stroke in November 2014 and just moved out on her own again in August. Kristin Logan indicates they had some concerns about her living alone and her doctor also encouraged her to move back with family. Pt uses a walker or power wheelchair. She has a CAP aid for 4 hours a day, 2 hours each morning and 2 hours each evening. PT evaluated pt this morning and recommendation is for SNF. CSW discussed placement process, including Medicare coverage/criteria. Pt and Kristin Logan report understanding. Pt went to [facility] after her stroke and is requesting to return there this time. She states she did well there. SNF list provided.   Assessment/plan status:  Psychosocial Support/Ongoing Assessment of Needs Other assessment/ plan:   Information/referral to community resources:   SNF list    PATIENT'S/FAMILY'S RESPONSE TO PLAN OF CARE: Pt and family  agreeable to SNF at d/c. Requesting [facility]. Facility notified. CSW will follow up.       Kristin Logan, Kristin Logan

## 2014-09-14 NOTE — Care Management Utilization Note (Signed)
UR completed 

## 2014-09-14 NOTE — Clinical Social Work Placement (Signed)
Clinical Social Work Department CLINICAL SOCIAL WORK PLACEMENT NOTE 09/14/2014  Patient:  NALEYAH, OHLINGER  Account Number:  [id number] Admit date:  09/11/2014  Clinical Social Worker:  [name], LCSW  Date/time:  09/14/2014 02:20 PM  Clinical Social Work is seeking post-discharge placement for this patient at the following level of care:   SKILLED NURSING   (*CSW will update this form in Epic as items are completed)   09/14/2014  Patient/family provided with [place] Department of Clinical Social Work's list of facilities offering this level of care within the geographic area requested by the patient (or if unable, by the patient's family).  09/14/2014  Patient/family informed of their freedom to choose among providers that offer the needed level of care, that participate in Medicare, Medicaid or managed care program needed by the patient, have an available bed and are willing to accept the patient.  09/14/2014  Patient/family informed of [facility]' ownership interest in [facility], as well as of the fact that they are under no obligation to receive care at this facility.  PASARR submitted to EDS on  PASARR number received on   FL2 transmitted to all facilities in geographic area requested by pt/family on  09/14/2014 FL2 transmitted to all facilities within larger geographic area on   Patient informed that his/her managed care company has contracts with or will negotiate with  certain facilities, including the following:     Patient/family informed of bed offers received:   Patient chooses bed at  Physician recommends and patient chooses bed at    Patient to be transferred to  on   Patient to be transferred to facility by  Patient and family notified of transfer on  Name of family member notified:    The following physician request were entered in Epic:   Additional Comments: Pt has existing pasarr.  [name], [place]

## 2014-09-14 NOTE — Progress Notes (Signed)
Patient's BP this morning was 216/100. Orthostatic vital BPs were also elevated. Patient does express pain when BP cuff tightens due to sores on arms. Pt is not in pain after vitals are complete. MD notified. One time dose of 10mg  IV hydralazine ordered. Will continue to monitor.

## 2014-09-15 ENCOUNTER — Inpatient Hospital Stay
Admission: RE | Admit: 2014-09-15 | Discharge: 2014-10-07 | Disposition: A | Discharge status: Home / Self Care | Payer: Medicaid Other | Source: Ambulatory Visit | Attending: Internal Medicine | Admitting: Internal Medicine

## 2014-09-15 DIAGNOSIS — R0989 Other specified symptoms and signs involving the circulatory and respiratory systems: Secondary | ICD-10-CM

## 2014-09-15 DIAGNOSIS — R55 Syncope and collapse: Secondary | ICD-10-CM | POA: Insufficient documentation

## 2014-09-15 DIAGNOSIS — R05 Cough: Principal | ICD-10-CM

## 2014-09-15 DIAGNOSIS — R059 Cough, unspecified: Principal | ICD-10-CM

## 2014-09-15 LAB — BASIC METABOLIC PANEL
ANION GAP: 13 (ref 5–15)
BUN: 12 mg/dL (ref 6–23)
CHLORIDE: 102 meq/L (ref 96–112)
CO2: 29 mEq/L (ref 19–32)
Calcium: 9.1 mg/dL (ref 8.4–10.5)
Creatinine, Ser: 0.6 mg/dL (ref 0.50–1.10)
GFR calc Af Amer: >90 mL/min (ref >90)
GFR, EST NON AFRICAN AMERICAN: 84 mL/min — AB (ref >90)
Glucose, Bld: 89 mg/dL (ref 70–99)
Potassium: 3.6 mEq/L — ABNORMAL LOW (ref 3.7–5.3)
SODIUM: 144 meq/L (ref 137–147)

## 2014-09-15 LAB — GLUCOSE, CAPILLARY: GLUCOSE-CAPILLARY: 131 mg/dL — AB (ref 70–99)

## 2014-09-15 LAB — URINE CULTURE: Colony Count: >=100000

## 2014-09-15 LAB — CBC
HCT: 36.7 % (ref 36.0–46.0)
Hemoglobin: 11.7 g/dL — ABNORMAL LOW (ref 12.0–15.0)
MCH: 29.5 pg (ref 26.0–34.0)
MCHC: 31.9 g/dL (ref 30.0–36.0)
MCV: 92.4 fL (ref 78.0–100.0)
PLATELETS: 247 10*3/uL (ref 150–400)
RBC: 3.97 MIL/uL (ref 3.87–5.11)
RDW: 13.9 % (ref 11.5–15.5)
WBC: 7.4 10*3/uL (ref 4.0–10.5)

## 2014-09-15 MED ORDER — POTASSIUM CHLORIDE CRYS ER 20 MEQ PO TBCR
40.0000 meq | EXTENDED_RELEASE_TABLET | Freq: Once | ORAL | Status: AC
  Administered 2014-09-15: 40 meq via ORAL
  Filled 2014-09-15: qty 2

## 2014-09-15 MED ORDER — METOPROLOL SUCCINATE ER 25 MG PO TB24: 25.0000 mg | ORAL_TABLET | Freq: Every day | ORAL | Status: DC

## 2014-09-15 MED ORDER — SENNOSIDES-DOCUSATE SODIUM 8.6-50 MG PO TABS: 1.0000 | ORAL_TABLET | Freq: Two times a day (BID) | ORAL | Status: DC

## 2014-09-15 MED ORDER — FUROSEMIDE 20 MG PO TABS: 10.0000 mg | ORAL_TABLET | Freq: Two times a day (BID) | ORAL | Status: DC

## 2014-09-15 MED ORDER — METOPROLOL TARTRATE 12.5 MG HALF TABLET
12.5000 mg | ORAL_TABLET | Freq: Two times a day (BID) | ORAL | Status: DC
Start: 2014-09-15 — End: 2014-09-15

## 2014-09-15 MED ORDER — FUROSEMIDE 20 MG PO TABS: 20.0000 mg | ORAL_TABLET | Freq: Two times a day (BID) | ORAL | Status: DC

## 2014-09-15 MED ORDER — CEFUROXIME AXETIL 500 MG PO TABS: 500.0000 mg | ORAL_TABLET | Freq: Two times a day (BID) | ORAL | Status: AC

## 2014-09-15 MED ORDER — CIPROFLOXACIN HCL 250 MG PO TABS
250.0000 mg | ORAL_TABLET | Freq: Two times a day (BID) | ORAL | Status: DC
  Administered 2014-09-15: 250 mg via ORAL
  Filled 2014-09-15: qty 1

## 2014-09-15 NOTE — Clinical Social Work Note (Signed)
Pt and family notified no bed available at [facility]. They request [facility] and facility can accept pt. D/C today. Pt, son Clair Gulling at bedside, and facility aware and agreeable. D/C summary will be faxed upon completion. Pt to transfer with RN.  [name], [place]

## 2014-09-15 NOTE — Care Management Note (Signed)
    Page 1 of 1   09/15/2014     11:20:42 AM CARE MANAGEMENT NOTE 09/15/2014  Patient:  Kristin Logan, Kristin Logan   Account Number:  [id number]  Date Initiated:  09/14/2014  Documentation initiated by:  Vladimir Creeks  Subjective/Objective Assessment:   pt admitted with cellulitis, syncopy, falls. She is from home with sitters, but family has decided pt needs SNF, and CSW notified.i     Action/Plan:   CSW will facilite the transfer to SNF at D/C   Anticipated DC Date:  09/16/2014   Anticipated DC Plan:  [place]  In-house referral  Clinical Social Worker      DC Planning Services  CM consult      Choice offered to / List presented to:             Status of service:  Completed, signed off Medicare Important Message given?  YES (If response is "NO", the following Medicare IM given date fields will be blank) Date Medicare IM given:  09/15/2014 Medicare IM given by:  Jolene Provost Date Additional Medicare IM given:   Additional Medicare IM given by:    Discharge Disposition:  [place]  Per UR Regulation:  Reviewed for med. necessity/level of care/duration of stay  If discussed at [place] of Stay Meetings, dates discussed:    Comments:  09/15/2014 [place], RN, MSN, PCCN Pt being discharged to SNF. CSW arranging for placement. Pt has no CM needs at this time.  09/14/14 1430 Vladimir Creeks RN/CM

## 2014-09-15 NOTE — Progress Notes (Signed)
Report called and given to [facility] center. No current questions or concerns. Packet of information assembled and ready once pt is ready to be transported through tunnel

## 2014-09-15 NOTE — Discharge Summary (Signed)
Physician Discharge Summary  MELYNA HURON QDI:[id number] DOB: [birth year] DOA: 09/11/2014  PCP: Vic Blackbird, MD  Admit date: 09/11/2014 Discharge date: 09/15/2014  Time spent: 40 minutes  Recommendations for Outpatient Follow-up:  1. Dr Buelah Manis in 1 week for evaluation of LE edema, resolution of UTI. Recommend BMET to track electrolytes. Monitor BP as metoprolol started.  2. Nursing facility. Will need overbed trapeze and daily skin care to LE.   Discharge Diagnoses:  Principal Problem:   Cellulitis of leg, right Active Problems:   Diabetes mellitus type II, controlled   Essential hypertension   Obesity   Hypothyroidism   Hypokalemia   Adrenal insufficiency   UTI (lower urinary tract infection)   Syncope   Abnormal EKG   Cyst, Baker's knee   Discharge Condition: stable  Diet recommendation: heart healthy carb modified  Filed Weights   09/13/14 0612 09/14/14 0615 09/15/14 0629  Weight: 97.841 kg (215 lb 11.2 oz) 97.569 kg (215 lb 1.6 oz) 100.381 kg (221 lb 4.8 oz)    History of present illness:  Kristin Logan is a 80 y.o. female with a past medical history of A. fib no longer on Coumadin due to hemorrhagic stroke, adrenal insufficiency on chronic prednisone, hypothyroidism, breast cancer, diabetes, sleep apnea presented to the emergency department on 09/11/14 with the chief complaint of right leg pain and swelling. Initial evaluation in the emergency department revealed lower extremity cellulitis, urinary tract infection, fever and syncope.  Patient reported that she developed sudden pain in her right lower extremity that morning upon awakening. Pain was mostly in the right ankle up to mid shin. Reported difficulty bearing weight. Associated symptoms included swelling erythema that started 4 days prior. He reported a fall at that time. She saw her PCP for left arm pain after the fall. At that time she was given Lasix for lower extremity edema. She reported no  improvement. The day of admission she was visited by home health RN and noted to be somewhat confused. She denied fever chills abdominal pain nausea vomiting diarrhea constipation. She denied dysuria hematuria frequency or urgency. She denied chest pain palpitations shortness of breath headache dizziness. While in the emergency department she was assisted to the bedside commode. While attempting to have a bowel movement nursing staff reported a syncopal episode that lasted 40 seconds. Later in the afternoon she got up to the bedside commode with assistance she urinated and upon getting back to bed had a second syncopal episode. Heart rate dropped to 70 at that point. sHe became diaphoretic and quickly regained awareness.  Workup in the emergency department included basic metabolic panel significant for potassium of 3.2, complete blood count significant for leukocytosis of 19.8, urinealysis with many bacteria 3-6 WBCs positive nitrite trace leukocytes, lactic acid 2.7, chest x-ray with no acute cardiopulmonary process, EKG with A. fib and diffuse ST depression, initial troponin negative, venous Doppler of right lower leg negative for DVT possible Baker's cyst.  In the emergency department she had a rectal temp of 103 blood pressure of 101/58 heart rate of 101. sHe was not hypoxic. She was nontoxic appearing. She was provided with vancomycin and Rocephin, Tylenol and IV fluids  Hospital Course:  1. Cellulitis of the right leg with associated Baker cyst.  adm,itted to telemetry floor and provided with vancomycin and rocephin. Vancomycin discontinued 09/14/14. She quickly improved. No fever 3 days prior to discharge. Right lower extremity venous Doppler was negative for DVT. Blood cultures negative to date. Evaluated by wound  care. Per WOC note elevation will be a cornerstone of therapy, accompanied by light compression via ACE wraps applied twice daily and as needed with topical care. Patient is able to move  herself about in bed if she has a trapeze. "Sit" time should be limited to 2-3 hours with every hour repositioning. Saline dressing to the right medial area of dried serum (scab) and a xeroform gauze dressing to the left LE area that is weeping twice daily. will need close follow up at facility   Urinary tract infection. Urine culture with e coli. Ceftriaxone provided for 5 days.  Sensitive to cipro   Syncope. No further episodes. Likely secondary to vasovagal phenomenon associated with attempted defecation and urination. Her heart rate was noted to decrease upon attempted orthostatic vital signs.There may be an element of orthostasis and associated adrenal insufficiency. CT of the head revealed no acute findings. Carotid Doppler revealed no significant ICA stenosis, but there was a suggestion of left vertebral occlusion. No indication for further workup. Troponin I negative 3. Free T4 and B12 are within normal limits. 2-D echocardiogram with EF 65% with mild to moderate LVH.   Chronic atrial fibrillation/abnormal EKG with ST-T wave abnormalities. The patient experienced no chest pain and her cardiac enzymes are within normal limits. Not on anticoagulation secondary to prior intracranial hemorrhage on Coumadin. Continue aspirin and statin. 2-d echo with mild to moderate LVH and EF 65-70%.  Elevated blood pressure with history of hypertension. Chart review indicates off blood pressure medications for over 1 year.  Taking Amlodipine and metoprolol in past Low dose metoprolol with parameters started and will be continued at discharge. Recommend close OP follow up for monitoring of BP  Continue Lasix at 10 mg twice a day which will help with some of the leg edema and elevated blood pressure.  History of prosthetic tissue aortic valve replacement in 2009. 2-D echocardiogram as noted above.  Chronic adrenal insufficiency. She was given 75 mg stress dose of IV Solu-Cortef. Continue daily   Prednisone at discharge.   Diet-controlled diabetes mellitus. controlled. Continue carbohydrate modified diet.  Hypothyroidism. TSH is low, but free T4 is within normal limits. She will need a follow-up TSH/free T4 and 3-4 months.  Hypokalemia. resolved. Supplemented/repleted. Magnesium level was within normal limits. Monitor  Frequent falls at home. Physical therapy recommended snf.   Procedures:  09/14/14 2 d-echo The cavity size was normal. Wall thickness was increased increased in a pattern of mild to moderate LVH. Systolic function was vigorous. The estimated ejection fraction was in the range of 65% to 70%.  Consultations:  [place]  Discharge Exam: Filed Vitals:   09/15/14 1001  BP: 162/61  Pulse:   Temp:   Resp:     General: appears comfortable Cardiovascular: irregularly irregular +murmur no gallup or rub. Bilateral ace compression dressing intact. Toes with trace edema Respiratory: normal effort BS clear bilaterally to ausculation.  Discharge Instructions You were cared for by a hospitalist during your hospital stay. If you have any questions about your discharge medications or the care you received while you were in the hospital after you are discharged, you can call the unit and asked to speak with the hospitalist on call if the hospitalist that took care of you is not available. Once you are discharged, your primary care physician will handle any further medical issues. Please note that NO REFILLS for any discharge medications will be authorized once you are discharged, as it is imperative that you return to your  primary care physician (or establish a relationship with a primary care physician if you do not have one) for your aftercare needs so that they can reassess your need for medications and monitor your lab values.   Current Discharge Medication List    START taking these medications   Details  cefUROXime (CEFTIN) 500 MG tablet Take 1 tablet (500  mg total) by mouth 2 (two) times daily with a meal. Qty: 6 tablet, Refills: 0    metoprolol succinate (TOPROL XL) 25 MG 24 hr tablet Take 1 tablet (25 mg total) by mouth daily.    senna-docusate (SENOKOT-S) 8.6-50 MG per tablet Take 1 tablet by mouth 2 (two) times daily.      CONTINUE these medications which have CHANGED   Details  furosemide (LASIX) 20 MG tablet Take 0.5 tablets (10 mg total) by mouth 2 (two) times daily. Qty: 30 tablet, Refills: 0      CONTINUE these medications which have NOT CHANGED   Details  acetaminophen (TYLENOL) 650 MG CR tablet Take 1,300 mg by mouth every 8 (eight) hours as needed for pain.    aspirin EC 81 MG tablet Take 1 tablet (81 mg total) by mouth daily.    clotrimazole-betamethasone (LOTRISONE) cream Apply 1 application topically 2 (two) times daily. Qty: 30 g, Refills: 1    diclofenac sodium (VOLTAREN) 1 % GEL Apply 2 g topically 4 (four) times daily as needed (arthritis pain to knees).    esomeprazole (NEXIUM) 20 MG capsule Take 20 mg by mouth daily at 12 noon.    gabapentin (NEURONTIN) 300 MG capsule Take 1 capsule (300 mg total) by mouth 2 (two) times daily. Qty: 60 capsule, Refills: 6    levothyroxine (SYNTHROID, LEVOTHROID) 75 MCG tablet Take 75 mcg by mouth daily before breakfast.    nystatin (MYCOSTATIN/NYSTOP) 100000 UNIT/GM POWD Apply to affected areas three times a day Qty: 60 g, Refills: 3    omeprazole (PRILOSEC) 20 MG capsule TAKE (1) CAPSULE BY MOUTH TWICE DAILY FOR HEARTBURN. Qty: 60 capsule, Refills: 5    polyethylene glycol powder (GLYCOLAX/MIRALAX) powder Take 17 g by mouth daily.    potassium chloride (K-DUR) 10 MEQ tablet TAKE 1 TABLET BY MOUTH ONCE DAILY. Qty: 30 tablet, Refills: 3    predniSONE (DELTASONE) 5 MG tablet Take 10 mg by mouth daily with breakfast.    simvastatin (ZOCOR) 20 MG tablet Take 1 tablet (20 mg total) by mouth at bedtime. Qty: 30 tablet, Refills: 6       Allergies  Allergen Reactions  .  Coumadin [Warfarin Sodium] Other (See Comments)    Patient had ICH  . Nsaids Other (See Comments)    Bleeding ulcer  . Codeine Nausea And Vomiting  . Adhesive [Tape] Rash    Redness, and peeling skin off.    Follow-up Information    Follow up with Vic Blackbird, MD. Schedule an appointment as soon as possible for a visit in 1 week.   Specialty:  Family Medicine   Why:  for evaluation of LE edema, tracking of electrolytes and resolution of UTI   Contact information:   [address] [place] [address] [phone]        The results of significant diagnostics from this hospitalization (including imaging, microbiology, ancillary and laboratory) are listed below for reference.    Significant Diagnostic Studies: Dg Chest 2 View  09/11/2014   CLINICAL DATA:  Right lower extremity pain and swelling. Hypertension.  EXAM: CHEST  2  VIEW  COMPARISON:  December 22, 2013.  FINDINGS: Stable cardiomegaly. Status post coronary artery bypass graft. Hyperexpansion of the lungs is noted suggesting chronic obstructive pulmonary disease. Atherosclerotic calcifications of thoracic aorta are noted. No pneumothorax or pleural effusion is noted. No acute pulmonary disease is noted. Surgical clips are noted in the right axillary region. Bony thorax is intact.  IMPRESSION: No acute cardiopulmonary abnormality seen.   Electronically Signed   By: Sabino Dick M.D.   On: 09/11/2014 11:51   Ct Head Wo Contrast  09/11/2014   CLINICAL DATA:  Syncopal episode after a bowel movement.  EXAM: CT HEAD WITHOUT CONTRAST  TECHNIQUE: Contiguous axial images were obtained from the base of the skull through the vertex without intravenous contrast.  COMPARISON:  Head CT 12/22/2013 and brain MRI 08/27/2013  FINDINGS: Stable age related cerebral atrophy, ventriculomegaly and periventricular white matter disease. No acute intracranial findings such as hemispheric infarction or intracranial hemorrhage. No extra-axial fluid  collections. 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 The brainstem and cerebellum are grossly normal and stable.  No significant bony findings. The paranasal sinuses and mastoid air cells are clear. The globes are intact.  IMPRESSION: Stable age related cerebral atrophy, ventriculomegaly and periventricular white matter disease.  No acute intracranial findings.  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   Electronically Signed   By: Kalman Jewels M.D.   On: 09/11/2014 14:27   US Carotid Bilateral  09/11/2014   CLINICAL DATA:  Syncope, hypertension, hyperlipidemia and diabetes.  EXAM: BILATERAL CAROTID DUPLEX ULTRASOUND  TECHNIQUE: Pearline Cables scale imaging, color Doppler and duplex ultrasound were performed of bilateral carotid and vertebral arteries in the neck.  COMPARISON:  None.  FINDINGS: Criteria: Quantification of carotid stenosis is based on velocity parameters that correlate the residual internal carotid diameter with NASCET-based stenosis levels, using the diameter of the distal internal carotid lumen as the denominator for stenosis measurement.  The following velocity measurements were obtained:  RIGHT  ICA:  78/16 cm/sec  CCA:  09/6 cm/sec  SYSTOLIC ICA/CCA RATIO:  1.5  DIASTOLIC ICA/CCA RATIO:  2.5  ECA:  45 cm/sec  LEFT  ICA:  60/17 cm/sec  CCA:  28/3 cm/sec  SYSTOLIC ICA/CCA RATIO:  1.2  DIASTOLIC ICA/CCA RATIO:  2.0  ECA:  70 cm/sec  RIGHT CAROTID ARTERY: Mild echogenic shadowing plaque formation. No hemodynamically significant right ICA stenosis, velocity elevation, or turbulent flow. Degree of narrowing less than 50%.  RIGHT VERTEBRAL ARTERY:  Antegrade  LEFT CAROTID ARTERY: Similar scattered minor L echogenic plaque formation. No hemodynamically significant left ICA stenosis, velocity elevation, or turbulent flow.  LEFT VERTEBRAL ARTERY:  Not visualized, possibly occluded  IMPRESSION: Mild bilateral carotid atherosclerosis. No hemodynamically significant ICA  stenosis by ultrasound. Degree of narrowing less than 50% bilaterally.  Patent antegrade right vertebral flow.  Nonvisualization of the left vertebral artery, possibly occluded.   Electronically Signed   By: Daryll Brod M.D.   On: 09/11/2014 16:33   US Venous Img Lower Unilateral Right  09/11/2014   CLINICAL DATA:  80 year old female with right lower extremity pain swelling and bruising for 1 week. Initial encounter. Personal history of breast cancer.  EXAM: RIGHT LOWER EXTREMITY VENOUS DOPPLER ULTRASOUND  TECHNIQUE: Gray-scale sonography with graded compression, as well as color Doppler and duplex ultrasound were performed to evaluate the lower extremity deep venous systems from the level of the common femoral vein and including the common femoral, femoral, profunda femoral, popliteal and calf veins  including the posterior tibial, peroneal and gastrocnemius veins when visible. The superficial great saphenous vein was also interrogated. Spectral Doppler was utilized to evaluate flow at rest and with distal augmentation maneuvers in the common femoral, femoral and popliteal veins.  COMPARISON:  None.  FINDINGS: Contralateral Common Femoral Vein: Respiratory phasicity is normal and symmetric with the symptomatic side. No evidence of thrombus. Normal compressibility.  Common Femoral Vein: No evidence of thrombus. Normal compressibility, respiratory phasicity and response to augmentation.  Saphenofemoral Junction: No evidence of thrombus. Normal compressibility and flow on color Doppler imaging.  Profunda Femoral Vein: No evidence of thrombus. Normal compressibility and flow on color Doppler imaging.  Femoral Vein: No evidence of thrombus. Normal compressibility, respiratory phasicity and response to augmentation.  Popliteal Vein: No evidence of thrombus. Normal compressibility, respiratory phasicity and response to augmentation.  Calf Veins: Limited visualization, no evidence of thrombus. Normal compressibility  and flow on color Doppler imaging.  Superficial Great Saphenous Vein: No evidence of thrombus. Normal compressibility and flow on color Doppler imaging.  Venous Reflux:  None.  Other Findings: Subcutaneous edema in the calf. Crescentic fluid collection in the popliteal fossa, favor Baker cyst. This encompasses 3 cm.  IMPRESSION: 1.  No evidence of right lower extremity deep venous thrombosis. 2. Right popliteal fossa Baker cyst suspected.   Electronically Signed   By: Lars Pinks M.D.   On: 09/11/2014 11:21    Microbiology: Recent Results (from the past 240 hour(s))  Urine culture     Status: None   Collection Time: 09/11/14 10:26 AM  Result Value Ref Range Status   Specimen Description URINE, CLEAN CATCH  Final   Special Requests NONE  Final   Culture  Setup Time   Final    09/11/2014 23:12 Performed at [facility]   Final    >=100,000 COLONIES/ML Performed at [facility]    Culture   Final    ESCHERICHIA COLI Performed at [facility]    Report Status 09/15/2014 FINAL  Final   Organism ID, Bacteria ESCHERICHIA COLI  Final      Susceptibility   Escherichia coli - MIC*    AMPICILLIN <=2 SENSITIVE Sensitive     CEFAZOLIN <=4 SENSITIVE Sensitive     CEFTRIAXONE <=1 SENSITIVE Sensitive     CIPROFLOXACIN <=0.25 SENSITIVE Sensitive     GENTAMICIN <=1 SENSITIVE Sensitive     LEVOFLOXACIN <=0.12 SENSITIVE Sensitive     NITROFURANTOIN <=16 SENSITIVE Sensitive     TOBRAMYCIN <=1 SENSITIVE Sensitive     TRIMETH/SULFA <=20 SENSITIVE Sensitive     PIP/TAZO <=4 SENSITIVE Sensitive     * ESCHERICHIA COLI  Blood culture (routine x 2)     Status: None (Preliminary result)   Collection Time: 09/11/14 11:19 AM  Result Value Ref Range Status   Specimen Description BLOOD RIGHT HAND  Final   Special Requests BOTTLES DRAWN AEROBIC ONLY 6CC  Final   Culture NO GROWTH 4 DAYS  Final   Report Status PENDING  Incomplete  Blood culture (routine x 2)     Status:  None (Preliminary result)   Collection Time: 09/11/14 11:24 AM  Result Value Ref Range Status   Specimen Description BLOOD RIGHT ANTECUBITAL  Final   Special Requests BOTTLES DRAWN AEROBIC AND ANAEROBIC 6CC EACH  Final   Culture NO GROWTH 4 DAYS  Final   Report Status PENDING  Incomplete     Labs: Basic Metabolic Panel:  Recent Labs  Lab 09/11/14 1015 09/11/14 1016 09/12/14 0619 09/13/14 0613 09/14/14 0557 09/15/14 0616  NA  --  142 141 137 141 144  K  --  3.2* 4.6 4.8 4.0 3.6*  CL  --  97 100 100 102 102  CO2  --  30 27 27 28 29   GLUCOSE  --  88 161* 95 92 89  BUN  --  9 14 14 12 12   CREATININE  --  0.73 0.76 0.73 0.64 0.60  CALCIUM  --  8.7 9.1 9.1 8.8 9.1  MG 2.0  --   --   --   --   --    Liver Function Tests:  Recent Labs Lab 09/12/14 0619  AST 13  ALT 9  ALKPHOS 62  BILITOT 0.6  PROT 6.4  ALBUMIN 2.9*   No results for input(s): LIPASE, AMYLASE in the last 168 hours. No results for input(s): AMMONIA in the last 168 hours. CBC:  Recent Labs Lab 09/11/14 1016 09/12/14 0619 09/13/14 0613 09/14/14 0557 09/15/14 0616  WBC 19.8* 13.8* 12.8* 7.9 7.4  NEUTROABS 14.4*  --   --   --   --   HGB 13.7 12.5 11.4* 11.0* 11.7*  HCT 41.6 39.1 35.2* 33.7* 36.7  MCV 92.2 92.9 93.4 92.3 92.4  PLT 234 221 229 228 247   Cardiac Enzymes:  Recent Labs Lab 09/11/14 1016 09/11/14 1540 09/11/14 2219  TROPONINI <0.30 <0.30 <0.30   BNP: BNP (last 3 results)  Recent Labs  12/22/13 1010  PROBNP 820.0*   CBG:  Recent Labs Lab 09/11/14 1702 09/13/14 0742 09/13/14 1631 09/14/14 0727 09/15/14 0727  GLUCAP 92 81 177* 102* 131*       Signed:  Cassandra Harbold M  [name] 09/15/2014, 2:23 PM

## 2014-09-15 NOTE — Clinical Social Work Placement (Signed)
Clinical Social Work Department CLINICAL SOCIAL WORK PLACEMENT NOTE 09/15/2014  Patient:  Kristin Logan, Kristin Logan  Account Number:  [id number] Admit date:  09/11/2014  Clinical Social Worker:  [name], LCSW  Date/time:  09/14/2014 02:20 PM  Clinical Social Work is seeking post-discharge placement for this patient at the following level of care:   SKILLED NURSING   (*CSW will update this form in Epic as items are completed)   09/14/2014  Patient/family provided with [facility] Department of Clinical Social Work's list of facilities offering this level of care within the geographic area requested by the patient (or if unable, by the patient's family).  09/14/2014  Patient/family informed of their freedom to choose among providers that offer the needed level of care, that participate in Medicare, Medicaid or managed care program needed by the patient, have an available bed and are willing to accept the patient.  09/14/2014  Patient/family informed of [facility]' ownership interest in [facility], as well as of the fact that they are under no obligation to receive care at this facility.  PASARR submitted to EDS on  PASARR number received on   FL2 transmitted to all facilities in geographic area requested by pt/family on  09/14/2014 FL2 transmitted to all facilities within larger geographic area on   Patient informed that his/her managed care company has contracts with or will negotiate with  certain facilities, including the following:     Patient/family informed of bed offers received:  09/15/2014 Patient chooses bed at [facility] Physician recommends and patient chooses bed at  [facility]  Patient to be transferred to [facility] on  09/15/2014 Patient to be transferred to facility by RN Patient and family notified of transfer on 09/15/2014 Name of family member notified:  Clair Gulling- son  The following physician request were entered in  Epic:   Additional Comments: Pt has existing pasarr.  [name], [place]

## 2014-09-15 NOTE — Evaluation (Signed)
Occupational Therapy Evaluation Patient Details Name: Kristin Logan MRN: [id number] DOB: [birth year] Today's Date: 09/15/2014    History of Present Illness Pt is an 80 year old female with a history of stroke in 1 1-14, HTN, atrial fibrillation  on chronic anticoagulation and mild cognitive deficit.  She was admitted with cellulitis of the right LE and UTI.  Both legs are wrapped as pt is also found to have wounds on the medial left calf.  Pt has been living alone in an apartment with the assist of Cap aide 5 hours/day.  Pt has recently been minimally ambulatory and has been using a power w/c.  She has had falls related to weakness, poor balance and poor judgement and family is concerned about her safety..   Clinical Impression   Pt is presenting to acute OT with above situation.  She has generalized weakness and decreased activity tolerance over baseline functioning.  She will benefit from continued OT services to ensure safety after return home and continued level of independence and safety when home alone.  Recommend SNF at this time.      Follow Up Recommendations  SNF    Equipment Recommendations  Other (comment) (Defer to SNF)    Recommendations for Other Services       Precautions / Restrictions Precautions Precautions: Fall Precaution Comments: pt is not to be sitting more than 2-3 hours at a time due to concern about skin breakdown over the ischium Restrictions Weight Bearing Restrictions: No      Mobility Bed Mobility Overal bed mobility: Needs Assistance Bed Mobility: Supine to Sit;Sit to Supine     Supine to sit: Min assist (pt repeatedly requested handheld assist, despite OTR requests to attempt sitting without therapist assist.  pt demonstrated good skills) Sit to supine: Modified independent (Device/Increase time)      Transfers Overall transfer level: Needs assistance Equipment used: Rolling walker (2 wheeled) Transfers: Sit to/from Stand Sit to Stand:  Min assist              Balance                                            ADL Overall ADL's : Needs assistance/impaired Eating/Feeding: Set up   Grooming: Wash/dry hands;Min guard;Standing;Cueing for safety Grooming Details (indicate cue type and reason): cues to remain in upright position, with decrased forward flexion                 Toilet Transfer: Moderate assistance;Regular Toilet;Grab bars;RW Armed forces technical officer Details (indicate cue type and reason): multiple attempts for sit>stand from toilet. pt uses raised seat at home, with grab bars. Toileting- Water quality scientist and Hygiene: Min guard;Sitting/lateral lean       Functional mobility during ADLs: Financial planner     Praxis      Pertinent Vitals/Pain Pain Assessment: No/denies pain     Hand Dominance Right   Extremity/Trunk Assessment Upper Extremity Assessment Upper Extremity Assessment: Overall WFL for tasks assessed;Generalized weakness (decreased activity tolerance.)           Communication Communication Communication: HOH   Cognition Arousal/Alertness: Awake/alert Behavior During Therapy: WFL for tasks assessed/performed Overall Cognitive Status: History of cognitive impairments - at  baseline                     General Comments       Exercises       Shoulder Instructions      Home Living Family/patient expects to be discharged to:: Skilled nursing facility Living Arrangements: Alone                               Additional Comments: Pt was livign with son and daughter in law until August 2015, when she decided she was well enough to live independently.  Daughter reports slow decline in indepdnecne over the past 4 month.        Prior Functioning/Environment Level of Independence: Needs assistance    ADL's / Homemaking Assistance Needed: Aides assist with IADLs (cooking,  cleaning, laundry) and some ADL tasks (min-mod assist with bathing and dressing due to concerns about BLE). Pt has been receiving assist from aides for multiple years.        OT Diagnosis: Generalized weakness   OT Problem List: Decreased strength;Decreased activity tolerance   OT Treatment/Interventions: Self-care/ADL training;Therapeutic exercise;Patient/family education;Therapeutic activities;Energy conservation    OT Goals(Current goals can be found in the care plan section) Acute Rehab OT Goals Patient Stated Goal: wants to return home OT Goal Formulation: With patient Time For Goal Achievement: 09/29/14 Potential to Achieve Goals: Good ADL Goals Pt/caregiver will Perform Home Exercise Program: Both right and left upper extremity;Increased strength  OT Frequency: Min 2X/week   Barriers to D/C:            Co-evaluation              End of Session Equipment Utilized During Treatment: Gait belt;Rolling walker  Activity Tolerance: Patient tolerated treatment well;Patient limited by fatigue Patient left: with family/visitor present;in bed;with call bell/phone within reach;with chair alarm set;with bed alarm set   Time: 6948-5462 OT Time Calculation (min): 28 min Charges:  OT General Charges $OT Visit: 1 Procedure OT Evaluation $Initial OT Evaluation Tier I: 1 Procedure G-Codes:     Bea Graff Trejon Duford, MS, OTR/L [place] [phone] 09/15/2014, 10:10 AM

## 2014-09-16 ENCOUNTER — Non-Acute Institutional Stay (SKILLED_NURSING_FACILITY): Payer: Medicare Other | Admitting: Internal Medicine

## 2014-09-16 DIAGNOSIS — I482 Chronic atrial fibrillation, unspecified: Secondary | ICD-10-CM

## 2014-09-16 DIAGNOSIS — I824Z1 Acute embolism and thrombosis of unspecified deep veins of right distal lower extremity: Secondary | ICD-10-CM

## 2014-09-16 DIAGNOSIS — R946 Abnormal results of thyroid function studies: Secondary | ICD-10-CM

## 2014-09-16 DIAGNOSIS — R7989 Other specified abnormal findings of blood chemistry: Secondary | ICD-10-CM

## 2014-09-16 DIAGNOSIS — L03115 Cellulitis of right lower limb: Secondary | ICD-10-CM

## 2014-09-16 LAB — GLUCOSE, CAPILLARY
GLUCOSE-CAPILLARY: 95 mg/dL (ref 70–99)
Glucose-Capillary: 153 mg/dL — ABNORMAL HIGH (ref 70–99)
Glucose-Capillary: 115 mg/dL — ABNORMAL HIGH (ref 70–99)

## 2014-09-16 LAB — CULTURE, BLOOD (ROUTINE X 2)
Culture: NO GROWTH
Culture: NO GROWTH

## 2014-09-17 LAB — GLUCOSE, CAPILLARY
GLUCOSE-CAPILLARY: 209 mg/dL — AB (ref 70–99)
GLUCOSE-CAPILLARY: 168 mg/dL — AB (ref 70–99)
Glucose-Capillary: 224 mg/dL — ABNORMAL HIGH (ref 70–99)
Glucose-Capillary: 100 mg/dL — ABNORMAL HIGH (ref 70–99)
Glucose-Capillary: 151 mg/dL — ABNORMAL HIGH (ref 70–99)

## 2014-09-18 LAB — GLUCOSE, CAPILLARY
GLUCOSE-CAPILLARY: 77 mg/dL (ref 70–99)
GLUCOSE-CAPILLARY: 188 mg/dL — AB (ref 70–99)
Glucose-Capillary: 126 mg/dL — ABNORMAL HIGH (ref 70–99)

## 2014-09-18 IMAGING — CT CT HEAD W/O CM
1 series · 16 of 30 positions shown, 20 images · non-contrast
Comparison: MRI brain and CT head dated 08/27/2013

CLINICAL DATA: Follow-up [facility]

EXAM:
CT HEAD WITHOUT CONTRAST
TECHNIQUE: Contiguous axial images were obtained from the base of the skull
through the vertex without intravenous contrast.

[Series 2: head 5.0 h30s · axial · 0.44mm/px · z∈[-57,+78]mm · 16 of 30 slices shown, 20 images]
[im 2/30  brain]
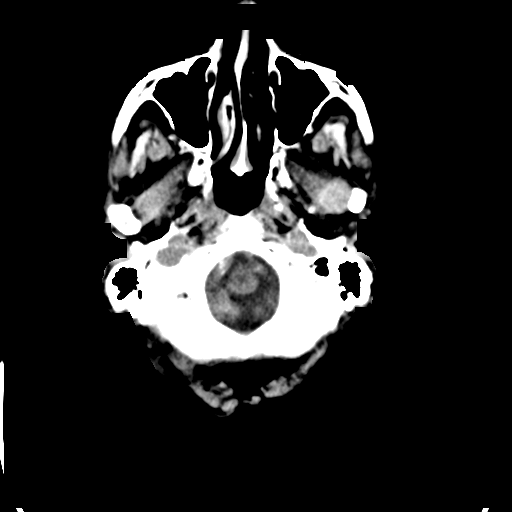
[im 2/30  bone]
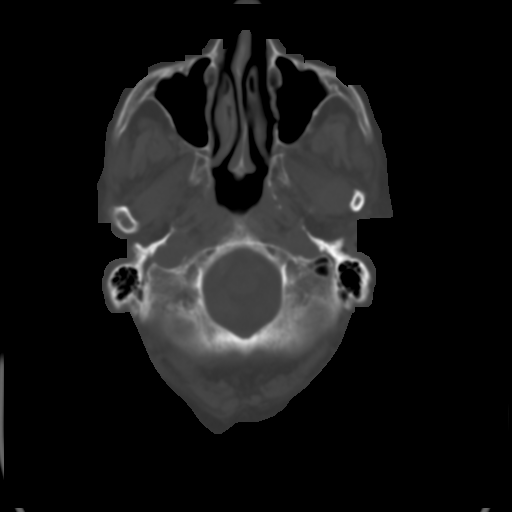
[im 4/30  brain]
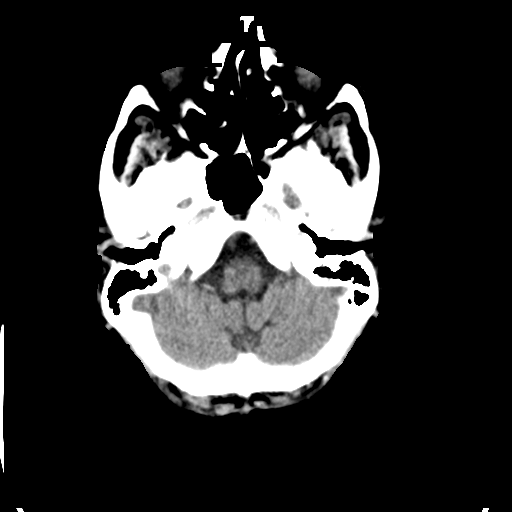
[im 6/30  brain]
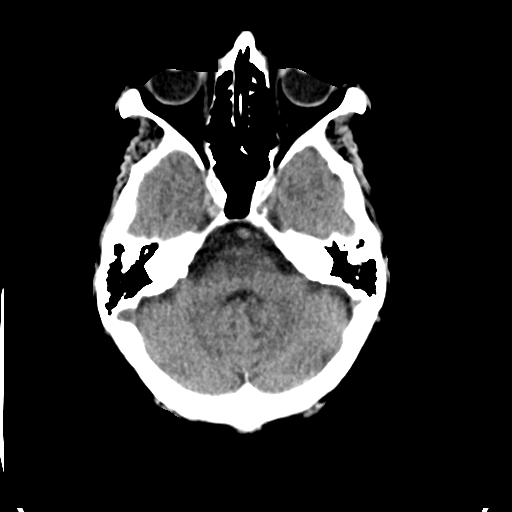
[im 8/30  brain]
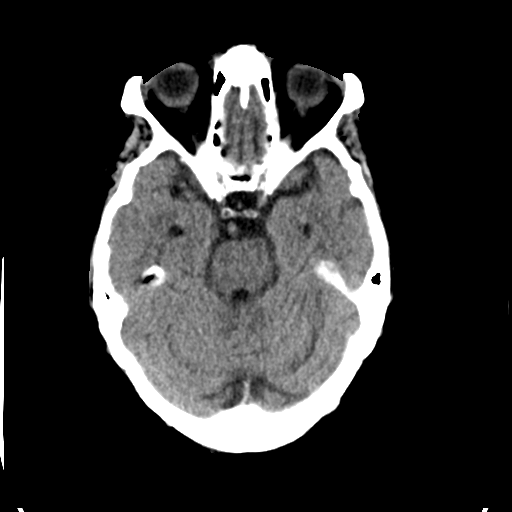
[im 9/30  brain]
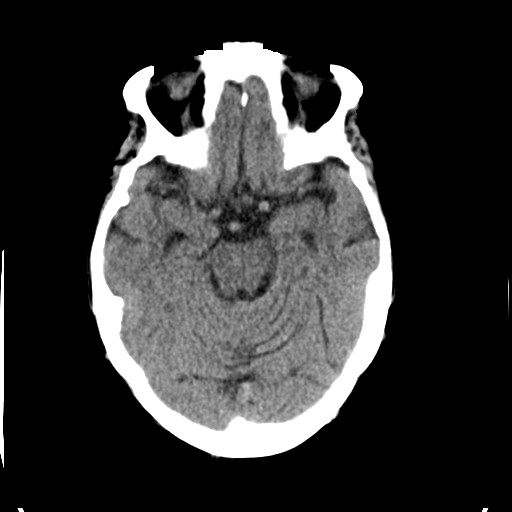
[im 9/30  bone]
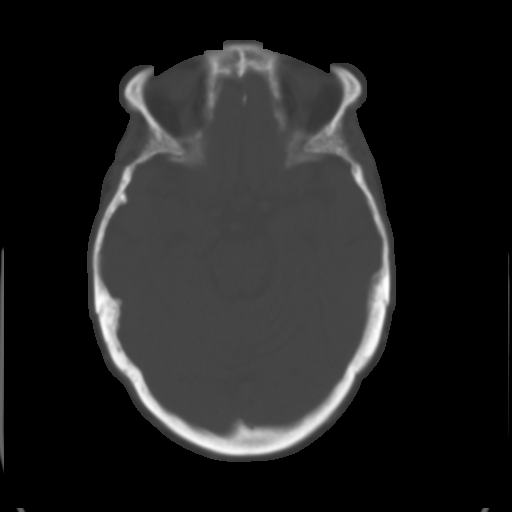
[im 11/30  brain]
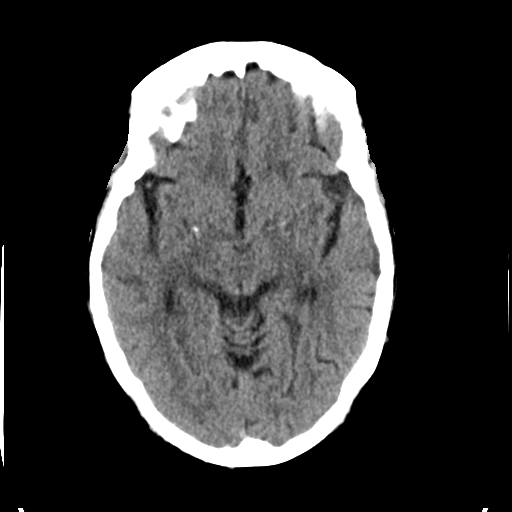
[im 13/30  brain]
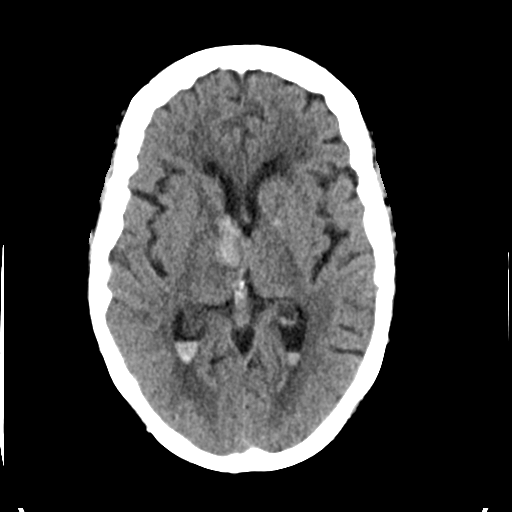
[im 15/30  brain]
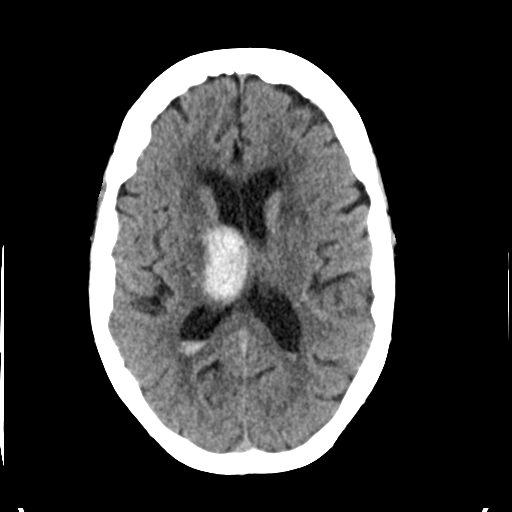
[im 16/30  brain]
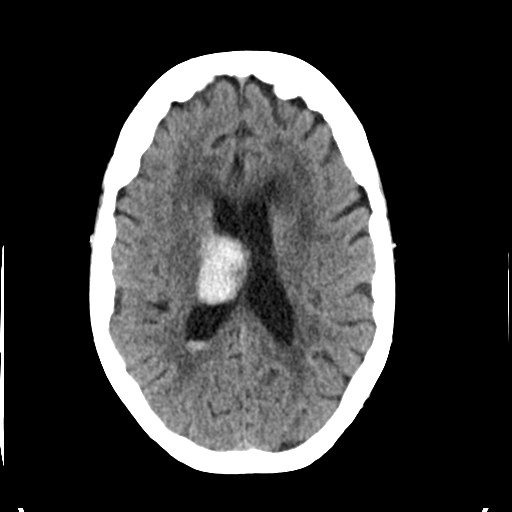
[im 16/30  bone]
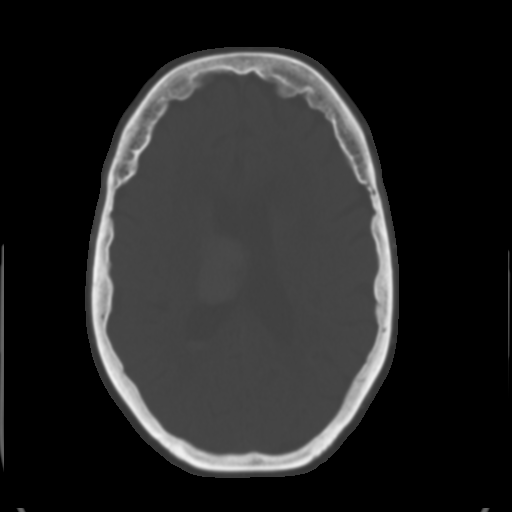
[im 18/30  brain]
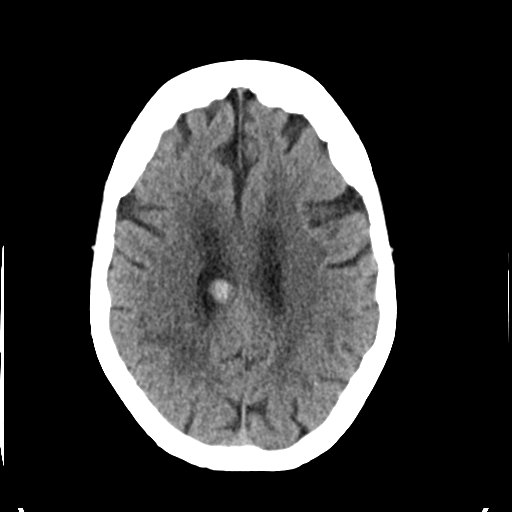
[im 20/30  brain]
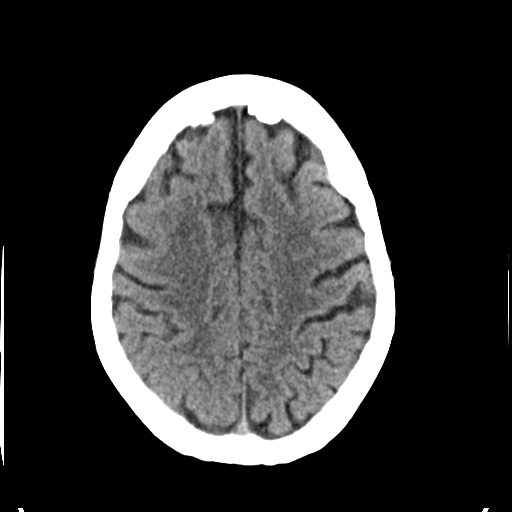
[im 22/30  brain]
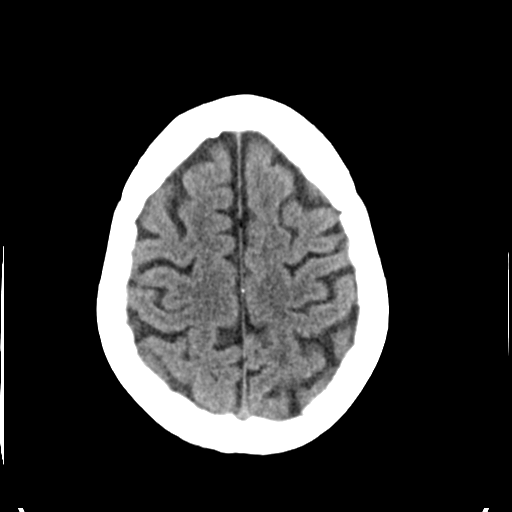
[im 23/30  brain]
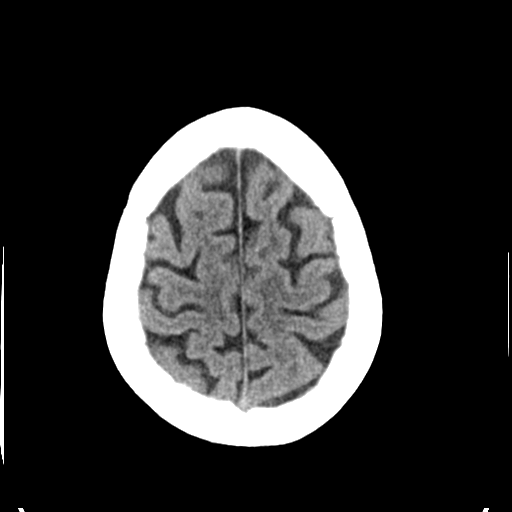
[im 23/30  bone]
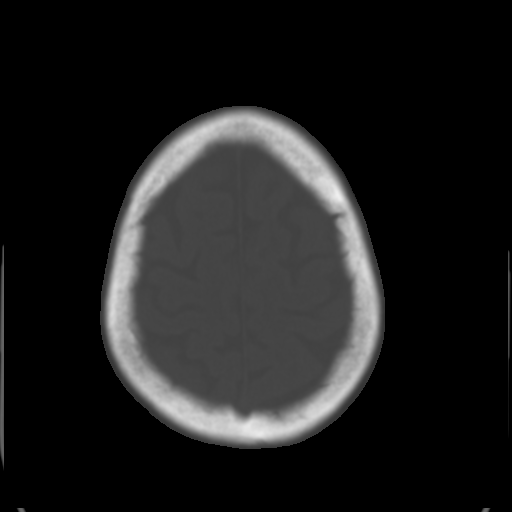
[im 25/30  brain]
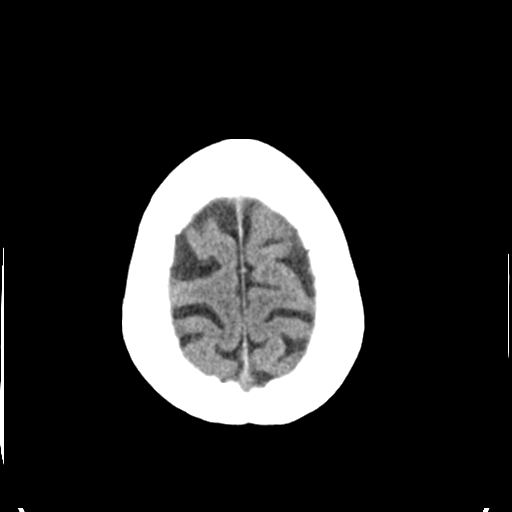
[im 27/30  brain]
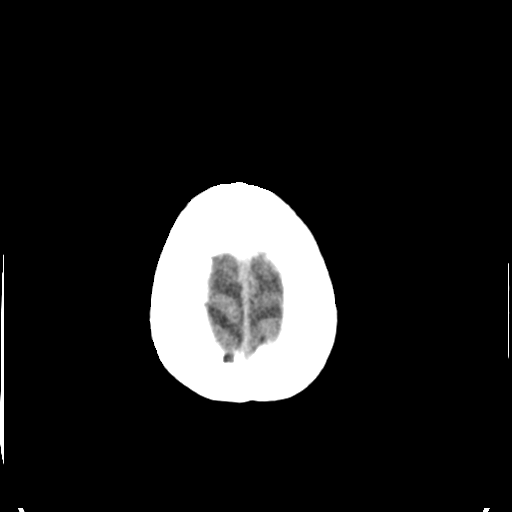
[im 29/30  brain]
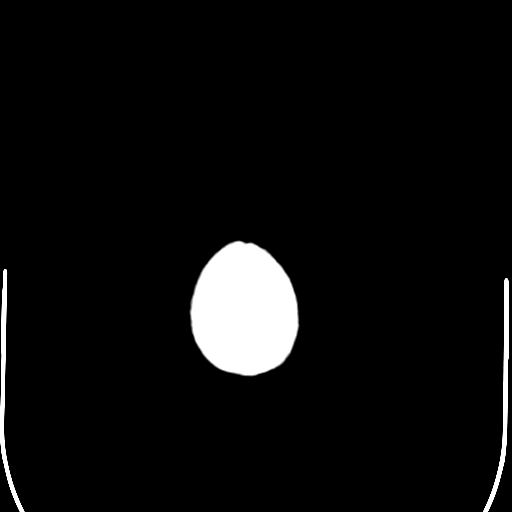

[16 of 30 positions shown; findings below may reference images not displayed]

FINDINGS: 3.3 x 2.0 cm right thalamic hemorrhage, previously 4.0 x 2.2 cm.
Hemorrhage bulges/mildly extends into the right lateral ventricle.

Again seen is small volume intraventricular hemorrhage layering
within the bilateral occipital horns.

3 mm leftward midline shift, previously 6.5 mm.

Age related atrophy.  No ventriculomegaly.

The visualized paranasal sinuses are essentially clear. The mastoid
air cells are unopacified.

No evidence of calvarial fracture.
IMPRESSION: 3.3 cm right thalamic hemorrhage, decreased.

Layering intraventricular hemorrhage in the bilateral occipital
horns, grossly unchanged.

3 mm leftward midline shift, decreased.

## 2014-09-19 ENCOUNTER — Non-Acute Institutional Stay (SKILLED_NURSING_FACILITY): Payer: Medicare Other | Admitting: Internal Medicine

## 2014-09-19 ENCOUNTER — Encounter: Payer: Self-pay | Admitting: Internal Medicine

## 2014-09-19 DIAGNOSIS — I482 Chronic atrial fibrillation, unspecified: Secondary | ICD-10-CM

## 2014-09-19 DIAGNOSIS — R05 Cough: Secondary | ICD-10-CM

## 2014-09-19 DIAGNOSIS — R059 Cough, unspecified: Secondary | ICD-10-CM

## 2014-09-19 DIAGNOSIS — E876 Hypokalemia: Secondary | ICD-10-CM

## 2014-09-19 LAB — GLUCOSE, CAPILLARY
GLUCOSE-CAPILLARY: 113 mg/dL — AB (ref 70–99)
Glucose-Capillary: 84 mg/dL (ref 70–99)
Glucose-Capillary: 142 mg/dL — ABNORMAL HIGH (ref 70–99)

## 2014-09-20 ENCOUNTER — Inpatient Hospital Stay (HOSPITAL_COMMUNITY)
Admit: 2014-09-20 | Discharge: 2014-09-20 | Disposition: A | Discharge status: Home / Self Care | Payer: Medicare Other | Attending: Internal Medicine | Admitting: Internal Medicine

## 2014-09-20 LAB — GLUCOSE, CAPILLARY
Glucose-Capillary: 91 mg/dL (ref 70–99)
Glucose-Capillary: 115 mg/dL — ABNORMAL HIGH (ref 70–99)

## 2014-09-20 NOTE — Progress Notes (Signed)
Patient ID: Kristin Logan, female   DOB: 03-21-1932, 80 y.o.   MRN: [id number]               HISTORY & PHYSICAL  DATE:  09/16/2014    FACILITY: [facility]    LEVEL OF CARE:   SNF   CHIEF COMPLAINT:  Admission to SNF, post stay at [facility], 09/11/2014 through 09/15/2014.    HISTORY OF PRESENT ILLNESS:  Kristin Logan is a pleasant, 80 year-old woman who lives in a seniors apartment complex locally.    She presented with increasing pain in her right lower extremity.  She had previously seen her PCP for pain in the left arm after a fall.  She was given Lasix for lower extremity edema with no improvement.  When she was visited by her home health RN, she was noted to be confused.  She then went on to have a syncopal spell while having a bowel movement.    In the emergency department, she was noted to have leukocytosis with a white count of 19.8, potassium of 3.2.  Duplex ultrasound of the right lower leg was negative for DVT, showing a possible Baker's cyst.  She was admitted to the hospital with cellulitis, treated with vancomycin and Rocephin.  She rapidly improved.  Blood cultures were negative.  She was discharged with compression and Xeroform.  Unfortunately, the compression is not being applied properly.  Her feet have marked edema.   She is continued on Rocephin for five days, apparently because of E.coli UTI.  Her syncope was felt to be vasovagal.     PAST MEDICAL HISTORY/PROBLEM LIST:                 History of prosthetic aortic tissue valve in 2009.    Hypertension.  Apparently not on treatment for a year.  She was restarted on low-dose metoprolol.    Lasix 10 b.i.d.    Chronic atrial fibrillation.  Not on anticoagulation secondary to a prior intracerebral hemorrhage on Coumadin.    History of intracerebral hemorrhage from which she made a good recovery.    History of chronic adrenal insufficiency.    Diet-controlled diabetes.    Hypothyroidism, with a  suppressed TSH??   However, free T4 is within normal limits.    Hypokalemia.    History of frequent falls, although the patient tells me she has had only one recent fall.    CURRENT MEDICATIONS:  Medication list is reviewed.               Ceftin 500 two times daily for five days.     Toprol XL 25 q.d.    Senokot-S 8.6/50 b.i.d.    Lasix 10 mg b.i.d.    ASA 81 q.d.    Voltaren 2 g four times daily to arthritic knees.    Nexium 20 q.d.    Neurontin 300 two times a day.    Synthroid 75 q.d.    MiraLAX 17 g daily.    K-dur 10 mEq tablets.    Prednisone 10 mg q.d.    Zocor 20 q.d.    SOCIAL HISTORY:                HOUSING:  The patient lives in a seniors apartment.   FUNCTIONAL STATUS:  She needs some ADL assistance for bathing, which is provided by a Engineer, agricultural.  She has a rolled walker.    REVIEW OF SYSTEMS:   CHEST/RESPIRATORY:  No  shortness of breath.   CARDIAC:   No chest pain.   Prior cardiac surgery.    GI:  No diarrhea.   MUSCULOSKELETAL:   Extremities:  Unstable gait.  Painful knees.    PHYSICAL EXAMINATION:                 VITAL SIGNS:   O2 SATURATIONS:  98% on room air.   PULSE:  74 and regular.   RESPIRATIONS:  18 and unlabored.   GENERAL APPEARANCE:  The patient is not in any distress.   CHEST/RESPIRATORY:  Shallow air entry, but no crackles or wheezes.   CARDIOVASCULAR:  CARDIAC:  Heart sounds are somewhat irregular.  No murmurs.  No signs of heart failure.   GASTROINTESTINAL:  ABDOMEN:   Soft, nontender.   LIVER/SPLEEN/KIDNEYS:  No liver, no spleen.   CIRCULATION:  EDEMA/VARICOSITIES:  Extremities:  She has bilateral venous stasis.  The legs are not being wrapped properly.  She has severe edema in her feet.  I am going to change this to a collagen-based dressing with Kerlix and Coban.  She will need stockings of some sort when she goes home.         SKIN:  INSPECTION:  Her wound is on the anterior medial part of her left leg.  This is covered with  an eschar and will need to be debrided.                       NEUROLOGICAL:    BALANCE/GAIT:  She is able to bring herself to a standing position.  She has bilateral Trendelenburg gait.   MUSCULOSKELETAL:   EXTREMITIES:   BILATERAL LOWER EXTREMITIES:  Instability of both knees with weakness of the medial and lateral collateral ligaments.   PSYCHIATRIC:   MENTAL STATUS:   She is bright, alert, attentive.    ASSESSMENT/PLAN:                         Cellulitis of her leg.  Likely secondary to venous stasis and a venous stasis ulcer.  This has resolved.    Chronic venous hypertension, inflammation, and ulceration.  The wound will be debrided.  She will need a collagen-based dressing with a Kerlix/Coban wrap.    Chronic atrial fibrillation.  Not on Coumadin secondary to a history of an intracerebral hemorrhage.  Echocardiogram showed an EF of 60-75%, severely dilated left atrium, moderately dilated right atrium.  Some suggestion of hypovolemia.  She did have LVH.    Isolated suppression of the TSH.  This would suggest that the patient is at some risk for hyperthyroidism.  Her T4 was normal.  Her T3 was actually slightly depressed in November 2014.  This probably should be repeated.    Chronic adrenal insufficiency.     CPT CODE: 21308

## 2014-09-21 ENCOUNTER — Non-Acute Institutional Stay (SKILLED_NURSING_FACILITY): Payer: Medicare Other | Admitting: Internal Medicine

## 2014-09-21 ENCOUNTER — Encounter: Payer: Self-pay | Admitting: Internal Medicine

## 2014-09-21 DIAGNOSIS — R05 Cough: Secondary | ICD-10-CM | POA: Insufficient documentation

## 2014-09-21 DIAGNOSIS — R059 Cough, unspecified: Secondary | ICD-10-CM

## 2014-09-21 DIAGNOSIS — L03115 Cellulitis of right lower limb: Secondary | ICD-10-CM

## 2014-09-21 LAB — GLUCOSE, CAPILLARY
GLUCOSE-CAPILLARY: 151 mg/dL — AB (ref 70–99)
GLUCOSE-CAPILLARY: 256 mg/dL — AB (ref 70–99)
Glucose-Capillary: 81 mg/dL (ref 70–99)
Glucose-Capillary: 170 mg/dL — ABNORMAL HIGH (ref 70–99)

## 2014-09-21 NOTE — Progress Notes (Deleted)
Patient ID: Kristin Logan, female   DOB: 10/17/1931, 80 y.o.   MRN: [id number]   This is an acute visit.  Level care skilled.  Facility [facility].  Chief complaint-acute visit secondary to cough congestion.  History of present illness.  Patient is a pleasant 63 female here for rehabilitation after hospitalization for right lower extremity cellulitis.  She also was treated for a UTI.  Currently she has venous stasis ulcers on her left leg this being monitored by wound care she has a wrapping on currently.  She also has a history of atrial fibrillation as well as hypertension and a prostatic tissue aortic valve replacement back in 2009.  Patient apparently this evening developed a cough that was relatively nonproductive-she does not complain of any acute shortness of breath-however I did hear some chest congestion on exam.  Family medical social history as been reviewed per admission note on 09/16/2014.  Medications have been reviewed per MAR.  Review of systems.  General no complaints of fever or chills.  Skin does not complain of rashes or itching.  Eyes does not complain of visual changes.  Nose does not complain of a discharge.  Oropharynx-is not complaining of any sore throat  Respiratory does not complain of shortness of breath but apparently has developed a fairly significant cough relatively nonproductive does have apparently some whitish clear phlegm.  Cardiac does not complain of chest pain palpitations have significant venous stasis changes.  GI does not complain of abdominal discomfort nausea or vomiting.  A school skeletal is not complaining of joint pain currently.  Neurologic did not complain of dizziness or headache or syncopal-type feelings.  Physical exam.  Temperature is 97.7 pulse 74 respirations 20 blood pressure 151/54.  In general this is a pleasant elderly female in no distress sitting comfortably in her chair.  Her skin is warm and  dry.  Oropharynx is clear mucous membranes moist.  Eyes sclera and conjunctivae clear visual acuity appears grossly intact pupils are reactive to light.  Chest there is some slight wheezing on expiration there is no labored breathing she does have some congestion however.  Heart is irregular irregular rate and rhythm she continues with significant lower extremity edema she has wrapping on her left lower leg has compression hose on her right eye would say one plus edema venous stasis changes erythema apparently has improved on her right leg.  Musculoskeletal moves all extremities 4 does ambulate in a wheelchair currently.  Neurologic did not appreciate any lateralizing findings her speech is clear cranial nerves grossly intact.  Labs.  09/15/2014 area  Sodium 144 potassium 3.6 BUN 12 creatinine 0.6 a do note at one time her potassium was 3.2 on December 11.  09/15/2014.  WBC 7.4 hemoglobin 11.7 platelets 247.  Assessment and plan.  #1-cough with some congestion-will treat with Mucinex 600 mg twice a day for 5 days also will start DuoNeb nebulizers every 6 hours routine 48 hours and then when necessary.  Also will need a chest x-ray.  Monitor vital signs pulse ox every shift for 72 hours  #2-history of hypokalemia-this appears to have normalize BMP is pending for next week to keep an eye on this is on supplementation   #3-atrial fibrillation-this appears to be rate controlled-she's not on anticoagulation secondary to history of intracranial hemorrhage apparently previously while she was on Coumadin-she is on a beta blocker for rate control  CPT-99309   This encounter was created in error - please disregard. This encounter was  created in error - please disregard.

## 2014-09-22 LAB — GLUCOSE, CAPILLARY
GLUCOSE-CAPILLARY: 160 mg/dL — AB (ref 70–99)
GLUCOSE-CAPILLARY: 140 mg/dL — AB (ref 70–99)
Glucose-Capillary: 114 mg/dL — ABNORMAL HIGH (ref 70–99)
Glucose-Capillary: 159 mg/dL — ABNORMAL HIGH (ref 70–99)

## 2014-09-23 LAB — GLUCOSE, CAPILLARY
GLUCOSE-CAPILLARY: 85 mg/dL (ref 70–99)
GLUCOSE-CAPILLARY: 238 mg/dL — AB (ref 70–99)
Glucose-Capillary: 152 mg/dL — ABNORMAL HIGH (ref 70–99)
Glucose-Capillary: 162 mg/dL — ABNORMAL HIGH (ref 70–99)

## 2014-09-23 NOTE — Progress Notes (Signed)
Patient ID: Kristin Logan, female   DOB: 12/23/1931, 80 y.o.   MRN: [id number]               PROGRESS NOTE  DATE:  09/21/2014     FACILITY: [facility]    LEVEL OF CARE:   SNF   Acute Visit   CHIEF COMPLAINT:  Follow up multiple medical issues.    HISTORY OF PRESENT ILLNESS:  This is a patient who presented to hospital with cellulitis.  This rapidly improved with IV antibiotics.    She has a wound on the anterior part of her left leg.   Over the weekend, she apparently complained of cough and shortness of breath.  She had a chest x-ray, which was not particularly revealing.  However, it does show significant osteoporosis and lower thoracic wedge compression fractures.      PHYSICAL EXAMINATION:     VITAL SIGNS:   O2 SATURATIONS:  98% on room air.   RESPIRATIONS:  18 and unlabored.   PULSE:  71.  This is atrial fib.   GENERAL APPEARANCE:  The patient is not in any distress.   CHEST/RESPIRATORY:  Shallow, but otherwise clear air entry bilaterally.  There is no wheezing.   CARDIOVASCULAR:  CARDIAC:  Heart sounds are regular.  There are no murmurs.  She appears to be euvolemic.    ASSESSMENT/PLAN:    Wound Anterior left leg: Venous Stasis physiology: Will review  Viral URTI: This appears stable

## 2014-09-24 LAB — GLUCOSE, CAPILLARY
Glucose-Capillary: 85 mg/dL (ref 70–99)
Glucose-Capillary: 151 mg/dL — ABNORMAL HIGH (ref 70–99)
Glucose-Capillary: 169 mg/dL — ABNORMAL HIGH (ref 70–99)

## 2014-09-25 LAB — GLUCOSE, CAPILLARY
GLUCOSE-CAPILLARY: 200 mg/dL — AB (ref 70–99)
Glucose-Capillary: 88 mg/dL (ref 70–99)

## 2014-09-26 LAB — GLUCOSE, CAPILLARY
Glucose-Capillary: 88 mg/dL (ref 70–99)
Glucose-Capillary: 149 mg/dL — ABNORMAL HIGH (ref 70–99)
Glucose-Capillary: 215 mg/dL — ABNORMAL HIGH (ref 70–99)
Glucose-Capillary: 207 mg/dL — ABNORMAL HIGH (ref 70–99)

## 2014-09-27 LAB — GLUCOSE, CAPILLARY
Glucose-Capillary: 98 mg/dL (ref 70–99)
Glucose-Capillary: 152 mg/dL — ABNORMAL HIGH (ref 70–99)
Glucose-Capillary: 215 mg/dL — ABNORMAL HIGH (ref 70–99)
Glucose-Capillary: 181 mg/dL — ABNORMAL HIGH (ref 70–99)

## 2014-09-28 LAB — GLUCOSE, CAPILLARY
GLUCOSE-CAPILLARY: 101 mg/dL — AB (ref 70–99)
GLUCOSE-CAPILLARY: 133 mg/dL — AB (ref 70–99)
Glucose-Capillary: 158 mg/dL — ABNORMAL HIGH (ref 70–99)
Glucose-Capillary: 180 mg/dL — ABNORMAL HIGH (ref 70–99)

## 2014-09-29 LAB — GLUCOSE, CAPILLARY
GLUCOSE-CAPILLARY: 173 mg/dL — AB (ref 70–99)
Glucose-Capillary: 111 mg/dL — ABNORMAL HIGH (ref 70–99)
Glucose-Capillary: 213 mg/dL — ABNORMAL HIGH (ref 70–99)

## 2014-09-30 LAB — GLUCOSE, CAPILLARY
GLUCOSE-CAPILLARY: 165 mg/dL — AB (ref 70–99)
Glucose-Capillary: 94 mg/dL (ref 70–99)
Glucose-Capillary: 188 mg/dL — ABNORMAL HIGH (ref 70–99)
Glucose-Capillary: 209 mg/dL — ABNORMAL HIGH (ref 70–99)

## 2014-09-30 NOTE — Progress Notes (Signed)
Patient ID: Kristin Logan, female   DOB: Jan 07, 1932, 80 y.o.   MRN: [id number]           date is 09/19/2014  This is an acute visit.  Level care skilled.  Facility [facility].  Chief complaint-acute visit secondary to cough congestion.  History of present illness.  Patient is a pleasant 67 female here for rehabilitation after hospitalization for right lower extremity cellulitis.  She also was treated for a UTI.  Currently she has venous stasis ulcers on her left leg this being monitored by wound care she has a wrapping on currently.  She also has a history of atrial fibrillation as well as hypertension and a prostatic tissue aortic valve replacement back in 2009.  Patient apparently this evening developed a cough that was relatively nonproductive-she does not complain of any acute shortness of breath-however I did hear some chest congestion on exam.  Family medical social history as been reviewed per admission note on 09/16/2014.  Medications have been reviewed per MAR.  Review of systems.  General no complaints of fever or chills.  Skin does not complain of rashes or itching.  Eyes does not complain of visual changes.  Nose does not complain of a discharge.  Oropharynx-is not complaining of any sore throat  Respiratory does not complain of shortness of breath but apparently has developed a fairly significant cough relatively nonproductive does have apparently some whitish clear phlegm.  Cardiac does not complain of chest pain palpitations have significant venous stasis changes.  GI does not complain of abdominal discomfort nausea or vomiting.  A school skeletal is not complaining of joint pain currently.  Neurologic did not complain of dizziness or headache or syncopal-type feelings.  Physical exam.  Temperature is 97.7 pulse 74 respirations 20 blood pressure 151/54.  In general this is a pleasant elderly female in no distress sitting comfortably in her  chair.  Her skin is warm and dry.  Oropharynx is clear mucous membranes moist.  Eyes sclera and conjunctivae clear visual acuity appears grossly intact pupils are reactive to light.  Chest there is some slight wheezing on expiration there is no labored breathing she does have some congestion however.  Heart is irregular irregular rate and rhythm she continues with significant lower extremity edema she has wrapping on her left lower leg has compression hose on her right eye would say one plus edema venous stasis changes erythema apparently has improved on her right leg.  Musculoskeletal moves all extremities 4 does ambulate in a wheelchair currently.  Neurologic did not appreciate any lateralizing findings her speech is clear cranial nerves grossly intact.  Labs.  09/15/2014 area  Sodium 144 potassium 3.6 BUN 12 creatinine 0.6 a do note at one time her potassium was 3.2 on December 11.  09/15/2014.  WBC 7.4 hemoglobin 11.7 platelets 247.  Assessment and plan.  #1-cough with some congestion-will treat with Mucinex 600 mg twice a day for 5 days also will start DuoNeb nebulizers every 6 hours routine 48 hours and then when necessary.  Also will need a chest x-ray.  Monitor vital signs pulse ox every shift for 72 hours  #2-history of hypokalemia-this appears to have normalize BMP is pending for next week to keep an eye on this is on supplementation   #3-atrial fibrillation-this appears to be rate controlled-she's not on anticoagulation secondary to history of intracranial hemorrhage apparently previously while she was on Coumadin-she is on a beta blocker for rate control  [id number]

## 2014-10-01 LAB — GLUCOSE, CAPILLARY
GLUCOSE-CAPILLARY: 144 mg/dL — AB (ref 70–99)
GLUCOSE-CAPILLARY: 231 mg/dL — AB (ref 70–99)
Glucose-Capillary: 96 mg/dL (ref 70–99)
Glucose-Capillary: 167 mg/dL — ABNORMAL HIGH (ref 70–99)

## 2014-10-02 LAB — GLUCOSE, CAPILLARY
GLUCOSE-CAPILLARY: 143 mg/dL — AB (ref 70–99)
GLUCOSE-CAPILLARY: 196 mg/dL — AB (ref 70–99)
Glucose-Capillary: 139 mg/dL — ABNORMAL HIGH (ref 70–99)
Glucose-Capillary: 223 mg/dL — ABNORMAL HIGH (ref 70–99)

## 2014-10-03 LAB — GLUCOSE, CAPILLARY
GLUCOSE-CAPILLARY: 104 mg/dL — AB (ref 70–99)
Glucose-Capillary: 116 mg/dL — ABNORMAL HIGH (ref 70–99)
Glucose-Capillary: 276 mg/dL — ABNORMAL HIGH (ref 70–99)
Glucose-Capillary: 152 mg/dL — ABNORMAL HIGH (ref 70–99)

## 2014-10-04 LAB — GLUCOSE, CAPILLARY
GLUCOSE-CAPILLARY: 86 mg/dL (ref 70–99)
GLUCOSE-CAPILLARY: 320 mg/dL — AB (ref 70–99)
Glucose-Capillary: 166 mg/dL — ABNORMAL HIGH (ref 70–99)
Glucose-Capillary: 318 mg/dL — ABNORMAL HIGH (ref 70–99)
Glucose-Capillary: 172 mg/dL — ABNORMAL HIGH (ref 70–99)
Glucose-Capillary: 172 mg/dL — ABNORMAL HIGH (ref 70–99)

## 2014-10-05 LAB — GLUCOSE, CAPILLARY
Glucose-Capillary: 99 mg/dL (ref 70–99)
Glucose-Capillary: 159 mg/dL — ABNORMAL HIGH (ref 70–99)
Glucose-Capillary: 205 mg/dL — ABNORMAL HIGH (ref 70–99)

## 2014-10-06 ENCOUNTER — Non-Acute Institutional Stay (SKILLED_NURSING_FACILITY): Payer: Medicare Other | Admitting: Internal Medicine

## 2014-10-06 DIAGNOSIS — R053 Chronic cough: Secondary | ICD-10-CM

## 2014-10-06 DIAGNOSIS — E119 Type 2 diabetes mellitus without complications: Secondary | ICD-10-CM

## 2014-10-06 DIAGNOSIS — E2740 Unspecified adrenocortical insufficiency: Secondary | ICD-10-CM

## 2014-10-06 DIAGNOSIS — L03119 Cellulitis of unspecified part of limb: Secondary | ICD-10-CM

## 2014-10-06 DIAGNOSIS — R05 Cough: Secondary | ICD-10-CM

## 2014-10-06 DIAGNOSIS — I482 Chronic atrial fibrillation, unspecified: Secondary | ICD-10-CM

## 2014-10-06 DIAGNOSIS — E038 Other specified hypothyroidism: Secondary | ICD-10-CM

## 2014-10-06 LAB — GLUCOSE, CAPILLARY
GLUCOSE-CAPILLARY: 86 mg/dL (ref 70–99)
Glucose-Capillary: 189 mg/dL — ABNORMAL HIGH (ref 70–99)
Glucose-Capillary: 216 mg/dL — ABNORMAL HIGH (ref 70–99)

## 2014-10-06 NOTE — Progress Notes (Signed)
Patient ID: Kristin Logan, female   DOB: 20-Feb-1932, 81 y.o.   MRN: [id number]               FACILITY: [facility]    LEVEL OF CARE:   SNF   CHIEF COMPLAINT:  Discharge note.    HISTORY OF PRESENT ILLNESS:  Kristin Logan is a pleasant, 81 year-old woman who lives in a seniors apartment complex locally.    She presented with increasing pain in her right lower extremity.  She had previously seen her PCP for pain in the left arm after a fall.  She was given Lasix for lower extremity edema with no improvement.  When she was visited by her home health RN, she was noted to be confused.  She then went on to have a syncopal spell while having a bowel movement.    In the emergency department, she was noted to have leukocytosis with a white count of 19.8, potassium of 3.2.  Duplex ultrasound of the right lower leg was negative for DVT, showing a possible Baker's cyst.  She was admitted to the hospital with cellulitis, treated with vancomycin and Rocephin.  She rapidly improved.  Blood cultures were negative.      She is continued on Rocephin for five days, apparently because of E.coli UTI.  Her syncope was felt to be vasovagal  Her stay here has been fairly unremarkable-she has somewhat of a chronic cough is getting nebulizers which appear to help.  She does not complain of any increased shortness of breath-she is followed by [facility]-and will have a  Cap Worker 5 hours a day for 7 days a week.  She has extensive weakness and will need PT and OT as well as nursing support for her medical issues including insulin and diabetic monitoring-also will need a shower board and hand-held shower head-.  Her blood sugars run from the high 80s-low 100s in the morning--average more in the mid 100s--at noon-largely in the mid 100s occasionally slightly above 200  later day parts   Her potassium was recently increased secondary to history of low potassium was most recently 3.4 on lab done  on January 4 this will have to be updated next week by home health     PAST MEDICAL HISTORY/PROBLEM LIST:                 History of prosthetic aortic tissue valve in 2009.    Hypertension.  Apparently not on treatment for a year.  She was restarted on low-dose metoprolol.    Lasix 10 b.i.d.    Chronic atrial fibrillation.  Not on anticoagulation secondary to a prior intracerebral hemorrhage on Coumadin.    History of intracerebral hemorrhage from which she made a good recovery.    History of chronic adrenal insufficiency.    Diet-controlled diabetes.    Hypothyroidism, with a suppressed TSH??   However, free T4 is within normal limits.    Hypokalemia.    History of frequent falls, although the patient tells me she has had only one recent fall.    CURRENT MEDICATIONS:  Medication list is reviewed.            .     Toprol XL 25 q.d.    Senokot-S 8.6/50 b.i.d.    Lasix 10 mg b.i.d.    ASA 81 q.d.    Voltaren 2 g four times daily to arthritic knees.    Nexium 20 q.d.    Neurontin  300 two times a day.    Synthroid 75 q.d.    MiraLAX 17 g daily.  Potassium 20 mEq twice a day    K-dur 10 mEq tablets.    Prednisone 10 mg q.d.    Zocor 20 q.d.    SOCIAL HISTORY:                HOUSING:  The patient lives in a seniors apartment.   FUNCTIONAL STATUS:  She needs some ADL assistance for bathing, which is provided by a Engineer, agricultural.  She has a rolling  walker.    REVIEW OF SYSTEMS In general no complaints of fever or chills.  Skin does not complain of rashes or itching cellulitis appears resolved she does have a small wound left lower leg this is monitored by wound care.  Head ears eyes nose mouth and throat-does not complain of visual changes or sore throat:   CHEST/RESPIRATORY:  No shortness of breath.--Has somewhat of a chronic cough   CARDIAC:   No chest pain.   Prior cardiac surgery.    GI:  No diarrhea. Or abdominal discomfort no nausea or vomiting    MUSCULOSKELETAL:   Extremities:  Unstable gait.  Painful knees will need extensive therapy.  Neurologic has a history of neuropathy does not complain of numbness today headache or dizziness.  .    PHYSICAL EXAMINATION:                 VITAL SIGNS:    She is afebrile pulse of 90 respirations 20 blood pressures appear variable appears he average runs 1:61 to 096E systolically [id number]   GENERAL APPEARANCE:  The patient is not in any distress. Skin is warm and dry do not see any evidence of cellulitis there is from scaling of her lower legs bilaterally with pale erythema   CHEST/RESPIRATORY:  Shallow air entry, but no crackles or wheezes small amount of rhonchi at the bases-no labored breathing.   CARDIOVASCULAR:  CARDIAC:  Heart sounds are somewhat irregular.  No murmurs.  No signs of heart failure.   GASTROINTESTINAL:  ABDOMEN:   Soft, nontender. Positive bowel sounds      INSPECTION:  Her wound is on the anterior medial part of her left leg.  was covered with dry dressing and is followed by wound care per wound care this is stable                      NEUROLOGICAL:     Appears to be grossly intact I do not see any lateralizing findings. Cranial nerves appear grossly intact her speech is clear  Musculoskeletal moves all extremities 4 I do not note any deformities continued with significant low extremity weakness is able to stand but is quite unsteady   PSYCHIATRIC:   MENTAL STATUS:   She is bright, alert, attentive.  Labs.  10/05/2014.  Sodium 139 potassium 3.4 BUN 8 creatinine 0.54 CO2 30.  09/28/2014.  Potassium was 3.0.  09/15/2014.  WBC 7.4 hemoglobin 11.7 platelets 247    ASSESSMENT/PLAN:                         Cellulitis of her leg.  Likely secondary to venous stasis and a venous stasis ulcer.  This has resolved.    Chronic venous wound-left lower leg-this is followed by wound care will need home health follow-up      Chronic atrial  fibrillation.  Not on Coumadin  secondary to a history of an intracerebral hemorrhage.  Echocardiogram showed an EF of 60-75%, severely dilated left atrium, moderately dilated right atrium.  Some suggestion of hypovolemia.  She did have LVH--this is rate controlled on Lopressor-she is on aspirin.    Isolated suppression of the TSH.  This would suggest that the patient is at some risk for hyperthyroidism.  Her T4 was normal.  Her T3 was actually slightly depressed in November 2014.  This will warrant follow up by primary care provider    Chronic adrenal insufficiency--she continues on low-dose prednisone  History of chronic cough-mild chest congestion-she does have nebulizers apparently these health per patient.    History of diabetes type 2-this appears to be relatively well controlled this is diet-controlled.    History of hypokalemia this is being supplemented again home health will need to draw a basic metabolic panel early next week and notify primary care provider of results.    [id number] note greater than 30 minutes spent on this discharge summary.     CPT CODE: [id number]

## 2014-10-07 ENCOUNTER — Non-Acute Institutional Stay (SKILLED_NURSING_FACILITY): Payer: Medicare Other | Admitting: Internal Medicine

## 2014-10-07 ENCOUNTER — Ambulatory Visit (HOSPITAL_COMMUNITY): Payer: Medicare Other | Attending: Internal Medicine

## 2014-10-07 ENCOUNTER — Encounter: Payer: Self-pay | Admitting: Internal Medicine

## 2014-10-07 DIAGNOSIS — R05 Cough: Secondary | ICD-10-CM

## 2014-10-07 DIAGNOSIS — R079 Chest pain, unspecified: Secondary | ICD-10-CM | POA: Insufficient documentation

## 2014-10-07 DIAGNOSIS — R059 Cough, unspecified: Secondary | ICD-10-CM

## 2014-10-07 LAB — GLUCOSE, CAPILLARY
Glucose-Capillary: 81 mg/dL (ref 70–99)
Glucose-Capillary: 155 mg/dL — ABNORMAL HIGH (ref 70–99)

## 2014-10-07 NOTE — Progress Notes (Signed)
Patient ID: Kristin Logan, female   DOB: [birth year], 81 y.o.   MRN: [id number]   This is an acute visit.  Level care skilled.  Facility [facility].  Chief complaint-acute visit follow-up cough-productive of green phlegm.  History of present O's.  Patient is a pleasant 81 year old female who is actually scheduled to go home today-she was here for rehabilitation after treatment for a lower extremity cellulitis which has resolved unremarkably-she also has a history of chronic atrial fibrillation along with chronic adrenal insufficiency she is on low-dose prednisone.  She was seen approximately 3 weeks ago for a cough-chest x-ray did not show any acute process this was thought to be viral-she was treated with Mucinex as well as duo nebs.  She continued to have somewhat of a cough-however today apparently developed some green phlegm per nursing.  Her vital signs continued to be stable area  O2 saturations are satisfactory in the 90s-100-she does not complain of any increased shortness of breath fever or chills.  Family medical social history reviewed per admission note on 09/16/2014.  Medications have been reviewed per MAR.  Review of systems.  In general no complaints of fever or chills.  Skin does not complain of rashes or itching.  Ears nose mouth and throat does not complain of nasal drainage or sore throat.  Respiratory has a cough as noted above does not complain of shortness of breath however.  Cardiac does not complain of chest pain edema appears significantly improved.  Muscle skeletal does not complain of joint pain.  Neurologic is not complaining of any dizziness or headache.  Physical exam.  Temperature is 98.3 pulse 87 respirations 20 blood pressure 155/84 O2 saturation is 100% on room air.  In general this is a very pleasant elderly female in no distress she is quite motivated to go home.  Her skin is warm and dry.  Oropharynx clear mucous membranes  moist.  Chest she has somewhat shallow air entry which is baseline there is some scattered rhonchi there is no labored breathing.  R-is regular irregular rate and rhythm without murmur gallop or rub she has minimal lower extremity edema.  Abdomen soft obese soft nontender positive bowel sounds.  Musculoskeletal moves all extremities 4 ambulating in a wheelchair today I do not see any deformities.  Neurologic is grossly intact no lateralizing findings her speech is clear.  Psych she is bright alert pleasant and appropriate.  Labs.  January fourth 2016.  Sodium 139 potassium 3.4 BUN 8 creatinine 0.54.  09/15/2014.  WBC 7.4 hemoglobin 11.7 platelets 247.  Assessment and plan.  #1-cough apparently productive of green phlegm-one would be concerned about of bacterial etiology here-will start Augmentin 500 mg bid for 7 days-also will restart the Mucinex 600 mg twice a day for 7 days-she already has a order for duo-nebs every 6 hours when necessary this will be continued at home.  Clinically she appears to be stable certainly no sign of distress here-will obtain a chest x-ray as well.  She is very motivated to go home in fact she is already put her coat on-this will need follow-up by her primary care provider--she appears clinically stable enough to go home-will awaitresults of chest x-ray although will empirically put on an antibiotic especially in light of green sputum.   .  [id number]

## 2014-10-11 ENCOUNTER — Encounter: Payer: Self-pay | Admitting: Internal Medicine

## 2014-10-13 ENCOUNTER — Ambulatory Visit (INDEPENDENT_AMBULATORY_CARE_PROVIDER_SITE_OTHER): Payer: Medicare Other | Admitting: Family Medicine

## 2014-10-13 ENCOUNTER — Encounter: Payer: Self-pay | Admitting: Family Medicine

## 2014-10-13 VITALS — BP 128/74 | HR 76 | Temp 98.4°F | Resp 16 | Ht 65.0 in | Wt 217.0 lb

## 2014-10-13 DIAGNOSIS — E1143 Type 2 diabetes mellitus with diabetic autonomic (poly)neuropathy: Secondary | ICD-10-CM

## 2014-10-13 DIAGNOSIS — J209 Acute bronchitis, unspecified: Secondary | ICD-10-CM

## 2014-10-13 DIAGNOSIS — R2681 Unsteadiness on feet: Secondary | ICD-10-CM

## 2014-10-13 DIAGNOSIS — M170 Bilateral primary osteoarthritis of knee: Secondary | ICD-10-CM

## 2014-10-13 DIAGNOSIS — E1140 Type 2 diabetes mellitus with diabetic neuropathy, unspecified: Secondary | ICD-10-CM | POA: Insufficient documentation

## 2014-10-13 DIAGNOSIS — E876 Hypokalemia: Secondary | ICD-10-CM

## 2014-10-13 DIAGNOSIS — E871 Hypo-osmolality and hyponatremia: Secondary | ICD-10-CM

## 2014-10-13 DIAGNOSIS — L03115 Cellulitis of right lower limb: Secondary | ICD-10-CM

## 2014-10-13 DIAGNOSIS — R609 Edema, unspecified: Secondary | ICD-10-CM

## 2014-10-13 DIAGNOSIS — I10 Essential (primary) hypertension: Secondary | ICD-10-CM

## 2014-10-13 DIAGNOSIS — E119 Type 2 diabetes mellitus without complications: Secondary | ICD-10-CM

## 2014-10-13 LAB — BASIC METABOLIC PANEL
BUN: 13 mg/dL (ref 6–23)
CHLORIDE: 98 meq/L (ref 96–112)
CO2: 24 meq/L (ref 19–32)
CREATININE: 0.67 mg/dL (ref 0.50–1.10)
Calcium: 8.9 mg/dL (ref 8.4–10.5)
GLUCOSE: 90 mg/dL (ref 70–99)
POTASSIUM: 4.2 meq/L (ref 3.5–5.3)
Sodium: 135 mEq/L (ref 135–145)

## 2014-10-13 NOTE — Assessment & Plan Note (Signed)
Well controlled 

## 2014-10-13 NOTE — Progress Notes (Signed)
Patient ID: Kristin Logan, female   DOB: Sep 19, 1932, 81 y.o.   MRN: [id number]   Subjective:    Patient ID: Kristin Logan, female    DOB: Jun 30, 1932, 81 y.o.   MRN: [id number]  Patient presents for Rehab F/U  patient here for hospital follow-up. She was admitted to the hospital secondary to a vasovagal syncopal event as well as cellulitis of her leg should actually have worsening cellulitis and swelling which took her to the emergency room and while she was in the ER she had the syncopal event. Her CT scan of head was negative and her cardiac workup was negative. She was treated with Rocephin and vancomycin for the cellulitis which quickly resolved she was also treated for Escherichia coli urinary infection. She was sent to the [facility] for rehabilitation and she was discharged last Wednesday.  Bronchitis:  I will note on day of discharge she had cough which was productive or worsening which they're initially treating a viral ilnesswent ahead and put her on amoxicillin twice a day she still has a few days left of that I did see the chest x-ray from last Wednesday there was no infiltrate noted. Continues to have some cough and wheeze.   She's also due for repeat metabolic panel secondary to some mild hypokalemia and to recheck her sodium levels. Her son and her daughter-in-law with her today to request new diabetic shoes as well as a referral back to her orthopedist to have knee injections done.  She is currently getting physical therapy and occupational therapy she still has her aide during the day time.    Review Of Systems:  GEN- denies fatigue, fever, weight loss,weakness, recent illness HEENT- denies eye drainage, change in vision, nasal discharge, CVS- denies chest pain, palpitations RESP- denies SOB, +cough, +wheeze ABD- denies N/V, change in stools, abd pain GU- denies dysuria, hematuria, dribbling, incontinence MSK- + joint pain, muscle aches, injury Neuro- denies headache,  dizziness, syncope, seizure activity       Objective:    BP 128/74 mmHg  Pulse 76  Temp(Src) 98.4 F (36.9 C) (Oral)  Resp 16  Ht 5\' 5"  (1.651 m)  Wt 217 lb (98.431 kg)  BMI 36.11 kg/m2 GEN- NAD, alert and oriented x 3 ,sitting in wheelchair, well appearing HEENT-PEERL, EOMI , oropharynx clear, MMM,  TM clear bilat ,  sclera clear CVS- irregular rhythem, normal rate  2/6 SEM RESP-, few scatterd wheeze, wet cough, no rales, no rhonchi Ext-trace pedal edema  Skin-  0.5 cm scab left lower leg previous skin tear, mild erythema to bilat LE, no warmth, multiple areas of ecchymosis on legs MSK- Decreased ROM bilat knees, no effusion Pulse- Radial 2+,DP diminished bilat      Assessment & Plan:      Problem List Items Addressed This Visit      Unprioritized   Peripheral edema    Continue Lasix 10 mg twice a day will also reorder her compression hose at 20-30 mg of mercury    Osteoarthritis of both knees    Referral back to Dr. Luna Glasgow for knee injections at family request    Relevant Orders      Ambulatory referral to Orthopedic Surgery   Hyponatremia - Primary   Relevant Orders      Basic metabolic panel   Hypokalemia    Recheck potasium    Gait instability    Continue with PT    Essential hypertension    Well controlled  Diabetic neuropathy   Diabetes mellitus type II, controlled    Diet controlled, A1C looks good    Cellulitis of leg, right    Now resolved     Other Visit Diagnoses    Acute bronchitis, unspecified organism        Complete antibiotics, use neb TID for next few days, cough medicine.        Note: This dictation was prepared with Dragon dictation along with smaller phrase technology. Any transcriptional errors that result from this process are unintentional.

## 2014-10-13 NOTE — Assessment & Plan Note (Signed)
Referral back to Dr. Luna Glasgow for knee injections at family request

## 2014-10-13 NOTE — Assessment & Plan Note (Signed)
Continue with PT

## 2014-10-13 NOTE — Assessment & Plan Note (Signed)
Recheck potasium

## 2014-10-13 NOTE — Patient Instructions (Signed)
Continue antibiotics Mucinex DM for cough  We will call with potassium labs  Diabetic shoes  Use nebulizer three times a day for the next 3-4 days  Dr. Luna Glasgow for knee injections  F/u as previous

## 2014-10-13 NOTE — Assessment & Plan Note (Signed)
Diet controlled, A1C looks good

## 2014-10-13 NOTE — Assessment & Plan Note (Signed)
Now resolved.  

## 2014-10-13 NOTE — Assessment & Plan Note (Signed)
Continue Lasix 10 mg twice a day will also reorder her compression hose at 20-30 mg of mercury

## 2014-10-16 ENCOUNTER — Telehealth: Payer: Self-pay | Admitting: Family Medicine

## 2014-10-16 ENCOUNTER — Other Ambulatory Visit: Payer: Self-pay | Admitting: *Deleted

## 2014-10-16 MED ORDER — BLOOD GLUCOSE METER KIT: PACK | Status: DC

## 2014-10-16 NOTE — Telephone Encounter (Signed)
Call returned to [facility].   Was advised that MD notes do not mention callus on foot. States that if callus is not present, then patient does not qualify for diabetic shoes.   MD to be made aware.

## 2014-10-16 NOTE — Telephone Encounter (Signed)
Pt qualifies I did not write in the comment section, she already has DM has had for years, we will send over addendum

## 2014-10-16 NOTE — Telephone Encounter (Signed)
Notes faxed to [facility].

## 2014-10-16 NOTE — Telephone Encounter (Signed)
Sharee Pimple from [facility] calling to ask some questions about rx that was sent over for this patient in order to qualify for her diabetic shoes  [phone]

## 2014-10-16 NOTE — Telephone Encounter (Signed)
Received call from patient.   Reports that she requires new glucometer.   Prescription sent to pharmacy.

## 2014-10-16 NOTE — Progress Notes (Addendum)
Addendum to foot exam- I failed to comment on inspection  Bilateral callus/pre-ulcerative,  Skin skin  thick toenails

## 2014-10-26 DIAGNOSIS — L97221 Non-pressure chronic ulcer of left calf limited to breakdown of skin: Secondary | ICD-10-CM

## 2014-10-26 DIAGNOSIS — E119 Type 2 diabetes mellitus without complications: Secondary | ICD-10-CM

## 2014-10-26 DIAGNOSIS — M179 Osteoarthritis of knee, unspecified: Secondary | ICD-10-CM

## 2014-10-26 DIAGNOSIS — I82401 Acute embolism and thrombosis of unspecified deep veins of right lower extremity: Secondary | ICD-10-CM

## 2014-10-28 ENCOUNTER — Telehealth: Payer: Self-pay | Admitting: Family Medicine

## 2014-10-28 NOTE — Telephone Encounter (Signed)
[facility]ristin [facility] from [facility] calling to ask some questions about patients a1c, she left a message about this  Please call her back at [phone]

## 2014-10-28 NOTE — Telephone Encounter (Signed)
Call placed to [facility].  States that Medicare requires HgbA1C q 3 months.   Advised that last HgbA1C was drawn on 08/10/2014, and was noted at 6.2%.

## 2014-11-03 NOTE — Progress Notes (Signed)
Patient ID: Kristin Logan, female   DOB: [birth year], 81 y.o.   MRN: [id number]

## 2014-11-19 ENCOUNTER — Encounter (HOSPITAL_COMMUNITY): Payer: Self-pay

## 2014-11-19 ENCOUNTER — Observation Stay (HOSPITAL_COMMUNITY)
Admission: EM | Admit: 2014-11-19 | Discharge: 2014-11-20 | Disposition: A | Discharge status: Home / Self Care | Payer: Medicare Other | Attending: Internal Medicine | Admitting: Internal Medicine

## 2014-11-19 ENCOUNTER — Emergency Department (HOSPITAL_COMMUNITY): Payer: Medicare Other

## 2014-11-19 DIAGNOSIS — K3184 Gastroparesis: Secondary | ICD-10-CM | POA: Insufficient documentation

## 2014-11-19 DIAGNOSIS — Z86018 Personal history of other benign neoplasm: Secondary | ICD-10-CM | POA: Diagnosis not present

## 2014-11-19 DIAGNOSIS — I10 Essential (primary) hypertension: Secondary | ICD-10-CM | POA: Diagnosis not present

## 2014-11-19 DIAGNOSIS — G8929 Other chronic pain: Secondary | ICD-10-CM | POA: Insufficient documentation

## 2014-11-19 DIAGNOSIS — Z872 Personal history of diseases of the skin and subcutaneous tissue: Secondary | ICD-10-CM | POA: Diagnosis not present

## 2014-11-19 DIAGNOSIS — Z79899 Other long term (current) drug therapy: Secondary | ICD-10-CM | POA: Insufficient documentation

## 2014-11-19 DIAGNOSIS — R0782 Intercostal pain: Secondary | ICD-10-CM | POA: Diagnosis not present

## 2014-11-19 DIAGNOSIS — Z8673 Personal history of transient ischemic attack (TIA), and cerebral infarction without residual deficits: Secondary | ICD-10-CM | POA: Diagnosis not present

## 2014-11-19 DIAGNOSIS — M179 Osteoarthritis of knee, unspecified: Secondary | ICD-10-CM | POA: Diagnosis not present

## 2014-11-19 DIAGNOSIS — R079 Chest pain, unspecified: Principal | ICD-10-CM | POA: Insufficient documentation

## 2014-11-19 DIAGNOSIS — K5790 Diverticulosis of intestine, part unspecified, without perforation or abscess without bleeding: Secondary | ICD-10-CM | POA: Insufficient documentation

## 2014-11-19 DIAGNOSIS — G4730 Sleep apnea, unspecified: Secondary | ICD-10-CM | POA: Insufficient documentation

## 2014-11-19 DIAGNOSIS — R296 Repeated falls: Secondary | ICD-10-CM

## 2014-11-19 DIAGNOSIS — K429 Umbilical hernia without obstruction or gangrene: Secondary | ICD-10-CM | POA: Insufficient documentation

## 2014-11-19 DIAGNOSIS — I4891 Unspecified atrial fibrillation: Secondary | ICD-10-CM | POA: Diagnosis not present

## 2014-11-19 DIAGNOSIS — Z7982 Long term (current) use of aspirin: Secondary | ICD-10-CM | POA: Diagnosis not present

## 2014-11-19 DIAGNOSIS — Z87828 Personal history of other (healed) physical injury and trauma: Secondary | ICD-10-CM | POA: Diagnosis not present

## 2014-11-19 DIAGNOSIS — K219 Gastro-esophageal reflux disease without esophagitis: Secondary | ICD-10-CM | POA: Insufficient documentation

## 2014-11-19 DIAGNOSIS — I482 Chronic atrial fibrillation, unspecified: Secondary | ICD-10-CM | POA: Diagnosis present

## 2014-11-19 DIAGNOSIS — Z9981 Dependence on supplemental oxygen: Secondary | ICD-10-CM | POA: Insufficient documentation

## 2014-11-19 DIAGNOSIS — I359 Nonrheumatic aortic valve disorder, unspecified: Secondary | ICD-10-CM | POA: Diagnosis not present

## 2014-11-19 DIAGNOSIS — M7120 Synovial cyst of popliteal space [Baker], unspecified knee: Secondary | ICD-10-CM | POA: Insufficient documentation

## 2014-11-19 DIAGNOSIS — M5136 Other intervertebral disc degeneration, lumbar region: Secondary | ICD-10-CM | POA: Diagnosis not present

## 2014-11-19 DIAGNOSIS — K8020 Calculus of gallbladder without cholecystitis without obstruction: Secondary | ICD-10-CM | POA: Insufficient documentation

## 2014-11-19 DIAGNOSIS — Z853 Personal history of malignant neoplasm of breast: Secondary | ICD-10-CM | POA: Diagnosis not present

## 2014-11-19 DIAGNOSIS — G3184 Mild cognitive impairment, so stated: Secondary | ICD-10-CM | POA: Diagnosis present

## 2014-11-19 DIAGNOSIS — E2740 Unspecified adrenocortical insufficiency: Secondary | ICD-10-CM | POA: Insufficient documentation

## 2014-11-19 DIAGNOSIS — E785 Hyperlipidemia, unspecified: Secondary | ICD-10-CM | POA: Insufficient documentation

## 2014-11-19 DIAGNOSIS — H269 Unspecified cataract: Secondary | ICD-10-CM | POA: Insufficient documentation

## 2014-11-19 DIAGNOSIS — E119 Type 2 diabetes mellitus without complications: Secondary | ICD-10-CM

## 2014-11-19 DIAGNOSIS — Z7901 Long term (current) use of anticoagulants: Secondary | ICD-10-CM | POA: Diagnosis not present

## 2014-11-19 LAB — BASIC METABOLIC PANEL
Anion gap: 5 (ref 5–15)
BUN: 7 mg/dL (ref 6–23)
CO2: 29 mmol/L (ref 19–32)
CREATININE: 0.69 mg/dL (ref 0.50–1.10)
Calcium: 8.3 mg/dL — ABNORMAL LOW (ref 8.4–10.5)
Chloride: 107 mmol/L (ref 96–112)
GFR, EST NON AFRICAN AMERICAN: 79 mL/min — AB (ref >90)
Glucose, Bld: 166 mg/dL — ABNORMAL HIGH (ref 70–99)
Potassium: 4.5 mmol/L (ref 3.5–5.1)
Sodium: 141 mmol/L (ref 135–145)

## 2014-11-19 LAB — TROPONIN I
Troponin I: <0.03 ng/mL (ref <0.031)
Troponin I: <0.03 ng/mL (ref <0.031)
Troponin I: <0.03 ng/mL (ref <0.031)

## 2014-11-19 LAB — GLUCOSE, CAPILLARY: GLUCOSE-CAPILLARY: 199 mg/dL — AB (ref 70–99)

## 2014-11-19 LAB — BRAIN NATRIURETIC PEPTIDE: B NATRIURETIC PEPTIDE 5: 165 pg/mL — AB (ref 0.0–100.0)

## 2014-11-19 LAB — CBC WITH DIFFERENTIAL/PLATELET
Basophils Absolute: 0.1 10*3/uL (ref 0.0–0.1)
Basophils Relative: 1 % (ref 0–1)
Eosinophils Absolute: 1 10*3/uL — ABNORMAL HIGH (ref 0.0–0.7)
Eosinophils Relative: 8 % — ABNORMAL HIGH (ref 0–5)
HCT: 40.4 % (ref 36.0–46.0)
HEMOGLOBIN: 12.9 g/dL (ref 12.0–15.0)
Lymphocytes Relative: 19 % (ref 12–46)
Lymphs Abs: 2.4 10*3/uL (ref 0.7–4.0)
MCH: 29.7 pg (ref 26.0–34.0)
MCHC: 31.9 g/dL (ref 30.0–36.0)
MCV: 93.1 fL (ref 78.0–100.0)
MONO ABS: 0.8 10*3/uL (ref 0.1–1.0)
MONOS PCT: 6 % (ref 3–12)
NEUTROS ABS: 8.3 10*3/uL — AB (ref 1.7–7.7)
Neutrophils Relative %: 66 % (ref 43–77)
Platelets: 251 10*3/uL (ref 150–400)
RBC: 4.34 MIL/uL (ref 3.87–5.11)
RDW: 14.3 % (ref 11.5–15.5)
WBC: 12.4 10*3/uL — ABNORMAL HIGH (ref 4.0–10.5)

## 2014-11-19 LAB — D-DIMER, QUANTITATIVE (NOT AT ARMC): D-Dimer, Quant: 1.04 ug/mL-FEU — ABNORMAL HIGH (ref 0.00–0.48)

## 2014-11-19 MED ORDER — ALBUTEROL SULFATE (2.5 MG/3ML) 0.083% IN NEBU: 2.5000 mg | INHALATION_SOLUTION | Freq: Four times a day (QID) | RESPIRATORY_TRACT | Status: DC | PRN

## 2014-11-19 MED ORDER — ASPIRIN 81 MG PO CHEW
324.0000 mg | CHEWABLE_TABLET | Freq: Once | ORAL | Status: AC
  Administered 2014-11-19: 324 mg via ORAL
  Filled 2014-11-19: qty 4

## 2014-11-19 MED ORDER — HEPARIN SODIUM (PORCINE) 5000 UNIT/ML IJ SOLN
5000.0000 [IU] | Freq: Three times a day (TID) | INTRAMUSCULAR | Status: DC
  Administered 2014-11-19 – 2014-11-20 (×2): 5000 [IU] via SUBCUTANEOUS
  Filled 2014-11-19 (×3): qty 1

## 2014-11-19 MED ORDER — ASPIRIN EC 81 MG PO TBEC
81.0000 mg | DELAYED_RELEASE_TABLET | Freq: Every day | ORAL | Status: DC
  Administered 2014-11-20: 81 mg via ORAL
  Filled 2014-11-19: qty 1

## 2014-11-19 MED ORDER — POLYETHYLENE GLYCOL 3350 17 GM/SCOOP PO POWD: 17.0000 g | Freq: Every day | ORAL | Status: DC

## 2014-11-19 MED ORDER — PREDNISONE 10 MG PO TABS
10.0000 mg | ORAL_TABLET | Freq: Every day | ORAL | Status: DC
  Administered 2014-11-20: 10 mg via ORAL
  Filled 2014-11-19: qty 1

## 2014-11-19 MED ORDER — SIMVASTATIN 20 MG PO TABS
20.0000 mg | ORAL_TABLET | Freq: Every day | ORAL | Status: DC
  Administered 2014-11-19: 20 mg via ORAL
  Filled 2014-11-19: qty 1

## 2014-11-19 MED ORDER — ACETAMINOPHEN 325 MG PO TABS
650.0000 mg | ORAL_TABLET | ORAL | Status: DC | PRN
  Administered 2014-11-20: 650 mg via ORAL
  Filled 2014-11-19: qty 2

## 2014-11-19 MED ORDER — ACETAMINOPHEN ER 650 MG PO TBCR: 650.0000 mg | EXTENDED_RELEASE_TABLET | Freq: Three times a day (TID) | ORAL | Status: DC | PRN

## 2014-11-19 MED ORDER — FUROSEMIDE 20 MG PO TABS
10.0000 mg | ORAL_TABLET | Freq: Two times a day (BID) | ORAL | Status: DC
  Administered 2014-11-20: 10 mg via ORAL
  Filled 2014-11-19: qty 1

## 2014-11-19 MED ORDER — PANTOPRAZOLE SODIUM 40 MG PO TBEC
40.0000 mg | DELAYED_RELEASE_TABLET | Freq: Every day | ORAL | Status: DC
  Administered 2014-11-20: 40 mg via ORAL
  Filled 2014-11-19: qty 1

## 2014-11-19 MED ORDER — ACETAMINOPHEN 500 MG PO TABS
1000.0000 mg | ORAL_TABLET | Freq: Once | ORAL | Status: AC
  Administered 2014-11-19: 1000 mg via ORAL
  Filled 2014-11-19: qty 2

## 2014-11-19 MED ORDER — LEVOTHYROXINE SODIUM 75 MCG PO TABS
75.0000 ug | ORAL_TABLET | Freq: Every day | ORAL | Status: DC
  Administered 2014-11-20: 75 ug via ORAL
  Filled 2014-11-19: qty 1

## 2014-11-19 MED ORDER — ONDANSETRON HCL 4 MG/2ML IJ SOLN: 4.0000 mg | Freq: Four times a day (QID) | INTRAMUSCULAR | Status: DC | PRN

## 2014-11-19 MED ORDER — NYSTATIN 100000 UNIT/GM EX POWD
1.0000 g | Freq: Three times a day (TID) | CUTANEOUS | Status: DC | PRN
  Filled 2014-11-19: qty 15

## 2014-11-19 MED ORDER — POTASSIUM CHLORIDE CRYS ER 10 MEQ PO TBCR
10.0000 meq | EXTENDED_RELEASE_TABLET | Freq: Every day | ORAL | Status: DC
  Administered 2014-11-20: 10 meq via ORAL
  Filled 2014-11-19 (×2): qty 1

## 2014-11-19 MED ORDER — IOHEXOL 350 MG/ML SOLN
100.0000 mL | Freq: Once | INTRAVENOUS | Status: AC | PRN
  Administered 2014-11-19: 100 mL via INTRAVENOUS

## 2014-11-19 MED ORDER — GABAPENTIN 300 MG PO CAPS
300.0000 mg | ORAL_CAPSULE | Freq: Two times a day (BID) | ORAL | Status: DC
  Administered 2014-11-19 – 2014-11-20 (×2): 300 mg via ORAL
  Filled 2014-11-19 (×2): qty 1

## 2014-11-19 MED ORDER — INSULIN ASPART 100 UNIT/ML ~~LOC~~ SOLN: 0.0000 [IU] | Freq: Every day | SUBCUTANEOUS | Status: DC

## 2014-11-19 MED ORDER — DICLOFENAC SODIUM 1 % TD GEL
2.0000 g | Freq: Four times a day (QID) | TRANSDERMAL | Status: DC | PRN
  Filled 2014-11-19: qty 100

## 2014-11-19 MED ORDER — HEPARIN SODIUM (PORCINE) 5000 UNIT/ML IJ SOLN: 5000.0000 [IU] | Freq: Three times a day (TID) | INTRAMUSCULAR | Status: DC

## 2014-11-19 MED ORDER — POLYETHYLENE GLYCOL 3350 17 G PO PACK
17.0000 g | PACK | Freq: Every day | ORAL | Status: DC
  Filled 2014-11-19: qty 1

## 2014-11-19 MED ORDER — INSULIN ASPART 100 UNIT/ML ~~LOC~~ SOLN
0.0000 [IU] | Freq: Three times a day (TID) | SUBCUTANEOUS | Status: DC
Start: 2014-11-19 — End: 2014-11-20

## 2014-11-19 NOTE — H&P (Signed)
[name] History and Physical  Kristin Logan ION:[id number] DOB: 1932-06-08 DOA: 11/19/2014  Referring physician: ER PCP: Vic Blackbird, MD   Chief Complaint: Left-sided chest pain  HPI: Kristin Logan is a 81 y.o. female  This is an 81 year old lady who presents with a 24-48 hour history of left-sided chest pain which is pleuritic in nature. She denies any trauma, heavy lifting although, according to the daughter-in-law who is at the bedside, the patient does apparently have some memory problems. The patient denies any dyspnea with the pain. There is no cough, no fevers, no leg pain or swelling. The pain does not radiate anywhere else. The patient has a history of chronic atrial fibrillation and previous history of aortic valve replacement. She is not a candidate for anticoagulation because of previous intracerebral bleeds and a history of falls. She is hypertensive, diabetic, has a history of hyperlipidemia. She is now being admitted for further evaluation of the chest pain.   Review of Systems:  Apart from the symptoms above, all systems negative.  Past Medical History  Diagnosis Date  . Diabetes mellitus   . Hyperlipidemia   . Eosinophilia   . Cataract   . Hypertension   . Atrial fibrillation prior to 2008    moderate biatrial enlargement in 2009; mild to moderate LVH with a low-normal EF  . GERD (gastroesophageal reflux disease)     Hemorrhoids; history of peptic ulcer disease  . DJD (degenerative joint disease) of knee   . Chronic anticoagulation   . Aortic valve disease     2009: mild stenosis and regurgitation; peak gradient of 30-35 mmHg , subsequent AVR at [facility]  . Incontinence     Mild  . Hip fracture, left 12/12/2012  . Foot injury 06/25/2012  . Degenerative joint disease of knee 01/29/2008    Bilateral    . BREAST CANCER, HX OF 01/29/2008    Lumpectomy, radiation therapy  Arimidex stopped in July 2013    . Syncope and collapse 11/11/2012  .  Diverticulosis   . Umbilical hernia   . Cholelithiasis   . DDD (degenerative disc disease), lumbar   . Vertebral compression fracture     lumbar  . Chronic pain   . Paresthesias     feet  . Gastroparesis   . Sleep apnea     CPAP  . Carcinoma of breast     Right mastectomy; radiation and hormonal therapy  . Stroke 09/2013    Hemorrhagic stroke while on coumadin  . Adrenal insufficiency 08/23/2013    On chronic prednisone  . Chronic a-fib   . Cyst, Baker's knee 09/12/2014  . Syncope 09/11/2014  . Cellulitis of leg, right 09/11/2014   Past Surgical History  Procedure Laterality Date  . Exploration post operative open heart    . Colonoscopy      Approximately 2000  . Cardiac valve replacement      [facility]  . Cardiac valve replacement    . Breast surgery    . Breast lumpectomy    . Esophagogastroduodenoscopy N/A 02/20/2013    Procedure: ESOPHAGOGASTRODUODENOSCOPY (EGD);  Surgeon: Rogene Houston, MD;  Location: [facility];  Service: Endoscopy;  Laterality: N/A;  . Mastectomy     Social History:  reports that she has never smoked. She has never used smokeless tobacco. She reports that she does not drink alcohol or use illicit drugs.  Allergies  Allergen Reactions  . Coumadin [Warfarin Sodium] Other (See Comments)    Patient had  ICH  . Nsaids Other (See Comments)    Bleeding ulcer  . Codeine Nausea And Vomiting  . Adhesive [Tape] Rash    Redness, and peeling skin off.     Family History  Problem Relation Age of Onset  . Heart disease Mother   . Stroke Father   . Cancer Sister   . Heart disease Sister   . Stroke Sister   . Stroke Brother       Prior to Admission medications   Medication Sig Start Date End Date Taking? Authorizing Provider  acetaminophen (TYLENOL) 650 MG CR tablet Take 1,300 mg by mouth every 8 (eight) hours as needed for pain. 01/21/13  Yes Samuella Cota, MD  albuterol (PROVENTIL) (2.5 MG/3ML) 0.083% nebulizer solution Take 2.5 mg by  nebulization every 6 (six) hours as needed for wheezing or shortness of breath.   Yes Historical Provider, MD  aspirin EC 81 MG tablet Take 1 tablet (81 mg total) by mouth daily. 09/01/13  Yes Abelina Bachelor, PA-C  blood glucose meter kit and supplies Dispense based on patient and insurance preference. Use to monitor FSBS 1x daily. Dx: E11.9 10/16/14  Yes Alycia Rossetti, MD  diclofenac sodium (VOLTAREN) 1 % GEL Apply 2 g topically 4 (four) times daily as needed (arthritis pain to knees).   Yes Historical Provider, MD  esomeprazole (NEXIUM) 20 MG capsule Take 20 mg by mouth daily at 12 noon.   Yes Historical Provider, MD  furosemide (LASIX) 20 MG tablet Take 0.5 tablets (10 mg total) by mouth 2 (two) times daily. 09/15/14  Yes Lezlie Octave Black, NP  gabapentin (NEURONTIN) 300 MG capsule Take 1 capsule (300 mg total) by mouth 2 (two) times daily. 03/11/14  Yes Alycia Rossetti, MD  levothyroxine (SYNTHROID, LEVOTHROID) 75 MCG tablet Take 75 mcg by mouth daily before breakfast. 08/25/13  Yes Radene Gunning, NP  nystatin (MYCOSTATIN/NYSTOP) 100000 UNIT/GM POWD Apply to affected areas three times a day Patient taking differently: Apply 1 g topically 3 (three) times daily as needed (rash). Apply to affected areas three times a day 12/09/13  Yes Alycia Rossetti, MD  polyethylene glycol powder (GLYCOLAX/MIRALAX) powder Take 17 g by mouth daily. 06/09/14  Yes Alycia Rossetti, MD  potassium chloride (K-DUR) 10 MEQ tablet TAKE 1 TABLET BY MOUTH ONCE DAILY. 08/18/14  Yes Alycia Rossetti, MD  predniSONE (DELTASONE) 5 MG tablet Take 10 mg by mouth daily with breakfast. 10/01/13  Yes Alycia Rossetti, MD  simvastatin (ZOCOR) 20 MG tablet Take 1 tablet (20 mg total) by mouth at bedtime. 01/28/13  Yes Alycia Rossetti, MD  clotrimazole-betamethasone (LOTRISONE) cream Apply 1 application topically 2 (two) times daily. Patient not taking: Reported on 10/13/2014 10/18/13   Alycia Rossetti, MD  metoprolol succinate (TOPROL XL) 25  MG 24 hr tablet Take 1 tablet (25 mg total) by mouth daily. 09/15/14   Radene Gunning, NP  omeprazole (PRILOSEC) 20 MG capsule TAKE (1) CAPSULE BY MOUTH TWICE DAILY FOR HEARTBURN. Patient not taking: Reported on 11/19/2014 09/04/14   Alycia Rossetti, MD  senna-docusate (SENOKOT-S) 8.6-50 MG per tablet Take 1 tablet by mouth 2 (two) times daily. Patient not taking: Reported on 11/19/2014 09/15/14   Radene Gunning, NP   Physical Exam: Filed Vitals:   11/19/14 1153 11/19/14 1649  BP: 161/76 186/92  Pulse: 107 86  Temp: 98.5 F (36.9 C) 97.6 F (36.4 C)  TempSrc: Oral Oral  Resp: 20 20  Height: 5\' 5"  (1.651 m)   Weight: 102.513 kg (226 lb)   SpO2: 95% 100%    Wt Readings from Last 3 Encounters:  11/19/14 102.513 kg (226 lb)  10/13/14 98.431 kg (217 lb)  09/15/14 100.381 kg (221 lb 4.8 oz)    General:  Appears calm and comfortable Eyes: PERRL, normal lids, irises & conjunctiva ENT: grossly normal hearing, lips & tongue Neck: no LAD, masses or thyromegaly Cardiovascular: Irregularly irregular, consistent with atrial fibrillation, rate control. Telemetry: Atrial fibrillation  Respiratory: CTA bilaterally, no w/r/r. Normal respiratory effort. Abdomen: soft, ntnd Skin: no rash or induration seen on limited exam Musculoskeletal: Acutely tender in the left lateral chest, reproducing the pain. Psychiatric: grossly normal mood and affect, speech fluent and appropriate Neurologic: grossly non-focal.          Labs on Admission:  Basic Metabolic Panel:  Recent Labs Lab 11/19/14 1410  NA 141  K 4.5  CL 107  CO2 29  GLUCOSE 166*  BUN 7  CREATININE 0.69  CALCIUM 8.3*   Liver Function Tests: No results for input(s): AST, ALT, ALKPHOS, BILITOT, PROT, ALBUMIN in the last 168 hours. No results for input(s): LIPASE, AMYLASE in the last 168 hours. No results for input(s): AMMONIA in the last 168 hours. CBC:  Recent Labs Lab 11/19/14 1410  WBC 12.4*  NEUTROABS 8.3*  HGB 12.9    HCT 40.4  MCV 93.1  PLT 251   Cardiac Enzymes:  Recent Labs Lab 11/19/14 1410  TROPONINI <0.03    BNP (last 3 results)  Recent Labs  11/19/14 1350  BNP 165.0*    ProBNP (last 3 results)  Recent Labs  12/22/13 1010  PROBNP 820.0*    CBG: No results for input(s): GLUCAP in the last 168 hours.  Radiological Exams on Admission: Dg Ribs Unilateral W/chest Left  11/19/2014   CLINICAL DATA:  Several day history of left anterior lower rib pain. No known injury  EXAM: LEFT RIBS AND CHEST - 3+ VIEW  COMPARISON:  Chest radiograph October 07, 2014  FINDINGS: Frontal chest as well as oblique and cone-down lower rib images were obtained. There is no edema or consolidation. There is mild scarring in the right lower lobe. Heart is mildly enlarged with pulmonary vascularity within normal limits. Patient is status post median sternotomy. There is atherosclerotic change throughout the aorta. There are surgical clips in the right axilla.  Bones are osteoporotic. No rib fracture apparent. No pneumothorax or effusion.  IMPRESSION: No rib fracture apparent. No edema or consolidation. Heart mildly enlarged but stable. Bones are osteoporotic.   Electronically Signed   By: October 09, 2014 III M.D.   On: 11/19/2014 13:05   Ct Angio Chest Pe W/cm &/or Wo Cm  11/19/2014   CLINICAL DATA:  Pt reports pain in left rib that started yesterday. Says is worse with movement and deep breathing. Denies injury. Denies SOb but says that Dr. 11/21/2014 sent here here for evaluation of possible fluid on lungs. Denies cough/congestion.  EXAM: CT ANGIOGRAPHY CHEST WITH CONTRAST  TECHNIQUE: Multidetector CT imaging of the chest was performed using the standard protocol during bolus administration of intravenous contrast. Multiplanar CT image reconstructions and MIPs were obtained to evaluate the vascular anatomy.  CONTRAST:  Fransico Him OMNIPAQUE IOHEXOL 350 MG/ML SOLN  COMPARISON:  Chest and ribs obtained this same date. Chest CT,  08/23/2013.  FINDINGS: Mediastinum: Moderate enlargement of the heart. Mild coronary artery calcifications. There are changes from an aortic valve replacement. Aorta is normal  in caliber. Pulmonary arteries are normal in caliber. No pulmonary embolus noted. No mediastinal or hilar masses or adenopathy.  Lungs/pleura: Linear and reticular areas of lung opacity most evident in the right middle lobe, most consistent with atelectasis. No lung consolidation or edema. No suspicious lung mass or nodule. No pleural effusion or pneumothorax.  Limited upper abdomen:  Unremarkable.  Musculoskeletal: Mild depression of the upper endplate of T3 with underlying sclerosis. There is also mild depression of the central upper endplate of T4. These findings are new from a prior chest CT. Mild loss of height of T6, stable. Moderate compression fracture of T12, also stable. No other fractures. No osteoblastic or osteolytic lesions.  Review of the MIP images confirms the above findings.  IMPRESSION: 1. No acute cardiopulmonary disease. No evidence of pulmonary edema. No pleural effusions. 2. No evidence of pneumonia. 3. Moderate cardiomegaly. Mild subsegmental atelectasis in the lower lungs. 4. Mild depression of the upper endplate of T3 as well as the central upper endplate of T4. This is new from the prior CT. These fractures could be recent or chronic. No other evidence of a recent or acute abnormality.   Electronically Signed   By: Lajean Manes M.D.   On: 11/19/2014 16:40    EKG: Independently reviewed. Atrial fibrillation without any acute ST-T wave changes.  Assessment/Plan   1. Left-sided chest pain. This appears to be musculoskeletal in nature. However she does have cardiac risk factors and we will cycle cardiac enzymes overnight. CT angiogram of the chest does not show any evidence of pulmonary embolism. Treat pain with acetaminophen for the time being, I'm hesitant to use opioids at this stage. 2. Chronic atrial  fibrillation, rate is controlled. 3. Diabetes. Sliding scale of insulin. 4. Hypertension.  Further recommendations will depend on patient's hospital progress.   Code Status: Full code.   DVT Prophylaxis: Heparin.   Family Communication: I discussed the plan with the patient and patient's daughter-in-law at the bedside.   Disposition Plan: Home when medically stable.  Time spent: 60 minutes.  Doree Albee [name] Pager [phone].

## 2014-11-19 NOTE — ED Provider Notes (Signed)
CSN: [id number]     Arrival date & time 11/19/14  1133 History   First MD Initiated Contact with Patient 11/19/14 1311     Chief Complaint  Patient presents with  . rib pain      (Consider location/radiation/quality/duration/timing/severity/associated sxs/prior Treatment) HPI 81 year old female with left-sided lower chest pain for the past 24 hours. It seemed to start yesterday afternoon and has been ongoing. It does seem to get better and worse due to certain movements including inspiration and any type of twisting or sitting up. Patient denies any shortness of breath. No cough. No fevers. Patient does not have any leg swelling or leg pain.  Past Medical History  Diagnosis Date  . Diabetes mellitus   . Hyperlipidemia   . Eosinophilia   . Cataract   . Hypertension   . Atrial fibrillation prior to 2008    moderate biatrial enlargement in 2009; mild to moderate LVH with a low-normal EF  . GERD (gastroesophageal reflux disease)     Hemorrhoids; history of peptic ulcer disease  . DJD (degenerative joint disease) of knee   . Chronic anticoagulation   . Aortic valve disease     2009: mild stenosis and regurgitation; peak gradient of 30-35 mmHg , subsequent AVR at [facility]  . Incontinence     Mild  . Hip fracture, left 12/12/2012  . Foot injury 06/25/2012  . Degenerative joint disease of knee 01/29/2008    Bilateral    . BREAST CANCER, HX OF 01/29/2008    Lumpectomy, radiation therapy  Arimidex stopped in July 2013    . Syncope and collapse 11/11/2012  . Diverticulosis   . Umbilical hernia   . Cholelithiasis   . DDD (degenerative disc disease), lumbar   . Vertebral compression fracture     lumbar  . Chronic pain   . Paresthesias     feet  . Gastroparesis   . Sleep apnea     CPAP  . Carcinoma of breast     Right mastectomy; radiation and hormonal therapy  . Stroke 09/2013    Hemorrhagic stroke while on coumadin  . Adrenal insufficiency 08/23/2013    On chronic prednisone  .  Chronic a-fib   . Cyst, Baker's knee 09/12/2014  . Syncope 09/11/2014  . Cellulitis of leg, right 09/11/2014   Past Surgical History  Procedure Laterality Date  . Exploration post operative open heart    . Colonoscopy      Approximately 2000  . Cardiac valve replacement      [facility]  . Cardiac valve replacement    . Breast surgery    . Breast lumpectomy    . Esophagogastroduodenoscopy N/A 02/20/2013    Procedure: ESOPHAGOGASTRODUODENOSCOPY (EGD);  Surgeon: Rogene Houston, MD;  Location: [facility];  Service: Endoscopy;  Laterality: N/A;  . Mastectomy     Family History  Problem Relation Age of Onset  . Heart disease Mother   . Stroke Father   . Cancer Sister   . Heart disease Sister   . Stroke Sister   . Stroke Brother    History  Substance Use Topics  . Smoking status: Never Smoker   . Smokeless tobacco: Never Used  . Alcohol Use: No   OB History    No data available     Review of Systems  Constitutional: Negative for fever.  Respiratory: Negative for cough and shortness of breath.   Cardiovascular: Positive for chest pain. Negative for leg swelling.  Gastrointestinal: Negative for vomiting  and abdominal pain.  Skin: Negative for rash.  All other systems reviewed and are negative.     Allergies  Coumadin; Nsaids; Codeine; and Adhesive  Home Medications   Prior to Admission medications   Medication Sig Start Date End Date Taking? Authorizing Provider  acetaminophen (TYLENOL) 650 MG CR tablet Take 1,300 mg by mouth every 8 (eight) hours as needed for pain. 01/21/13   Samuella Cota, MD  aspirin EC 81 MG tablet Take 1 tablet (81 mg total) by mouth daily. 09/01/13   Abelina Bachelor, PA-C  blood glucose meter kit and supplies Dispense based on patient and insurance preference. Use to monitor FSBS 1x daily. Dx: E11.9 10/16/14   Alycia Rossetti, MD  clotrimazole-betamethasone (LOTRISONE) cream Apply 1 application topically 2 (two) times daily. Patient not taking:  Reported on 10/13/2014 10/18/13   Alycia Rossetti, MD  diclofenac sodium (VOLTAREN) 1 % GEL Apply 2 g topically 4 (four) times daily as needed (arthritis pain to knees).    Historical Provider, MD  esomeprazole (NEXIUM) 20 MG capsule Take 20 mg by mouth daily at 12 noon.    Historical Provider, MD  furosemide (LASIX) 20 MG tablet Take 0.5 tablets (10 mg total) by mouth 2 (two) times daily. 09/15/14   Radene Gunning, NP  gabapentin (NEURONTIN) 300 MG capsule Take 1 capsule (300 mg total) by mouth 2 (two) times daily. 03/11/14   Alycia Rossetti, MD  levothyroxine (SYNTHROID, LEVOTHROID) 75 MCG tablet Take 75 mcg by mouth daily before breakfast. 08/25/13   Radene Gunning, NP  metoprolol succinate (TOPROL XL) 25 MG 24 hr tablet Take 1 tablet (25 mg total) by mouth daily. 09/15/14   Radene Gunning, NP  nystatin (MYCOSTATIN/NYSTOP) 100000 UNIT/GM POWD Apply to affected areas three times a day 12/09/13   Alycia Rossetti, MD  omeprazole (PRILOSEC) 20 MG capsule TAKE (1) CAPSULE BY MOUTH TWICE DAILY FOR HEARTBURN. 09/04/14   Alycia Rossetti, MD  polyethylene glycol powder (GLYCOLAX/MIRALAX) powder Take 17 g by mouth daily. 06/09/14   Alycia Rossetti, MD  potassium chloride (K-DUR) 10 MEQ tablet TAKE 1 TABLET BY MOUTH ONCE DAILY. 08/18/14   Alycia Rossetti, MD  predniSONE (DELTASONE) 5 MG tablet Take 10 mg by mouth daily with breakfast. 10/01/13   Alycia Rossetti, MD  senna-docusate (SENOKOT-S) 8.6-50 MG per tablet Take 1 tablet by mouth 2 (two) times daily. 09/15/14   Radene Gunning, NP  simvastatin (ZOCOR) 20 MG tablet Take 1 tablet (20 mg total) by mouth at bedtime. 01/28/13   Alycia Rossetti, MD   BP 161/76 mmHg  Pulse 107  Temp(Src) 98.5 F (36.9 C) (Oral)  Resp 20  Ht '5\' 5"'  (1.651 m)  Wt 226 lb (102.513 kg)  BMI 37.61 kg/m2  SpO2 95% Physical Exam  Constitutional: She is oriented to person, place, and time. She appears well-developed and well-nourished.  HENT:  Head: Normocephalic and atraumatic.   Right Ear: External ear normal.  Left Ear: External ear normal.  Nose: Nose normal.  Eyes: Right eye exhibits no discharge. Left eye exhibits no discharge.  Neck: Neck supple.  Cardiovascular: Normal rate, regular rhythm and normal heart sounds.   Pulmonary/Chest: Effort normal and breath sounds normal. She exhibits tenderness (chest tenderness along lower rib ~rib 10. no rash).  Abdominal: Soft. There is no tenderness.  Musculoskeletal: She exhibits no edema or tenderness.  Neurological: She is alert and oriented to person, place, and time.  Skin: Skin is warm and dry.  Nursing note and vitals reviewed.   ED Course  Procedures (including critical care time) Labs Review Labs Reviewed  CBC WITH DIFFERENTIAL/PLATELET - Abnormal; Notable for the following:    WBC 12.4 (*)    Neutro Abs 8.3 (*)    Eosinophils Relative 8 (*)    Eosinophils Absolute 1.0 (*)    All other components within normal limits  BASIC METABOLIC PANEL - Abnormal; Notable for the following:    Glucose, Bld 166 (*)    Calcium 8.3 (*)    GFR calc non Af Amer 79 (*)    All other components within normal limits  D-DIMER, QUANTITATIVE - Abnormal; Notable for the following:    D-Dimer, Quant 1.04 (*)    All other components within normal limits  BRAIN NATRIURETIC PEPTIDE - Abnormal; Notable for the following:    B Natriuretic Peptide 165.0 (*)    All other components within normal limits  TROPONIN I  TROPONIN I    Imaging Review Dg Ribs Unilateral W/chest Left  11/19/2014   CLINICAL DATA:  Several day history of left anterior lower rib pain. No known injury  EXAM: LEFT RIBS AND CHEST - 3+ VIEW  COMPARISON:  Chest radiograph October 07, 2014  FINDINGS: Frontal chest as well as oblique and cone-down lower rib images were obtained. There is no edema or consolidation. There is mild scarring in the right lower lobe. Heart is mildly enlarged with pulmonary vascularity within normal limits. Patient is status post median  sternotomy. There is atherosclerotic change throughout the aorta. There are surgical clips in the right axilla.  Bones are osteoporotic. No rib fracture apparent. No pneumothorax or effusion.  IMPRESSION: No rib fracture apparent. No edema or consolidation. Heart mildly enlarged but stable. Bones are osteoporotic.   Electronically Signed   By: Lowella Grip III M.D.   On: 11/19/2014 13:05     EKG Interpretation   Date/Time:  Thursday November 19 2014 13:27:48 EST Ventricular Rate:  76 PR Interval:    QRS Duration: 94 QT Interval:  406 QTC Calculation: 456 R Axis:   55 Text Interpretation:  Atrial fibrillation Ventricular premature complex  Anteroseptal infarct, old Nonspecific repol abnormality, diffuse leads  ST/T changes similar to Sep 12 2014 Confirmed by Regenia Skeeter  MD, Rozlyn Yerby  [phone]) on 11/19/2014 3:56:46 PM      MDM   Final diagnoses:  Chest pain, unspecified chest pain type    Patient's pain is very reproducible, no rash noted to suggest zoster at this time. EKG is abnormal with ST/T changes but these are unchanged since Dec 2015. However she has many risk factors for CAD. CT to be obtained to r/o PE given elevated ddimer. Will admit to hospitalist for ACS r/o and monitoring.    Ephraim Hamburger, MD 11/19/14 [id number]

## 2014-11-19 NOTE — ED Notes (Signed)
Pt reports pain in left rib that started yesterday.  Says is worse with movement and deep breathing.  Denies injury.  Denies SOb but says that Dr. Dorris Fetch sent here here for evaluation of possible fluid on lungs.  Denies cough/congestion.

## 2014-11-20 DIAGNOSIS — I482 Chronic atrial fibrillation: Secondary | ICD-10-CM | POA: Diagnosis not present

## 2014-11-20 DIAGNOSIS — R079 Chest pain, unspecified: Principal | ICD-10-CM

## 2014-11-20 DIAGNOSIS — I10 Essential (primary) hypertension: Secondary | ICD-10-CM | POA: Diagnosis not present

## 2014-11-20 DIAGNOSIS — E119 Type 2 diabetes mellitus without complications: Secondary | ICD-10-CM | POA: Diagnosis not present

## 2014-11-20 LAB — GLUCOSE, CAPILLARY
Glucose-Capillary: 97 mg/dL (ref 70–99)
Glucose-Capillary: 175 mg/dL — ABNORMAL HIGH (ref 70–99)

## 2014-11-20 MED ORDER — METOPROLOL SUCCINATE ER 25 MG PO TB24
25.0000 mg | ORAL_TABLET | Freq: Every day | ORAL | Status: DC
  Administered 2014-11-20: 25 mg via ORAL
  Filled 2014-11-20: qty 1

## 2014-11-20 NOTE — Progress Notes (Signed)
Discharge instructions given, verbalized understanding, out in stable condition with staff via w/c.

## 2014-11-20 NOTE — Discharge Instructions (Signed)

## 2014-11-20 NOTE — Discharge Summary (Signed)
Physician Discharge Summary  Kristin Logan MOQ:[id number] DOB: January 24, 1932 DOA: 11/19/2014  PCP: Vic Blackbird, MD  Admit date: 11/19/2014 Discharge date: 11/20/2014  Recommendations for Outpatient Follow-up:  1. Check CBC and BMP during next visit to PCP 2. Follow up with PCP in 1-2 weeks after discharge to make sure symptoms are controlled   Discharge Diagnoses:  Active Problems:   Diabetes mellitus type II, controlled   Essential hypertension   Atrial fibrillation, chronic   Aortic valve disease   MCI (mild cognitive impairment)   Recurrent falls   Chest pain    Discharge Condition: stable   Diet recommendation: as tolerated   History of present illness:  81 y.o. female with history of diabetes, hypertension, atrial fibrillation on anticoagulation with aspirin (not a candidate for anticoagulation because of previous intracerebral bleeds and a history of falls) who presented to [facility] with complaints of left sided rib cage pain which per patient's family is chronic but has gotten worse in past 24 hours since admission. Pt only took tylenol which provided some symptomatic relief. CT angio chest ruled out pulmonary embolism. No acute findings on CXR of the ribs.  Hospital Course:   Active Problems: Left side rib cage pain - no acute findings seen on CXR - CT angio chest negative for pulmonary embolism - per family, patient is sensitive to pain meds and get confusion with narcotics so they prefer she stays on tylenol for pain control - she feels better this am and wants to go home  Hypertension - continue metoprolol  Atrial fibrillation, chronic / Aortic valve disease - on [facility] with aspirin only - not a candidate for other AC due to history of falls and intracerebral bleed   Hypothyroidism - continue synthroid   Dyslipidemia - continue statin therapy     Signed:  Leisa Lenz, MD  [name] 11/20/2014, 10:41 AM  Pager #:  [phone]  Procedures:  None   Consultations:  None   Discharge Exam: Filed Vitals:   11/20/14 1033  BP: 192/63  Pulse: 67  Temp: 98.3 F (36.8 C)  Resp: 20   Filed Vitals:   11/19/14 2228 11/20/14 0226 11/20/14 0548 11/20/14 1033  BP: 182/83 207/70 188/83 192/63  Pulse: 79 96 79 67  Temp:  98.1 F (36.7 C) 98.3 F (36.8 C) 98.3 F (36.8 C)  TempSrc:  Oral Oral Oral  Resp:   20 20  Height:      Weight:      SpO2: 98% 97% 97% 98%    General: Pt is alert, follows commands appropriately, not in acute distress Cardiovascular: Regular rate and rhythm, S1/S2 appreciated  Respiratory: no wheezing, no crackles, no rhonchi Abdominal: non distended, bowel sounds +, no guarding Extremities: no edema, no cyanosis, pulses palpable bilaterally Neuro: Grossly nonfocal  Discharge Instructions  Discharge Instructions    Call MD for:  difficulty breathing, headache or visual disturbances    Complete by:  As directed      Call MD for:  persistant nausea and vomiting    Complete by:  As directed      Call MD for:  severe uncontrolled pain    Complete by:  As directed      Diet - low sodium heart healthy    Complete by:  As directed      Discharge instructions    Complete by:  As directed   Please follow up with PCP in next 1 week to make sure symptoms are stable  Increase activity slowly    Complete by:  As directed             Medication List    TAKE these medications        acetaminophen 650 MG CR tablet  Commonly known as:  TYLENOL  Take 1,300 mg by mouth every 8 (eight) hours as needed for pain.     albuterol (2.5 MG/3ML) 0.083% nebulizer solution  Commonly known as:  PROVENTIL  Take 2.5 mg by nebulization every 6 (six) hours as needed for wheezing or shortness of breath.     aspirin EC 81 MG tablet  Take 1 tablet (81 mg total) by mouth daily.     blood glucose meter kit and supplies  Dispense based on patient and insurance preference. Use to monitor  FSBS 1x daily. Dx: E11.9     clotrimazole-betamethasone cream  Commonly known as:  LOTRISONE  Apply 1 application topically 2 (two) times daily.     diclofenac sodium 1 % Gel  Commonly known as:  VOLTAREN  Apply 2 g topically 4 (four) times daily as needed (arthritis pain to knees).     esomeprazole 20 MG capsule  Commonly known as:  NEXIUM  Take 20 mg by mouth daily at 12 noon.     furosemide 20 MG tablet  Commonly known as:  LASIX  Take 0.5 tablets (10 mg total) by mouth 2 (two) times daily.     gabapentin 300 MG capsule  Commonly known as:  NEURONTIN  Take 1 capsule (300 mg total) by mouth 2 (two) times daily.     levothyroxine 75 MCG tablet  Commonly known as:  SYNTHROID, LEVOTHROID  Take 75 mcg by mouth daily before breakfast.     metoprolol succinate 25 MG 24 hr tablet  Commonly known as:  TOPROL XL  Take 1 tablet (25 mg total) by mouth daily.     nystatin 100000 UNIT/GM Powd  Apply to affected areas three times a day     omeprazole 20 MG capsule  Commonly known as:  PRILOSEC  TAKE (1) CAPSULE BY MOUTH TWICE DAILY FOR HEARTBURN.     polyethylene glycol powder powder  Commonly known as:  GLYCOLAX/MIRALAX  Take 17 g by mouth daily.     potassium chloride 10 MEQ tablet  Commonly known as:  K-DUR  TAKE 1 TABLET BY MOUTH ONCE DAILY.     predniSONE 5 MG tablet  Commonly known as:  DELTASONE  Take 10 mg by mouth daily with breakfast.     senna-docusate 8.6-50 MG per tablet  Commonly known as:  Senokot-S  Take 1 tablet by mouth 2 (two) times daily.     simvastatin 20 MG tablet  Commonly known as:  ZOCOR  Take 1 tablet (20 mg total) by mouth at bedtime.           Follow-up Information    Follow up with Vic Blackbird, MD.   Specialty:  Family Medicine   Why:  Follow up appt after recent hospitalization; for BP check   Contact information:   [address] [place] [address] [phone]        The results of significant diagnostics from  this hospitalization (including imaging, microbiology, ancillary and laboratory) are listed below for reference.    Significant Diagnostic Studies:  Dg Ribs Unilateral W/chest Left 11/19/2014  No rib fracture apparent. No edema or consolidation. Heart mildly enlarged but stable. Bones are osteoporotic.  Ct Angio Chest Pe  W/cm &/or Wo Cm 11/19/2014    1. No acute cardiopulmonary disease. No evidence of pulmonary edema. No pleural effusions. 2. No evidence of pneumonia. 3. Moderate cardiomegaly. Mild subsegmental atelectasis in the lower lungs. 4. Mild depression of the upper endplate of T3 as well as the central upper endplate of T4. This is new from the prior CT. These fractures could be recent or chronic. No other evidence of a recent or acute abnormality.     Microbiology: No results found for this or any previous visit (from the past 240 hour(s)).   Labs: Basic Metabolic Panel:  Recent Labs Lab 11/19/14 1410  NA 141  K 4.5  CL 107  CO2 29  GLUCOSE 166*  BUN 7  CREATININE 0.69  CALCIUM 8.3*   Liver Function Tests: No results for input(s): AST, ALT, ALKPHOS, BILITOT, PROT, ALBUMIN in the last 168 hours. No results for input(s): LIPASE, AMYLASE in the last 168 hours. No results for input(s): AMMONIA in the last 168 hours. CBC:  Recent Labs Lab 11/19/14 1410  WBC 12.4*  NEUTROABS 8.3*  HGB 12.9  HCT 40.4  MCV 93.1  PLT 251   Cardiac Enzymes:  Recent Labs Lab 11/19/14 1410 11/19/14 1706 11/19/14 1940 11/19/14 2248  TROPONINI <0.03 <0.03 <0.03 <0.03   BNP: BNP (last 3 results)  Recent Labs  11/19/14 1350  BNP 165.0*    ProBNP (last 3 results)  Recent Labs  12/22/13 1010  PROBNP 820.0*    CBG:  Recent Labs Lab 11/19/14 2258 11/20/14 0737  GLUCAP 199* 97    Time coordinating discharge: Over 30 minutes

## 2014-11-20 NOTE — Care Management Note (Signed)
    Page 1 of 1   11/20/2014     2:18:58 PM CARE MANAGEMENT NOTE 11/20/2014  Patient:  Kristin Logan, Kristin Logan   Account Number:  [id number]  Date Initiated:  11/20/2014  Documentation initiated by:  Jolene Provost  Subjective/Objective Assessment:   Pt admitted for r/o chest pain. Pt lives alone and has a CAP aid through [place] that comes 7 days a week. She also is active with [facility] for RN services. Pt recently discharged from [facility] for rehab.     Action/Plan:   Pt's son and daughter in law are very active in her care. Pt uses a walker to ambulate. Pt plans to discharge home with resumption of HH services, Romualdo Bolk of [facility] aware of obs status and discharge. [facility] referral made and family aware.   Anticipated DC Date:  11/20/2014   Anticipated DC Plan:  [place]  CM consult      Choice offered to / List presented to:          [facility] arranged  HH-1 RN      [place].   Status of service:  Completed, signed off Medicare Important Message given?   (If response is "NO", the following Medicare IM given date fields will be blank) Date Medicare IM given:   Medicare IM given by:   Date Additional Medicare IM given:   Additional Medicare IM given by:    Discharge Disposition:  [place]  Per UR Regulation:    If discussed at Long Length of Stay Meetings, dates discussed:    Comments:  11/20/2014 [place], RN, MSN, CM  No CM needs identfied at discharge.

## 2014-11-20 NOTE — Consult Note (Signed)
Received referral for [facility] Care Management services from inpatient RNCM. However, patient has already been discharged. Patient also has been engaged by [facility] very recent. However, [facility] social worker has been awaiting consents to be completed by family prior to proceeding with services. Consents had not been turned in thus far. Called and spoke with patient's daughter in law, [name] at [phone], who states they have [facility] paperwork filled out and they plan on calling the [facility] social worker to follow up from this point for [place] [facility] services.  Marthenia Rolling, MSN- [facility] [phone]

## 2014-11-20 NOTE — Progress Notes (Signed)
UR completed 

## 2014-11-26 NOTE — Progress Notes (Signed)
This is an addendum to the note. She has a power wheelchair at home which requires repairs. She continues to benefit from the use of her  Power wheelchair in her home . It provides her some independence with getting around to her dining room area as well as her bedroom and bath. It also provides her with mobility outside of the home. The power wheelchair is still a medical necessity for her.

## 2014-12-02 ENCOUNTER — Encounter: Payer: Self-pay | Admitting: Family Medicine

## 2014-12-02 ENCOUNTER — Ambulatory Visit (INDEPENDENT_AMBULATORY_CARE_PROVIDER_SITE_OTHER): Payer: Medicare Other | Admitting: Family Medicine

## 2014-12-02 VITALS — BP 160/78 | HR 76 | Temp 98.2°F | Resp 14 | Ht 65.0 in | Wt 235.0 lb

## 2014-12-02 DIAGNOSIS — E669 Obesity, unspecified: Secondary | ICD-10-CM | POA: Diagnosis not present

## 2014-12-02 DIAGNOSIS — I10 Essential (primary) hypertension: Secondary | ICD-10-CM

## 2014-12-02 DIAGNOSIS — E038 Other specified hypothyroidism: Secondary | ICD-10-CM

## 2014-12-02 DIAGNOSIS — E119 Type 2 diabetes mellitus without complications: Secondary | ICD-10-CM | POA: Diagnosis not present

## 2014-12-02 DIAGNOSIS — R609 Edema, unspecified: Secondary | ICD-10-CM | POA: Diagnosis not present

## 2014-12-02 MED ORDER — FUROSEMIDE 20 MG PO TABS: 10.0000 mg | ORAL_TABLET | Freq: Two times a day (BID) | ORAL | Status: DC

## 2014-12-02 MED ORDER — METOPROLOL SUCCINATE ER 25 MG PO TB24: 25.0000 mg | ORAL_TABLET | Freq: Every day | ORAL | Status: DC

## 2014-12-02 NOTE — Assessment & Plan Note (Signed)
Elevate feet Discussed use of compression hose  Taking lasix twice a day as prescribed We will f/u on Friday , may need to incrase lasix for 3 days to help with diuresis, but daughter in law will check first to make sure she has been taking

## 2014-12-02 NOTE — Patient Instructions (Addendum)
Use the compression hose  Low sugar, low carb diet  Continue the metoprolol for the blood pressure- morning  Take the lasix twice a day  We will check on the diabetes shoes  F/U as previous

## 2014-12-02 NOTE — Assessment & Plan Note (Signed)
Restart Toprol 25mg  once a day

## 2014-12-02 NOTE — Assessment & Plan Note (Signed)
Recheck A1C with increase in weight and diet changes

## 2014-12-02 NOTE — Progress Notes (Signed)
Patient ID: Kristin Logan, female   DOB: 01/04/1932, 81 y.o.   MRN: [id number]   Subjective:    Patient ID: Kristin Logan, female    DOB: 1932/05/21, 81 y.o.   MRN: [id number]  Patient presents for Hospital F/U  patient here for hospital follow-up. She was admitted for overnight observation as her home health nurse listen to her lungs and stated that it was decreased on the left side she also complained of some rib pain.  He was seen by her endocrinologist that same day and her blood pressure was elevated therefore he also recommended she go to the ER. Upon evaluation her d-dimer was noted to be slightly elevated CT angiogram was done did not show any blood clot there was no pneumonia and no fluid in the lungs and no rib fracture. She was monitored overnight and discharged home to restart her metoprolol. Regarding her endocrinologist her thyroid medication was increased secondary to psych secondary hypothyroidism. Unfortunately she is also gained a significant amount of weight from eating a lot of sweets and carbs at home which her nurse aid prepares. She's not had any recent falls.    Review Of Systems:  GEN- denies fatigue, fever, weight loss,weakness, recent illness HEENT- denies eye drainage, change in vision, nasal discharge, CVS- denies chest pain, palpitations RESP- denies SOB, cough, wheeze ABD- denies N/V, change in stools, abd pain GU- denies dysuria, hematuria, dribbling, incontinence MSK- +joint pain, muscle aches, injury Neuro- denies headache, dizziness, syncope, seizure activity       Objective:    BP 160/78 mmHg  Pulse 76  Temp(Src) 98.2 F (36.8 C) (Oral)  Resp 14  Ht 5\' 5"  (1.651 m)  Wt 235 lb (106.595 kg)  BMI 39.11 kg/m2 GEN- NAD, alert and oriented x3 HEENT- PERRL, EOMI, non injected sclera, pink conjunctiva, MMM, oropharynx clear CVS- RRR, 2/6 SEM RESP-CTAB ABD-NABS,soft,NT,ND EXT- + pitting edema 2cm above ankles Pulses- Radial, 2+ DP- 1+       Assessment & Plan:      Problem List Items Addressed This Visit      Unprioritized   Obesity   Hypothyroidism   Relevant Medications   levothyroxine (SYNTHROID, LEVOTHROID) 88 MCG tablet   metoprolol succinate (TOPROL-XL) 24 hr tablet   Hypocalcemia - Primary   Relevant Orders   Basic metabolic panel   Essential hypertension   Relevant Medications   metoprolol succinate (TOPROL-XL) 24 hr tablet   furosemide (LASIX) tablet   Other Relevant Orders   CBC with Differential/Platelet   Diabetes mellitus type II, controlled   Relevant Orders   Hemoglobin A1c      Note: This dictation was prepared with Dragon dictation along with smaller phrase technology. Any transcriptional errors that result from this process are unintentional.

## 2014-12-02 NOTE — Assessment & Plan Note (Signed)
Dietary changes need to be made, unfortunately pt Aide is not preparing the best meals for her and she is eating too many carbs

## 2014-12-03 LAB — CBC WITH DIFFERENTIAL/PLATELET
BASOS PCT: 1 % (ref 0–1)
Basophils Absolute: 0.1 10*3/uL (ref 0.0–0.1)
EOS ABS: 0.1 10*3/uL (ref 0.0–0.7)
Eosinophils Relative: 1 % (ref 0–5)
HCT: 41.8 % (ref 36.0–46.0)
Hemoglobin: 13.8 g/dL (ref 12.0–15.0)
Lymphocytes Relative: 15 % (ref 12–46)
Lymphs Abs: 1.6 10*3/uL (ref 0.7–4.0)
MCH: 29.8 pg (ref 26.0–34.0)
MCHC: 33 g/dL (ref 30.0–36.0)
MCV: 90.3 fL (ref 78.0–100.0)
MONOS PCT: 3 % (ref 3–12)
MPV: 9.8 fL (ref 8.6–12.4)
Monocytes Absolute: 0.3 10*3/uL (ref 0.1–1.0)
Neutro Abs: 8.6 10*3/uL — ABNORMAL HIGH (ref 1.7–7.7)
Neutrophils Relative %: 80 % — ABNORMAL HIGH (ref 43–77)
Platelets: 294 10*3/uL (ref 150–400)
RBC: 4.63 MIL/uL (ref 3.87–5.11)
RDW: 14.2 % (ref 11.5–15.5)
WBC: 10.7 10*3/uL — ABNORMAL HIGH (ref 4.0–10.5)

## 2014-12-03 LAB — BASIC METABOLIC PANEL
BUN: 7 mg/dL (ref 6–23)
CALCIUM: 9.4 mg/dL (ref 8.4–10.5)
CO2: 28 meq/L (ref 19–32)
Chloride: 105 mEq/L (ref 96–112)
Creat: 0.68 mg/dL (ref 0.50–1.10)
Glucose, Bld: 148 mg/dL — ABNORMAL HIGH (ref 70–99)
Potassium: 4.6 mEq/L (ref 3.5–5.3)
SODIUM: 144 meq/L (ref 135–145)

## 2014-12-03 LAB — HEMOGLOBIN A1C
Hgb A1c MFr Bld: 6.6 % — ABNORMAL HIGH (ref <5.7)
MEAN PLASMA GLUCOSE: 143 mg/dL — AB (ref <117)

## 2014-12-04 ENCOUNTER — Telehealth: Payer: Self-pay | Admitting: Family Medicine

## 2014-12-04 NOTE — Telephone Encounter (Signed)
-----   Message from [place], LPN sent at 10/08/4942 12:10 PM EST ----- Regarding: RE: Few things to f/u 1. Call placed to [facility] to inquire. Was advised that multiple phone calls have been placed to patient with no answer and no return call to have patient come in for fitting. Call placed to patient son, Clair Gulling and he was made aware to contact pharmacy to schedule appointment for fitting.   2. I thought we were not sending the order for the hover round???  3. I called Alisa. She had just left the patient's home, so she will try to get back today or first thing on Friday morning to get the UA C&S. She requested order to be faxed to [facility] office. Order is placed on desk for signature.   Delrae Rend also had a few concerns: 1. Patient is not weighing daily, checking her BP daily or her glucose daily. Aide was educated that these checks need to be done every day and how aide could assist patient. She requested order to be sent to [facility] for her aide to assist with daily monitoring. Order is placed on your desk for signature.   2. Aide is not preparing the most nutritious meals for patient at this time. Diet plan reviewed and Alisa educated aide on the importance of nutrition.   3.Pharmacy sent out (2) separate bottles of Metoprolol. One bottle was for patient, but one bottle was for another patient. The second bottle was Metoprolol 12.5mg , but the patient was taking it BID. Alisa returned the incorrect bottle to the pharmacy, and fixed the pill box for the next week. She plans to increase her visits to 2x monthly for a few months to make sure that patient is compliant.    ----- Message -----    From: Alycia Rossetti, MD    Sent: 12/02/2014   9:44 PM      To: Eden Lathe Six, LPN Subject: Few things to f/u                                1. Pt never heard back about diabetic shoes to [facility] sent at last visit   2. Never heard anything about servicing her [place] Left-  round   3. I need the nurse who is coming in the AM- I think ma be [facility] nurse to send off UA/Culture Ebony Hail)

## 2014-12-04 NOTE — Telephone Encounter (Signed)
Will sign orders Hooveround form needs to be refaxed- i already completed this  Also, already aware of the dietary concerns and changes for her aide

## 2014-12-14 ENCOUNTER — Other Ambulatory Visit: Payer: Self-pay | Admitting: Family Medicine

## 2014-12-14 ENCOUNTER — Ambulatory Visit (INDEPENDENT_AMBULATORY_CARE_PROVIDER_SITE_OTHER): Payer: Medicare Other | Admitting: Family Medicine

## 2014-12-14 ENCOUNTER — Encounter: Payer: Self-pay | Admitting: Family Medicine

## 2014-12-14 VITALS — BP 148/80 | HR 78 | Temp 97.6°F | Resp 18 | Ht 65.0 in | Wt 221.0 lb

## 2014-12-14 DIAGNOSIS — I10 Essential (primary) hypertension: Secondary | ICD-10-CM | POA: Diagnosis not present

## 2014-12-14 DIAGNOSIS — R609 Edema, unspecified: Secondary | ICD-10-CM | POA: Diagnosis not present

## 2014-12-14 DIAGNOSIS — S81811A Laceration without foreign body, right lower leg, initial encounter: Secondary | ICD-10-CM

## 2014-12-14 DIAGNOSIS — S81819A Laceration without foreign body, unspecified lower leg, initial encounter: Secondary | ICD-10-CM | POA: Insufficient documentation

## 2014-12-14 MED ORDER — GABAPENTIN 300 MG PO CAPS: 300.0000 mg | ORAL_CAPSULE | Freq: Two times a day (BID) | ORAL | Status: DC

## 2014-12-14 MED ORDER — SULFAMETHOXAZOLE-TRIMETHOPRIM 800-160 MG PO TABS: 1.0000 | ORAL_TABLET | Freq: Two times a day (BID) | ORAL | Status: DC

## 2014-12-14 NOTE — Patient Instructions (Addendum)
Hoover-round to be done New prescription for stockings  Bactrim antibiotic twice a day  Call me with the topical antibiotic F/U 3 months

## 2014-12-14 NOTE — Progress Notes (Signed)
Patient ID: Kristin Logan, female   DOB: 1932-04-29, 81 y.o.   MRN: [id number]   Subjective:    Patient ID: Kristin Logan, female    DOB: [birth year], 81 y.o.   MRN: [id number]  Patient presents for Follow-up  agent here for interim follow-up on her blood pressure as well as weight and swelling. She's been taking Lasix now down to 10 mg twice a day her weight has gone down significantly as well as her fluid she is also wearing her compression hose. We did have herTHN nurse come to the house to assess her and they discussed dietary changes with her nurse aid who prepares the meals.  She states that she hit her leg up against something last week she now has a gash and there is been some yellow drainage from the lesion she has not had any fever or sick neck again pain in the leg.  Hypertension she is taking her metoprolol as prescribed without any difficulty.    Review Of Systems:  GEN- denies fatigue, fever, weight loss,weakness, recent illness HEENT- denies eye drainage, change in vision, nasal discharge, CVS- denies chest pain, palpitations RESP- denies SOB, cough, wheeze ABD- denies N/V, change in stools, abd pain GU- denies dysuria, hematuria, dribbling, incontinence MSK- denies joint pain, muscle aches, injury Neuro- denies headache, dizziness, syncope, seizure activity       Objective:    BP 148/80 mmHg  Pulse 78  Temp(Src) 97.6 F (36.4 C) (Oral)  Resp 18  Ht 5\' 5"  (1.651 m)  Wt 221 lb (100.245 kg)  BMI 36.78 kg/m2 GEN- NAD, alert and oriented x3 HEENT- PERRL, EOMI, non injected sclera, pink conjunctiva, MMM, oropharynx clear CVS- RRR, 2/6 SEM RESP-CTAB EXT- pedal edema,wearing hose Skin- Right leg small 2cm gash, mild yellow drainage Pulses- Radial, 2+        Assessment & Plan:      Problem List Items Addressed This Visit      Unprioritized   Peripheral edema   Essential hypertension - Primary      Note: This dictation was prepared with Dragon  dictation along with smaller phrase technology. Any transcriptional errors that result from this process are unintentional.

## 2014-12-14 NOTE — Assessment & Plan Note (Signed)
Unclear cause aware of actual laceration came from however do see some signs of infection today therefore I'm going to place her on antibiotics that she is very high risk for skin infections. Wound was clean at the bedside instructions given to the family for care

## 2014-12-14 NOTE — Assessment & Plan Note (Signed)
Edema much improved today with some weight loss from fluid coming off. She will continue the current dose of 10 mg twice a day of Lasix

## 2014-12-14 NOTE — Assessment & Plan Note (Signed)
Blood pressure much improved continue current dose of metoprolol

## 2014-12-14 NOTE — Telephone Encounter (Signed)
Medication refilled per protocol. 

## 2014-12-22 ENCOUNTER — Other Ambulatory Visit: Payer: Self-pay | Admitting: *Deleted

## 2014-12-23 NOTE — Patient Instructions (Signed)

## 2014-12-23 NOTE — Patient Outreach (Signed)
 12/23/2014  Kristin Logan 07/19/1932 [id number]  Kristin Logan is an 81 y.o. female  Subjective: "I can't believe how much better I feel since I've been eating like you told me."  Objective: Review of Systems  Constitutional: Negative.   HENT: Negative.   Eyes: Negative.   Respiratory: Negative.   Cardiovascular: Negative.   Gastrointestinal: Negative.   Musculoskeletal: Positive for myalgias.  Neurological: Negative.   Psychiatric/Behavioral: Negative.     Physical Exam  Constitutional: She is oriented to person, place, and time. She appears well-developed and well-nourished.  Cardiovascular: Normal rate and regular rhythm.   Respiratory: Effort normal.  Neurological: She is alert and oriented to person, place, and time.  Skin: Skin is warm and dry.     Psychiatric: She has a normal mood and affect. Her speech is normal and behavior is normal. Judgment and thought content normal. Cognition and memory are normal.    Current Medications: Current Outpatient Prescriptions  Medication Sig Dispense Refill  . acetaminophen (TYLENOL) 650 MG CR tablet Take 1,300 mg by mouth every 8 (eight) hours as needed for pain.    . albuterol (PROVENTIL) (2.5 MG/3ML) 0.083% nebulizer solution Take 2.5 mg by nebulization every 6 (six) hours as needed for wheezing or shortness of breath.    . aspirin EC 81 MG tablet Take 1 tablet (81 mg total) by mouth daily.    . blood glucose meter kit and supplies Dispense based on patient and insurance preference. Use to monitor FSBS 1x daily. Dx: E11.9 1 each 0  . clotrimazole-betamethasone (LOTRISONE) cream Apply 1 application topically 2 (two) times daily. 30 g 1  . diclofenac sodium (VOLTAREN) 1 % GEL Apply 2 g topically 4 (four) times daily as needed (arthritis pain to knees).    . esomeprazole (NEXIUM) 20 MG capsule Take 20 mg by mouth daily at 12 noon.    . furosemide (LASIX) 20 MG tablet Take 0.5 tablets (10 mg total) by mouth 2 (two) times  daily. 30 tablet 6  . gabapentin (NEURONTIN) 300 MG capsule Take 1 capsule (300 mg total) by mouth 2 (two) times daily. 60 capsule 3  . levothyroxine (SYNTHROID, LEVOTHROID) 88 MCG tablet Take 88 mcg by mouth daily before breakfast.    . metoprolol succinate (TOPROL XL) 25 MG 24 hr tablet Take 1 tablet (25 mg total) by mouth daily. 30 tablet 6  . nystatin (MYCOSTATIN/NYSTOP) 100000 UNIT/GM POWD Apply to affected areas three times a day (Patient taking differently: Apply 1 g topically 3 (three) times daily as needed (rash). Apply to affected areas three times a day) 60 g 3  . omeprazole (PRILOSEC) 20 MG capsule TAKE (1) CAPSULE BY MOUTH TWICE DAILY FOR HEARTBURN. 60 capsule 5  . polyethylene glycol powder (GLYCOLAX/MIRALAX) powder Take 17 g by mouth daily.    . potassium chloride (K-DUR) 10 MEQ tablet TAKE 1 TABLET BY MOUTH ONCE DAILY. 30 tablet 3  . predniSONE (DELTASONE) 5 MG tablet Take 10 mg by mouth daily with breakfast.    . senna-docusate (SENOKOT-S) 8.6-50 MG per tablet Take 1 tablet by mouth 2 (two) times daily.    . simvastatin (ZOCOR) 20 MG tablet Take 1 tablet (20 mg total) by mouth at bedtime. 30 tablet 6  . sulfamethoxazole-trimethoprim (BACTRIM DS) 800-160 MG per tablet Take 1 tablet by mouth 2 (two) times daily. 14 tablet 0   No current facility-administered medications for this visit.    Functional Status: In your present state of health, do   you have any difficulty performing the following activities: 12/22/2014 11/19/2014  Is the patient deaf or have difficulty hearing? N Y  Hearing N N  Vision Y Y  Difficulty concentrating or making decisions Y Y  Walking or climbing stairs? Y Y  Doing errands, shopping? Y N  Preparing Food and eating ? Y -  Using the Toilet? Y -  In the past six months, have you accidently leaked urine? Y -  Do you have problems with loss of bowel control? N -  Managing your Medications? Y -  Managing your Finances? N -  Housekeeping or managing your  Housekeeping? Y -    Fall/Depression Screening: PHQ 2/9 Scores 12/22/2014 12/09/2013  PHQ - 2 Score 0 0    Assessment:  Chronic Health Condition (HTN) - Kristin Logan was not weighing daily OR checking blood pressure but  Started last month after our initial visit; she has a reliable scale and I provided a blood pressure monitor and reviewed plan for daily weights and blood pressure checks with patient and CAP worker; daily weights and blood pressure will be recorded in blue THN notebook and taken to every provider visit; Kristin Logan will call for weight gain of 3# overnight or 5# in a week or edema, shortness of breath, chest pain, lightheadedness, or other unusual/worsening symtpoms  Chronic Health Condition (DM) - Kristin Logan began checking in her cbg daily after our initial visit last month; I reviewed with patient and CAP worker the importance of checking daily; we put "check sugar" on her daily checklist in her blue THN notebook; she is to record the reading each day in her book and take it to each provider appointment for review; we spent a great deal of time last month on dietary instruction and Kristin Logan has done an excellent job of dietary adherence and says she feels much better  Medication Non-Adherence -  reviewed medications with patient, CAP worker, and daughter in law Kristin Logan by phone; better job on med adherence this month  Plan:  Patient/caregivers will check cbg, blood pressure, and weight daily and record, calling for findings outside physician recommended parameters.  Routine Home Visit April:  detailed review of carb modified diet  bp, cbg, weight chart review  Dm assessment  THN CM Care Plan        Patient Outreach from 12/22/2014 in [facility]   Care Plan Problem One  Rising blood pressure readings   Care Plan for Problem One  Active   Interventions for Problem One Long Term Goal  utilizing teachback method, reviewed use of new monitor and  recording practices with patient and CAP worker   THN Long Term Goal (31-90 days)  patient will self monitor BP 25 out of the next 31 days   THN Long Term Goal Start Date  12/03/14   Care Plan Problem Two  Rising HgA1C and weight gain secondary to knowledge deficit regarding prescribed diet   Care Plan for Problem Two  Active   THN Long Term Goal (31-90) days  atient will not gain weight or have continuing increase in HgA1C over the next 60 days as evidenced by patient self report of weight and lab data   THN Long Term Goal Start Date  12/03/14   THN CM Short Term Goal #1 (0-30 days)  atient will begin daily self monitoring of cbg's and will record in blue THN notebook as evidenced by documentation review in 2 weeks   THN CM   Short Term Goal #1 Start Date  12/03/14   THN CM Short Term Goal #1 Met Date   12/22/14   THN CM Short Term Goal #2 (0-30 days)  patient and CAP worker will verbalize basic understanding of prescribed heart healthy, carb modified diet in the next 30 days   THN CM Short Term Goal #2 Start Date  12/03/14   Care Plan Problem Three  Home Safety/Fall risk   Care Plan for Problem Three  Active   THN Long Term Goal (31-90) days  patient will not have hospitalizations or ED visits related to falls over the next 31 days   [facility] Long Term Goal Start Date  11/25/14      [employer]  [address] [place], [place] [address] [email or web address]   [phone] (mobile)  [phone] (main office) [phone] (fax)

## 2014-12-25 ENCOUNTER — Telehealth: Payer: Self-pay | Admitting: Family Medicine

## 2014-12-25 NOTE — Telephone Encounter (Signed)
-----   Message from [place], LPN sent at 01/18/6221 12:42 PM EDT ----- Regarding: RE: F/U [facility]- pt hasnt heard anything Call placed to [facility] [phone]) reference # [id number].  Spoke with Gatha Mayer in Agricultural consultant. Was advised that patient Medicaid Card continues to show [facility] as PCP, therefore, they cannot continue with billing.   Patient must contact Medicaid and correct card before repair can be scheduled.   Call placed to patient and patient family. [place]. ----- Message -----    From: Alycia Rossetti, MD    Sent: 12/14/2014   5:18 PM      To: [place], LPN Subject: F/U [facility] maint[facility]ance- pt hasnt heard a#

## 2014-12-29 ENCOUNTER — Telehealth: Payer: Self-pay | Admitting: Family Medicine

## 2014-12-29 MED ORDER — SIMVASTATIN 20 MG PO TABS: 20.0000 mg | ORAL_TABLET | Freq: Every day | ORAL | Status: DC

## 2014-12-29 NOTE — Telephone Encounter (Signed)
Patient needs refills on her simvastatin daughter in law is calling to request this, she has not refill   [phone] if any questions  [employer]

## 2014-12-29 NOTE — Telephone Encounter (Signed)
Prescription sent to pharmacy.

## 2015-01-14 ENCOUNTER — Encounter (HOSPITAL_COMMUNITY): Payer: Self-pay | Admitting: Emergency Medicine

## 2015-01-14 ENCOUNTER — Emergency Department (HOSPITAL_COMMUNITY): Payer: Medicare Other

## 2015-01-14 ENCOUNTER — Inpatient Hospital Stay (HOSPITAL_COMMUNITY)
Admission: EM | Admit: 2015-01-14 | Discharge: 2015-01-31 | DRG: 871 | Disposition: E | Payer: Medicare Other | Attending: Internal Medicine | Admitting: Internal Medicine

## 2015-01-14 DIAGNOSIS — M179 Osteoarthritis of knee, unspecified: Secondary | ICD-10-CM | POA: Diagnosis present

## 2015-01-14 DIAGNOSIS — I358 Other nonrheumatic aortic valve disorders: Secondary | ICD-10-CM | POA: Diagnosis present

## 2015-01-14 DIAGNOSIS — Z7952 Long term (current) use of systemic steroids: Secondary | ICD-10-CM

## 2015-01-14 DIAGNOSIS — G4730 Sleep apnea, unspecified: Secondary | ICD-10-CM | POA: Diagnosis present

## 2015-01-14 DIAGNOSIS — Z7982 Long term (current) use of aspirin: Secondary | ICD-10-CM | POA: Diagnosis not present

## 2015-01-14 DIAGNOSIS — Z8673 Personal history of transient ischemic attack (TIA), and cerebral infarction without residual deficits: Secondary | ICD-10-CM | POA: Diagnosis not present

## 2015-01-14 DIAGNOSIS — J189 Pneumonia, unspecified organism: Secondary | ICD-10-CM | POA: Diagnosis present

## 2015-01-14 DIAGNOSIS — Z923 Personal history of irradiation: Secondary | ICD-10-CM | POA: Diagnosis not present

## 2015-01-14 DIAGNOSIS — Z515 Encounter for palliative care: Secondary | ICD-10-CM

## 2015-01-14 DIAGNOSIS — E119 Type 2 diabetes mellitus without complications: Secondary | ICD-10-CM

## 2015-01-14 DIAGNOSIS — G9340 Encephalopathy, unspecified: Secondary | ICD-10-CM | POA: Diagnosis present

## 2015-01-14 DIAGNOSIS — Y95 Nosocomial condition: Secondary | ICD-10-CM | POA: Diagnosis present

## 2015-01-14 DIAGNOSIS — A419 Sepsis, unspecified organism: Principal | ICD-10-CM | POA: Diagnosis present

## 2015-01-14 DIAGNOSIS — R4182 Altered mental status, unspecified: Secondary | ICD-10-CM

## 2015-01-14 DIAGNOSIS — Z9011 Acquired absence of right breast and nipple: Secondary | ICD-10-CM | POA: Diagnosis present

## 2015-01-14 DIAGNOSIS — I10 Essential (primary) hypertension: Secondary | ICD-10-CM | POA: Diagnosis present

## 2015-01-14 DIAGNOSIS — I482 Chronic atrial fibrillation, unspecified: Secondary | ICD-10-CM | POA: Diagnosis present

## 2015-01-14 DIAGNOSIS — E669 Obesity, unspecified: Secondary | ICD-10-CM | POA: Diagnosis present

## 2015-01-14 DIAGNOSIS — I6340 Cerebral infarction due to embolism of unspecified cerebral artery: Secondary | ICD-10-CM | POA: Insufficient documentation

## 2015-01-14 DIAGNOSIS — Z823 Family history of stroke: Secondary | ICD-10-CM | POA: Diagnosis not present

## 2015-01-14 DIAGNOSIS — E039 Hypothyroidism, unspecified: Secondary | ICD-10-CM | POA: Diagnosis present

## 2015-01-14 DIAGNOSIS — Z791 Long term (current) use of non-steroidal anti-inflammatories (NSAID): Secondary | ICD-10-CM | POA: Diagnosis not present

## 2015-01-14 DIAGNOSIS — Z8249 Family history of ischemic heart disease and other diseases of the circulatory system: Secondary | ICD-10-CM | POA: Diagnosis not present

## 2015-01-14 DIAGNOSIS — Z66 Do not resuscitate: Secondary | ICD-10-CM | POA: Diagnosis present

## 2015-01-14 DIAGNOSIS — Z809 Family history of malignant neoplasm, unspecified: Secondary | ICD-10-CM | POA: Diagnosis not present

## 2015-01-14 DIAGNOSIS — Z853 Personal history of malignant neoplasm of breast: Secondary | ICD-10-CM | POA: Diagnosis not present

## 2015-01-14 DIAGNOSIS — I639 Cerebral infarction, unspecified: Secondary | ICD-10-CM | POA: Diagnosis present

## 2015-01-14 DIAGNOSIS — Z952 Presence of prosthetic heart valve: Secondary | ICD-10-CM

## 2015-01-14 DIAGNOSIS — I6330 Cerebral infarction due to thrombosis of unspecified cerebral artery: Secondary | ICD-10-CM | POA: Insufficient documentation

## 2015-01-14 DIAGNOSIS — R069 Unspecified abnormalities of breathing: Secondary | ICD-10-CM

## 2015-01-14 DIAGNOSIS — I359 Nonrheumatic aortic valve disorder, unspecified: Secondary | ICD-10-CM | POA: Diagnosis not present

## 2015-01-14 DIAGNOSIS — Z6837 Body mass index (BMI) 37.0-37.9, adult: Secondary | ICD-10-CM | POA: Diagnosis not present

## 2015-01-14 LAB — CBC WITH DIFFERENTIAL/PLATELET
BASOS ABS: 0 10*3/uL (ref 0.0–0.1)
BASOS PCT: 0 % (ref 0–1)
EOS PCT: 5 % (ref 0–5)
Eosinophils Absolute: 0.9 10*3/uL — ABNORMAL HIGH (ref 0.0–0.7)
HEMATOCRIT: 45.9 % (ref 36.0–46.0)
Hemoglobin: 15.3 g/dL — ABNORMAL HIGH (ref 12.0–15.0)
LYMPHS ABS: 2.7 10*3/uL (ref 0.7–4.0)
Lymphocytes Relative: 14 % (ref 12–46)
MCH: 29.9 pg (ref 26.0–34.0)
MCHC: 33.3 g/dL (ref 30.0–36.0)
MCV: 89.8 fL (ref 78.0–100.0)
Monocytes Absolute: 0.4 10*3/uL (ref 0.1–1.0)
Monocytes Relative: 2 % — ABNORMAL LOW (ref 3–12)
NEUTROS PCT: 79 % — AB (ref 43–77)
Neutro Abs: 15.5 10*3/uL — ABNORMAL HIGH (ref 1.7–7.7)
PLATELETS: 253 10*3/uL (ref 150–400)
RBC: 5.11 MIL/uL (ref 3.87–5.11)
RDW: 14 % (ref 11.5–15.5)
WBC: 19.5 10*3/uL — AB (ref 4.0–10.5)

## 2015-01-14 LAB — URINALYSIS, ROUTINE W REFLEX MICROSCOPIC
Bilirubin Urine: NEGATIVE
GLUCOSE, UA: NEGATIVE mg/dL
Hgb urine dipstick: NEGATIVE
KETONES UR: NEGATIVE mg/dL
Leukocytes, UA: NEGATIVE
NITRITE: NEGATIVE
PROTEIN: NEGATIVE mg/dL
Specific Gravity, Urine: 1.01 (ref 1.005–1.030)
Urobilinogen, UA: 0.2 mg/dL (ref 0.0–1.0)
pH: 6.5 (ref 5.0–8.0)

## 2015-01-14 LAB — I-STAT CHEM 8, ED
BUN: 12 mg/dL (ref 6–23)
CHLORIDE: 100 mmol/L (ref 96–112)
Calcium, Ion: 1.11 mmol/L — ABNORMAL LOW (ref 1.13–1.30)
Creatinine, Ser: 1 mg/dL (ref 0.50–1.10)
Glucose, Bld: 116 mg/dL — ABNORMAL HIGH (ref 70–99)
HEMATOCRIT: 47 % — AB (ref 36.0–46.0)
Hemoglobin: 16 g/dL — ABNORMAL HIGH (ref 12.0–15.0)
POTASSIUM: 3.1 mmol/L — AB (ref 3.5–5.1)
Sodium: 139 mmol/L (ref 135–145)
TCO2: 22 mmol/L (ref 0–100)

## 2015-01-14 LAB — BRAIN NATRIURETIC PEPTIDE: B NATRIURETIC PEPTIDE 5: 182 pg/mL — AB (ref 0.0–100.0)

## 2015-01-14 LAB — PROCALCITONIN: PROCALCITONIN: 0.53 ng/mL

## 2015-01-14 LAB — TROPONIN I
Troponin I: 0.03 ng/mL (ref <0.031)
Troponin I: 0.11 ng/mL — ABNORMAL HIGH (ref <0.031)

## 2015-01-14 LAB — LACTIC ACID, PLASMA: Lactic Acid, Venous: 3.7 mmol/L (ref 0.5–2.0)

## 2015-01-14 LAB — GLUCOSE, CAPILLARY
GLUCOSE-CAPILLARY: 127 mg/dL — AB (ref 70–99)
Glucose-Capillary: 95 mg/dL (ref 70–99)

## 2015-01-14 LAB — I-STAT CG4 LACTIC ACID, ED
Lactic Acid, Venous: 4.17 mmol/L (ref 0.5–2.0)
Lactic Acid, Venous: 3.71 mmol/L (ref 0.5–2.0)

## 2015-01-14 LAB — APTT: APTT: 23 s — AB (ref 24–37)

## 2015-01-14 LAB — PROTIME-INR
INR: 0.98 (ref 0.00–1.49)
PROTHROMBIN TIME: 13.1 s (ref 11.6–15.2)

## 2015-01-14 LAB — MRSA PCR SCREENING: MRSA by PCR: NEGATIVE

## 2015-01-14 MED ORDER — VANCOMYCIN HCL IN DEXTROSE 1-5 GM/200ML-% IV SOLN
1000.0000 mg | INTRAVENOUS | Status: AC
  Administered 2015-01-14: 1000 mg via INTRAVENOUS
  Filled 2015-01-14: qty 200

## 2015-01-14 MED ORDER — SODIUM CHLORIDE 0.9 % IV BOLUS (SEPSIS)
1000.0000 mL | Freq: Once | INTRAVENOUS | Status: AC
  Administered 2015-01-14: 1000 mL via INTRAVENOUS

## 2015-01-14 MED ORDER — ACETAMINOPHEN 650 MG RE SUPP
650.0000 mg | Freq: Four times a day (QID) | RECTAL | Status: DC | PRN
  Administered 2015-01-14: 650 mg via RECTAL
  Filled 2015-01-14: qty 1

## 2015-01-14 MED ORDER — SODIUM CHLORIDE 0.9 % IV BOLUS (SEPSIS)
1000.0000 mL | INTRAVENOUS | Status: AC
  Administered 2015-01-14: 1000 mL via INTRAVENOUS

## 2015-01-14 MED ORDER — DEXTROSE 5 % IV SOLN
1.0000 g | Freq: Three times a day (TID) | INTRAVENOUS | Status: DC
  Administered 2015-01-14 – 2015-01-15 (×2): 1 g via INTRAVENOUS
  Filled 2015-01-14 (×12): qty 1

## 2015-01-14 MED ORDER — INSULIN ASPART 100 UNIT/ML ~~LOC~~ SOLN: 0.0000 [IU] | Freq: Three times a day (TID) | SUBCUTANEOUS | Status: DC

## 2015-01-14 MED ORDER — ONDANSETRON HCL 4 MG/2ML IJ SOLN: 4.0000 mg | Freq: Four times a day (QID) | INTRAMUSCULAR | Status: DC | PRN

## 2015-01-14 MED ORDER — ONDANSETRON HCL 4 MG PO TABS: 4.0000 mg | ORAL_TABLET | Freq: Four times a day (QID) | ORAL | Status: DC | PRN

## 2015-01-14 MED ORDER — METHYLPREDNISOLONE SODIUM SUCC 40 MG IJ SOLR
40.0000 mg | Freq: Four times a day (QID) | INTRAMUSCULAR | Status: DC
  Filled 2015-01-14: qty 1

## 2015-01-14 MED ORDER — SODIUM CHLORIDE 0.9 % IV BOLUS (SEPSIS): 500.0000 mL | INTRAVENOUS | Status: AC

## 2015-01-14 MED ORDER — METHYLPREDNISOLONE SODIUM SUCC 125 MG IJ SOLR
50.0000 mg | Freq: Three times a day (TID) | INTRAMUSCULAR | Status: AC
  Administered 2015-01-14 – 2015-01-15 (×3): 50 mg via INTRAVENOUS
  Filled 2015-01-14 (×3): qty 2

## 2015-01-14 MED ORDER — SODIUM CHLORIDE 0.9 % IV BOLUS (SEPSIS): 1000.0000 mL | Freq: Once | INTRAVENOUS | Status: AC

## 2015-01-14 MED ORDER — VANCOMYCIN HCL IN DEXTROSE 1-5 GM/200ML-% IV SOLN
1000.0000 mg | Freq: Once | INTRAVENOUS | Status: AC
  Administered 2015-01-14: 1000 mg via INTRAVENOUS
  Filled 2015-01-14: qty 200

## 2015-01-14 MED ORDER — INSULIN ASPART 100 UNIT/ML ~~LOC~~ SOLN: 0.0000 [IU] | Freq: Every day | SUBCUTANEOUS | Status: DC

## 2015-01-14 MED ORDER — ENOXAPARIN SODIUM 40 MG/0.4ML ~~LOC~~ SOLN
40.0000 mg | SUBCUTANEOUS | Status: DC
  Administered 2015-01-14: 40 mg via SUBCUTANEOUS
  Filled 2015-01-14: qty 0.4

## 2015-01-14 MED ORDER — PIPERACILLIN-TAZOBACTAM 3.375 G IVPB 30 MIN
3.3750 g | Freq: Once | INTRAVENOUS | Status: AC
  Administered 2015-01-14: 3.375 g via INTRAVENOUS
  Filled 2015-01-14: qty 50

## 2015-01-14 MED ORDER — VANCOMYCIN HCL IN DEXTROSE 750-5 MG/150ML-% IV SOLN
750.0000 mg | Freq: Two times a day (BID) | INTRAVENOUS | Status: DC
  Administered 2015-01-15: 750 mg via INTRAVENOUS
  Filled 2015-01-14 (×8): qty 150

## 2015-01-14 MED ORDER — SODIUM CHLORIDE 0.9 % IV SOLN
INTRAVENOUS | Status: DC
  Administered 2015-01-14 – 2015-01-17 (×2): via INTRAVENOUS

## 2015-01-14 MED ORDER — ASPIRIN 300 MG RE SUPP
300.0000 mg | Freq: Every day | RECTAL | Status: DC
Start: 2015-01-14 — End: 2015-01-15
  Administered 2015-01-14: 300 mg via RECTAL
  Filled 2015-01-14: qty 1

## 2015-01-14 NOTE — ED Notes (Signed)
Pt LNW last night at 2000. Non-rebreather in place at time of arrival. Pt alert and eyes open but not responding to command or answering questions. cbg en route 140.

## 2015-01-14 NOTE — Progress Notes (Signed)
[place] for Vancomycin & Cefepime Indication: pneumonia, sepsis  Allergies  Allergen Reactions  . Coumadin [Warfarin Sodium] Other (See Comments)    Patient had ICH  . Nsaids Other (See Comments)    Bleeding ulcer  . Codeine Nausea And Vomiting  . Adhesive [Tape] Rash    Redness, and peeling skin off.     Patient Measurements: Height: 5\' 7"  (170.2 cm) Weight: 240 lb (108.863 kg) IBW/kg (Calculated) : 61.6  Vital Signs: Temp: 99.2 F (37.3 C) (04/14 1701) Temp Source: Axillary (04/14 1701) BP: 120/61 mmHg (04/14 1400) Pulse Rate: 121 (04/14 1415) Intake/Output from previous day:   Intake/Output from this shift: Total I/O In: -  Out: 300 [Urine:300]  Labs:  Recent Labs  01/04/2015 1000 01/04/2015 1127  WBC 19.5*  --   HGB 15.3* 16.0*  PLT 253  --   CREATININE  --  1.00   Estimated Creatinine Clearance: 56.1 mL/min (by C-G formula based on Cr of 1). No results for input(s): VANCOTROUGH, VANCOPEAK, VANCORANDOM, GENTTROUGH, GENTPEAK, GENTRANDOM, TOBRATROUGH, TOBRAPEAK, TOBRARND, AMIKACINPEAK, AMIKACINTROU, AMIKACIN in the last 72 hours.   Microbiology: Recent Results (from the past 720 hour(s))  Blood culture (routine x 2)     Status: None (Preliminary result)   Collection Time: 01/25/2015 10:53 AM  Result Value Ref Range Status   Specimen Description BLOOD LEFT ANTECUBITAL  Final   Special Requests   Final    BOTTLES DRAWN AEROBIC AND ANAEROBIC AEB=6CC ANA=4CC   Culture PENDING  Incomplete   Report Status PENDING  Incomplete  Blood culture (routine x 2)     Status: None (Preliminary result)   Collection Time: 01/08/2015 11:01 AM  Result Value Ref Range Status   Specimen Description BLOOD LEFT HAND  Final   Special Requests   Final    BOTTLES DRAWN AEROBIC AND ANAEROBIC AEB=6CC ANA=4CC   Culture PENDING  Incomplete   Report Status PENDING  Incomplete    Anti-infectives    Start     Dose/Rate Route Frequency Ordered Stop   01/08/2015 2359  vancomycin (VANCOCIN) IVPB 750 mg/150 ml premix     750 mg 150 mL/hr over 60 Minutes Intravenous Every 12 hours 01/07/2015 1717     01/01/2015 1700  ceFEPIme (MAXIPIME) 1 g in dextrose 5 % 50 mL IVPB     1 g 100 mL/hr over 30 Minutes Intravenous Every 8 hours 01/08/2015 1520     01/08/2015 1615  vancomycin (VANCOCIN) IVPB 1000 mg/200 mL premix     1,000 mg 200 mL/hr over 60 Minutes Intravenous NOW 01/10/2015 1520 01/15/15 1615   01/20/2015 1130  piperacillin-tazobactam (ZOSYN) IVPB 3.375 g     3.375 g 100 mL/hr over 30 Minutes Intravenous  Once 01/10/2015 1115 01/29/2015 1201   01/05/2015 1130  vancomycin (VANCOCIN) IVPB 1000 mg/200 mL premix     1,000 mg 200 mL/hr over 60 Minutes Intravenous  Once 01/16/2015 1115 01/01/2015 1442      Assessment: 81 yo obese  F presenting with AMS.  She is starting on empiric, broad-spectrum antibiotics for possible PNA, sepsis.  She is febrile on admission with elevated WBC and lactic acid.  CXR shows possible PNA.  Renal function at patient's baseline. NCrCl ~ 54ml/min.   Vancomycin 4/14>> Cefepime 4/14>>   Goal of Therapy:  Vancomycin trough level 15-20 mcg/ml  Plan:  Cefepime 1gm IV q8h Vancomycin 1gm IV x1 now (for total 2gm IV loading dose today) following by Vancomycin 750mg  IV q12h Check  Vancomycin trough at steady state Monitor renal function and cx data   Biagio Borg 01/08/2015,5:17 PM

## 2015-01-14 NOTE — ED Notes (Signed)
EDP at bedside  

## 2015-01-14 NOTE — ED Notes (Addendum)
Hospitalist physician at bedside.

## 2015-01-14 NOTE — ED Notes (Signed)
Pt has been clicked ready but has not gone to CT yet. CT reports will get pt as soon as possible.

## 2015-01-14 NOTE — ED Provider Notes (Signed)
CSN: [id number]     Arrival date & time 01/12/2015  1000 History  This chart was scribed for No att. providers found by Mercy Moore, ED scribe.  This patient was seen in room Room/bed info not found and the patient's care was started at 10:04 AM.   Chief Complaint  Patient presents with  . Altered Mental Status    Level 5 Caveat  Patient is a 81 y.o. female presenting with altered mental status. The history is provided by the EMS personnel (pt found down today.  last seen normal 8 pm yesterday). No language interpreter was used.  Altered Mental Status Presenting symptoms: lethargy and unresponsiveness   Severity:  Severe Most recent episode:  Today Episode history:  Single Timing:  Constant Progression:  Unchanged Chronicity:  New Context: not alcohol use   Associated symptoms: normal movement    HPI Comments: Kristin Logan is a 81 y.o. female with extensive PMHx who presents to the Emergency Department after being found unresponsive and unconscious at home by her nurse. Patient was last seen responsive/appearing asymptomatic last night at approximately 8pm, 14 hours ago.  EMS reports dilated pupils and no response to painful stimuli. EMS endorses history of hemorrhagic stroke.    Past Medical History  Diagnosis Date  . Diabetes mellitus   . Hyperlipidemia   . Eosinophilia   . Cataract   . Hypertension   . Atrial fibrillation prior to 2008    moderate biatrial enlargement in 2009; mild to moderate LVH with a low-normal EF  . GERD (gastroesophageal reflux disease)     Hemorrhoids; history of peptic ulcer disease  . DJD (degenerative joint disease) of knee   . Chronic anticoagulation   . Aortic valve disease     2009: mild stenosis and regurgitation; peak gradient of 30-35 mmHg , subsequent AVR at [facility]  . Incontinence     Mild  . Hip fracture, left 12/12/2012  . Foot injury 06/25/2012  . Degenerative joint disease of knee 01/29/2008    Bilateral    . BREAST CANCER, HX OF  01/29/2008    Lumpectomy, radiation therapy  Arimidex stopped in July 2013    . Syncope and collapse 11/11/2012  . Diverticulosis   . Umbilical hernia   . Cholelithiasis   . DDD (degenerative disc disease), lumbar   . Vertebral compression fracture     lumbar  . Chronic pain   . Paresthesias     feet  . Gastroparesis   . Sleep apnea     CPAP  . Carcinoma of breast     Right mastectomy; radiation and hormonal therapy  . Stroke 09/2013    Hemorrhagic stroke while on coumadin  . Adrenal insufficiency 08/23/2013    On chronic prednisone  . Chronic a-fib   . Cyst, Baker's knee 09/12/2014  . Syncope 09/11/2014  . Cellulitis of leg, right 09/11/2014   Past Surgical History  Procedure Laterality Date  . Exploration post operative open heart    . Colonoscopy      Approximately 2000  . Cardiac valve replacement      [facility]  . Cardiac valve replacement    . Breast surgery    . Breast lumpectomy    . Esophagogastroduodenoscopy N/A 02/20/2013    Procedure: ESOPHAGOGASTRODUODENOSCOPY (EGD);  Surgeon: Rogene Houston, MD;  Location: [facility] ENDO SUITE;  Service: Endoscopy;  Laterality: N/A;  . Mastectomy     Family History  Problem Relation Age of Onset  . Heart disease  Mother   . Stroke Father   . Cancer Sister   . Heart disease Sister   . Stroke Sister   . Stroke Brother    History  Substance Use Topics  . Smoking status: Never Smoker   . Smokeless tobacco: Never Used  . Alcohol Use: No   OB History    No data available     Review of Systems  Unable to perform ROS: Mental status change      Allergies  Coumadin; Nsaids; Codeine; and Adhesive  Home Medications   Prior to Admission medications   Medication Sig Start Date End Date Taking? Authorizing Provider  acetaminophen (TYLENOL) 650 MG CR tablet Take 1,300 mg by mouth every 8 (eight) hours as needed for pain. 01/21/13   Samuella Cota, MD  albuterol (PROVENTIL) (2.5 MG/3ML) 0.083% nebulizer solution Take 2.5 mg by  nebulization every 6 (six) hours as needed for wheezing or shortness of breath.    Historical Provider, MD  aspirin EC 81 MG tablet Take 1 tablet (81 mg total) by mouth daily. 09/01/13   Abelina Bachelor, PA-C  blood glucose meter kit and supplies Dispense based on patient and insurance preference. Use to monitor FSBS 1x daily. Dx: E11.9 10/16/14   Alycia Rossetti, MD  clotrimazole-betamethasone (LOTRISONE) cream Apply 1 application topically 2 (two) times daily. 10/18/13   Alycia Rossetti, MD  diclofenac sodium (VOLTAREN) 1 % GEL Apply 2 g topically 4 (four) times daily as needed (arthritis pain to knees).    Historical Provider, MD  esomeprazole (NEXIUM) 20 MG capsule Take 20 mg by mouth daily at 12 noon.    Historical Provider, MD  furosemide (LASIX) 20 MG tablet Take 0.5 tablets (10 mg total) by mouth 2 (two) times daily. 12/02/14   Alycia Rossetti, MD  gabapentin (NEURONTIN) 300 MG capsule Take 1 capsule (300 mg total) by mouth 2 (two) times daily. 12/14/14   Alycia Rossetti, MD  levothyroxine (SYNTHROID, LEVOTHROID) 88 MCG tablet Take 88 mcg by mouth daily before breakfast.    Historical Provider, MD  metoprolol succinate (TOPROL XL) 25 MG 24 hr tablet Take 1 tablet (25 mg total) by mouth daily. 12/02/14   Alycia Rossetti, MD  nystatin (MYCOSTATIN/NYSTOP) 100000 UNIT/GM POWD Apply to affected areas three times a day Patient taking differently: Apply 1 g topically 3 (three) times daily as needed (rash). Apply to affected areas three times a day 12/09/13   Alycia Rossetti, MD  omeprazole (PRILOSEC) 20 MG capsule TAKE (1) CAPSULE BY MOUTH TWICE DAILY FOR HEARTBURN. 09/04/14   Alycia Rossetti, MD  polyethylene glycol powder (GLYCOLAX/MIRALAX) powder Take 17 g by mouth daily. 06/09/14   Alycia Rossetti, MD  potassium chloride (K-DUR) 10 MEQ tablet TAKE 1 TABLET BY MOUTH ONCE DAILY. 08/18/14   Alycia Rossetti, MD  predniSONE (DELTASONE) 5 MG tablet Take 10 mg by mouth daily with breakfast. 10/01/13   Alycia Rossetti, MD  senna-docusate (SENOKOT-S) 8.6-50 MG per tablet Take 1 tablet by mouth 2 (two) times daily. 09/15/14   Radene Gunning, NP  simvastatin (ZOCOR) 20 MG tablet Take 1 tablet (20 mg total) by mouth at bedtime. 12/29/14   Alycia Rossetti, MD  sulfamethoxazole-trimethoprim (BACTRIM DS) 800-160 MG per tablet Take 1 tablet by mouth 2 (two) times daily. 12/14/14   Alycia Rossetti, MD   There were no vitals taken for this visit. Physical Exam  Constitutional: She appears well-developed.  Patient  is awake. She will only respond to painful stimuli.   HENT:  Head: Normocephalic.  Good gag reflex.  Eyes: Conjunctivae and EOM are normal. No scleral icterus.  Neck: Neck supple. No thyromegaly present.  Cardiovascular: Normal rate.  Exam reveals no gallop and no friction rub.   No murmur heard. Tachycardic with fast irregular heart beat.  Pulmonary/Chest: No stridor. She has no wheezes. She has no rales. She exhibits no tenderness.  Crackles heard bilaterally.   Abdominal: She exhibits no distension. There is no tenderness. There is no rebound.  Musculoskeletal: Normal range of motion. She exhibits edema.  2+ edema in both legs.  Lymphadenopathy:    She has no cervical adenopathy.  Neurological: She exhibits normal muscle tone. Coordination normal.  Skin: No rash noted. No erythema.  Nursing note and vitals reviewed.   ED Course  Procedures (including critical care time)  COORDINATION OF CARE: 10:13 AM-   Labs Review Labs Reviewed - No data to display  Imaging Review No results found.   EKG Interpretation   Date/Time:  Thursday January 14 2015 10:08:32 EDT Ventricular Rate:  120 PR Interval:    QRS Duration: 96 QT Interval:  314 QTC Calculation: 444 R Axis:   49 Text Interpretation:  Atrial fibrillation Anterior infarct, old Repol  abnrm, severe global ischemia (LM/MVD) Confirmed by   MD,   [phone]) on 01/06/2015 11:21:43 AM     CRITICAL CARE Performed by:  , L Total critical care time: 45 Critical care time was exclusive of separately billable procedures and treating other patients. Critical care was necessary to treat or prevent imminent or life-threatening deterioration. Critical care was time spent personally by me on the following activities: development of treatment plan with patient and/or surrogate as well as nursing, discussions with consultants, evaluation of patient's response to treatment, examination of patient, obtaining history from patient or surrogate, ordering and performing treatments and interventions, ordering and review of laboratory studies, ordering and review of radiographic studies, pulse oximetry and re-evaluation of patient's condition.   MDM   Final diagnoses:  None    Septic admit  Milton Ferguson, MD 01/25/2015 1149

## 2015-01-14 NOTE — ED Notes (Signed)
CRITICAL VALUE ALERT  Critical value received: lactic acid  Date of notification:  01/05/2015  Time of notification:  3785  Critical value read back:Yes.    Nurse who received alert:  Laurell Josephs RN  MD notified (1st page):  [place]  Time of first page:  1135  MD notified (2nd page):  Time of second page:  Responding MD:  Roderic Palau  Time MD responded:  8850

## 2015-01-14 NOTE — H&P (Signed)
[name] History and Physical  Kristin Logan MWN:[id number] DOB: 1931/12/13 DOA: 01/04/2015  Referring physician:  PCP: Vic Blackbird, MD   Chief Complaint: Altered mental status  HPI: Kristin Logan is a 81 y.o. female with a past medical history that includes, breast cancer, stroke, chronic pain, stroke, adrenal insufficiency, A. fib, presents to the emergency department with the chief complaint of altered mental status. Initial evaluation in the emergency department revealed a sepsis patient with a white count 19.5, rectal temp 100.2 tachycardia at 130 and a chest x-ray worrisome for pneumonia. Daughters at bedside report she was found by her nursing aide this morning unconscious in bed. Patient does live alone she uses a walker and a wheelchair and has daily assistance. Daughter reports she spoke to patient 8:00 last night on telephone and everything seemed fine. The home nurse reported dilated pupils and no response to painful stimuli. Workup in the emergency department lids complete blood count significant for leukocytosis of 19.5 hemoglobin is 40.3, metabolic panel reveals potassium of 3.1, initial troponin negative. Initial lactic acid 4.1 7 repeat lactic acid 3.7. Chest x-ray hazy airspace has passed and right upper lung zone worrisome for pneumonia. CT of the head without acute abnormality. Temperature is 99.2 core blood pressure stable 130/73 heart rate 116 oxygen saturation level greater than 90% on 2 L In the emergency department she was given Vanco and Zosyn and 3 L of normal saline.    Review of Systems:  Unable to obtain review of system due to acute illness  Past Medical History  Diagnosis Date  . Diabetes mellitus   . Hyperlipidemia   . Eosinophilia   . Cataract   . Hypertension   . Atrial fibrillation prior to 2008    moderate biatrial enlargement in 2009; mild to moderate LVH with a low-normal EF  . GERD (gastroesophageal reflux disease)    Hemorrhoids; history of peptic ulcer disease  . DJD (degenerative joint disease) of knee   . Chronic anticoagulation   . Aortic valve disease     2009: mild stenosis and regurgitation; peak gradient of 30-35 mmHg , subsequent AVR at [facility]  . Incontinence     Mild  . Hip fracture, left 12/12/2012  . Foot injury 06/25/2012  . Degenerative joint disease of knee 01/29/2008    Bilateral    . BREAST CANCER, HX OF 01/29/2008    Lumpectomy, radiation therapy  Arimidex stopped in July 2013    . Syncope and collapse 11/11/2012  . Diverticulosis   . Umbilical hernia   . Cholelithiasis   . DDD (degenerative disc disease), lumbar   . Vertebral compression fracture     lumbar  . Chronic pain   . Paresthesias     feet  . Gastroparesis   . Sleep apnea     CPAP  . Carcinoma of breast     Right mastectomy; radiation and hormonal therapy  . Stroke 09/2013    Hemorrhagic stroke while on coumadin  . Adrenal insufficiency 08/23/2013    On chronic prednisone  . Chronic a-fib   . Cyst, Baker's knee 09/12/2014  . Syncope 09/11/2014  . Cellulitis of leg, right 09/11/2014   Past Surgical History  Procedure Laterality Date  . Exploration post operative open heart    . Colonoscopy      Approximately 2000  . Cardiac valve replacement      [facility]  . Cardiac valve replacement    . Breast surgery    . Breast  lumpectomy    . Esophagogastroduodenoscopy N/A 02/20/2013    Procedure: ESOPHAGOGASTRODUODENOSCOPY (EGD);  Surgeon: Rogene Houston, MD;  Location: [facility];  Service: Endoscopy;  Laterality: N/A;  . Mastectomy     Social History:  reports that she has never smoked. She has never used smokeless tobacco. She reports that she does not drink alcohol or use illicit drugs. Patient lives at home she is mostly sedentary but fairly independent with ADLs uses a walker and wheelchair Allergies  Allergen Reactions  . Coumadin [Warfarin Sodium] Other (See Comments)    Patient had ICH  . Nsaids Other (See  Comments)    Bleeding ulcer  . Codeine Nausea And Vomiting  . Adhesive [Tape] Rash    Redness, and peeling skin off.     Family History  Problem Relation Age of Onset  . Heart disease Mother   . Stroke Father   . Cancer Sister   . Heart disease Sister   . Stroke Sister   . Stroke Brother     Prior to Admission medications   Medication Sig Start Date End Date Taking? Authorizing Provider  acetaminophen (TYLENOL) 650 MG CR tablet Take 1,300 mg by mouth every 8 (eight) hours as needed for pain. 01/21/13   Samuella Cota, MD  albuterol (PROVENTIL) (2.5 MG/3ML) 0.083% nebulizer solution Take 2.5 mg by nebulization every 6 (six) hours as needed for wheezing or shortness of breath.    Historical Provider, MD  aspirin EC 81 MG tablet Take 1 tablet (81 mg total) by mouth daily. 09/01/13   Abelina Bachelor, PA-C  blood glucose meter kit and supplies Dispense based on patient and insurance preference. Use to monitor FSBS 1x daily. Dx: E11.9 10/16/14   Alycia Rossetti, MD  clotrimazole-betamethasone (LOTRISONE) cream Apply 1 application topically 2 (two) times daily. 10/18/13   Alycia Rossetti, MD  diclofenac sodium (VOLTAREN) 1 % GEL Apply 2 g topically 4 (four) times daily as needed (arthritis pain to knees).    Historical Provider, MD  esomeprazole (NEXIUM) 20 MG capsule Take 20 mg by mouth daily at 12 noon.    Historical Provider, MD  furosemide (LASIX) 20 MG tablet Take 0.5 tablets (10 mg total) by mouth 2 (two) times daily. 12/02/14   Alycia Rossetti, MD  gabapentin (NEURONTIN) 300 MG capsule Take 1 capsule (300 mg total) by mouth 2 (two) times daily. 12/14/14   Alycia Rossetti, MD  levothyroxine (SYNTHROID, LEVOTHROID) 88 MCG tablet Take 88 mcg by mouth daily before breakfast.    Historical Provider, MD  metoprolol succinate (TOPROL XL) 25 MG 24 hr tablet Take 1 tablet (25 mg total) by mouth daily. 12/02/14   Alycia Rossetti, MD  nystatin (MYCOSTATIN/NYSTOP) 100000 UNIT/GM POWD Apply to affected  areas three times a day Patient taking differently: Apply 1 g topically 3 (three) times daily as needed (rash). Apply to affected areas three times a day 12/09/13   Alycia Rossetti, MD  omeprazole (PRILOSEC) 20 MG capsule TAKE (1) CAPSULE BY MOUTH TWICE DAILY FOR HEARTBURN. 09/04/14   Alycia Rossetti, MD  polyethylene glycol powder (GLYCOLAX/MIRALAX) powder Take 17 g by mouth daily. 06/09/14   Alycia Rossetti, MD  potassium chloride (K-DUR) 10 MEQ tablet TAKE 1 TABLET BY MOUTH ONCE DAILY. 08/18/14   Alycia Rossetti, MD  predniSONE (DELTASONE) 5 MG tablet Take 10 mg by mouth daily with breakfast. 10/01/13   Alycia Rossetti, MD  senna-docusate (SENOKOT-S) 8.6-50 MG per  tablet Take 1 tablet by mouth 2 (two) times daily. 09/15/14   Radene Gunning, NP  simvastatin (ZOCOR) 20 MG tablet Take 1 tablet (20 mg total) by mouth at bedtime. 12/29/14   Alycia Rossetti, MD  sulfamethoxazole-trimethoprim (BACTRIM DS) 800-160 MG per tablet Take 1 tablet by mouth 2 (two) times daily. 12/14/14   Alycia Rossetti, MD   Physical Exam: Filed Vitals:   01/01/2015 1155 01/15/2015 1200 01/05/2015 1214 01/29/2015 1230  BP:  138/71  130/73  Pulse:  120  117  Temp: 99.2 F (37.3 C)  98.8 F (37.1 C)   TempSrc: Core (Comment)  Core (Comment)   Resp:  22  23  Height:      Weight:      SpO2:  91%  95%    Wt Readings from Last 3 Encounters:  01/06/2015 108.863 kg (240 lb)  12/22/14 99.791 kg (220 lb)  12/14/14 100.245 kg (221 lb)    General:  Obese mild response to fairly vigorous sternal rub Eyes: PERRL, normal lids, irises no scleral icterus ENT: grossly normal hearing, lips & tongue weakness membranes of her mouth are pink but dry Neck: no LAD, masses or thyromegaly full range of motion Cardiovascular: Tachycardic and fairly regular occasional ectopic beat no murmur no gallop 1+ lower extremity edema Respiratory: Normal effort somewhat shallow airflow diminished fine crackles particularly in the bases Abdomen: soft,  ntnd obese positive bowel sounds Skin: no rash or induration seen on limited exam, skin very thin with several skin tears Musculoskeletal: grossly normal tone BUE/BLE Psychiatric: grossly normal mood and affect, speech fluent and appropriate Neurologic: grossly non-focal. She moves extremities to fairly vigorous sternal rub otherwise no response           Labs on Admission:  Basic Metabolic Panel:  Recent Labs Lab 01/07/2015 1127  NA 139  K 3.1*  CL 100  GLUCOSE 116*  BUN 12  CREATININE 1.00   Liver Function Tests: No results for input(s): AST, ALT, ALKPHOS, BILITOT, PROT, ALBUMIN in the last 168 hours. No results for input(s): LIPASE, AMYLASE in the last 168 hours. No results for input(s): AMMONIA in the last 168 hours. CBC:  Recent Labs Lab 01/10/2015 1000 01/26/2015 1127  WBC 19.5*  --   NEUTROABS 15.5*  --   HGB 15.3* 16.0*  HCT 45.9 47.0*  MCV 89.8  --   PLT 253  --    Cardiac Enzymes:  Recent Labs Lab 01/12/2015 1000  TROPONINI 0.03    BNP (last 3 results)  Recent Labs  11/19/14 1350 01/10/2015 1000  BNP 165.0* 182.0*    ProBNP (last 3 results) No results for input(s): PROBNP in the last 8760 hours.  CBG: No results for input(s): GLUCAP in the last 168 hours.  Radiological Exams on Admission: Ct Head Wo Contrast  01/13/2015   CLINICAL DATA:  Altered mental status. Patient found unconscious and unresponsive today.  EXAM: CT HEAD WITHOUT CONTRAST  TECHNIQUE: Contiguous axial images were obtained from the base of the skull through the vertex without intravenous contrast.  COMPARISON:  Head CT scan 09/11/2014.  FINDINGS: Atrophy and extensive chronic microvascular ischemic change are identified. 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 There is no acute intracranial abnormality including hemorrhage, infarct, mass lesion, mass effect, midline shift or abnormal extra-axial fluid collection. No hydrocephalus or  pneumocephalus. The calvarium is intact.  IMPRESSION: No acute finding.  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  Atrophy and extensive chronic microvascular ischemic change.   Electronically Signed   By: Inge Rise M.D.   On: 01/12/2015 11:23   Dg Chest Portable 1 View  01/23/2015   CLINICAL DATA:  Altered mental status. History of breast cancer. Fever.  EXAM: PORTABLE CHEST - 1 VIEW  COMPARISON:  CT chest and single view of the chest 11/19/2014. PA and lateral chest 12/22/2013.  FINDINGS: Lung volumes are lower than on the comparison studies. There is cardiomegaly. Hazy airspace disease is seen over the right upper lung zone. Mild basilar atelectasis is noted.  IMPRESSION: Hazy airspace has passed and right upper lung zone worrisome for pneumonia.  Cardiomegaly without edema.   Electronically Signed   By: Inge Rise M.D.   On: 01/23/2015 10:47    EKG: Independently reviewed. Atrial fibrillation  Assessment/Plan Principal Problem:   Sepsis: Etiology uncertain chest x-ray concerning for pneumonia now this unremarkable will admit to ICU will provide vigorous fluid resuscitation per protocol. M also get a get an MRI to rule out neurological event as patient was in her usual state of health last night at 8 PM. Blood cultures are pending antibiotics per pharmacy Bank and Zosyn. At time of my exam in the emergency department blood pressure was stable was trending down tachycardia improving. Will monitor closely  Active Problems:  HCAP (healthcare-associated pneumonia): Bank and Zosyn per pharmacy. Blood cultures are pending. Fever is coming down with IV fluids.  Acute encephalopathy related to #1 and #2. Also get an MRI to rule out any neurological event. Check a TSH and B-12   Atrial fibrillation, chronic heart rate 116 at the time of my exam. Home medications include Toprol. Hold this for now. Monitor rate closely and resume when indicated     Diabetes mellitus type II, controlled: Diet controlled. Will use sliding scale insulin for optimal control check a hemoglobin A1c. He'll glucose 116 on admission    Essential hypertension; stable in emergency department. Will hold her home medications for now     Sleep apnea: See Pap 1 appropriate        ;      Obesity: BMI 37.7     Hypothyroidism; will check a TSH      Code Status: DNR DVT Prophylaxis: Family Communication: daughters at bedside Disposition Plan: to be determined.  Time spent: 65 minutes  [facility] Pager [phone]

## 2015-01-14 NOTE — ED Notes (Addendum)
Per MRI staff, Temp Foley is not MRI safe. Hospitalist aware and reported to d/c foley at this time. MRI RN asked EDP about stopping abx and NS bolus while pt in MRI. EDP give verbal order to pause all infusions this time.

## 2015-01-14 NOTE — ED Notes (Addendum)
Attempted temp foley. No success at this time.

## 2015-01-14 NOTE — ED Notes (Signed)
EMS normal saline infusing by gravity at time of arrival. Pt received approximately 500cc at this time.

## 2015-01-14 NOTE — ED Notes (Addendum)
Pt 02 saturation 88% on 4liters. o2 increased to 6 liters. Pt o2 saturation 96-100%. EDP at bedside and reported to keep pt o2 saturation >90%.

## 2015-01-14 NOTE — ED Notes (Addendum)
I-Stat Lactic acid 4.17. Dr. Roderic Palau aware.

## 2015-01-14 NOTE — ED Notes (Signed)
hospitalist at bedside

## 2015-01-14 NOTE — ED Notes (Signed)
CRITICAL VALUE ALERT  Critical value received:  Lactic Acid 3.71  Date of notification:  01/12/2015  Time of notification:  [id number]  Critical value read back:Yes.    Nurse who received alert:  Parthenia Ames  MD notified (1st page):  Dr. Truman Hayward [facility])  Time of first page:  1402  MD notified (2nd page):  Time of second page:  Responding MD:  Dr. Truman Hayward  Time MD responded:  [phone]

## 2015-01-15 ENCOUNTER — Inpatient Hospital Stay (HOSPITAL_COMMUNITY): Payer: Medicare Other

## 2015-01-15 ENCOUNTER — Encounter: Payer: Self-pay | Admitting: *Deleted

## 2015-01-15 ENCOUNTER — Ambulatory Visit: Payer: Medicare Other | Admitting: Family Medicine

## 2015-01-15 DIAGNOSIS — I482 Chronic atrial fibrillation: Secondary | ICD-10-CM

## 2015-01-15 DIAGNOSIS — G9340 Encephalopathy, unspecified: Secondary | ICD-10-CM

## 2015-01-15 DIAGNOSIS — E119 Type 2 diabetes mellitus without complications: Secondary | ICD-10-CM

## 2015-01-15 LAB — HEMOGLOBIN A1C
Hgb A1c MFr Bld: 6.9 % — ABNORMAL HIGH (ref 4.8–5.6)
Mean Plasma Glucose: 151 mg/dL

## 2015-01-15 MED ORDER — MORPHINE SULFATE 25 MG/ML IV SOLN
2.0000 mg/h | INTRAVENOUS | Status: DC
  Administered 2015-01-15: 2 mg/h via INTRAVENOUS
  Administered 2015-01-17: 5 mg/h via INTRAVENOUS
  Filled 2015-01-15 (×3): qty 10

## 2015-01-15 MED ORDER — SCOPOLAMINE 1 MG/3DAYS TD PT72
1.0000 | MEDICATED_PATCH | TRANSDERMAL | Status: DC
  Administered 2015-01-15: 1.5 mg via TRANSDERMAL
  Filled 2015-01-15 (×2): qty 1

## 2015-01-15 MED ORDER — CETYLPYRIDINIUM CHLORIDE 0.05 % MT LIQD
7.0000 mL | Freq: Two times a day (BID) | OROMUCOSAL | Status: DC
  Administered 2015-01-15 – 2015-01-18 (×6): 7 mL via OROMUCOSAL

## 2015-01-15 MED ORDER — SCOPOLAMINE 1 MG/3DAYS TD PT72
MEDICATED_PATCH | TRANSDERMAL | Status: AC
  Filled 2015-01-15: qty 1

## 2015-01-15 NOTE — Progress Notes (Signed)
Lab unable to obtain night time Lactic Acid okay to draw with morning labs per T. Rogue Bussing.

## 2015-01-15 NOTE — Progress Notes (Signed)
[name] PROGRESS NOTE  FELIPA LAROCHE TZG:[id number] DOB: [birth year]    PCP:   Vic Blackbird, MD   HPI: Kristin Logan is an 81 y.o. female with hx of prior hemorrhagic CVA, cerebral aneurysm, afib with prior coumadin use, admitted for urosepsis and abrupt AMS, suspicious for acute CVA, though the MRI did not show any acute process. She was given IV Cefepime, but this morning, she is in agonal breathing.  She is not doing well and has been a DNR. Family have been at bedside, requesting comfort care.    Past Medical History  Diagnosis Date  . Diabetes mellitus   . Hyperlipidemia   . Eosinophilia   . Cataract   . Hypertension   . Atrial fibrillation prior to 2008    moderate biatrial enlargement in 2009; mild to moderate LVH with a low-normal EF  . GERD (gastroesophageal reflux disease)     Hemorrhoids; history of peptic ulcer disease  . DJD (degenerative joint disease) of knee   . Chronic anticoagulation   . Aortic valve disease     2009: mild stenosis and regurgitation; peak gradient of 30-35 mmHg , subsequent AVR at [facility]  . Incontinence     Mild  . Hip fracture, left 12/12/2012  . Foot injury 06/25/2012  . Degenerative joint disease of knee 01/29/2008    Bilateral    . BREAST CANCER, HX OF 01/29/2008    Lumpectomy, radiation therapy  Arimidex stopped in July 2013    . Syncope and collapse 11/11/2012  . Diverticulosis   . Umbilical hernia   . Cholelithiasis   . DDD (degenerative disc disease), lumbar   . Vertebral compression fracture     lumbar  . Chronic pain   . Paresthesias     feet  . Gastroparesis   . Sleep apnea     CPAP  . Carcinoma of breast     Right mastectomy; radiation and hormonal therapy  . Stroke 09/2013    Hemorrhagic stroke while on coumadin  . Adrenal insufficiency 08/23/2013    On chronic prednisone  . Chronic a-fib   . Cyst, Baker's knee 09/12/2014  . Syncope 09/11/2014  . Cellulitis of leg, right 09/11/2014    Past  Surgical History  Procedure Laterality Date  . Exploration post operative open heart    . Colonoscopy      Approximately 2000  . Cardiac valve replacement      [facility]  . Cardiac valve replacement    . Breast surgery    . Breast lumpectomy    . Esophagogastroduodenoscopy N/A 02/20/2013    Procedure: ESOPHAGOGASTRODUODENOSCOPY (EGD);  Surgeon: Rogene Houston, MD;  Location: [facility];  Service: Endoscopy;  Laterality: N/A;  . Mastectomy      Medications:  HOME MEDS: Prior to Admission medications   Medication Sig Start Date End Date Taking? Authorizing Provider  acetaminophen (TYLENOL) 650 MG CR tablet Take 1,300 mg by mouth every 8 (eight) hours as needed for pain. 01/21/13  Yes Samuella Cota, MD  albuterol (PROVENTIL) (2.5 MG/3ML) 0.083% nebulizer solution Take 2.5 mg by nebulization every 6 (six) hours as needed for wheezing or shortness of breath.   Yes Historical Provider, MD  aspirin EC 81 MG tablet Take 1 tablet (81 mg total) by mouth daily. 09/01/13  Yes Abelina Bachelor, PA-C  diclofenac sodium (VOLTAREN) 1 % GEL Apply 2 g topically 4 (four) times daily as needed (arthritis pain to knees).   Yes Historical Provider,  MD  furosemide (LASIX) 20 MG tablet Take 0.5 tablets (10 mg total) by mouth 2 (two) times daily. 12/02/14  Yes Alycia Rossetti, MD  gabapentin (NEURONTIN) 300 MG capsule Take 1 capsule (300 mg total) by mouth 2 (two) times daily. 12/14/14  Yes Alycia Rossetti, MD  levothyroxine (SYNTHROID, LEVOTHROID) 75 MCG tablet Take 75 mcg by mouth daily before breakfast.   Yes Historical Provider, MD  metoprolol succinate (TOPROL XL) 25 MG 24 hr tablet Take 1 tablet (25 mg total) by mouth daily. 12/02/14  Yes Alycia Rossetti, MD  nystatin (MYCOSTATIN/NYSTOP) 100000 UNIT/GM POWD Apply to affected areas three times a day Patient taking differently: Apply 1 g topically 3 (three) times daily as needed (rash). Apply to affected areas three times a day 12/09/13  Yes Alycia Rossetti, MD   omeprazole (PRILOSEC) 20 MG capsule TAKE (1) CAPSULE BY MOUTH TWICE DAILY FOR HEARTBURN. 09/04/14  Yes Alycia Rossetti, MD  polyethylene glycol powder (GLYCOLAX/MIRALAX) powder Take 17 g by mouth daily. 06/09/14  Yes Alycia Rossetti, MD  potassium chloride (K-DUR) 10 MEQ tablet TAKE 1 TABLET BY MOUTH ONCE DAILY. 08/18/14  Yes Alycia Rossetti, MD  predniSONE (DELTASONE) 5 MG tablet Take 10 mg by mouth daily with breakfast. 10/01/13  Yes Alycia Rossetti, MD  senna-docusate (SENOKOT-S) 8.6-50 MG per tablet Take 1 tablet by mouth 2 (two) times daily. 09/15/14  Yes Lezlie Octave Black, NP  simvastatin (ZOCOR) 20 MG tablet Take 1 tablet (20 mg total) by mouth at bedtime. 12/29/14  Yes Alycia Rossetti, MD  blood glucose meter kit and supplies Dispense based on patient and insurance preference. Use to monitor FSBS 1x daily. Dx: E11.9 10/16/14   Alycia Rossetti, MD     Allergies:  Allergies  Allergen Reactions  . Coumadin [Warfarin Sodium] Other (See Comments)    Patient had ICH  . Nsaids Other (See Comments)    Bleeding ulcer  . Codeine Nausea And Vomiting  . Adhesive [Tape] Rash    Redness, and peeling skin off.     Social History:   reports that she has never smoked. She has never used smokeless tobacco. She reports that she does not drink alcohol or use illicit drugs.  Family History: Family History  Problem Relation Age of Onset  . Heart disease Mother   . Stroke Father   . Cancer Sister   . Heart disease Sister   . Stroke Sister   . Stroke Brother      Physical Exam: Filed Vitals:   01/15/15 0600 01/15/15 0700 01/15/15 0710 01/15/15 0800  BP: 169/120 198/177 174/83   Pulse: 93 128 122 123  Temp:   100.2 F (37.9 C)   TempSrc:   Rectal   Resp: '30 26 29 25  ' Height:      Weight:      SpO2: 72% 99% 99% 97%   Blood pressure 174/83, pulse 123, temperature 100.2 F (37.9 C), temperature source Rectal, resp. rate 25, height '5\' 7"'  (1.702 m), weight 118.4 kg (261 lb 0.4 oz), SpO2  97 %.  GEN: patient lying in the stretcher in no acute distress;  PSYCH:  does not appear anxious or depressed; affect is appropriate. Breasts:: Not examined CHEST WALL: No tenderness CHEST: She is in agonal breathing.  HEART: Regular rate and rhythm.  There are no murmur, rub, or gallops.   BACK: No kyphosis or scoliosis; no CVA tenderness ABDOMEN: soft and non-tender; no masses, no organomegaly,  normal abdominal bowel sounds; no pannus; no intertriginous candida. There is no rebound and no distention. Rectal Exam: Not done EXTREMITIES: No bone or joint deformity; age-appropriate arthropathy of the hands and knees; no edema; no ulcerations.  There is no calf tenderness. Genitalia: not examined PULSES: 2+ and symmetric SKIN: Normal hydration no rash or ulceration CNS: comatose.   Labs on Admission:  Basic Metabolic Panel:  Recent Labs Lab 01/13/2015 1127  NA 139  K 3.1*  CL 100  GLUCOSE 116*  BUN 12  CREATININE 1.00   Liver Function Tests: No results for input(s): AST, ALT, ALKPHOS, BILITOT, PROT, ALBUMIN in the last 168 hours. No results for input(s): LIPASE, AMYLASE in the last 168 hours. No results for input(s): AMMONIA in the last 168 hours. CBC:  Recent Labs Lab 01/27/2015 1000 01/12/2015 1127  WBC 19.5*  --   NEUTROABS 15.5*  --   HGB 15.3* 16.0*  HCT 45.9 47.0*  MCV 89.8  --   PLT 253  --    Cardiac Enzymes:  Recent Labs Lab 01/21/2015 1000 01/26/2015 2003  TROPONINI 0.03 0.11*    CBG:  Recent Labs Lab 01/25/2015 1610 01/13/2015 2155  GLUCAP 95 127*     Radiological Exams on Admission: Ct Head Wo Contrast  01/24/2015   CLINICAL DATA:  Altered mental status. Patient found unconscious and unresponsive today.  EXAM: CT HEAD WITHOUT CONTRAST  TECHNIQUE: Contiguous axial images were obtained from the base of the skull through the vertex without intravenous contrast.  COMPARISON:  Head CT scan 09/11/2014.  FINDINGS: Atrophy and extensive chronic microvascular  ischemic change are identified. 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 There is no acute intracranial abnormality including hemorrhage, infarct, mass lesion, mass effect, midline shift or abnormal extra-axial fluid collection. No hydrocephalus or pneumocephalus. The calvarium is intact.  IMPRESSION: No acute finding.  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  Atrophy and extensive chronic microvascular ischemic change.   Electronically Signed   By: Inge Rise M.D.   On: 01/26/2015 11:23   Mr Brain Wo Contrast  01/26/2015   CLINICAL DATA:  Altered mental status CT 01/01/2015, MRI 08/27/2013  EXAM: MRI HEAD WITHOUT CONTRAST  TECHNIQUE: Multiplanar, multiecho pulse sequences of the brain and surrounding structures were obtained without intravenous contrast.  COMPARISON:  None.  FINDINGS: Negative for acute infarct.  Generalized atrophy. Moderate chronic microvascular ischemic change throughout the cerebral white matter bilaterally. Chronic hemorrhage right medial thalamus with hemosiderin deposition.  Negative for mass or edema.  No shift of the midline structures.  Negative for hydrocephalus.  Pituitary normal in size.  Paranasal sinuses are clear.  IMPRESSION: Atrophy and chronic microvascular ischemia. Chronic hemorrhage right thalamus  Negative for acute infarct.   Electronically Signed   By: Franchot Gallo M.D.   On: 01/09/2015 13:37   Dg Chest Port 1 View  01/15/2015   CLINICAL DATA:  Increasing shortness of breath, question respiratory distress  EXAM: PORTABLE CHEST - 1 VIEW  COMPARISON:  01/13/2015  FINDINGS: Chronic cardiomegaly and aortic tortuosity. The patient is status post median sternotomy with aortic valve replacement. There is a persistent linear opacity in the right base which likely reflects scarring based on chest CT 11/19/2014. Low lung volumes with interstitial crowding. A hazy right upper  lobe opacity was more convincing on the previous study.  IMPRESSION: 1. Hazy right upper lobe opacity is less well seen on this study, but is  again concerning for pneumonia. When able, a two-view chest may be useful. 2. Hypoventilation with right basilar atelectasis.   Electronically Signed   By: Monte Fantasia M.D.   On: 01/15/2015 04:55   Dg Chest Portable 1 View  01/15/2015   CLINICAL DATA:  Altered mental status. History of breast cancer. Fever.  EXAM: PORTABLE CHEST - 1 VIEW  COMPARISON:  CT chest and single view of the chest 11/19/2014. PA and lateral chest 12/22/2013.  FINDINGS: Lung volumes are lower than on the comparison studies. There is cardiomegaly. Hazy airspace disease is seen over the right upper lung zone. Mild basilar atelectasis is noted.  IMPRESSION: Hazy airspace has passed and right upper lung zone worrisome for pneumonia.  Cardiomegaly without edema.   Electronically Signed   By: Inge Rise M.D.   On: 01/07/2015 10:47   Assessment/Plan Present on Admission:  . Essential hypertension . Atrial fibrillation, chronic . Sleep apnea . Aortic valve disease . Obesity . Hypothyroidism . HCAP (healthcare-associated pneumonia) . Sepsis . Acute encephalopathy  PLAN: I suspect a brainstem infarct though MRI showed no acute CVA.  Agree with comfort care as her recovery is unlikely. Will stop all labs, diagnostic tests, and start morphine drip at 35m/hour titrate to 148mper hour for comfort care.  Family is aware and at bedside.  Her death at this juncture is imminent.   Other plans as per orders.  Code Status: COMFORT CARE ONLY.   LEOrvan FalconerMD. [name] Pager [phone] to 7am.  01/15/2015, 9:48 AM

## 2015-01-15 NOTE — Patient Outreach (Signed)
Mrs. Uzelac is being followed in the community by [facility] and was making good progress in self management of diabetes. Unfortunately, she had acute mental status changes on 01/05/2015 and was admitted for urosepsis/abrupt AMS and suspicion for acute CVA. Noted family decision to make DNR and provide comfort measures. I will continue to follow her hospital progress.   [facility]  [phone]

## 2015-01-15 NOTE — Care Management Note (Signed)
    Page 1 of 1   01/15/2015     10:30:47 AM CARE MANAGEMENT NOTE 01/15/2015  Patient:  Kristin Logan, Kristin Logan   Account Number:  [id number]  Date Initiated:  01/15/2015  Documentation initiated by:  Jolene Provost  Subjective/Objective Assessment:   Pt is from home. Pt has been made comfort care. CSW aware but MD aticipates hospital dealth, <24hrs. No CM needs at this time.     Action/Plan:   Anticipated DC Date:  01/16/2015   Anticipated DC Plan:  EXPIRED  In-house referral  Clinical Social Worker      DC Planning Services  CM consult      Choice offered to / List presented to:             Status of service:  Completed, signed off Medicare Important Message given?  YES (If response is "NO", the following Medicare IM given date fields will be blank) Date Medicare IM given:  01/15/2015 Medicare IM given by:  Jolene Provost Date Additional Medicare IM given:   Additional Medicare IM given by:    Discharge Disposition:    Per UR Regulation:  Reviewed for med. necessity/level of care/duration of stay  If discussed at [place] of Stay Meetings, dates discussed:    Comments:  01/15/2015 [place], RN, MSN, CM

## 2015-01-15 NOTE — Progress Notes (Signed)
AM labs have been moved to 830 this morning depending on the plan of care for the patient.

## 2015-01-15 NOTE — Care Management Utilization Note (Signed)
UR completed 

## 2015-01-15 NOTE — Progress Notes (Signed)
Patient had episodes of bradycardia (44-60's); when possibly vagal/nausea response and expelling a mucous plug.  Patient did go back up to a increased heart rate of 120's to 140's and some runs of V-tach when trying to compensate.  The patient is having an increased effort to breathe even though urinary output has been adequate this shift.

## 2015-01-16 NOTE — Progress Notes (Signed)
[name] PROGRESS NOTE  [name]:[id number] DOB: 1932-07-10    PCP:   Vic Blackbird, MD   [id number] Kristin Logan is an 81 y.o. female with hx of prior hemorrhagic CVA, cerebral aneurysm, afib with prior coumadin use, admitted for urosepsis and abrupt AMS, suspicious for acute CVA, though the MRI did not show any acute process. She was given IV Cefepime, but did not get better.  She is not doing well and has been a DNR. Family have been at bedside, requested comfort care, and she is now on IV Morphine drip.  She appears to be in no distress.    Rewiew of Systems: Unable.   Past Medical History  Diagnosis Date  . Diabetes mellitus   . Hyperlipidemia   . Eosinophilia   . Cataract   . Hypertension   . Atrial fibrillation prior to 2008    moderate biatrial enlargement in 2009; mild to moderate LVH with a low-normal EF  . GERD (gastroesophageal reflux disease)     Hemorrhoids; history of peptic ulcer disease  . DJD (degenerative joint disease) of knee   . Chronic anticoagulation   . Aortic valve disease     2009: mild stenosis and regurgitation; peak gradient of 30-35 mmHg , subsequent AVR at [facility]  . Incontinence     Mild  . Hip fracture, left 12/12/2012  . Foot injury 06/25/2012  . Degenerative joint disease of knee 01/29/2008    Bilateral    . BREAST CANCER, HX OF 01/29/2008    Lumpectomy, radiation therapy  Arimidex stopped in July 2013    . Syncope and collapse 11/11/2012  . Diverticulosis   . Umbilical hernia   . Cholelithiasis   . DDD (degenerative disc disease), lumbar   . Vertebral compression fracture     lumbar  . Chronic pain   . Paresthesias     feet  . Gastroparesis   . Sleep apnea     CPAP  . Carcinoma of breast     Right mastectomy; radiation and hormonal therapy  . Stroke 09/2013    Hemorrhagic stroke while on coumadin  . Adrenal insufficiency 08/23/2013    On chronic prednisone  . Chronic a-fib   . Cyst, Baker's knee 09/12/2014  .  Syncope 09/11/2014  . Cellulitis of leg, right 09/11/2014    Past Surgical History  Procedure Laterality Date  . Exploration post operative open heart    . Colonoscopy      Approximately 2000  . Cardiac valve replacement      [facility]  . Cardiac valve replacement    . Breast surgery    . Breast lumpectomy    . Esophagogastroduodenoscopy N/A 02/20/2013    Procedure: ESOPHAGOGASTRODUODENOSCOPY (EGD);  Surgeon: Rogene Houston, MD;  Location: [facility];  Service: Endoscopy;  Laterality: N/A;  . Mastectomy      Medications:  HOME MEDS: Prior to Admission medications   Medication Sig Start Date End Date Taking? Authorizing Provider  acetaminophen (TYLENOL) 650 MG CR tablet Take 1,300 mg by mouth every 8 (eight) hours as needed for pain. 01/21/13  Yes Samuella Cota, MD  albuterol (PROVENTIL) (2.5 MG/3ML) 0.083% nebulizer solution Take 2.5 mg by nebulization every 6 (six) hours as needed for wheezing or shortness of breath.   Yes Historical Provider, MD  aspirin EC 81 MG tablet Take 1 tablet (81 mg total) by mouth daily. 09/01/13  Yes Abelina Bachelor, PA-C  diclofenac sodium (VOLTAREN) 1 % GEL Apply  2 g topically 4 (four) times daily as needed (arthritis pain to knees).   Yes Historical Provider, MD  furosemide (LASIX) 20 MG tablet Take 0.5 tablets (10 mg total) by mouth 2 (two) times daily. 12/02/14  Yes Alycia Rossetti, MD  gabapentin (NEURONTIN) 300 MG capsule Take 1 capsule (300 mg total) by mouth 2 (two) times daily. 12/14/14  Yes Alycia Rossetti, MD  levothyroxine (SYNTHROID, LEVOTHROID) 75 MCG tablet Take 75 mcg by mouth daily before breakfast.   Yes Historical Provider, MD  metoprolol succinate (TOPROL XL) 25 MG 24 hr tablet Take 1 tablet (25 mg total) by mouth daily. 12/02/14  Yes Alycia Rossetti, MD  nystatin (MYCOSTATIN/NYSTOP) 100000 UNIT/GM POWD Apply to affected areas three times a day Patient taking differently: Apply 1 g topically 3 (three) times daily as needed (rash). Apply to  affected areas three times a day 12/09/13  Yes Alycia Rossetti, MD  omeprazole (PRILOSEC) 20 MG capsule TAKE (1) CAPSULE BY MOUTH TWICE DAILY FOR HEARTBURN. 09/04/14  Yes Alycia Rossetti, MD  polyethylene glycol powder (GLYCOLAX/MIRALAX) powder Take 17 g by mouth daily. 06/09/14  Yes Alycia Rossetti, MD  potassium chloride (K-DUR) 10 MEQ tablet TAKE 1 TABLET BY MOUTH ONCE DAILY. 08/18/14  Yes Alycia Rossetti, MD  predniSONE (DELTASONE) 5 MG tablet Take 10 mg by mouth daily with breakfast. 10/01/13  Yes Alycia Rossetti, MD  senna-docusate (SENOKOT-S) 8.6-50 MG per tablet Take 1 tablet by mouth 2 (two) times daily. 09/15/14  Yes Lezlie Octave Black, NP  simvastatin (ZOCOR) 20 MG tablet Take 1 tablet (20 mg total) by mouth at bedtime. 12/29/14  Yes Alycia Rossetti, MD  blood glucose meter kit and supplies Dispense based on patient and insurance preference. Use to monitor FSBS 1x daily. Dx: E11.9 10/16/14   Alycia Rossetti, MD     Allergies:  Allergies  Allergen Reactions  . Coumadin [Warfarin Sodium] Other (See Comments)    Patient had ICH  . Nsaids Other (See Comments)    Bleeding ulcer  . Codeine Nausea And Vomiting  . Adhesive [Tape] Rash    Redness, and peeling skin off.     Social History:   reports that she has never smoked. She has never used smokeless tobacco. She reports that she does not drink alcohol or use illicit drugs.  Family History: Family History  Problem Relation Age of Onset  . Heart disease Mother   . Stroke Father   . Cancer Sister   . Heart disease Sister   . Stroke Sister   . Stroke Brother      Physical Exam: Filed Vitals:   01/16/15 0300 01/16/15 0400 01/16/15 0500 01/16/15 0600  BP:      Pulse: 136 146 137 136  Temp:  99.7 F (37.6 C)    TempSrc:  Rectal    Resp: '24 31 26 26  ' Height:      Weight:      SpO2: 96% 95% 93% 96%   Blood pressure 162/82, pulse 136, temperature 99.7 F (37.6 C), temperature source Rectal, resp. rate 26, height '5\' 7"'   (1.702 m), weight 118.4 kg (261 lb 0.4 oz), SpO2 96 %.  GEN:  Unresponsive.  Breasts:: Not examined CHEST: She is in agonal breathing.  HEART: Regular rate and rhythm. Tachy. There are no murmur, rub, or gallops.   BACK: No kyphosis or scoliosis; no CVA tenderness ABDOMEN: soft and non-tender; no masses, no organomegaly, normal abdominal bowel sounds;  no pannus; no intertriginous candida. There is no rebound and no distention. Rectal Exam: Not done EXTREMITIES: No bone or joint deformity; age-appropriate arthropathy of the hands and knees; no edema; no ulcerations.  There is no calf tenderness. Genitalia: not examined PULSES: 2+ and symmetric SKIN: Normal hydration no rash or ulceration CNS: not responsive.  Labs on Admission:  Basic Metabolic Panel:  Recent Labs Lab 01/16/2015 1127  NA 139  K 3.1*  CL 100  GLUCOSE 116*  BUN 12  CREATININE 1.00   Liver Function Tests: No results for input(s): AST, ALT, ALKPHOS, BILITOT, PROT, ALBUMIN in the last 168 hours. No results for input(s): LIPASE, AMYLASE in the last 168 hours. No results for input(s): AMMONIA in the last 168 hours. CBC:  Recent Labs Lab 01/03/2015 1000 01/16/2015 1127  WBC 19.5*  --   NEUTROABS 15.5*  --   HGB 15.3* 16.0*  HCT 45.9 47.0*  MCV 89.8  --   PLT 253  --    Cardiac Enzymes:  Recent Labs Lab 01/13/2015 1000 01/15/2015 2003  TROPONINI 0.03 0.11*    CBG:  Recent Labs Lab 01/12/2015 1610 01/29/2015 2155  GLUCAP 95 127*     Radiological Exams on Admission: Ct Head Wo Contrast  01/24/2015   CLINICAL DATA:  Altered mental status. Patient found unconscious and unresponsive today.  EXAM: CT HEAD WITHOUT CONTRAST  TECHNIQUE: Contiguous axial images were obtained from the base of the skull through the vertex without intravenous contrast.  COMPARISON:  Head CT scan 09/11/2014.  FINDINGS: Atrophy and extensive chronic microvascular ischemic change are identified. 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 There is no acute intracranial abnormality including hemorrhage, infarct, mass lesion, mass effect, midline shift or abnormal extra-axial fluid collection. No hydrocephalus or pneumocephalus. The calvarium is intact.  IMPRESSION: No acute finding.  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  Atrophy and extensive chronic microvascular ischemic change.   Electronically Signed   By: Inge Rise M.D.   On: 01/13/2015 11:23   Mr Brain Wo Contrast  01/23/2015   CLINICAL DATA:  Altered mental status CT 01/12/2015, MRI 08/27/2013  EXAM: MRI HEAD WITHOUT CONTRAST  TECHNIQUE: Multiplanar, multiecho pulse sequences of the brain and surrounding structures were obtained without intravenous contrast.  COMPARISON:  None.  FINDINGS: Negative for acute infarct.  Generalized atrophy. Moderate chronic microvascular ischemic change throughout the cerebral white matter bilaterally. Chronic hemorrhage right medial thalamus with hemosiderin deposition.  Negative for mass or edema.  No shift of the midline structures.  Negative for hydrocephalus.  Pituitary normal in size.  Paranasal sinuses are clear.  IMPRESSION: Atrophy and chronic microvascular ischemia. Chronic hemorrhage right thalamus  Negative for acute infarct.   Electronically Signed   By: Franchot Gallo M.D.   On: 01/21/2015 13:37   Dg Chest Port 1 View  01/15/2015   CLINICAL DATA:  Increasing shortness of breath, question respiratory distress  EXAM: PORTABLE CHEST - 1 VIEW  COMPARISON:  01/19/2015  FINDINGS: Chronic cardiomegaly and aortic tortuosity. The patient is status post median sternotomy with aortic valve replacement. There is a persistent linear opacity in the right base which likely reflects scarring based on chest CT 11/19/2014. Low lung volumes with interstitial crowding. A hazy right upper lobe opacity was more convincing on the previous study.  IMPRESSION: 1. Hazy  right upper lobe opacity is less well seen on this study, but is again concerning for pneumonia.  When able, a two-view chest may be useful. 2. Hypoventilation with right basilar atelectasis.   Electronically Signed   By: Monte Fantasia M.D.   On: 01/15/2015 04:55   Dg Chest Portable 1 View  01/01/2015   CLINICAL DATA:  Altered mental status. History of breast cancer. Fever.  EXAM: PORTABLE CHEST - 1 VIEW  COMPARISON:  CT chest and single view of the chest 11/19/2014. PA and lateral chest 12/22/2013.  FINDINGS: Lung volumes are lower than on the comparison studies. There is cardiomegaly. Hazy airspace disease is seen over the right upper lung zone. Mild basilar atelectasis is noted.  IMPRESSION: Hazy airspace has passed and right upper lung zone worrisome for pneumonia.  Cardiomegaly without edema.   Electronically Signed   By: Inge Rise M.D.   On: 01/11/2015 10:47    Assessment/Plan Present on Admission:  . Essential hypertension . Atrial fibrillation, chronic . Sleep apnea . Aortic valve disease . Obesity . Hypothyroidism . HCAP (healthcare-associated pneumonia) . Sepsis . Acute encephalopathy  PLAN:  Continue with IV morphine drip for comfort measure only.  Her death is imminent.  Family has been at bedside.   Other plans as per orders.  Code Status: DNR/CMO.    Orvan Falconer, MD. [name] Pager [phone] 7pm to 7am.  01/16/2015, 8:40 AM

## 2015-01-17 DIAGNOSIS — I359 Nonrheumatic aortic valve disorder, unspecified: Secondary | ICD-10-CM

## 2015-01-17 NOTE — Progress Notes (Signed)
Called [facility] per MD request.

## 2015-01-17 NOTE — Progress Notes (Signed)
Amy, RN with [facility] returned called.  Stated she would be by sometime today to speak with family.

## 2015-01-17 NOTE — Progress Notes (Signed)
[name] PROGRESS NOTE  Kristin Logan RCB:[id number] DOB: 1932-01-10    PCP:   Vic Blackbird, MD   [id number] Kristin Logan is an 81 y.o. female with hx of prior hemorrhagic CVA, cerebral aneurysm, afib with prior coumadin use, admitted for urosepsis and abrupt AMS, suspicious for acute CVA, though the MRI did not show any acute process. She was given IV Cefepime, but did not get better. She is not doing well and has been a DNR. Family have been at bedside, requested comfort care, and she is now on IV Morphine drip. She appears to be in no distress.  Agonal breathing.   Rewiew of Systems:  Unable.   Past Medical History  Diagnosis Date  . Diabetes mellitus   . Hyperlipidemia   . Eosinophilia   . Cataract   . Hypertension   . Atrial fibrillation prior to 2008    moderate biatrial enlargement in 2009; mild to moderate LVH with a low-normal EF  . GERD (gastroesophageal reflux disease)     Hemorrhoids; history of peptic ulcer disease  . DJD (degenerative joint disease) of knee   . Chronic anticoagulation   . Aortic valve disease     2009: mild stenosis and regurgitation; peak gradient of 30-35 mmHg , subsequent AVR at [facility]  . Incontinence     Mild  . Hip fracture, left 12/12/2012  . Foot injury 06/25/2012  . Degenerative joint disease of knee 01/29/2008    Bilateral    . BREAST CANCER, HX OF 01/29/2008    Lumpectomy, radiation therapy  Arimidex stopped in July 2013    . Syncope and collapse 11/11/2012  . Diverticulosis   . Umbilical hernia   . Cholelithiasis   . DDD (degenerative disc disease), lumbar   . Vertebral compression fracture     lumbar  . Chronic pain   . Paresthesias     feet  . Gastroparesis   . Sleep apnea     CPAP  . Carcinoma of breast     Right mastectomy; radiation and hormonal therapy  . Stroke 09/2013    Hemorrhagic stroke while on coumadin  . Adrenal insufficiency 08/23/2013    On chronic prednisone  . Chronic a-fib   . Cyst, Baker's  knee 09/12/2014  . Syncope 09/11/2014  . Cellulitis of leg, right 09/11/2014    Past Surgical History  Procedure Laterality Date  . Exploration post operative open heart    . Colonoscopy      Approximately 2000  . Cardiac valve replacement      [facility]  . Cardiac valve replacement    . Breast surgery    . Breast lumpectomy    . Esophagogastroduodenoscopy N/A 02/20/2013    Procedure: ESOPHAGOGASTRODUODENOSCOPY (EGD);  Surgeon: Rogene Houston, MD;  Location: [facility];  Service: Endoscopy;  Laterality: N/A;  . Mastectomy      Medications:  HOME MEDS: Prior to Admission medications   Medication Sig Start Date End Date Taking? Authorizing Provider  acetaminophen (TYLENOL) 650 MG CR tablet Take 1,300 mg by mouth every 8 (eight) hours as needed for pain. 01/21/13  Yes Samuella Cota, MD  albuterol (PROVENTIL) (2.5 MG/3ML) 0.083% nebulizer solution Take 2.5 mg by nebulization every 6 (six) hours as needed for wheezing or shortness of breath.   Yes Historical Provider, MD  aspirin EC 81 MG tablet Take 1 tablet (81 mg total) by mouth daily. 09/01/13  Yes Abelina Bachelor, PA-C  diclofenac sodium (VOLTAREN) 1 % GEL  Apply 2 g topically 4 (four) times daily as needed (arthritis pain to knees).   Yes Historical Provider, MD  furosemide (LASIX) 20 MG tablet Take 0.5 tablets (10 mg total) by mouth 2 (two) times daily. 12/02/14  Yes Alycia Rossetti, MD  gabapentin (NEURONTIN) 300 MG capsule Take 1 capsule (300 mg total) by mouth 2 (two) times daily. 12/14/14  Yes Alycia Rossetti, MD  levothyroxine (SYNTHROID, LEVOTHROID) 75 MCG tablet Take 75 mcg by mouth daily before breakfast.   Yes Historical Provider, MD  metoprolol succinate (TOPROL XL) 25 MG 24 hr tablet Take 1 tablet (25 mg total) by mouth daily. 12/02/14  Yes Alycia Rossetti, MD  nystatin (MYCOSTATIN/NYSTOP) 100000 UNIT/GM POWD Apply to affected areas three times a day Patient taking differently: Apply 1 g topically 3 (three) times daily as  needed (rash). Apply to affected areas three times a day 12/09/13  Yes Alycia Rossetti, MD  omeprazole (PRILOSEC) 20 MG capsule TAKE (1) CAPSULE BY MOUTH TWICE DAILY FOR HEARTBURN. 09/04/14  Yes Alycia Rossetti, MD  polyethylene glycol powder (GLYCOLAX/MIRALAX) powder Take 17 g by mouth daily. 06/09/14  Yes Alycia Rossetti, MD  potassium chloride (K-DUR) 10 MEQ tablet TAKE 1 TABLET BY MOUTH ONCE DAILY. 08/18/14  Yes Alycia Rossetti, MD  predniSONE (DELTASONE) 5 MG tablet Take 10 mg by mouth daily with breakfast. 10/01/13  Yes Alycia Rossetti, MD  senna-docusate (SENOKOT-S) 8.6-50 MG per tablet Take 1 tablet by mouth 2 (two) times daily. 09/15/14  Yes Lezlie Octave Black, NP  simvastatin (ZOCOR) 20 MG tablet Take 1 tablet (20 mg total) by mouth at bedtime. 12/29/14  Yes Alycia Rossetti, MD  blood glucose meter kit and supplies Dispense based on patient and insurance preference. Use to monitor FSBS 1x daily. Dx: E11.9 10/16/14   Alycia Rossetti, MD     Allergies:  Allergies  Allergen Reactions  . Coumadin [Warfarin Sodium] Other (See Comments)    Patient had ICH  . Nsaids Other (See Comments)    Bleeding ulcer  . Codeine Nausea And Vomiting  . Adhesive [Tape] Rash    Redness, and peeling skin off.     Social History:   reports that she has never smoked. She has never used smokeless tobacco. She reports that she does not drink alcohol or use illicit drugs.  Family History: Family History  Problem Relation Age of Onset  . Heart disease Mother   . Stroke Father   . Cancer Sister   . Heart disease Sister   . Stroke Sister   . Stroke Brother      Physical Exam: Filed Vitals:   01/17/15 0330 01/17/15 0400 01/17/15 0717 01/17/15 1100  BP:      Pulse: 122 113    Temp: 99.2 F (37.3 C) 97.9 F (36.6 C)  100.4 F (38 C)  TempSrc: Rectal Rectal    Resp: 18 20    Height:      Weight:      SpO2: 91% 86% 94%    Blood pressure 144/94, pulse 113, temperature 100.4 F (38 C),  temperature source Rectal, resp. rate 20, height '5\' 7"'  (1.702 m), weight 118.4 kg (261 lb 0.4 oz), SpO2 94 %.  Patient is not responding.  She appears comfortable.  Taking agonal breaths.    Labs on Admission:  Basic Metabolic Panel:  Recent Labs Lab 01/17/2015 1127  NA 139  K 3.1*  CL 100  GLUCOSE 116*  BUN  12  CREATININE 1.00   CBC:  Recent Labs Lab 01/06/2015 1000 01/17/2015 1127  WBC 19.5*  --   NEUTROABS 15.5*  --   HGB 15.3* 16.0*  HCT 45.9 47.0*  MCV 89.8  --   PLT 253  --    Cardiac Enzymes:  Recent Labs Lab 01/13/2015 1000 01/01/2015 2003  TROPONINI 0.03 0.11*    CBG:  Recent Labs Lab 01/09/2015 1610 01/09/2015 2155  GLUCAP 95 127*    Assessment/Plan Present on Admission:  . Essential hypertension . Atrial fibrillation, chronic . Sleep apnea . Aortic valve disease . Obesity . Hypothyroidism . HCAP (healthcare-associated pneumonia) . Sepsis . Acute encephalopathy   PLAN: Continue with IV morphine drip and comfort care.  Hospice consulted for disposition today.   Other plans as per orders.  Code Status: CMO.   Orvan Falconer, MD. [name] Pager [phone] 7pm to 7am.  01/17/2015, 11:40 AM

## 2015-01-18 ENCOUNTER — Telehealth: Payer: Self-pay | Admitting: *Deleted

## 2015-01-18 DIAGNOSIS — I6340 Cerebral infarction due to embolism of unspecified cerebral artery: Secondary | ICD-10-CM | POA: Insufficient documentation

## 2015-01-18 DIAGNOSIS — I6330 Cerebral infarction due to thrombosis of unspecified cerebral artery: Secondary | ICD-10-CM | POA: Insufficient documentation

## 2015-01-18 MED ORDER — SCOPOLAMINE 1 MG/3DAYS TD PT72: 1.0000 | MEDICATED_PATCH | TRANSDERMAL | Status: AC

## 2015-01-18 MED ORDER — ACETAMINOPHEN 650 MG RE SUPP: 650.0000 mg | RECTAL | Status: AC | PRN

## 2015-01-18 MED ORDER — ACETAMINOPHEN 650 MG RE SUPP
650.0000 mg | RECTAL | Status: DC | PRN
  Administered 2015-01-18 (×2): 650 mg via RECTAL
  Filled 2015-01-18 (×2): qty 1

## 2015-01-18 MED ORDER — MORPHINE SULFATE 25 MG/ML IV SOLN: 2.0000 mg/h | INTRAVENOUS | Status: AC

## 2015-01-18 NOTE — Discharge Summary (Signed)
Physician Discharge Summary  Kristin Logan SLP:[id number] DOB: 07-18-1932 DOA: 01/25/2015  PCP: Vic Blackbird, MD  Admit date: 01/15/2015 Discharge date: 01/15/2015  Time spent: 65 minutes  Recommendations for Outpatient Follow-up:  1. To be discharge to inpatient hospice.   Discharge Diagnoses:  Principal Problem:   Sepsis Active Problems:   Diabetes mellitus type II, controlled   Essential hypertension   Atrial fibrillation, chronic   Sleep apnea   Aortic valve disease   Obesity   Hypothyroidism   HCAP (healthcare-associated pneumonia)   Acute encephalopathy   Cerebral embolism with cerebral infarction   Cerebral thrombosis with cerebral infarction   Discharge Condition:  Critically ill.   Diet recommendation: NPO.  Filed Weights   01/07/2015 1005 01/15/15 0500  Weight: 108.863 kg (240 lb) 118.4 kg (261 lb 0.4 oz)    History of present illness:  Patient was admitted by me on 01/24/2015 for altered mental status.  As per my H and P:  " 81 yo with hx of prior hemorrhagic CVA, cerebral aneurysm, hx of afib previously on Coumadin, hx of carotid stenosis, living independently at home, found unconscious by her nursing aide this am. She was found to have possible sepsis, and PNA, with CT and MRI of her head showing no acute process. She however, is not responding, and clinically presented as if she had a CVA. Will admit her to SDU, start to tx sepsis, and follow her response. She is a DNR and this was confirmed with her family.  Hospital Course:  Patient was admitted into the ICU, and it was felt that she had an Acute CVA, suspicious that she may have had a brain stem infarct.  The MRI did not show any acute stroke however.  She was treated for PNA.  She continued to deteriorate, and never became responsive.  Family had expressed desire originally for DNR, however, wished to change her care to comfort measure only.  She has been in agonal breathing, but with the morphine drip,  she appears to be comfortable.  Her death is imminent, and her family has all been at her bedside.  Hospice care consulted, and had kindly accepted her for inpatient terminal care.  It has been a pleasure taking care of this patient and her family.  Thank you for allowing me to participate in her care.    Discharge Exam: Filed Vitals:   01/22/2015 0600  BP:   Pulse: 123  Temp:   Resp: 33    Discharge Instructions   Discharge Instructions    Discharge instructions    Complete by:  As directed           Current Discharge Medication List    START taking these medications   Details  acetaminophen (TYLENOL) 650 MG suppository Place 1 suppository (650 mg total) rectally every 4 (four) hours as needed for fever. Qty: 12 suppository, Refills: 0    morphine 250 mg in dextrose 5 % 250 mL Inject 2 mg/hr into the vein continuous. Qty: 250 mL, Refills: 2    scopolamine (TRANSDERM-SCOP) 1 MG/3DAYS Place 1 patch (1.5 mg total) onto the skin every 3 (three) days. Qty: 10 patch, Refills: 12      STOP taking these medications     acetaminophen (TYLENOL) 650 MG CR tablet      albuterol (PROVENTIL) (2.5 MG/3ML) 0.083% nebulizer solution      aspirin EC 81 MG tablet      diclofenac sodium (VOLTAREN) 1 % GEL  furosemide (LASIX) 20 MG tablet      gabapentin (NEURONTIN) 300 MG capsule      levothyroxine (SYNTHROID, LEVOTHROID) 75 MCG tablet      metoprolol succinate (TOPROL XL) 25 MG 24 hr tablet      nystatin (MYCOSTATIN/NYSTOP) 100000 UNIT/GM POWD      omeprazole (PRILOSEC) 20 MG capsule      polyethylene glycol powder (GLYCOLAX/MIRALAX) powder      potassium chloride (K-DUR) 10 MEQ tablet      predniSONE (DELTASONE) 5 MG tablet      senna-docusate (SENOKOT-S) 8.6-50 MG per tablet      simvastatin (ZOCOR) 20 MG tablet      blood glucose meter kit and supplies        Allergies  Allergen Reactions  . Coumadin [Warfarin Sodium] Other (See Comments)    Patient had  ICH  . Nsaids Other (See Comments)    Bleeding ulcer  . Codeine Nausea And Vomiting  . Adhesive [Tape] Rash    Redness, and peeling skin off.       The results of significant diagnostics from this hospitalization (including imaging, microbiology, ancillary and laboratory) are listed below for reference.    Significant Diagnostic Studies: Ct Head Wo Contrast  01/28/2015   CLINICAL DATA:  Altered mental status. Patient found unconscious and unresponsive today.  EXAM: CT HEAD WITHOUT CONTRAST  TECHNIQUE: Contiguous axial images were obtained from the base of the skull through the vertex without intravenous contrast.  COMPARISON:  Head CT scan 09/11/2014.  FINDINGS: Atrophy and extensive chronic microvascular ischemic change are identified. 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 There is no acute intracranial abnormality including hemorrhage, infarct, mass lesion, mass effect, midline shift or abnormal extra-axial fluid collection. No hydrocephalus or pneumocephalus. The calvarium is intact.  IMPRESSION: No acute finding.  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  Atrophy and extensive chronic microvascular ischemic change.   Electronically Signed   By: Inge Rise M.D.   On: 01/26/2015 11:23   Mr Brain Wo Contrast  01/08/2015   CLINICAL DATA:  Altered mental status CT 01/09/2015, MRI 08/27/2013  EXAM: MRI HEAD WITHOUT CONTRAST  TECHNIQUE: Multiplanar, multiecho pulse sequences of the brain and surrounding structures were obtained without intravenous contrast.  COMPARISON:  None.  FINDINGS: Negative for acute infarct.  Generalized atrophy. Moderate chronic microvascular ischemic change throughout the cerebral white matter bilaterally. Chronic hemorrhage right medial thalamus with hemosiderin deposition.  Negative for mass or edema.  No shift of the midline structures.  Negative for hydrocephalus.   Pituitary normal in size.  Paranasal sinuses are clear.  IMPRESSION: Atrophy and chronic microvascular ischemia. Chronic hemorrhage right thalamus  Negative for acute infarct.   Electronically Signed   By: Franchot Gallo M.D.   On: 01/12/2015 13:37   Dg Chest Port 1 View  01/15/2015   CLINICAL DATA:  Increasing shortness of breath, question respiratory distress  EXAM: PORTABLE CHEST - 1 VIEW  COMPARISON:  01/23/2015  FINDINGS: Chronic cardiomegaly and aortic tortuosity. The patient is status post median sternotomy with aortic valve replacement. There is a persistent linear opacity in the right base which likely reflects scarring based on chest CT 11/19/2014. Low lung volumes with interstitial crowding. A hazy right upper lobe opacity was more convincing on the previous study.  IMPRESSION: 1. Hazy right upper lobe opacity is less well seen on this study, but is again concerning for pneumonia. When  able, a two-view chest may be useful. 2. Hypoventilation with right basilar atelectasis.   Electronically Signed   By: Monte Fantasia M.D.   On: 01/15/2015 04:55   Dg Chest Portable 1 View  01/17/2015   CLINICAL DATA:  Altered mental status. History of breast cancer. Fever.  EXAM: PORTABLE CHEST - 1 VIEW  COMPARISON:  CT chest and single view of the chest 11/19/2014. PA and lateral chest 12/22/2013.  FINDINGS: Lung volumes are lower than on the comparison studies. There is cardiomegaly. Hazy airspace disease is seen over the right upper lung zone. Mild basilar atelectasis is noted.  IMPRESSION: Hazy airspace has passed and right upper lung zone worrisome for pneumonia.  Cardiomegaly without edema.   Electronically Signed   By: Inge Rise M.D.   On: 01/02/2015 10:47    Microbiology: Recent Results (from the past 240 hour(s))  Blood culture (routine x 2)     Status: None (Preliminary result)   Collection Time: 01/08/2015 10:53 AM  Result Value Ref Range Status   Specimen Description BLOOD LEFT ANTECUBITAL   Final   Special Requests   Final    BOTTLES DRAWN AEROBIC AND ANAEROBIC AEB=6CC ANA=4CC   Culture NO GROWTH 4 DAYS  Final   Report Status PENDING  Incomplete  Blood culture (routine x 2)     Status: None (Preliminary result)   Collection Time: 01/06/2015 11:01 AM  Result Value Ref Range Status   Specimen Description BLOOD LEFT HAND  Final   Special Requests   Final    BOTTLES DRAWN AEROBIC AND ANAEROBIC AEB=6CC ANA=4CC   Culture NO GROWTH 4 DAYS  Final   Report Status PENDING  Incomplete  MRSA PCR Screening     Status: None   Collection Time: 01/29/2015  3:22 PM  Result Value Ref Range Status   MRSA by PCR NEGATIVE NEGATIVE Final    Comment:        The GeneXpert MRSA Assay (FDA approved for NASAL specimens only), is one component of a comprehensive MRSA colonization surveillance program. It is not intended to diagnose MRSA infection nor to guide or monitor treatment for MRSA infections.      Labs: Basic Metabolic Panel:  Recent Labs Lab 01/13/2015 1127  NA 139  K 3.1*  CL 100  GLUCOSE 116*  BUN 12  CREATININE 1.00    Recent Labs Lab 01/13/2015 1000 01/02/2015 1127  WBC 19.5*  --   NEUTROABS 15.5*  --   HGB 15.3* 16.0*  HCT 45.9 47.0*  MCV 89.8  --   PLT 253  --    Cardiac Enzymes:  Recent Labs Lab 01/13/2015 1000 01/23/2015 2003  TROPONINI 0.03 0.11*   BNP: BNP (last 3 results)  Recent Labs  11/19/14 1350 01/22/2015 1000  BNP 165.0* 182.0*    ProBNP (last 3 results) No results for input(s): PROBNP in the last 8760 hours.  CBG:  Recent Labs Lab 01/15/2015 1610 01/11/2015 2155  GLUCAP 95 127*       Signed:  Juvencio Verdi  [name] 01/06/2015, 11:53 AM

## 2015-01-18 NOTE — Clinical Documentation Improvement (Signed)
Possible Clinical Conditions?  Acute Respiratory Failure Acute on Chronic Respiratory Failure Chronic Respiratory Failure Other Condition Cannot Clinically Determine   Supporting Information:(As per notes) Risk Factors:Sepsis & HCAP  Signs & Symptoms: Sats dipping in 80's  Treatment:Venturi Mask  Thank You, Alessandra Grout, RN, BSN, CCDS,Clinical Documentation Specialist:  [phone]  [phone]=[employer]- Health Information Management

## 2015-01-18 NOTE — Progress Notes (Signed)
Paged Fredirick Maudlin , NP for temperature of 101.8 axillary with no Tylenol ordered; replied at [facility] and orders received for Tylenol supp.

## 2015-01-18 NOTE — Progress Notes (Signed)
Patient expired at 1418. Family at bedside. Death confirmed by Sharene Skeans RN. Dr Susanne Borders notified, as well as [facility].

## 2015-01-18 NOTE — Telephone Encounter (Signed)
Called to reschedule the appt, daughter-in-law notified me of pts death. Will notify Dr. Leonie Man

## 2015-01-19 ENCOUNTER — Encounter: Payer: Self-pay | Admitting: *Deleted

## 2015-01-19 LAB — CULTURE, BLOOD (ROUTINE X 2)
Culture: NO GROWTH
Culture: NO GROWTH

## 2015-01-19 NOTE — Clinical Social Work Note (Signed)
Late Entry for 01/09/2015  CSW sent clinicals to [facility] [place].  CSW met with patient's three daughters.  CSW provided supportive counseling.  CSW advised that the ambulance had been arranged to pick patient up at 4:30 for [facility].    CSW signing off.     Ambrose Pancoast, Ken Caryl

## 2015-01-19 NOTE — Patient Outreach (Signed)
[facility] Management aware that patient is deceased. Discharged from [facility] services.

## 2015-01-19 NOTE — Care Management Utilization Note (Signed)
UR completed 

## 2015-01-21 ENCOUNTER — Ambulatory Visit: Payer: Medicare Other | Admitting: *Deleted

## 2015-01-31 DEATH — deceased

## 2015-02-23 ENCOUNTER — Ambulatory Visit: Payer: Medicare Other | Admitting: Neurology

## 2015-03-16 ENCOUNTER — Ambulatory Visit: Payer: Medicare Other | Admitting: Family Medicine
